# Patient Record
Sex: Female | Born: 1975 | Hispanic: Yes | Smoking: Light tobacco smoker
Health system: Southern US, Community
[De-identification: ages and names within clinical notes are randomized; demographics above are authoritative.]

## PROBLEM LIST (undated history)

## (undated) DIAGNOSIS — M869 Osteomyelitis, unspecified: Secondary | ICD-10-CM

## (undated) DIAGNOSIS — R0600 Dyspnea, unspecified: Secondary | ICD-10-CM

## (undated) DIAGNOSIS — H5440 Blindness, one eye, unspecified eye: Secondary | ICD-10-CM

## (undated) DIAGNOSIS — N289 Disorder of kidney and ureter, unspecified: Secondary | ICD-10-CM

## (undated) DIAGNOSIS — I10 Essential (primary) hypertension: Secondary | ICD-10-CM

## (undated) DIAGNOSIS — G4730 Sleep apnea, unspecified: Secondary | ICD-10-CM

## (undated) DIAGNOSIS — D649 Anemia, unspecified: Secondary | ICD-10-CM

## (undated) DIAGNOSIS — K219 Gastro-esophageal reflux disease without esophagitis: Secondary | ICD-10-CM

## (undated) DIAGNOSIS — F909 Attention-deficit hyperactivity disorder, unspecified type: Secondary | ICD-10-CM

## (undated) DIAGNOSIS — R911 Solitary pulmonary nodule: Secondary | ICD-10-CM

## (undated) DIAGNOSIS — M797 Fibromyalgia: Secondary | ICD-10-CM

## (undated) DIAGNOSIS — E119 Type 2 diabetes mellitus without complications: Secondary | ICD-10-CM

## (undated) DIAGNOSIS — J45909 Unspecified asthma, uncomplicated: Secondary | ICD-10-CM

## (undated) DIAGNOSIS — M1990 Unspecified osteoarthritis, unspecified site: Secondary | ICD-10-CM

## (undated) DIAGNOSIS — Q600 Renal agenesis, unilateral: Secondary | ICD-10-CM

## (undated) DIAGNOSIS — G629 Polyneuropathy, unspecified: Secondary | ICD-10-CM

## (undated) HISTORY — PX: URETER SURGERY: SHX823

## (undated) HISTORY — PX: APPENDECTOMY (OPEN): SHX54

## (undated) HISTORY — DX: Renal agenesis, unilateral: Q60.0

## (undated) HISTORY — PX: CHOLECYSTECTOMY: SHX55

## (undated) HISTORY — DX: Type 2 diabetes mellitus without complications: E11.9

## (undated) HISTORY — PX: BRAIN SURGERY: SHX531

## (undated) HISTORY — DX: Polyneuropathy, unspecified: G62.9

## (undated) HISTORY — PX: TUBAL LIGATION: SHX77

## (undated) HISTORY — PX: WISDOM TOOTH EXTRACTION: SHX21

## (undated) HISTORY — PX: APPENDECTOMY: SHX54

## (undated) HISTORY — PX: EYE SURGERY: SHX253

## (undated) HISTORY — PX: OTHER SURGICAL HISTORY: SHX169

## (undated) HISTORY — PX: TRIGGER FINGER RELEASE: SHX641

## (undated) HISTORY — PX: COLON SURGERY: SHX602

## (undated) HISTORY — PX: AV FISTULA PLACEMENT: SHX1204

---
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
## 1996-01-18 ENCOUNTER — Ambulatory Visit
Admission: RE | Admit: 1996-01-18 | Disposition: A | Discharge status: Home / Self Care | Payer: Self-pay | Source: Emergency Department | Admitting: Obstetrics & Gynecology

## 1996-01-19 ENCOUNTER — Ambulatory Visit (HOSPITAL_BASED_OUTPATIENT_CLINIC_OR_DEPARTMENT_OTHER)
Admit: 1996-01-19 | Disposition: A | Discharge status: Home / Self Care | Payer: Self-pay | Source: Ambulatory Visit | Admitting: Obstetrics & Gynecology

## 1996-01-26 ENCOUNTER — Inpatient Hospital Stay (HOSPITAL_BASED_OUTPATIENT_CLINIC_OR_DEPARTMENT_OTHER)
Admission: EM | Admit: 1996-01-26 | Disposition: A | Discharge status: Home / Self Care | Payer: Self-pay | Source: Ambulatory Visit | Admitting: Obstetrics & Gynecology

## 1996-03-16 ENCOUNTER — Ambulatory Visit (HOSPITAL_BASED_OUTPATIENT_CLINIC_OR_DEPARTMENT_OTHER)
Admit: 1996-03-16 | Disposition: A | Discharge status: Home / Self Care | Payer: Self-pay | Source: Ambulatory Visit | Admitting: Obstetrics & Gynecology

## 1996-04-19 ENCOUNTER — Inpatient Hospital Stay (HOSPITAL_BASED_OUTPATIENT_CLINIC_OR_DEPARTMENT_OTHER)
Admit: 1996-04-19 | Disposition: A | Discharge status: Home / Self Care | Payer: Self-pay | Source: Ambulatory Visit | Admitting: Obstetrics & Gynecology

## 1996-04-20 ENCOUNTER — Inpatient Hospital Stay (HOSPITAL_BASED_OUTPATIENT_CLINIC_OR_DEPARTMENT_OTHER)
Admission: RE | Admit: 1996-04-20 | Disposition: A | Discharge status: Home / Self Care | Payer: Self-pay | Source: Ambulatory Visit | Admitting: Obstetrics & Gynecology

## 1997-07-04 ENCOUNTER — Emergency Department: Admit: 1997-07-04 | Payer: Self-pay | Admitting: Emergency Medicine

## 1997-07-04 ENCOUNTER — Inpatient Hospital Stay
Admission: EM | Admit: 1997-07-04 | Disposition: A | Discharge status: Home / Self Care | Payer: Self-pay | Source: Emergency Department | Admitting: Specialist

## 1997-10-02 ENCOUNTER — Emergency Department: Admit: 1997-10-02 | Payer: Self-pay | Admitting: Surgery

## 1998-01-18 ENCOUNTER — Emergency Department: Admit: 1998-01-18 | Payer: Self-pay

## 1998-02-23 ENCOUNTER — Emergency Department: Admit: 1998-02-23 | Payer: Self-pay | Admitting: Emergency Medicine

## 1998-04-23 ENCOUNTER — Emergency Department: Admit: 1998-04-23 | Payer: Self-pay

## 1998-05-15 ENCOUNTER — Emergency Department: Admit: 1998-05-15 | Payer: Self-pay | Admitting: Emergency Medicine

## 1998-06-27 ENCOUNTER — Emergency Department: Admit: 1998-06-27 | Payer: Self-pay

## 1998-07-03 ENCOUNTER — Ambulatory Visit: Admit: 1998-07-03 | Disposition: A | Discharge status: Home / Self Care | Payer: Self-pay | Source: Ambulatory Visit

## 1998-07-10 ENCOUNTER — Inpatient Hospital Stay (INDEPENDENT_AMBULATORY_CARE_PROVIDER_SITE_OTHER): Admit: 1998-07-10 | Disposition: A | Discharge status: Home / Self Care | Payer: Self-pay | Source: Ambulatory Visit

## 1998-08-31 ENCOUNTER — Emergency Department: Admit: 1998-08-31 | Payer: Self-pay

## 1998-09-02 ENCOUNTER — Emergency Department: Admit: 1998-09-02 | Payer: Self-pay | Source: Emergency Department | Admitting: Pediatric Emergency Medicine

## 1998-10-08 ENCOUNTER — Ambulatory Visit
Admit: 1998-10-08 | Disposition: A | Discharge status: Home / Self Care | Payer: Self-pay | Admitting: Obstetrics & Gynecology

## 1998-11-23 ENCOUNTER — Emergency Department: Admit: 1998-11-23 | Payer: Self-pay | Admitting: Surgery

## 1998-11-28 ENCOUNTER — Ambulatory Visit
Admit: 1998-11-28 | Disposition: A | Discharge status: Home / Self Care | Payer: Self-pay | Admitting: Obstetrics & Gynecology

## 1998-12-15 ENCOUNTER — Ambulatory Visit
Admit: 1998-12-15 | Disposition: A | Discharge status: Home / Self Care | Payer: Self-pay | Admitting: Obstetrics & Gynecology

## 1998-12-24 ENCOUNTER — Ambulatory Visit
Admit: 1998-12-24 | Disposition: A | Discharge status: Home / Self Care | Payer: Self-pay | Admitting: Obstetrics & Gynecology

## 1998-12-28 ENCOUNTER — Ambulatory Visit
Admit: 1998-12-28 | Disposition: A | Discharge status: Home / Self Care | Payer: Self-pay | Admitting: Obstetrics & Gynecology

## 1999-01-03 ENCOUNTER — Ambulatory Visit
Admit: 1999-01-03 | Disposition: A | Discharge status: Home / Self Care | Payer: Self-pay | Admitting: Obstetrics & Gynecology

## 1999-01-07 ENCOUNTER — Ambulatory Visit
Admit: 1999-01-07 | Disposition: A | Discharge status: Home / Self Care | Payer: Self-pay | Admitting: Obstetrics & Gynecology

## 1999-01-12 ENCOUNTER — Ambulatory Visit
Admit: 1999-01-12 | Disposition: A | Discharge status: Home / Self Care | Payer: Self-pay | Admitting: Obstetrics & Gynecology

## 1999-01-14 ENCOUNTER — Ambulatory Visit
Admit: 1999-01-14 | Disposition: A | Discharge status: Home / Self Care | Payer: Self-pay | Admitting: Obstetrics & Gynecology

## 1999-01-17 ENCOUNTER — Inpatient Hospital Stay
Admit: 1999-01-17 | Disposition: A | Discharge status: Home / Self Care | Payer: Self-pay | Admitting: Obstetrics & Gynecology

## 1999-01-24 ENCOUNTER — Emergency Department: Admit: 1999-01-24 | Payer: Self-pay

## 1999-02-13 ENCOUNTER — Emergency Department: Admit: 1999-02-13 | Payer: Self-pay

## 1999-03-13 ENCOUNTER — Ambulatory Visit
Admit: 1999-03-13 | Disposition: A | Discharge status: Home / Self Care | Payer: Self-pay | Admitting: Obstetrics & Gynecology

## 1999-09-19 ENCOUNTER — Emergency Department: Admit: 1999-09-19 | Payer: Self-pay | Admitting: Emergency Medicine

## 2000-01-16 ENCOUNTER — Emergency Department: Admit: 2000-01-16 | Payer: Self-pay | Admitting: Emergency Medicine

## 2000-02-18 ENCOUNTER — Emergency Department: Admit: 2000-02-18 | Payer: Self-pay

## 2000-03-30 ENCOUNTER — Emergency Department: Admit: 2000-03-30 | Payer: Self-pay | Admitting: Emergency Medicine

## 2000-05-27 ENCOUNTER — Emergency Department: Admit: 2000-05-27 | Payer: Self-pay

## 2000-06-22 ENCOUNTER — Emergency Department: Admit: 2000-06-22 | Payer: Self-pay | Source: Emergency Department | Admitting: Emergency Medicine

## 2000-10-11 ENCOUNTER — Emergency Department: Admit: 2000-10-11 | Payer: Self-pay | Source: Emergency Department | Admitting: Emergency Medicine

## 2001-04-03 ENCOUNTER — Emergency Department: Admit: 2001-04-03 | Payer: Self-pay | Source: Emergency Department | Admitting: Emergency Medicine

## 2001-04-03 ENCOUNTER — Emergency Department: Admit: 2001-04-03 | Payer: Self-pay | Source: Emergency Department

## 2004-02-01 ENCOUNTER — Inpatient Hospital Stay
Admission: EM | Admit: 2004-02-01 | Disposition: A | Discharge status: Hospice | Payer: Self-pay | Source: Emergency Department | Admitting: Specialist

## 2006-12-08 ENCOUNTER — Emergency Department: Admit: 2006-12-08 | Payer: Self-pay | Source: Emergency Department | Admitting: Emergency Medicine

## 2006-12-09 LAB — MAGNESIUM: Magnesium: 1.3 mg/dL — ABNORMAL LOW (ref 1.6–2.3)

## 2006-12-09 LAB — BASIC METABOLIC PANEL
BUN: 19 mg/dL (ref 8–20)
BUN: 25 mg/dL — ABNORMAL HIGH (ref 8–20)
CO2: 21 mEq/L (ref 21–30)
CO2: 25 mEq/L (ref 21–30)
Calcium: 10 mg/dL (ref 8.6–10.2)
Calcium: 8.6 mg/dL (ref 8.6–10.2)
Chloride: 100 mEq/L (ref 98–107)
Chloride: 107 mEq/L (ref 98–107)
Creatinine: 0.5 mg/dL — ABNORMAL LOW (ref 0.6–1.5)
Creatinine: 0.6 mg/dL (ref 0.6–1.5)
Glucose: 336 mg/dL — ABNORMAL HIGH (ref 70–100)
Glucose: 488 mg/dL — ABNORMAL HIGH (ref 70–100)
Potassium: 3.9 mEq/L (ref 3.6–5.0)
Potassium: 4.2 mEq/L (ref 3.6–5.0)
Sodium: 135 mEq/L — ABNORMAL LOW (ref 136–146)
Sodium: 136 mEq/L (ref 136–146)

## 2006-12-09 LAB — URINALYSIS WITH MICROSCOPIC
Bilirubin, UA: NEGATIVE
Blood, UA: NEGATIVE
Glucose, UA: >=1000
Leukocyte Esterase, UA: NEGATIVE
Nitrite, UA: POSITIVE
Protein, UR: NEGATIVE
Specific Gravity UA POCT: 1.025 (ref 1.001–1.035)
Urine pH: 5.5 (ref 5.0–8.0)
Urobilinogen, UA: 0.2

## 2006-12-09 LAB — CBC WITH AUTO DIFFERENTIAL CERNER
Basophils Absolute: 0 /mm3 (ref 0.0–0.2)
Basophils Absolute: 0 /mm3 (ref 0.0–0.2)
Basophils: 0 % (ref 0–2)
Basophils: 0 % (ref 0–2)
Eosinophils Absolute: 0.3 /mm3 (ref 0.0–0.7)
Eosinophils Absolute: 0.3 /mm3 (ref 0.0–0.7)
Eosinophils: 4 % (ref 0–5)
Eosinophils: 4 % (ref 0–5)
Granulocytes Absolute: 3.4 /mm3 (ref 1.8–8.1)
Granulocytes Absolute: 3.7 /mm3 (ref 1.8–8.1)
Hematocrit: 36.9 % — ABNORMAL LOW (ref 37.0–47.0)
Hematocrit: 41.5 % (ref 37.0–47.0)
Hgb: 13 G/DL (ref 12.0–16.0)
Hgb: 14.7 G/DL (ref 12.0–16.0)
Lymphocytes Absolute: 3.7 /mm3 (ref 0.5–4.4)
Lymphocytes Absolute: 3.8 /mm3 (ref 0.5–4.4)
Lymphocytes: 46 % — ABNORMAL HIGH (ref 15–41)
Lymphocytes: 47 % — ABNORMAL HIGH (ref 15–41)
MCH: 29.7 PG (ref 28.0–32.0)
MCH: 30.1 PG (ref 28.0–32.0)
MCHC: 35.2 G/DL (ref 32.0–36.0)
MCHC: 35.3 G/DL (ref 32.0–36.0)
MCV: 84 FL (ref 80.0–100.0)
MCV: 85.6 FL (ref 80.0–100.0)
MPV: 7.8 FL (ref 7.4–10.4)
MPV: 8 FL (ref 7.4–10.4)
Monocytes Absolute: 0.4 /mm3 (ref 0.0–1.2)
Monocytes Absolute: 0.5 /mm3 (ref 0.0–1.2)
Monocytes: 5 % (ref 0–11)
Monocytes: 6 % (ref 0–11)
Neutrophils %: 42 % — ABNORMAL LOW (ref 52–75)
Neutrophils %: 45 % — ABNORMAL LOW (ref 52–75)
Platelets: 270 /mm3 (ref 140–400)
Platelets: 310 /mm3 (ref 140–400)
RBC: 4.31 /mm3 (ref 4.20–5.40)
RBC: 4.95 /mm3 (ref 4.20–5.40)
RDW: 12 % (ref 11.5–15.0)
RDW: 12.1 % (ref 11.5–15.0)
WBC: 8.1 /mm3 (ref 3.5–10.8)
WBC: 8.2 /mm3 (ref 3.5–10.8)

## 2006-12-09 LAB — ACETONE: Acetone Blood: 1:2 {titer}

## 2006-12-09 LAB — PHOSPHORUS: Phosphorus: 4.7 mg/dL — ABNORMAL HIGH (ref 2.5–4.5)

## 2006-12-09 LAB — GFR

## 2006-12-09 LAB — HCG QUANTITATIVE LEVEL - FH CERNER: TUMOR MARKER BHCG QUANT: 0 m[IU]/mL

## 2008-03-22 ENCOUNTER — Emergency Department: Admit: 2008-03-22 | Payer: Self-pay | Source: Emergency Department

## 2011-09-06 LAB — ECG 12-LEAD
Atrial Rate: 72 {beats}/min
P Axis: 69 degrees
P-R Interval: 178 ms
Q-T Interval: 406 ms
QRS Duration: 90 ms
QTC Calculation (Bezet): 444 ms
R Axis: 36 degrees
T Axis: 60 degrees
Ventricular Rate: 72 {beats}/min

## 2012-12-03 NOTE — Procedures (Unsigned)
PATIENT NAME:  Caroline Christensen, Caroline Christensen            MED REC NO:      [id number]      ORDERING MD:   Lavonna Rua, MD             EXAM DATE:       02/03/2004      DICTATING MD:  Carma Leaven, MD          ACCOUNT NO:     [id number]      PATIENT DOB:     1976-07-30                  PATIENT LOC:     [id number]                  EXAMINATION: M-MODE, 2-D, DOPPLER AND COLOR FLOW DOPPLER ECHOCARDIOGRAM.            CLINICAL INDICATION: Heart murmur, diabetes.            INTERPRETATION: Dimensions: RV 2.2 cm. Aorta 2.9 cm. ACS 1.8 cm. LA 2.9 cm.      LVIDD 3.9 cm. LVIDS 2.9 cm. IVS 1 cm. LVPW 1 cm. LV ejection fraction 60%.            Comments      1.   Normal LV dimension in systole and diastole. Wall thickness is normal.      There is no wall motion abnormality. EF is normal at 60%.      2.   The right ventricular dimension is normal with mild tricuspid      regurgitation. Normal pulmonary artery pressure.      3.   Anatomically normal mitral valve with trace mitral regurgitation.      Normal left atrial dimension.      4.   Aortic valve anatomically and functionally intact.            FINAL IMPRESSION/ASSESSMENT: Essentially normal echo-Doppler study.                        __________________________________      Carma Leaven, MD            [id number]      D: 02/03/2004  3:10 P      T: 02/05/2004 11:17 A      J: [id number]      N: [id number]      CC:   Carma Leaven, MD

## 2015-04-02 ENCOUNTER — Emergency Department
Admission: EM | Admit: 2015-04-02 | Discharge: 2015-04-02 | Disposition: A | Discharge status: Home / Self Care | Payer: Medicaid HMO | Attending: Emergency Medical Services | Admitting: Emergency Medical Services

## 2015-04-02 ENCOUNTER — Emergency Department: Payer: Self-pay

## 2015-04-02 ENCOUNTER — Emergency Department: Payer: Medicaid HMO

## 2015-04-02 DIAGNOSIS — E1140 Type 2 diabetes mellitus with diabetic neuropathy, unspecified: Secondary | ICD-10-CM | POA: Insufficient documentation

## 2015-04-02 DIAGNOSIS — N390 Urinary tract infection, site not specified: Secondary | ICD-10-CM | POA: Insufficient documentation

## 2015-04-02 DIAGNOSIS — E1169 Type 2 diabetes mellitus with other specified complication: Secondary | ICD-10-CM

## 2015-04-02 DIAGNOSIS — E119 Type 2 diabetes mellitus without complications: Secondary | ICD-10-CM | POA: Insufficient documentation

## 2015-04-02 DIAGNOSIS — Z590 Homelessness: Secondary | ICD-10-CM | POA: Insufficient documentation

## 2015-04-02 DIAGNOSIS — Z794 Long term (current) use of insulin: Secondary | ICD-10-CM | POA: Insufficient documentation

## 2015-04-02 DIAGNOSIS — M79671 Pain in right foot: Secondary | ICD-10-CM | POA: Insufficient documentation

## 2015-04-02 DIAGNOSIS — F17210 Nicotine dependence, cigarettes, uncomplicated: Secondary | ICD-10-CM | POA: Insufficient documentation

## 2015-04-02 LAB — URINALYSIS, REFLEX TO MICROSCOPIC EXAM IF INDICATED
Bilirubin, UA: NEGATIVE
Glucose, UA: >=500 — AB
Ketones UA: NEGATIVE
Nitrite, UA: NEGATIVE
Protein, UR: >=500 — AB
Specific Gravity UA: 1.015 (ref 1.001–1.035)
Urine pH: 6 (ref 5.0–8.0)
Urobilinogen, UA: NORMAL mg/dL

## 2015-04-02 LAB — CBC AND DIFFERENTIAL
Hematocrit: 29 % — ABNORMAL LOW (ref 37.0–47.0)
Hgb: 10.2 g/dL — ABNORMAL LOW (ref 12.0–16.0)
MCH: 27.7 pg — ABNORMAL LOW (ref 28.0–32.0)
MCHC: 35.2 g/dL (ref 32.0–36.0)
MCV: 78.8 fL — ABNORMAL LOW (ref 80.0–100.0)
MPV: 9.9 fL (ref 9.4–12.3)
Platelets: 328 10*3/uL (ref 140–400)
RBC: 3.68 10*6/uL — ABNORMAL LOW (ref 4.20–5.40)
RDW: 14 % (ref 12–15)
WBC: 12.56 10*3/uL — ABNORMAL HIGH (ref 3.50–10.80)

## 2015-04-02 LAB — MAN DIFF ONLY
Band Neutrophils Absolute: 0.75 10*3/uL (ref 0.00–1.00)
Band Neutrophils: 6 %
Basophils Absolute Manual: 0.38 10*3/uL — ABNORMAL HIGH (ref 0.00–0.20)
Basophils Manual: 3 %
Eosinophils Absolute Manual: 0.25 10*3/uL (ref 0.00–0.70)
Eosinophils Manual: 2 %
Lymphocytes Absolute Manual: 3.27 10*3/uL (ref 0.50–4.40)
Lymphocytes Manual: 26 %
Monocytes Absolute: 0.63 10*3/uL (ref 0.00–1.20)
Monocytes Manual: 5 %
Neutrophils Absolute Manual: 7.28 10*3/uL (ref 1.80–8.10)
Nucleated RBC: 0 /100 WBC (ref 0–1)
Segmented Neutrophils: 58 %

## 2015-04-02 LAB — GFR: EGFR: >60

## 2015-04-02 LAB — BASIC METABOLIC PANEL
Anion Gap: 7 (ref 5.0–15.0)
BUN: 26 mg/dL — ABNORMAL HIGH (ref 7–19)
CO2: 25 mEq/L (ref 22–29)
Calcium: 8.8 mg/dL (ref 8.5–10.5)
Chloride: 97 mEq/L — ABNORMAL LOW (ref 100–111)
Creatinine: 1 mg/dL (ref 0.6–1.0)
Glucose: 536 mg/dL (ref 70–100)
Potassium: 4.1 mEq/L (ref 3.5–5.1)
Sodium: 129 mEq/L — ABNORMAL LOW (ref 136–145)

## 2015-04-02 LAB — GLUCOSE WHOLE BLOOD - POCT
Whole Blood Glucose POCT: 439 mg/dL — ABNORMAL HIGH (ref 70–100)
Whole Blood Glucose POCT: 343 mg/dL — ABNORMAL HIGH (ref 70–100)

## 2015-04-02 LAB — CELL MORPHOLOGY
Cell Morphology: NORMAL
Platelet Estimate: ADEQUATE

## 2015-04-02 MED ORDER — CIPROFLOXACIN HCL 500 MG PO TABS
500.0000 mg | ORAL_TABLET | Freq: Once | ORAL | Status: AC
Start: 2015-04-02 — End: 2015-04-02
  Administered 2015-04-02: 500 mg via ORAL
  Filled 2015-04-02: qty 1

## 2015-04-02 MED ORDER — ONDANSETRON 4 MG PO TBDP
4.0000 mg | ORAL_TABLET | Freq: Three times a day (TID) | ORAL | Status: AC | PRN
Start: 2015-04-02 — End: ?

## 2015-04-02 MED ORDER — CIPROFLOXACIN HCL 500 MG PO TABS
500.0000 mg | ORAL_TABLET | Freq: Two times a day (BID) | ORAL | Status: AC
Start: 2015-04-02 — End: 2015-04-09

## 2015-04-02 MED ORDER — SODIUM CHLORIDE 0.9 % IV BOLUS
1000.0000 mL | Freq: Once | INTRAVENOUS | Status: AC
Start: 2015-04-02 — End: 2015-04-02
  Administered 2015-04-02: 1000 mL via INTRAVENOUS

## 2015-04-02 MED ORDER — ONDANSETRON HCL 4 MG/2ML IJ SOLN
4.0000 mg | Freq: Once | INTRAMUSCULAR | Status: AC
Start: 2015-04-02 — End: 2015-04-02
  Administered 2015-04-02: 4 mg via INTRAVENOUS
  Filled 2015-04-02: qty 2

## 2015-04-02 MED ORDER — INSULIN REGULAR HUMAN 100 UNIT/ML IJ SOLN
10.0000 [IU] | Freq: Once | INTRAMUSCULAR | Status: AC
Start: 2015-04-02 — End: 2015-04-02
  Administered 2015-04-02: 10 [IU] via SUBCUTANEOUS
  Filled 2015-04-02: qty 6

## 2015-04-02 MED ORDER — HYDROMORPHONE HCL 1 MG/ML IJ SOLN
1.0000 mg | Freq: Once | INTRAMUSCULAR | Status: AC
Start: 2015-04-02 — End: 2015-04-02
  Administered 2015-04-02: 1 mg via INTRAVENOUS
  Filled 2015-04-02: qty 1

## 2015-04-02 MED ORDER — OXYCODONE-ACETAMINOPHEN 5-325 MG PO TABS
ORAL_TABLET | ORAL | Status: DC
Start: 2015-04-02 — End: 2018-03-19

## 2015-04-02 NOTE — ED Provider Notes (Signed)
Physician/Midlevel provider first contact with patient: 04/02/15 0047         History     Chief Complaint   Patient presents with   . Foot Pain     HPI patient complains of persistent right heel pain times this morning.  Patient states she does have history of diabetic neuropathy but has noted episodic sharp shooting pain from the heel that radiates to her ankle.  Patient states that she does not check her diabetes and last sugar check was approximately 1-1/2 month ago.  Patient denies any vomiting.  No diarrhea.  Patient denies any Changes to urinary habitus.  Patient denies any headache.  No chest pain.  No abdominal pain.  Patient denies any nausea.  Patient denies any back pain.  Patient denies any changes to her numbness and tingling sensation related to  diabetic neuropathy.  Patient denies any history of trauma to the foot.  Patient states that she has been diagnosed with type 2 diabetes since age 17 but had difficulty having a persistent medical treatment secondary to patient's social situation / being homeless and also states that patient reacts "badly to insulin and always need to get pain medications prior to being on insulin."  Patient states that she has been informed by endocrinologist that she would need a continuous ambulatory care for evaluation of her diabetes but patient states that she has not been able to have the primary care secondary to lack of home.  Patient states that she has not evaluated with diabetes on November 2015.  Patient states that she is currently having her monthly cycle denies any heavy bleeding.  Patient denies any blood in her stool.  Patient denies any other complaints.        Past Medical History   Diagnosis Date   . Diabetes mellitus    . Neuropathy    . Kidney congenitally absent, right        Past Surgical History   Procedure Laterality Date   . Brain surgery     . Ureter surgery     . Tubal ligation     . Cholecystectomy     . Appendectomy         History reviewed. No  pertinent family history.    Social  History   Substance Use Topics   . Smoking status: Light Tobacco Smoker   . Smokeless tobacco: Never Used   . Alcohol Use: No       .     Allergies   Allergen Reactions   . Insulin Aspart        Home Medications     Last Medication Reconciliation Action:  In Progress Lenox Ahr, RN 04/02/2015 12:45 AM          No Medications           Review of Systems   Constitutional: Negative for fever, chills, activity change and unexpected weight change.   HENT: Negative for trouble swallowing.    Eyes: Negative for photophobia, pain and visual disturbance.   Respiratory: Negative for shortness of breath, wheezing and stridor.    Cardiovascular: Negative for chest pain.   Gastrointestinal: Negative for nausea, vomiting, abdominal pain and diarrhea.   Endocrine: Negative for polyuria.   Musculoskeletal: Negative for myalgias.   Neurological: Negative for light-headedness and headaches.   All other systems reviewed and are negative.      Physical Exam    BP: 142/90 mmHg, Heart Rate: 72, Temp: 98 F (  36.7 C), Resp Rate: 18, SpO2: 100 %, Weight: 62.596 kg    Physical Exam   Constitutional: She is oriented to person, place, and time. She appears well-developed and well-nourished. No distress.   HENT:   Head: Normocephalic and atraumatic.   Right Ear: External ear normal.   Left Ear: External ear normal.   Eyes: Conjunctivae and EOM are normal. Pupils are equal, round, and reactive to light.   Neck: Normal range of motion. Neck supple.   Cardiovascular: Normal rate and normal heart sounds.    Pulmonary/Chest: Effort normal and breath sounds normal. No respiratory distress.   Abdominal: Soft. Bowel sounds are normal. There is no tenderness.   Musculoskeletal: Normal range of motion.   Her all well-healing wound noted to the dorsal aspect of the right foot at the midfoot region.  No sign infectious.  Normal range of motion is intact.  Lateral aspect of the foot is without any open wound noted  ecchymosis noted hematoma no edema noted erythema.  Slightly tender to palpation to the calcaneus region.  Nontender to palpation to the Achilles tendon.  Nontender to palpation to the base of 5th metatarsal.  No open wounds noted.   Neurological: She is alert and oriented to person, place, and time. No cranial nerve deficit.   Vitals reviewed.        MDM and ED Course     ED Medication Orders     Start Ordered     Status Ordering Provider    04/02/15 0322 04/02/15 0321  ciprofloxacin (CIPRO) tablet 500 mg   Once     Route: Oral  Ordered Dose: 500 mg     Ordered Jacqualynn Parco JIN    04/02/15 0151 04/02/15 0150  insulin regular (HumuLIN R,NovoLIN R) injection 10 Units   Once     Route: Subcutaneous  Ordered Dose: 10 Units     Last MAR action:  Given Addison Whidbee JIN    04/02/15 0151 04/02/15 0150  sodium chloride 0.9 % bolus 1,000 mL   Once     Route: Intravenous  Ordered Dose: 1,000 mL     Last MAR action:  New Bag Malay Fantroy JIN    04/02/15 0151 04/02/15 0150  HYDROmorphone (DILAUDID) injection 1 mg   Once     Route: Intravenous  Ordered Dose: 1 mg     Last MAR action:  Given Zaidan Keeble JIN    04/02/15 0151 04/02/15 0150  ondansetron (ZOFRAN) injection 4 mg   Once     Route: Intravenous  Ordered Dose: 4 mg     Last MAR action:  Given Shailyn Weyandt, Melanie Crazier              MDM  discussed with patient extensively regarding to treatment plan.  Patient agrees to have the right foot x-ray.  Patient also agrees to have the fingerstick glucose check.  Patient is informed the results of the fingerstick glucose demonstrating elevated blood sugar will require treatment the subcutaneous insulin and also IV hydration will also provide pain medication.  Patient denies any ALLERGIES to insulin but states that she is able to take insulin if given pain medication at same time.    At 3:29 AM patient states she feels much better at this time.  Patient's blood sugar has been reduced with use of subcutaneous insulin and also IV infusions.  Patient's  urinalysis also indicates urinary tract infections.  Patient was given the 1st dose of Cipro otherwise patient is  also given pain medication Percocet and strongly recommended to follow-up with the free clinic in one to two days and also was given the information with the social worker to getting contact for further facilitation of a care and treatment of her diabetes.  Patient is advised to return to emergency department immediately if symptoms worse or changes.  Patient states understanding and agrees to treatment.       Procedures    Clinical Impression & Disposition     Clinical Impression  Final diagnoses:   Foot pain, right   Acute UTI (urinary tract infection)   Type 2 diabetes mellitus with other specified complication        ED Disposition     Discharge [facility] discharge to home/self care.    Condition at disposition: Stable             New Prescriptions    CIPROFLOXACIN (CIPRO) 500 MG TABLET    Take 1 tablet (500 mg total) by mouth 2 (two) times daily.    ONDANSETRON (ZOFRAN ODT) 4 MG DISINTEGRATING TABLET    Take 1 tablet (4 mg total) by mouth every 8 (eight) hours as needed.    OXYCODONE-ACETAMINOPHEN (PERCOCET) 5-325 MG PER TABLET    1-2 tablets by mouth every 4-6 hours as needed for pain;  Do not drive or operate machinery while taking this medicine                   Abbey Chatters, PA  04/02/15 0330    Hoyle Barr [place], [place]  04/02/15 0330

## 2015-04-26 DIAGNOSIS — R011 Cardiac murmur, unspecified: Secondary | ICD-10-CM

## 2015-04-27 DIAGNOSIS — Z9889 Other specified postprocedural states: Secondary | ICD-10-CM | POA: Insufficient documentation

## 2015-04-27 DIAGNOSIS — Q600 Renal agenesis, unilateral: Secondary | ICD-10-CM | POA: Insufficient documentation

## 2015-12-03 HISTORY — PX: UPPER GI ENDOSCOPY: SHX6162

## 2016-09-01 HISTORY — PX: ABDOMINAL HYSTERECTOMY: SHX81

## 2017-10-10 DIAGNOSIS — J90 Pleural effusion, not elsewhere classified: Secondary | ICD-10-CM | POA: Insufficient documentation

## 2017-10-12 DIAGNOSIS — B9562 Methicillin resistant Staphylococcus aureus infection as the cause of diseases classified elsewhere: Secondary | ICD-10-CM | POA: Insufficient documentation

## 2017-10-12 DIAGNOSIS — R7881 Bacteremia: Secondary | ICD-10-CM | POA: Insufficient documentation

## 2017-10-22 DIAGNOSIS — R112 Nausea with vomiting, unspecified: Secondary | ICD-10-CM | POA: Insufficient documentation

## 2017-10-22 DIAGNOSIS — N10 Acute tubulo-interstitial nephritis: Secondary | ICD-10-CM | POA: Insufficient documentation

## 2017-10-22 DIAGNOSIS — R109 Unspecified abdominal pain: Secondary | ICD-10-CM | POA: Insufficient documentation

## 2017-10-22 HISTORY — DX: Nausea with vomiting, unspecified: R11.2

## 2018-01-16 DIAGNOSIS — J810 Acute pulmonary edema: Secondary | ICD-10-CM | POA: Insufficient documentation

## 2018-01-16 DIAGNOSIS — J189 Pneumonia, unspecified organism: Secondary | ICD-10-CM | POA: Insufficient documentation

## 2018-01-16 DIAGNOSIS — Z8614 Personal history of Methicillin resistant Staphylococcus aureus infection: Secondary | ICD-10-CM | POA: Insufficient documentation

## 2018-01-16 DIAGNOSIS — Z8679 Personal history of other diseases of the circulatory system: Secondary | ICD-10-CM | POA: Insufficient documentation

## 2018-01-16 DIAGNOSIS — Z992 Dependence on renal dialysis: Secondary | ICD-10-CM

## 2018-01-16 DIAGNOSIS — E875 Hyperkalemia: Secondary | ICD-10-CM | POA: Insufficient documentation

## 2018-01-16 DIAGNOSIS — N179 Acute kidney failure, unspecified: Secondary | ICD-10-CM | POA: Insufficient documentation

## 2018-04-13 DIAGNOSIS — E8770 Fluid overload, unspecified: Secondary | ICD-10-CM | POA: Insufficient documentation

## 2018-04-28 DIAGNOSIS — N2581 Secondary hyperparathyroidism of renal origin: Secondary | ICD-10-CM | POA: Insufficient documentation

## 2018-04-28 DIAGNOSIS — Z87441 Personal history of nephrotic syndrome: Secondary | ICD-10-CM | POA: Insufficient documentation

## 2018-04-28 DIAGNOSIS — N186 End stage renal disease: Secondary | ICD-10-CM | POA: Insufficient documentation

## 2018-04-28 DIAGNOSIS — D631 Anemia in chronic kidney disease: Secondary | ICD-10-CM | POA: Insufficient documentation

## 2018-04-28 DIAGNOSIS — D509 Iron deficiency anemia, unspecified: Secondary | ICD-10-CM | POA: Insufficient documentation

## 2018-04-28 DIAGNOSIS — N189 Chronic kidney disease, unspecified: Secondary | ICD-10-CM | POA: Insufficient documentation

## 2018-05-07 ENCOUNTER — Telehealth: Payer: Self-pay | Admitting: *Deleted

## 2018-05-07 ENCOUNTER — Ambulatory Visit (INDEPENDENT_AMBULATORY_CARE_PROVIDER_SITE_OTHER): Payer: Medicaid Other

## 2018-05-07 ENCOUNTER — Ambulatory Visit: Payer: Medicaid Other | Admitting: Podiatry

## 2018-05-07 DIAGNOSIS — Z87440 Personal history of urinary (tract) infections: Secondary | ICD-10-CM | POA: Insufficient documentation

## 2018-05-07 DIAGNOSIS — L97529 Non-pressure chronic ulcer of other part of left foot with unspecified severity: Principal | ICD-10-CM

## 2018-05-07 DIAGNOSIS — E1122 Type 2 diabetes mellitus with diabetic chronic kidney disease: Secondary | ICD-10-CM

## 2018-05-07 DIAGNOSIS — M79676 Pain in unspecified toe(s): Secondary | ICD-10-CM | POA: Diagnosis not present

## 2018-05-07 DIAGNOSIS — IMO0001 Reserved for inherently not codable concepts without codable children: Secondary | ICD-10-CM | POA: Insufficient documentation

## 2018-05-07 DIAGNOSIS — B351 Tinea unguium: Secondary | ICD-10-CM

## 2018-05-07 DIAGNOSIS — L97522 Non-pressure chronic ulcer of other part of left foot with fat layer exposed: Secondary | ICD-10-CM | POA: Diagnosis not present

## 2018-05-07 DIAGNOSIS — R0602 Shortness of breath: Secondary | ICD-10-CM | POA: Insufficient documentation

## 2018-05-07 DIAGNOSIS — E11621 Type 2 diabetes mellitus with foot ulcer: Secondary | ICD-10-CM

## 2018-05-07 DIAGNOSIS — N186 End stage renal disease: Secondary | ICD-10-CM | POA: Insufficient documentation

## 2018-05-07 DIAGNOSIS — Z794 Long term (current) use of insulin: Secondary | ICD-10-CM

## 2018-05-07 DIAGNOSIS — Z992 Dependence on renal dialysis: Secondary | ICD-10-CM | POA: Insufficient documentation

## 2018-05-07 DIAGNOSIS — E119 Type 2 diabetes mellitus without complications: Secondary | ICD-10-CM | POA: Insufficient documentation

## 2018-05-07 DIAGNOSIS — I739 Peripheral vascular disease, unspecified: Secondary | ICD-10-CM

## 2018-05-07 MED ORDER — COLLAGENASE 250 UNIT/GM EX OINT: 1.0000 "application " | TOPICAL_OINTMENT | Freq: Every day | CUTANEOUS | 5 refills | Status: DC

## 2018-05-07 NOTE — Progress Notes (Signed)
  Subjective:  Patient ID: Karen [name], female    DOB: Jun 22, 1976,  MRN: [id number]  Chief Complaint  Patient presents with  . Diabetes    diabetic foot exam  . Diabetic Ulcer    top of first metatarsal left foot - started as a callus 6 months ago   42 y.o. female presents for wound care. Started as a callus 6 months ago. Has had issues with ulcers before. States it healed up and reopened. [place] moved here from [place]. ESRD on dialysis.  Denies N/V/F/Ch.  No past medical history on file.  Current Outpatient Medications:  .  epoetin alfa (EPOGEN) 10000 UNIT/ML injection, Inject into the skin., Disp: , Rfl:  .  gabapentin (NEURONTIN) 100 MG capsule, Take by mouth., Disp: , Rfl:  .  hydrALAZINE (APRESOLINE) 100 MG tablet, Take by mouth., Disp: , Rfl:  .  ondansetron (ZOFRAN) 4 MG tablet, Take by mouth., Disp: , Rfl:  .  amLODipine (NORVASC) 10 MG tablet, Take by mouth., Disp: , Rfl:  .  B Complex-C-Folic Acid (RENA-VITE PO), Take by mouth., Disp: , Rfl:  .  calcium acetate (PHOSLO) 667 MG capsule, Take by mouth., Disp: , Rfl:  .  Cholecalciferol (VITAMIN D-1000 MAX ST) 1000 units tablet, Take by mouth., Disp: , Rfl:  .  famotidine (PEPCID) 20 MG tablet, Take by mouth., Disp: , Rfl:  .  insulin aspart (NOVOLOG) 100 UNIT/ML injection, Inject into the skin., Disp: , Rfl:  .  insulin glargine (LANTUS) 100 UNIT/ML injection, Inject into the skin., Disp: , Rfl:  .  labetalol (NORMODYNE) 200 MG tablet, Take by mouth., Disp: , Rfl:  .  losartan (COZAAR) 100 MG tablet, Take by mouth., Disp: , Rfl:   No Known Allergies   Objective:  There were no vitals filed for this visit. General AA&O x3. Normal mood and affect.  Vascular Foot warm to touch.  Neurologic Sensation grossly diminished.  Dermatologic (Wound) Wound Location: L 1st MPJ dorsal Wound Measurement: 1.5x1 pre, 2x1 post Wound Base: Mixed Granular/Fibrotic Peri-wound: Macerated Exudate: Scant/small amount Serous  exudate    Nails x10 elongated and dystrophic  Orthopedic: No pain to palpation either foot.   Assessment & Plan:  Patient was evaluated and treated and all questions answered.  Ulcer L 1st MPJ -Debridement as below. -Dressed with medihoney, DSD. -Concern for possible underlying arterial cause. Order for ABIs. -[facility] for Santyl WTD. Santyl ordered.  Procedure: Excisional Debridement of Wound Rationale: Removal of non-viable soft tissue from the wound to promote healing.  Anesthesia: none Pre-Debridement Wound Measurements: 1.5 cm x 0 cm x 0.2 cm  Post-Debridement Wound Measurements: 2 cm x 1 cm x 0.2 cm  Type of Debridement: Excisional Tissue Removed: Non-viable soft tissue Depth of Debridement: subq Instrumentation: 312 blade. Technique: Sharp excisional debridement to bleeding, viable wound base.  Dressing: Dry, sterile, compression dressing. Disposition: Patient tolerated procedure well. Patient to return in 1 week for follow-up.  No follow-ups on file.

## 2018-05-07 NOTE — Telephone Encounter (Signed)
Faxed required form, Dr. Eleanora Neighbor orders for santyl and wet to dry dressings to left 1st MPJ 3 x week, clinicals and demographics to [facility]. Faxed required form to [facility] for [facility]. Faxed orders for ABI with TBI to [facility].

## 2018-05-07 NOTE — Telephone Encounter (Signed)
[place] AUTHORIZATION# [id number], VALID 05/07/2018 END DATE 06/06/2018.

## 2018-05-08 ENCOUNTER — Telehealth: Payer: Self-pay | Admitting: Podiatry

## 2018-05-08 NOTE — Telephone Encounter (Signed)
This is [place] with [facility]. Calling about the referral we got yesterday on [place]. Wanted to discuss the wound care orders please and confirm that it is wet to dry normal saline dressing to do that. If you could give [place] a call please at [phone]. Thanks so much and have a great day.

## 2018-05-08 NOTE — Telephone Encounter (Signed)
Confirmed santlyl WTD orders

## 2018-05-11 ENCOUNTER — Telehealth: Payer: Self-pay | Admitting: *Deleted

## 2018-05-11 ENCOUNTER — Other Ambulatory Visit: Payer: Self-pay | Admitting: Podiatry

## 2018-05-11 DIAGNOSIS — I739 Peripheral vascular disease, unspecified: Secondary | ICD-10-CM

## 2018-05-11 NOTE — Telephone Encounter (Signed)
Pt will be receiving service from [facility] on 05/14/2018. They just wanted to send an [place]

## 2018-05-11 NOTE — Telephone Encounter (Signed)
Karen [name] - [facility] states pt requested to start [facility] 05/14/2018 instead of today due to dialysis.

## 2018-05-12 DIAGNOSIS — E559 Vitamin D deficiency, unspecified: Secondary | ICD-10-CM | POA: Insufficient documentation

## 2018-05-12 DIAGNOSIS — K861 Other chronic pancreatitis: Secondary | ICD-10-CM | POA: Insufficient documentation

## 2018-05-13 ENCOUNTER — Ambulatory Visit (HOSPITAL_COMMUNITY)
Admission: RE | Admit: 2018-05-13 | Discharge: 2018-05-13 | Disposition: A | Discharge status: Home / Self Care | Payer: Medicare Other | Source: Ambulatory Visit | Attending: Cardiology | Admitting: Cardiology

## 2018-05-13 DIAGNOSIS — E11621 Type 2 diabetes mellitus with foot ulcer: Secondary | ICD-10-CM | POA: Insufficient documentation

## 2018-05-13 DIAGNOSIS — L97529 Non-pressure chronic ulcer of other part of left foot with unspecified severity: Secondary | ICD-10-CM | POA: Insufficient documentation

## 2018-05-13 DIAGNOSIS — E1151 Type 2 diabetes mellitus with diabetic peripheral angiopathy without gangrene: Secondary | ICD-10-CM | POA: Diagnosis not present

## 2018-05-13 DIAGNOSIS — I739 Peripheral vascular disease, unspecified: Secondary | ICD-10-CM | POA: Diagnosis not present

## 2018-05-13 DIAGNOSIS — R0989 Other specified symptoms and signs involving the circulatory and respiratory systems: Secondary | ICD-10-CM | POA: Diagnosis present

## 2018-05-14 ENCOUNTER — Telehealth: Payer: Self-pay | Admitting: Podiatry

## 2018-05-14 DIAGNOSIS — L97522 Non-pressure chronic ulcer of other part of left foot with fat layer exposed: Secondary | ICD-10-CM | POA: Diagnosis not present

## 2018-05-14 DIAGNOSIS — E11621 Type 2 diabetes mellitus with foot ulcer: Secondary | ICD-10-CM | POA: Diagnosis not present

## 2018-05-14 NOTE — Telephone Encounter (Signed)
I informed Scott, RN - [facility] that pt's family performing wound care and [facility]  perform on Tuesday and Thursday.

## 2018-05-14 NOTE — Telephone Encounter (Signed)
This is [place], RN with [facility]. I'm calling for skilled nurse orders to see her for wound care to her left open foot sore. We would like one time this week then twice a week for eight weeks. I know that Dr. March Rummage had written for three times a week but the pt has dialysis on Monday, Wednesday, and Friday's. She has family members who are willing to do the dressing over the weekend. So, if we could see her on Tuesday's and Thursday's she thinks that would probably be good enough as a family member could do it on the weekend. You can reach me at [phone]. Thank you.

## 2018-05-21 ENCOUNTER — Other Ambulatory Visit: Payer: Self-pay

## 2018-05-21 ENCOUNTER — Ambulatory Visit: Payer: Medicaid Other | Admitting: Podiatry

## 2018-05-21 ENCOUNTER — Encounter: Payer: Self-pay | Admitting: Podiatry

## 2018-05-21 VITALS — BP 101/50 | HR 73

## 2018-05-21 DIAGNOSIS — L97521 Non-pressure chronic ulcer of other part of left foot limited to breakdown of skin: Secondary | ICD-10-CM | POA: Diagnosis not present

## 2018-05-21 DIAGNOSIS — T82510A Breakdown (mechanical) of surgically created arteriovenous fistula, initial encounter: Secondary | ICD-10-CM

## 2018-05-21 DIAGNOSIS — S90821A Blister (nonthermal), right foot, initial encounter: Secondary | ICD-10-CM

## 2018-05-21 DIAGNOSIS — E11621 Type 2 diabetes mellitus with foot ulcer: Secondary | ICD-10-CM

## 2018-05-21 DIAGNOSIS — L089 Local infection of the skin and subcutaneous tissue, unspecified: Secondary | ICD-10-CM

## 2018-05-21 DIAGNOSIS — L97529 Non-pressure chronic ulcer of other part of left foot with unspecified severity: Principal | ICD-10-CM

## 2018-05-24 NOTE — Progress Notes (Signed)
  Subjective:  Patient ID: Karen [name], female    DOB: [birth year],  MRN: [id number]  Chief Complaint  Patient presents with  . Foot Ulcer    B/L feet,left top of foot is doing better and healing,right one has developed,pt. unaware    41 y.o. female returns for wound care. Believes the wound to be improving. Denies N/V/F/Ch.  Concern for new area of breakdown of the right foot under the fifth toe thinks that it could be blistered.  Objective:   Vitals:   05/21/18 1004  BP: (!) 101/50  Pulse: 73   General AA&O x3. Normal mood and affect.  Vascular Foot warm to touch.  Neurologic Sensation grossly diminished.  Dermatologic (Wound) Wound Location: Left first MP Wound Measurement: 1x1 Wound Base: Granular/Healthy Peri-wound: Normal Exudate: none     3 x 1 blister plantar right foot  Orthopedic: No pain to palpation either foot.   Assessment & Plan:  Patient was evaluated and treated and all questions answered.  Ulcer L 1st MPJ -No debridement -Dressed with medihoney, DSD.   Blister right fifth MPJ -Blister deroofed with 15 blade. -Medihoney applied  Return in about 2 weeks (around 06/04/2018) for Wound Care, Bilateral.

## 2018-05-27 ENCOUNTER — Telehealth: Payer: Self-pay | Admitting: Podiatry

## 2018-05-27 NOTE — Telephone Encounter (Signed)
This is Optician, dispensing from [employer]. I need to get the two office visit notes on [place] to give to the rounding doctor. If you could please fax those to me at [phone]. Thank you.

## 2018-05-28 ENCOUNTER — Encounter (HOSPITAL_COMMUNITY): Payer: Medicaid Other

## 2018-05-29 ENCOUNTER — Ambulatory Visit (INDEPENDENT_AMBULATORY_CARE_PROVIDER_SITE_OTHER): Payer: Medicaid Other

## 2018-05-29 ENCOUNTER — Ambulatory Visit: Payer: Medicaid Other | Admitting: Podiatry

## 2018-05-29 ENCOUNTER — Telehealth: Payer: Self-pay | Admitting: Podiatry

## 2018-05-29 VITALS — Temp 98.4°F

## 2018-05-29 DIAGNOSIS — E11621 Type 2 diabetes mellitus with foot ulcer: Secondary | ICD-10-CM | POA: Diagnosis not present

## 2018-05-29 DIAGNOSIS — E08621 Diabetes mellitus due to underlying condition with foot ulcer: Secondary | ICD-10-CM

## 2018-05-29 DIAGNOSIS — L97511 Non-pressure chronic ulcer of other part of right foot limited to breakdown of skin: Secondary | ICD-10-CM

## 2018-05-29 DIAGNOSIS — L97529 Non-pressure chronic ulcer of other part of left foot with unspecified severity: Secondary | ICD-10-CM

## 2018-05-29 MED ORDER — CLINDAMYCIN HCL 150 MG PO CAPS: 150.0000 mg | ORAL_CAPSULE | Freq: Two times a day (BID) | ORAL | 0 refills | Status: DC

## 2018-05-29 MED ORDER — CIPROFLOXACIN HCL 250 MG PO TABS: 250.0000 mg | ORAL_TABLET | Freq: Two times a day (BID) | ORAL | 0 refills | Status: DC

## 2018-05-29 NOTE — Telephone Encounter (Signed)
Dr. March Rummage states he would like to see pt after her dialysis. I informed pt of Dr. March Rummage request and she states she will be here at 11:15am.

## 2018-05-29 NOTE — Telephone Encounter (Signed)
Will see patient after dialysis

## 2018-05-29 NOTE — Telephone Encounter (Signed)
Pt said her left foot is swollen was wondering if Dr. March Rummage could call her in an antibiotic.

## 2018-05-29 NOTE — Telephone Encounter (Signed)
Pt states the right foot Dr. March Rummage found a blister on and popped is red, and swollen even after using the triple antibiotic ointment. I offered pt an appt today at 9:15am, she states she has dialysis to 11:00am today.

## 2018-05-31 NOTE — Progress Notes (Signed)
  Subjective:  Patient ID: Oneida Alar, female    DOB: January 31, 1976,  MRN: [id number]  Chief Complaint  Patient presents with  . Diabetic Ulcer    right foot lateral - marked heat and swelling - low grade fever yesterday   42 y.o. female returns for wound care.  Reports swelling and warmth to the right foot with low-grade fever since yesterday.  Sent here by dialysis  Objective:   Vitals:   05/29/18 1223  Temp: 98.4 F (36.9 C)   General AA&O x3. Normal mood and affect.  Vascular Foot warm to touch.  Neurologic Sensation grossly diminished.  Dermatologic (Wound) Wound Location: Left first MP Wound Measurement: 0.5x0.5 Wound Base: Granular/Healthy Peri-wound: Normal Exudate: none   4 x 2 blister to the plantar right foot surrounding erythema no active drainage wound base fibro-granular  Orthopedic: No pain to palpation either foot.   Assessment & Plan:  Patient was evaluated and treated and all questions answered.  Ulcer L 1st MPJ -Improving no debridement today  Wound right fifth MPJ -Selective debridement with 312 blade of all nonviable tissue performed 4 x 2 pre-and post debridement.  Dressed with Silvadene DSD -Rx Clinda and Cipro for local infection X-rays taken reviewed no underlying osseous abnormality  Return in about 5 days (around 06/03/2018).

## 2018-06-02 ENCOUNTER — Ambulatory Visit: Payer: Medicaid Other

## 2018-06-03 ENCOUNTER — Ambulatory Visit: Payer: Medicaid Other | Admitting: Podiatry

## 2018-06-06 ENCOUNTER — Inpatient Hospital Stay (HOSPITAL_COMMUNITY)
Admission: EM | Admit: 2018-06-06 | Discharge: 2018-06-12 | DRG: 255 | Disposition: A | Discharge status: Home Health | Payer: Medicare Other | Attending: Internal Medicine | Admitting: Internal Medicine

## 2018-06-06 ENCOUNTER — Encounter (HOSPITAL_COMMUNITY): Payer: Self-pay | Admitting: Emergency Medicine

## 2018-06-06 ENCOUNTER — Other Ambulatory Visit: Payer: Self-pay

## 2018-06-06 DIAGNOSIS — E11628 Type 2 diabetes mellitus with other skin complications: Secondary | ICD-10-CM | POA: Diagnosis present

## 2018-06-06 DIAGNOSIS — N2581 Secondary hyperparathyroidism of renal origin: Secondary | ICD-10-CM | POA: Diagnosis present

## 2018-06-06 DIAGNOSIS — Z90710 Acquired absence of both cervix and uterus: Secondary | ICD-10-CM

## 2018-06-06 DIAGNOSIS — I96 Gangrene, not elsewhere classified: Secondary | ICD-10-CM | POA: Diagnosis present

## 2018-06-06 DIAGNOSIS — I120 Hypertensive chronic kidney disease with stage 5 chronic kidney disease or end stage renal disease: Secondary | ICD-10-CM | POA: Diagnosis present

## 2018-06-06 DIAGNOSIS — E1040 Type 1 diabetes mellitus with diabetic neuropathy, unspecified: Secondary | ICD-10-CM | POA: Diagnosis present

## 2018-06-06 DIAGNOSIS — L03115 Cellulitis of right lower limb: Secondary | ICD-10-CM | POA: Diagnosis present

## 2018-06-06 DIAGNOSIS — F17210 Nicotine dependence, cigarettes, uncomplicated: Secondary | ICD-10-CM | POA: Diagnosis present

## 2018-06-06 DIAGNOSIS — Z992 Dependence on renal dialysis: Secondary | ICD-10-CM

## 2018-06-06 DIAGNOSIS — E10319 Type 1 diabetes mellitus with unspecified diabetic retinopathy without macular edema: Secondary | ICD-10-CM | POA: Diagnosis present

## 2018-06-06 DIAGNOSIS — E1052 Type 1 diabetes mellitus with diabetic peripheral angiopathy with gangrene: Principal | ICD-10-CM | POA: Diagnosis present

## 2018-06-06 DIAGNOSIS — E119 Type 2 diabetes mellitus without complications: Secondary | ICD-10-CM

## 2018-06-06 DIAGNOSIS — IMO0001 Reserved for inherently not codable concepts without codable children: Secondary | ICD-10-CM

## 2018-06-06 DIAGNOSIS — E10621 Type 1 diabetes mellitus with foot ulcer: Secondary | ICD-10-CM | POA: Diagnosis present

## 2018-06-06 DIAGNOSIS — L97519 Non-pressure chronic ulcer of other part of right foot with unspecified severity: Secondary | ICD-10-CM | POA: Diagnosis present

## 2018-06-06 DIAGNOSIS — Z8249 Family history of ischemic heart disease and other diseases of the circulatory system: Secondary | ICD-10-CM

## 2018-06-06 DIAGNOSIS — L089 Local infection of the skin and subcutaneous tissue, unspecified: Secondary | ICD-10-CM

## 2018-06-06 DIAGNOSIS — N186 End stage renal disease: Secondary | ICD-10-CM

## 2018-06-06 DIAGNOSIS — Z794 Long term (current) use of insulin: Secondary | ICD-10-CM

## 2018-06-06 DIAGNOSIS — D631 Anemia in chronic kidney disease: Secondary | ICD-10-CM | POA: Diagnosis present

## 2018-06-06 DIAGNOSIS — E8889 Other specified metabolic disorders: Secondary | ICD-10-CM | POA: Diagnosis present

## 2018-06-06 DIAGNOSIS — R509 Fever, unspecified: Secondary | ICD-10-CM | POA: Diagnosis not present

## 2018-06-06 DIAGNOSIS — Z8614 Personal history of Methicillin resistant Staphylococcus aureus infection: Secondary | ICD-10-CM

## 2018-06-06 DIAGNOSIS — E611 Iron deficiency: Secondary | ICD-10-CM | POA: Diagnosis present

## 2018-06-06 DIAGNOSIS — M869 Osteomyelitis, unspecified: Secondary | ICD-10-CM | POA: Diagnosis present

## 2018-06-06 DIAGNOSIS — E1069 Type 1 diabetes mellitus with other specified complication: Secondary | ICD-10-CM | POA: Diagnosis present

## 2018-06-06 DIAGNOSIS — E1022 Type 1 diabetes mellitus with diabetic chronic kidney disease: Secondary | ICD-10-CM | POA: Diagnosis present

## 2018-06-06 HISTORY — DX: Osteomyelitis, unspecified: M86.9

## 2018-06-06 HISTORY — DX: Essential (primary) hypertension: I10

## 2018-06-06 HISTORY — DX: Disorder of kidney and ureter, unspecified: N28.9

## 2018-06-06 HISTORY — DX: Type 2 diabetes mellitus without complications: E11.9

## 2018-06-06 LAB — CBC WITH DIFFERENTIAL/PLATELET
ABS IMMATURE GRANULOCYTES: 0.1 10*3/uL (ref 0.0–0.1)
BASOS ABS: 0.1 10*3/uL (ref 0.0–0.1)
BASOS PCT: 1 %
Eosinophils Absolute: 0.5 10*3/uL (ref 0.0–0.7)
Eosinophils Relative: 3 %
HCT: 34.9 % — ABNORMAL LOW (ref 36.0–46.0)
HEMOGLOBIN: 10.6 g/dL — AB (ref 12.0–15.0)
IMMATURE GRANULOCYTES: 1 %
Lymphocytes Relative: 11 %
Lymphs Abs: 1.7 10*3/uL (ref 0.7–4.0)
MCH: 29.1 pg (ref 26.0–34.0)
MCHC: 30.4 g/dL (ref 30.0–36.0)
MCV: 95.9 fL (ref 78.0–100.0)
MONOS PCT: 9 %
Monocytes Absolute: 1.4 10*3/uL — ABNORMAL HIGH (ref 0.1–1.0)
NEUTROS ABS: 11.7 10*3/uL — AB (ref 1.7–7.7)
NEUTROS PCT: 75 %
Platelets: 269 10*3/uL (ref 150–400)
RBC: 3.64 MIL/uL — ABNORMAL LOW (ref 3.87–5.11)
RDW: 14.4 % (ref 11.5–15.5)
WBC: 15.4 10*3/uL — ABNORMAL HIGH (ref 4.0–10.5)

## 2018-06-06 LAB — COMPREHENSIVE METABOLIC PANEL
ALK PHOS: 157 U/L — AB (ref 38–126)
ALT: 15 U/L (ref 0–44)
ANION GAP: 11 (ref 5–15)
AST: 17 U/L (ref 15–41)
Albumin: 3.2 g/dL — ABNORMAL LOW (ref 3.5–5.0)
BILIRUBIN TOTAL: 0.8 mg/dL (ref 0.3–1.2)
BUN: 37 mg/dL — ABNORMAL HIGH (ref 6–20)
CALCIUM: 9.4 mg/dL (ref 8.9–10.3)
CO2: 26 mmol/L (ref 22–32)
CREATININE: 6.98 mg/dL — AB (ref 0.44–1.00)
Chloride: 100 mmol/L (ref 98–111)
GFR, EST AFRICAN AMERICAN: 8 mL/min — AB (ref >60)
GFR, EST NON AFRICAN AMERICAN: 7 mL/min — AB (ref >60)
Glucose, Bld: 173 mg/dL — ABNORMAL HIGH (ref 70–99)
Potassium: 4.8 mmol/L (ref 3.5–5.1)
SODIUM: 137 mmol/L (ref 135–145)
TOTAL PROTEIN: 6.7 g/dL (ref 6.5–8.1)

## 2018-06-06 LAB — I-STAT CG4 LACTIC ACID, ED: Lactic Acid, Venous: 1.62 mmol/L (ref 0.5–1.9)

## 2018-06-06 MED ORDER — VANCOMYCIN HCL IN DEXTROSE 1-5 GM/200ML-% IV SOLN: 1000.0000 mg | Freq: Once | INTRAVENOUS | Status: DC

## 2018-06-06 MED ORDER — PIPERACILLIN-TAZOBACTAM 3.375 G IVPB 30 MIN: 3.3750 g | Freq: Once | INTRAVENOUS | Status: DC

## 2018-06-06 NOTE — ED Provider Notes (Signed)
[facility] EMERGENCY DEPARTMENT Provider Note   CSN: [id number] Arrival date & time: 06/06/18  2126     History   Chief Complaint Chief Complaint  Patient presents with  . Wound Infection    HPI Karen [name] is a 42 y.o. female.  Patient with history of ESRD-HD, IDDM, HTN presents with concern for right foot infection. She is followed by Dr. March Rummage and has a healing left foot lesion and new right foot ulceration that was evaluated on 05/29/18 by Dr. March Rummage. She was started on Cipro and Clindamycin at that time and finished the antibiotic yesterday. She is here with increasing pain, redness and swelling of the foot. She endorses a low grade fever and nausea.   The history is provided by the patient. No language interpreter was used.    Past Medical History:  Diagnosis Date  . Diabetes mellitus without complication ([place])   . Hypertension   . Renal disorder     Patient Active Problem List   Diagnosis Date Noted  . ESRD on hemodialysis ([place]) 05/07/2018  . History of recurrent UTIs 05/07/2018  . IDDM (insulin dependent diabetes mellitus) ([place]) 05/07/2018  . Volume overload 04/13/2018  . Acute pulmonary edema ([place]) 01/16/2018  . Acute renal failure on dialysis ([place]) 01/16/2018  . Bilateral pneumonia 01/16/2018  . 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 History of MRSA infection 01/16/2018  . Hyperkalemia 01/16/2018  . Acute pyelonephritis 10/22/2017  . Left flank pain 10/22/2017  . Nausea and vomiting 10/22/2017  . MRSA bacteremia 10/12/2017  . Bilateral pleural effusion 10/10/2017  . 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 No pertinent surgical history.   OB History   None      Home Medications    Prior to Admission medications   Medication Sig Start Date End Date Taking? Authorizing Provider  amLODipine (NORVASC) 10 MG tablet Take by mouth.    [provider]  B  Complex-C-Folic Acid (RENA-VITE PO) Take by mouth.    [provider]  calcium acetate (PHOSLO) 667 MG capsule Take by mouth.    [provider]  Cholecalciferol (VITAMIN D-1000 MAX ST) 1000 units tablet Take by mouth.    [provider]  ciprofloxacin (CIPRO) 250 MG tablet Take 1 tablet (250 mg total) by mouth 2 (two) times daily. 05/29/18   Evelina Bucy, DPM  clindamycin (CLEOCIN) 150 MG capsule Take 1 capsule (150 mg total) by mouth 2 (two) times daily. 05/29/18   Evelina Bucy, DPM  collagenase (SANTYL) ointment Apply 1 application topically daily. 05/07/18   Evelina Bucy, DPM  epoetin alfa (EPOGEN) 10000 UNIT/ML injection Inject into the skin. 10/24/17   [provider]  famotidine (PEPCID) 20 MG tablet Take by mouth.    [provider]  gabapentin (NEURONTIN) 100 MG capsule Take by mouth. 01/26/18   [provider]  hydrALAZINE (APRESOLINE) 100 MG tablet Take by mouth. 10/24/17   [provider]  insulin aspart (NOVOLOG) 100 UNIT/ML injection Inject into the skin.    [provider]  insulin glargine (LANTUS) 100 UNIT/ML injection Inject into the skin.    [provider]  labetalol (NORMODYNE) 200 MG tablet Take by mouth.    [provider]  losartan (COZAAR) 100 MG tablet Take by mouth.    [provider]  ondansetron (ZOFRAN) 4 MG tablet Take by mouth. 10/24/17   [provider]    Family  History No family history on file.  Social History Social History   Tobacco Use  . Smoking status: Current Some Day Smoker  . Smokeless tobacco: Never Used  Substance Use Topics  . Alcohol use: Not Currently    Frequency: Never  . Drug use: Yes    Types: Marijuana     Allergies   Patient has no known allergies.   Review of Systems Review of Systems  Constitutional: Positive for fever (Low grade).  Respiratory: Negative.  Negative for cough and shortness of breath.     Cardiovascular: Negative.   Gastrointestinal: Positive for nausea. Negative for abdominal pain and vomiting.  Musculoskeletal:       See HPI.  Skin: Positive for color change and wound.  Neurological: Negative.      Physical Exam Updated Vital Signs BP (!) 150/71 (BP Location: Right Arm)   Pulse 72   Temp 99.1 F (37.3 C) (Oral)   SpO2 100%   Physical Exam  Constitutional: She is oriented to person, place, and time. She appears well-developed and well-nourished.  Neck: Normal range of motion.  Pulmonary/Chest: Effort normal.  Neurological: She is alert and oriented to person, place, and time.  Skin: Skin is warm and dry. There is erythema.  Large, partial thickness ulceration to right foot adjacent to 1st MTP. The forefoot is significantly swollen, red and warm. Mild drainage from the ulceration.      ED Treatments / Results  Labs (all labs ordered are listed, but only abnormal results are displayed) Labs Reviewed  COMPREHENSIVE METABOLIC PANEL - Abnormal; Notable for the following components:      Result Value   Glucose, Bld 173 (*)    BUN 37 (*)    Creatinine, Ser 6.98 (*)    Albumin 3.2 (*)    Alkaline Phosphatase 157 (*)    GFR calc non Af Amer 7 (*)    GFR calc Af Amer 8 (*)    All other components within normal limits  CBC WITH DIFFERENTIAL/PLATELET - Abnormal; Notable for the following components:   WBC 15.4 (*)    RBC 3.64 (*)    Hemoglobin 10.6 (*)    HCT 34.9 (*)    Neutro Abs 11.7 (*)    Monocytes Absolute 1.4 (*)    All other components within normal limits  URINALYSIS, ROUTINE W REFLEX MICROSCOPIC  I-STAT CG4 LACTIC ACID, ED  I-STAT CG4 LACTIC ACID, ED    EKG None  Radiology No results found.  Procedures Procedures (including critical care time)  Medications Ordered in ED Medications  vancomycin (VANCOCIN) IVPB 1000 mg/200 mL premix (has no administration in time range)  piperacillin-tazobactam (ZOSYN) IVPB 3.375 g (has no  administration in time range)     Initial Impression / Assessment and Plan / ED Course  I have reviewed the triage vital signs and the nursing notes.  Pertinent labs & imaging results that were available during my care of the patient were reviewed by me and considered in my medical decision making (see chart for details).     Patient with h/o IDDM, current foot ulcer with progressive symptoms of infection. She reports low grade fevers, nausea. Finished antibiotics yesterday and reports redness, swelling are worse. She has noticed foul odor from the wound.   Dr. Eleanora Neighbor notes are available for review with photo. The photo compared to today's exam shows progressive redness and swelling. No active drainage. She has a leukocytosis of 15.5.   Feel she has failed outpatient treatment  for right foot infection and would benefit from admission and IV abx. Vanc and Zosyn started. Hospitalist paged for admission.   The patient's O2 saturation decreases to 85-86% when sleeping. She denies SOB, cough, CP. O2 by [place] at 2L started.   Final Clinical Impressions(s) / ED Diagnoses   Final diagnoses:  None   1. Cellulitis right foot 2. Diabetic foot ulcer  ED Discharge Orders    None       Charlann Lange, PA-C 06/07/18 0032    Nat Christen, MD 06/07/18 [phone]

## 2018-06-06 NOTE — ED Notes (Signed)
Pt unable to void at this time. 

## 2018-06-06 NOTE — ED Triage Notes (Addendum)
Pt states she has an ulcer on the top of her right foot. Completed anbx yesterday for this but states wound has a foul odor, draining, endorses fever, chills, nausea. Pt is M,W,F dialysis.  Access on the left.

## 2018-06-07 ENCOUNTER — Inpatient Hospital Stay (HOSPITAL_COMMUNITY): Payer: Medicare Other

## 2018-06-07 ENCOUNTER — Emergency Department (HOSPITAL_COMMUNITY): Payer: Medicare Other

## 2018-06-07 ENCOUNTER — Encounter (HOSPITAL_COMMUNITY): Payer: Self-pay | Admitting: Family Medicine

## 2018-06-07 DIAGNOSIS — Z8249 Family history of ischemic heart disease and other diseases of the circulatory system: Secondary | ICD-10-CM | POA: Diagnosis not present

## 2018-06-07 DIAGNOSIS — N186 End stage renal disease: Secondary | ICD-10-CM

## 2018-06-07 DIAGNOSIS — L0390 Cellulitis, unspecified: Secondary | ICD-10-CM

## 2018-06-07 DIAGNOSIS — Z90710 Acquired absence of both cervix and uterus: Secondary | ICD-10-CM | POA: Diagnosis not present

## 2018-06-07 DIAGNOSIS — Z794 Long term (current) use of insulin: Secondary | ICD-10-CM

## 2018-06-07 DIAGNOSIS — D631 Anemia in chronic kidney disease: Secondary | ICD-10-CM | POA: Diagnosis present

## 2018-06-07 DIAGNOSIS — E119 Type 2 diabetes mellitus without complications: Secondary | ICD-10-CM | POA: Diagnosis not present

## 2018-06-07 DIAGNOSIS — E11628 Type 2 diabetes mellitus with other skin complications: Secondary | ICD-10-CM | POA: Diagnosis present

## 2018-06-07 DIAGNOSIS — L089 Local infection of the skin and subcutaneous tissue, unspecified: Secondary | ICD-10-CM

## 2018-06-07 DIAGNOSIS — E1052 Type 1 diabetes mellitus with diabetic peripheral angiopathy with gangrene: Secondary | ICD-10-CM | POA: Diagnosis present

## 2018-06-07 DIAGNOSIS — E10319 Type 1 diabetes mellitus with unspecified diabetic retinopathy without macular edema: Secondary | ICD-10-CM | POA: Diagnosis present

## 2018-06-07 DIAGNOSIS — I96 Gangrene, not elsewhere classified: Secondary | ICD-10-CM

## 2018-06-07 DIAGNOSIS — M869 Osteomyelitis, unspecified: Secondary | ICD-10-CM | POA: Diagnosis present

## 2018-06-07 DIAGNOSIS — I10 Essential (primary) hypertension: Secondary | ICD-10-CM | POA: Diagnosis not present

## 2018-06-07 DIAGNOSIS — I120 Hypertensive chronic kidney disease with stage 5 chronic kidney disease or end stage renal disease: Secondary | ICD-10-CM | POA: Diagnosis present

## 2018-06-07 DIAGNOSIS — I739 Peripheral vascular disease, unspecified: Secondary | ICD-10-CM

## 2018-06-07 DIAGNOSIS — N2581 Secondary hyperparathyroidism of renal origin: Secondary | ICD-10-CM | POA: Diagnosis present

## 2018-06-07 DIAGNOSIS — E1022 Type 1 diabetes mellitus with diabetic chronic kidney disease: Secondary | ICD-10-CM | POA: Diagnosis present

## 2018-06-07 DIAGNOSIS — E10621 Type 1 diabetes mellitus with foot ulcer: Secondary | ICD-10-CM | POA: Diagnosis present

## 2018-06-07 DIAGNOSIS — Z992 Dependence on renal dialysis: Secondary | ICD-10-CM

## 2018-06-07 DIAGNOSIS — R509 Fever, unspecified: Secondary | ICD-10-CM | POA: Diagnosis present

## 2018-06-07 DIAGNOSIS — E611 Iron deficiency: Secondary | ICD-10-CM | POA: Diagnosis present

## 2018-06-07 DIAGNOSIS — E1040 Type 1 diabetes mellitus with diabetic neuropathy, unspecified: Secondary | ICD-10-CM | POA: Diagnosis present

## 2018-06-07 DIAGNOSIS — L03115 Cellulitis of right lower limb: Secondary | ICD-10-CM | POA: Diagnosis present

## 2018-06-07 DIAGNOSIS — M86571 Other chronic hematogenous osteomyelitis, right ankle and foot: Secondary | ICD-10-CM | POA: Diagnosis not present

## 2018-06-07 DIAGNOSIS — E8889 Other specified metabolic disorders: Secondary | ICD-10-CM | POA: Diagnosis present

## 2018-06-07 DIAGNOSIS — E1069 Type 1 diabetes mellitus with other specified complication: Secondary | ICD-10-CM | POA: Diagnosis present

## 2018-06-07 DIAGNOSIS — M86672 Other chronic osteomyelitis, left ankle and foot: Secondary | ICD-10-CM | POA: Diagnosis not present

## 2018-06-07 DIAGNOSIS — F17210 Nicotine dependence, cigarettes, uncomplicated: Secondary | ICD-10-CM | POA: Diagnosis present

## 2018-06-07 DIAGNOSIS — L97519 Non-pressure chronic ulcer of other part of right foot with unspecified severity: Secondary | ICD-10-CM | POA: Diagnosis present

## 2018-06-07 DIAGNOSIS — Z8614 Personal history of Methicillin resistant Staphylococcus aureus infection: Secondary | ICD-10-CM | POA: Diagnosis not present

## 2018-06-07 LAB — RENAL FUNCTION PANEL
Albumin: 2.8 g/dL — ABNORMAL LOW (ref 3.5–5.0)
Anion gap: 12 (ref 5–15)
BUN: 52 mg/dL — ABNORMAL HIGH (ref 6–20)
CO2: 24 mmol/L (ref 22–32)
Calcium: 9.1 mg/dL (ref 8.9–10.3)
Chloride: 96 mmol/L — ABNORMAL LOW (ref 98–111)
Creatinine, Ser: 8.69 mg/dL — ABNORMAL HIGH (ref 0.44–1.00)
GFR calc Af Amer: 6 mL/min — ABNORMAL LOW (ref >60)
GFR calc non Af Amer: 5 mL/min — ABNORMAL LOW (ref >60)
Glucose, Bld: 243 mg/dL — ABNORMAL HIGH (ref 70–99)
Phosphorus: 5.4 mg/dL — ABNORMAL HIGH (ref 2.5–4.6)
Potassium: 5.5 mmol/L — ABNORMAL HIGH (ref 3.5–5.1)
Sodium: 132 mmol/L — ABNORMAL LOW (ref 135–145)

## 2018-06-07 LAB — CBC WITH DIFFERENTIAL/PLATELET
ABS IMMATURE GRANULOCYTES: 0.1 10*3/uL (ref 0.0–0.1)
BASOS PCT: 0 %
Basophils Absolute: 0.1 10*3/uL (ref 0.0–0.1)
EOS ABS: 0.4 10*3/uL (ref 0.0–0.7)
Eosinophils Relative: 3 %
HEMATOCRIT: 30.8 % — AB (ref 36.0–46.0)
Hemoglobin: 9.7 g/dL — ABNORMAL LOW (ref 12.0–15.0)
Immature Granulocytes: 1 %
LYMPHS ABS: 2.2 10*3/uL (ref 0.7–4.0)
LYMPHS PCT: 16 %
MCH: 29.5 pg (ref 26.0–34.0)
MCHC: 31.5 g/dL (ref 30.0–36.0)
MCV: 93.6 fL (ref 78.0–100.0)
MONO ABS: 1.3 10*3/uL — AB (ref 0.1–1.0)
MONOS PCT: 10 %
NEUTROS ABS: 9.5 10*3/uL — AB (ref 1.7–7.7)
Neutrophils Relative %: 70 %
Platelets: 240 10*3/uL (ref 150–400)
RBC: 3.29 MIL/uL — ABNORMAL LOW (ref 3.87–5.11)
RDW: 14.1 % (ref 11.5–15.5)
WBC: 13.5 10*3/uL — ABNORMAL HIGH (ref 4.0–10.5)

## 2018-06-07 LAB — GLUCOSE, CAPILLARY
GLUCOSE-CAPILLARY: 166 mg/dL — AB (ref 70–99)
Glucose-Capillary: 189 mg/dL — ABNORMAL HIGH (ref 70–99)
Glucose-Capillary: 266 mg/dL — ABNORMAL HIGH (ref 70–99)
Glucose-Capillary: 244 mg/dL — ABNORMAL HIGH (ref 70–99)
Glucose-Capillary: 222 mg/dL — ABNORMAL HIGH (ref 70–99)

## 2018-06-07 LAB — SEDIMENTATION RATE: Sed Rate: 60 mm/hr — ABNORMAL HIGH (ref 0–22)

## 2018-06-07 LAB — CBC
HCT: 30.6 % — ABNORMAL LOW (ref 36.0–46.0)
Hemoglobin: 9.6 g/dL — ABNORMAL LOW (ref 12.0–15.0)
MCH: 29.4 pg (ref 26.0–34.0)
MCHC: 31.4 g/dL (ref 30.0–36.0)
MCV: 93.9 fL (ref 78.0–100.0)
Platelets: 245 10*3/uL (ref 150–400)
RBC: 3.26 MIL/uL — ABNORMAL LOW (ref 3.87–5.11)
RDW: 14.6 % (ref 11.5–15.5)
WBC: 11 10*3/uL — ABNORMAL HIGH (ref 4.0–10.5)

## 2018-06-07 LAB — BASIC METABOLIC PANEL
ANION GAP: 11 (ref 5–15)
BUN: 39 mg/dL — ABNORMAL HIGH (ref 6–20)
CO2: 27 mmol/L (ref 22–32)
Calcium: 9 mg/dL (ref 8.9–10.3)
Chloride: 98 mmol/L (ref 98–111)
Creatinine, Ser: 7.26 mg/dL — ABNORMAL HIGH (ref 0.44–1.00)
GFR calc Af Amer: 7 mL/min — ABNORMAL LOW (ref >60)
GFR calc non Af Amer: 6 mL/min — ABNORMAL LOW (ref >60)
GLUCOSE: 211 mg/dL — AB (ref 70–99)
POTASSIUM: 4.8 mmol/L (ref 3.5–5.1)
Sodium: 136 mmol/L (ref 135–145)

## 2018-06-07 LAB — PREALBUMIN: Prealbumin: 18.6 mg/dL (ref 18–38)

## 2018-06-07 LAB — C-REACTIVE PROTEIN: CRP: 4.3 mg/dL — AB (ref <1.0)

## 2018-06-07 LAB — HIV ANTIBODY (ROUTINE TESTING W REFLEX): HIV Screen 4th Generation wRfx: NONREACTIVE

## 2018-06-07 MED ORDER — HYDRALAZINE HCL 50 MG PO TABS
100.0000 mg | ORAL_TABLET | Freq: Three times a day (TID) | ORAL | Status: DC
Start: 2018-06-07 — End: 2018-06-12
  Administered 2018-06-07 – 2018-06-12 (×14): 100 mg via ORAL
  Filled 2018-06-07 (×15): qty 2

## 2018-06-07 MED ORDER — CEFTRIAXONE SODIUM 2 G IJ SOLR
2.0000 g | INTRAMUSCULAR | Status: DC
  Administered 2018-06-07 – 2018-06-12 (×6): 2 g via INTRAVENOUS
  Filled 2018-06-07 (×7): qty 20

## 2018-06-07 MED ORDER — SODIUM CHLORIDE 0.9% FLUSH
3.0000 mL | Freq: Two times a day (BID) | INTRAVENOUS | Status: DC
  Administered 2018-06-09 – 2018-06-12 (×6): 3 mL via INTRAVENOUS

## 2018-06-07 MED ORDER — ONDANSETRON HCL 4 MG PO TABS: 4.0000 mg | ORAL_TABLET | Freq: Four times a day (QID) | ORAL | Status: DC | PRN

## 2018-06-07 MED ORDER — HYDROCODONE-ACETAMINOPHEN 5-325 MG PO TABS: 1.0000 | ORAL_TABLET | ORAL | Status: DC | PRN

## 2018-06-07 MED ORDER — DARBEPOETIN ALFA 40 MCG/0.4ML IJ SOSY: 40.0000 ug | PREFILLED_SYRINGE | INTRAMUSCULAR | Status: DC

## 2018-06-07 MED ORDER — INSULIN ASPART 100 UNIT/ML ~~LOC~~ SOLN
0.0000 [IU] | Freq: Three times a day (TID) | SUBCUTANEOUS | Status: DC
  Administered 2018-06-07: 3 [IU] via SUBCUTANEOUS
  Administered 2018-06-07: 2 [IU] via SUBCUTANEOUS
  Administered 2018-06-07: 5 [IU] via SUBCUTANEOUS
  Administered 2018-06-09: 2 [IU] via SUBCUTANEOUS
  Administered 2018-06-09: 5 [IU] via SUBCUTANEOUS
  Administered 2018-06-09: 3 [IU] via SUBCUTANEOUS
  Administered 2018-06-10 – 2018-06-11 (×3): 2 [IU] via SUBCUTANEOUS
  Administered 2018-06-11: 9 [IU] via SUBCUTANEOUS
  Administered 2018-06-12: 5 [IU] via SUBCUTANEOUS

## 2018-06-07 MED ORDER — METRONIDAZOLE IN NACL 5-0.79 MG/ML-% IV SOLN
500.0000 mg | Freq: Three times a day (TID) | INTRAVENOUS | Status: DC
  Administered 2018-06-07 – 2018-06-12 (×14): 500 mg via INTRAVENOUS
  Filled 2018-06-07 (×14): qty 100

## 2018-06-07 MED ORDER — ONDANSETRON HCL 4 MG/2ML IJ SOLN: 4.0000 mg | Freq: Four times a day (QID) | INTRAMUSCULAR | Status: DC | PRN

## 2018-06-07 MED ORDER — LOSARTAN POTASSIUM 50 MG PO TABS
100.0000 mg | ORAL_TABLET | Freq: Every day | ORAL | Status: DC
  Administered 2018-06-07 – 2018-06-12 (×5): 100 mg via ORAL
  Filled 2018-06-07 (×5): qty 2

## 2018-06-07 MED ORDER — RENA-VITE PO TABS
1.0000 | ORAL_TABLET | Freq: Every day | ORAL | Status: DC
  Administered 2018-06-07 – 2018-06-11 (×5): 1 via ORAL
  Filled 2018-06-07 (×5): qty 1

## 2018-06-07 MED ORDER — VANCOMYCIN HCL IN DEXTROSE 1-5 GM/200ML-% IV SOLN
1000.0000 mg | INTRAVENOUS | Status: DC
  Administered 2018-06-09 – 2018-06-12 (×3): 1000 mg via INTRAVENOUS
  Filled 2018-06-07 (×3): qty 200

## 2018-06-07 MED ORDER — CHLORHEXIDINE GLUCONATE CLOTH 2 % EX PADS
6.0000 | MEDICATED_PAD | Freq: Every day | CUTANEOUS | Status: DC
  Administered 2018-06-07 – 2018-06-09 (×3): 6 via TOPICAL

## 2018-06-07 MED ORDER — LABETALOL HCL 200 MG PO TABS
200.0000 mg | ORAL_TABLET | Freq: Two times a day (BID) | ORAL | Status: DC
  Administered 2018-06-07 – 2018-06-12 (×11): 200 mg via ORAL
  Filled 2018-06-07 (×11): qty 1

## 2018-06-07 MED ORDER — VANCOMYCIN HCL 10 G IV SOLR
1500.0000 mg | Freq: Once | INTRAVENOUS | Status: AC
  Administered 2018-06-07: 1500 mg via INTRAVENOUS
  Filled 2018-06-07: qty 1500

## 2018-06-07 MED ORDER — ACETAMINOPHEN 325 MG PO TABS
650.0000 mg | ORAL_TABLET | Freq: Four times a day (QID) | ORAL | Status: DC | PRN
  Administered 2018-06-07: 650 mg via ORAL
  Filled 2018-06-07: qty 2

## 2018-06-07 MED ORDER — ALTEPLASE 2 MG IJ SOLR
2.0000 mg | Freq: Once | INTRAMUSCULAR | Status: DC | PRN
  Filled 2018-06-07: qty 2

## 2018-06-07 MED ORDER — HEPARIN SODIUM (PORCINE) 5000 UNIT/ML IJ SOLN: 5000.0000 [IU] | Freq: Three times a day (TID) | INTRAMUSCULAR | Status: DC

## 2018-06-07 MED ORDER — ACETAMINOPHEN 650 MG RE SUPP: 650.0000 mg | Freq: Four times a day (QID) | RECTAL | Status: DC | PRN

## 2018-06-07 MED ORDER — INSULIN ASPART 100 UNIT/ML ~~LOC~~ SOLN
0.0000 [IU] | Freq: Every day | SUBCUTANEOUS | Status: DC
  Administered 2018-06-07 – 2018-06-10 (×3): 2 [IU] via SUBCUTANEOUS
  Administered 2018-06-11: 4 [IU] via SUBCUTANEOUS

## 2018-06-07 MED ORDER — DOXERCALCIFEROL 4 MCG/2ML IV SOLN
5.0000 ug | INTRAVENOUS | Status: DC
  Administered 2018-06-08 – 2018-06-12 (×3): 5 ug via INTRAVENOUS
  Filled 2018-06-07 (×3): qty 4

## 2018-06-07 MED ORDER — INSULIN GLARGINE 100 UNIT/ML ~~LOC~~ SOLN: 10.0000 [IU] | Freq: Every day | SUBCUTANEOUS | Status: DC

## 2018-06-07 MED ORDER — SODIUM CHLORIDE 0.9 % IV SOLN
1500.0000 mg | Freq: Once | INTRAVENOUS | Status: DC
  Filled 2018-06-07: qty 1500

## 2018-06-07 MED ORDER — SODIUM CHLORIDE 0.9 % IV SOLN: 100.0000 mL | INTRAVENOUS | Status: DC | PRN

## 2018-06-07 MED ORDER — COLLAGENASE 250 UNIT/GM EX OINT
TOPICAL_OINTMENT | Freq: Every day | CUTANEOUS | Status: DC
  Administered 2018-06-07 – 2018-06-12 (×5): via TOPICAL
  Filled 2018-06-07 (×2): qty 30

## 2018-06-07 MED ORDER — IOPAMIDOL (ISOVUE-370) INJECTION 76%
INTRAVENOUS | Status: AC
  Filled 2018-06-07: qty 100

## 2018-06-07 MED ORDER — INSULIN GLARGINE 100 UNIT/ML ~~LOC~~ SOLN
5.0000 [IU] | Freq: Once | SUBCUTANEOUS | Status: DC
  Filled 2018-06-07: qty 0.05

## 2018-06-07 MED ORDER — SODIUM CHLORIDE 0.9 % IV SOLN: 250.0000 mL | INTRAVENOUS | Status: DC | PRN

## 2018-06-07 MED ORDER — INSULIN GLARGINE 100 UNIT/ML ~~LOC~~ SOLN
5.0000 [IU] | Freq: Every day | SUBCUTANEOUS | Status: DC
  Filled 2018-06-07: qty 0.05

## 2018-06-07 MED ORDER — SENNOSIDES-DOCUSATE SODIUM 8.6-50 MG PO TABS: 1.0000 | ORAL_TABLET | Freq: Every evening | ORAL | Status: DC | PRN

## 2018-06-07 MED ORDER — FAMOTIDINE 20 MG PO TABS
20.0000 mg | ORAL_TABLET | Freq: Every day | ORAL | Status: DC
  Administered 2018-06-07 – 2018-06-12 (×5): 20 mg via ORAL
  Filled 2018-06-07 (×5): qty 1

## 2018-06-07 MED ORDER — INSULIN GLARGINE 100 UNIT/ML ~~LOC~~ SOLN
5.0000 [IU] | Freq: Once | SUBCUTANEOUS | Status: AC
  Administered 2018-06-07: 5 [IU] via SUBCUTANEOUS
  Filled 2018-06-07: qty 0.05

## 2018-06-07 MED ORDER — INSULIN GLARGINE 100 UNIT/ML ~~LOC~~ SOLN
10.0000 [IU] | Freq: Every day | SUBCUTANEOUS | Status: DC
  Administered 2018-06-09 – 2018-06-11 (×4): 10 [IU] via SUBCUTANEOUS
  Filled 2018-06-07 (×6): qty 0.1

## 2018-06-07 MED ORDER — AMLODIPINE BESYLATE 10 MG PO TABS: 10.0000 mg | ORAL_TABLET | Freq: Every day | ORAL | Status: DC

## 2018-06-07 MED ORDER — LIDOCAINE HCL (PF) 1 % IJ SOLN: 5.0000 mL | INTRAMUSCULAR | Status: DC | PRN

## 2018-06-07 MED ORDER — CALCIUM ACETATE (PHOS BINDER) 667 MG PO CAPS
667.0000 mg | ORAL_CAPSULE | Freq: Three times a day (TID) | ORAL | Status: DC
  Administered 2018-06-07: 667 mg via ORAL
  Filled 2018-06-07 (×2): qty 1

## 2018-06-07 MED ORDER — GABAPENTIN 100 MG PO CAPS
100.0000 mg | ORAL_CAPSULE | Freq: Three times a day (TID) | ORAL | Status: DC
  Administered 2018-06-07 – 2018-06-12 (×15): 100 mg via ORAL
  Filled 2018-06-07 (×16): qty 1

## 2018-06-07 MED ORDER — SODIUM CHLORIDE 0.9% FLUSH
3.0000 mL | INTRAVENOUS | Status: DC | PRN
  Administered 2018-06-10: 3 mL via INTRAVENOUS
  Filled 2018-06-07: qty 3

## 2018-06-07 MED ORDER — AMLODIPINE BESYLATE 10 MG PO TABS
10.0000 mg | ORAL_TABLET | Freq: Every day | ORAL | Status: DC
  Administered 2018-06-07 – 2018-06-12 (×5): 10 mg via ORAL
  Filled 2018-06-07 (×5): qty 1

## 2018-06-07 MED ORDER — POLYVINYL ALCOHOL 1.4 % OP SOLN
1.0000 [drp] | Freq: Two times a day (BID) | OPHTHALMIC | Status: DC | PRN
  Administered 2018-06-07 – 2018-06-11 (×4): 1 [drp] via OPHTHALMIC
  Filled 2018-06-07: qty 15

## 2018-06-07 MED ORDER — CALCIUM ACETATE (PHOS BINDER) 667 MG PO CAPS
1334.0000 mg | ORAL_CAPSULE | Freq: Three times a day (TID) | ORAL | Status: DC
  Administered 2018-06-07 – 2018-06-12 (×12): 1334 mg via ORAL
  Filled 2018-06-07 (×12): qty 2

## 2018-06-07 MED ORDER — PENTAFLUOROPROP-TETRAFLUOROETH EX AERO
1.0000 "application " | INHALATION_SPRAY | CUTANEOUS | Status: DC | PRN
  Administered 2018-06-08: 1 via TOPICAL

## 2018-06-07 MED ORDER — LIDOCAINE-PRILOCAINE 2.5-2.5 % EX CREA
1.0000 "application " | TOPICAL_CREAM | CUTANEOUS | Status: DC | PRN
  Filled 2018-06-07: qty 5

## 2018-06-07 NOTE — Consult Note (Addendum)
[facility] Renal Consultation Note    Indication for Consultation:  Management of ESRD/hemodialysis; anemia, hypertension/volume and secondary hyperparathyroidism PCP:  Renaldo Reel, PA-C  HPI: Karen [name] is a 42 y.o. 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 She continues to smoke a small amount and and uses cannabis but not as much due to cost.  She has been followed by podiatry for foot wound which has not healed.  She is mostly nonambulatory due to back issues (budging lumbar dsc)  but does uses a rollator at home. She has no SOB, cough, CP.  She has chronic diarrhea and takes loperamide bid.  Cannot see enough to know if any change in color of stools. She has been out of losartan and amlodipine for several days.  She makes urine every couple days. She has severe neuropathy of LE without sensation in her feet.  She has mild neuropathy in her hands. She can see up close with her right eye.  Smoking weed helps with nausea and sometimes back pain.  She doesn't like to take pain meds.  She is using 16 gauge needles at dialysis. She has no real problems except mild cramping towards the end at times.  Tells me she has not been getting heparin at HD due to it causing viterous hemorrhage in the past -even though it is on her dialysis orders (she refuses).  Past Medical History:  Diagnosis Date  . Diabetes mellitus without complication ([place])   . Hypertension   . Renal disorder    History reviewed. No pertinent surgical history. History reviewed. No pertinent family history. Social History:  reports that she has been smoking.  She has never used smokeless tobacco. She reports that she drank alcohol.  She reports that she has current or past drug history. Drug: Marijuana. Allergies  Allergen Reactions  . Heparin Other (See Comments)    Patient relates vitreal hemorrhage after heparin.   Prior to Admission medications   Medication Sig Start Date End Date Taking? Authorizing Provider  amLODipine (NORVASC) 10 MG tablet Take by mouth.    [provider]  B Complex-C-Folic Acid (RENA-VITE PO) Take by mouth.    [provider]  calcium acetate (PHOSLO) 667 MG capsule Take by mouth.    [provider]  Cholecalciferol (VITAMIN D-1000 MAX ST) 1000 units tablet Take by mouth.    [provider]  collagenase (SANTYL) ointment Apply 1 application topically daily. 05/07/18   Evelina Bucy, DPM  epoetin alfa (EPOGEN) 10000 UNIT/ML injection Inject into the skin. 10/24/17   [provider]  famotidine (PEPCID) 20 MG tablet Take by mouth.    [provider]  gabapentin (NEURONTIN) 100 MG capsule Take by mouth. 01/26/18   [provider]  hydrALAZINE (APRESOLINE) 100 MG tablet Take by mouth. 10/24/17   [provider]  insulin aspart (NOVOLOG) 100 UNIT/ML injection Inject into the skin.    [provider]  insulin glargine (LANTUS) 100 UNIT/ML injection Inject into the skin.    [provider]  labetalol (NORMODYNE) 200 MG tablet Take by mouth.    [provider]  losartan (COZAAR) 100 MG tablet Take by mouth.    [provider]  ondansetron (  ZOFRAN) 4 MG tablet Take by mouth. 10/24/17   [provider]   Current Facility-Administered Medications  Medication Dose Route Frequency Provider Last Rate Last Dose  . 0.9 %  sodium chloride infusion  250 mL Intravenous PRN Opyd, Ilene Qua, MD      . acetaminophen (TYLENOL) tablet 650 mg  650 mg Oral Q6H PRN Opyd, Ilene Qua, MD   650 mg at 06/07/18 0217   Or  . acetaminophen (TYLENOL) suppository 650 mg  650 mg Rectal Q6H PRN Opyd, Ilene Qua, MD       . calcium acetate (PHOSLO) capsule 667 mg  667 mg Oral TID WC Opyd, Ilene Qua, MD   667 mg at 06/07/18 0800  . cefTRIAXone (ROCEPHIN) 2 g in sodium chloride 0.9 % 100 mL IVPB  2 g Intravenous Q24H Opyd, Ilene Qua, MD   Stopped at 06/07/18 0149  . collagenase (SANTYL) ointment   Topical Daily Regalado, Belkys A, MD      . famotidine (PEPCID) tablet 20 mg  20 mg Oral Daily Opyd, Ilene Qua, MD   20 mg at 06/07/18 0935  . gabapentin (NEURONTIN) capsule 100 mg  100 mg Oral TID Vianne Bulls, MD   100 mg at 06/07/18 0935  . hydrALAZINE (APRESOLINE) tablet 100 mg  100 mg Oral Q8H Opyd, Ilene Qua, MD      . HYDROcodone-acetaminophen (NORCO/VICODIN) 5-325 MG per tablet 1-2 tablet  1-2 tablet Oral Q4H PRN Opyd, Ilene Qua, MD      . insulin aspart (novoLOG) injection 0-5 Units  0-5 Units Subcutaneous QHS Opyd, Timothy S, MD      . insulin aspart (novoLOG) injection 0-9 Units  0-9 Units Subcutaneous TID WC Opyd, Ilene Qua, MD   2 Units at 06/07/18 [phone]  . [START ON 06/08/2018] insulin glargine (LANTUS) injection 10 Units  10 Units Subcutaneous QHS Regalado, Belkys A, MD      . insulin glargine (LANTUS) injection 5 Units  5 Units Subcutaneous Once Regalado, Belkys A, MD      . labetalol (NORMODYNE) tablet 200 mg  200 mg Oral BID Opyd, Ilene Qua, MD   200 mg at 06/07/18 0935  . losartan (COZAAR) tablet 100 mg  100 mg Oral Daily Opyd, Ilene Qua, MD   100 mg at 06/07/18 0935  . metroNIDAZOLE (FLAGYL) IVPB 500 mg  500 mg Intravenous Q8H Opyd, Ilene Qua, MD 100 mL/hr at 06/07/18 0941 500 mg at 06/07/18 0941  . ondansetron (ZOFRAN) tablet 4 mg  4 mg Oral Q6H PRN Opyd, Ilene Qua, MD       Or  . ondansetron (ZOFRAN) injection 4 mg  4 mg Intravenous Q6H PRN Opyd, Ilene Qua, MD      . polyvinyl alcohol (LIQUIFILM TEARS) 1.4 % ophthalmic solution 1 drop  1 drop Both Eyes BID PRN Regalado, Belkys A, MD      . senna-docusate (Senokot-S) tablet 1 tablet  1 tablet Oral QHS PRN Opyd, Ilene Qua, MD      . sodium chloride  flush (NS) 0.9 % injection 3 mL  3 mL Intravenous Q12H Opyd, Ilene Qua, MD      . sodium chloride flush (NS) 0.9 % injection 3 mL  3 mL Intravenous PRN Opyd, Ilene Qua, MD      . vancomycin (VANCOCIN) 1,500 mg in sodium chloride 0.9 % 500 mL IVPB  1,500 mg Intravenous Once Reginia Naas, RPH      . [START ON 06/08/2018] vancomycin (VANCOCIN) IVPB 1000  mg/200 mL premix  1,000 mg Intravenous Q M,W,F-HD [facility]renest Blank [facility]       Labs: Basic Metabolic Panel: Recent Labs  Lab 06/06/18 2156 06/07/18 0227  NA 137 136  K 4.8 4.8  CL 100 98  CO2 26 27  GLUCOSE 173* 211*  BUN 37* 39*  CREATININE 6.98* 7.26*  CALCIUM 9.4 9.0   Liver Function Tests: Recent Labs  Lab 06/06/18 2156  AST 17  ALT 15  ALKPHOS 157*  BILITOT 0.8  PROT 6.7  ALBUMIN 3.2*   CBC: Recent Labs  Lab 06/06/18 2156 06/07/18 0227  WBC 15.4* 13.5*  NEUTROABS 11.7* 9.5*  HGB 10.6* 9.7*  HCT 34.9* 30.8*  MCV 95.9 93.6  PLT 269 240   CBG: Recent Labs  Lab 06/07/18 0603 06/07/18 0647  GLUCAP 189* 166*   Studies/Results: Dg Foot Complete Right  Result Date: 06/07/2018 CLINICAL DATA:  Ulcer on the bottom of the right foot at the head of the metatarsal EXAM: RIGHT FOOT COMPLETE - 3+ VIEW COMPARISON:  05/29/2018 FINDINGS: No fracture or malalignment. No periostitis or bone destruction. Vascular calcifications in the soft tissues. No soft tissue gas IMPRESSION: No acute osseous abnormality Electronically Signed   By: Donavan Foil M.D.   On: 06/07/2018 00:08   ROS: As per HPI otherwise negative.  Physical Exam: Vitals:   06/07/18 0015 06/07/18 0100 06/07/18 0158 06/07/18 0543  BP: (!) 145/71  128/64 135/69  Pulse: 87 88 85 79  Resp:   19 16  Temp:   99 F (37.2 C) 98.2 F (36.8 C)  TempSrc:    Oral  SpO2: 97% 98% 98% 98%     General: WDWN NAD skin very warm to touch Head: NCAT sclera not icteric MMM facial fullness Neck: Supple.  Lungs: CTA bilaterally without wheezes, rales, or rhonchi.  Breathing is unlabored. Heart: RRR with S1 S2.  Abdomen: obese soft NT + BS Lower extremities: without edema right fore foot wrapped left  Neuro: A & O  X 3. Moves all extremities spontaneously. Psych:  Responds to questions appropriately with a normal affect. Very engaged Dialysis Access: left upper AVF + bruit  Dialysis Orders:  [facility] MWF 3.75 hours 400/600 EDW 82 2 K 2.5 Ca left upper AVF heparin 2300 (she has been refusing heparin) venofer 50/week hectorol 5 Aranesp 40 - last given 7/5  Recent labs: hgb 10.3 stable 13% sat 6/26 ferritin 528 - May Ca 9.6 P 6.2 iPTH 201 hgb A1c 6.9  Assessment/Plan: 1. Right foot ulcer - VVS has seen for MRI/doppers. Leukocytosis - on empiric antibioitcs 2. ESRD -  MWF K 4.6 - HD Monday AVF running at 300 - 350 will use 16 gauge while here; Need to d/c heparin at outpatient HD unit after d/c see HPI even though at this stage, I think heparin is probably fine. 3. Hypertension/volume  - requires multiple meds for control, losartan 100, amlodipine 10, labetolol 400 tid and hydralazine 100 tid. Ave neg UF 2- 3 L - below edw at her last tmt 7/5 with net UF 1.8;  Currently not on full dose of outpt BP meds and BP not elevated - try to titrate volume/meds while here to simply dose.  4. Anemia  - with Fe deficiency - last tsat low at 13% but stable hgb as outpatient - down slightly today - continue ESA - hold Fe for now but will need repletion 5. Metabolic bone disease -  Continue VDRA/binders iPTH well controlled P  up on 2 calcium ac and 1 with snacks 6. Nutrition - pre alb 18.6 renal carb mod/vits 7. DM - per primary 8. Hx MRSA endocarditis   Myriam Jacobson, PA-C [place] [phone] 06/07/2018, 10:51 AM

## 2018-06-07 NOTE — Consult Note (Signed)
Patient name: Karen [name] MRN: [id number] DOB: [birth year] Sex: female   REASON FOR CONSULT:    Right diabetic foot infection.  The consult is requested by the medical service.  HPI:   Karen [name] is a pleasant 42 y.o. female, who was admitted earlier this morning with a infection in her right foot with fever and increased drainage.  The patient has a history of insulin-dependent diabetes and end-stage renal disease and is on dialysis.  This patient developed a blister on the lateral/plantar aspect of her right foot adjacent to the fifth metatarsal.  She was evaluated by her podiatrist.  Despite aggressive outpatient wound care and p.o. antibiotics this wound has progressed.  She has been on p.o. Cipro and clindamycin.  Yesterday she felt "sick" and had a low-grade fever to 99.3.  She noted some drainage from the wound on her right foot and was admitted this morning with a diabetic foot infection.  Prior to developing this wound she denies any history of claudication, rest pain, or nonhealing ulcers.  She does not remember any injury to her foot.  Of note she does have diabetic neuropathy with poor feeling in her feet.  Her risk factors for peripheral vascular disease include diabetes, hypertension, a family history of premature cardiovascular disease and tobacco use.  She denies any history of hypercholesterolemia.  Past Medical History:  Diagnosis Date  . Diabetes mellitus without complication ([place])   . Hypertension   . Renal disorder    History reviewed. No pertinent family history.  FAMILY HISTORY: The patient tells me that her father had a heart attack at age 37.  SOCIAL HISTORY: She does smoke cigarettes but it is difficult to quantitate as she does not smoke every day.  She tells me that on average she probably smokes about 5 cigarettes a day. Social History   Socioeconomic History  . Marital status: Unknown    Spouse name: Not on file  . Number of  children: Not on file  . Years of education: Not on file  . Highest education level: Not on file  Occupational History  . Not on file  Social Needs  . Financial resource strain: Not on file  . Food insecurity:    Worry: Not on file    Inability: Not on file  . Transportation needs:    Medical: Not on file    Non-medical: Not on file  Tobacco Use  . Smoking status: Current Some Day Smoker  . Smokeless tobacco: Never Used  Substance and Sexual Activity  . Alcohol use: Not Currently    Frequency: Never  . Drug use: Yes    Types: Marijuana  . Sexual activity: Not on file  Lifestyle  . Physical activity:    Days per week: Not on file    Minutes per session: Not on file  . Stress: Not on file  Relationships  . Social connections:    Talks on phone: Not on file    Gets together: Not on file    Attends religious service: Not on file    Active member of club or organization: Not on file    Attends meetings of clubs or organizations: Not on file    Relationship status: Not on file  . Intimate partner violence:    Fear of current or ex partner: Not on file    Emotionally abused: Not on file    Physically abused: Not on file    Forced sexual activity: Not on file  Other Topics Concern  . Not on file  Social History Narrative  . Not on file    Allergies  Allergen Reactions  . Heparin Other (See Comments)    Patient relates vitreal hemorrhage after heparin.    Current Facility-Administered Medications  Medication Dose Route Frequency Provider Last Rate Last Dose  . 0.9 %  sodium chloride infusion  250 mL Intravenous PRN Opyd, Ilene Qua, MD      . acetaminophen (TYLENOL) tablet 650 mg  650 mg Oral Q6H PRN Opyd, Ilene Qua, MD   650 mg at 06/07/18 0217   Or  . acetaminophen (TYLENOL) suppository 650 mg  650 mg Rectal Q6H PRN Opyd, Ilene Qua, MD      . calcium acetate (PHOSLO) capsule 667 mg  667 mg Oral TID WC Opyd, Ilene Qua, MD   667 mg at 06/07/18 0800  . cefTRIAXone  (ROCEPHIN) 2 g in sodium chloride 0.9 % 100 mL IVPB  2 g Intravenous Q24H Opyd, Ilene Qua, MD   Stopped at 06/07/18 0149  . famotidine (PEPCID) tablet 20 mg  20 mg Oral Daily Opyd, Ilene Qua, MD   20 mg at 06/07/18 0935  . gabapentin (NEURONTIN) capsule 100 mg  100 mg Oral TID Vianne Bulls, MD   100 mg at 06/07/18 0935  . hydrALAZINE (APRESOLINE) tablet 100 mg  100 mg Oral Q8H Opyd, Ilene Qua, MD      . HYDROcodone-acetaminophen (NORCO/VICODIN) 5-325 MG per tablet 1-2 tablet  1-2 tablet Oral Q4H PRN Opyd, Ilene Qua, MD      . insulin aspart (novoLOG) injection 0-5 Units  0-5 Units Subcutaneous QHS Opyd, Timothy S, MD      . insulin aspart (novoLOG) injection 0-9 Units  0-9 Units Subcutaneous TID WC Opyd, Ilene Qua, MD   2 Units at 06/07/18 [phone]  . [START ON 06/08/2018] insulin glargine (LANTUS) injection 10 Units  10 Units Subcutaneous QHS Regalado, Belkys A, MD      . insulin glargine (LANTUS) injection 5 Units  5 Units Subcutaneous Once Regalado, Belkys A, MD      . insulin glargine (LANTUS) injection 5 Units  5 Units Subcutaneous Once Regalado, Belkys A, MD      . labetalol (NORMODYNE) tablet 200 mg  200 mg Oral BID Opyd, Ilene Qua, MD   200 mg at 06/07/18 0935  . losartan (COZAAR) tablet 100 mg  100 mg Oral Daily Opyd, Ilene Qua, MD   100 mg at 06/07/18 0935  . metroNIDAZOLE (FLAGYL) IVPB 500 mg  500 mg Intravenous Q8H Opyd, Ilene Qua, MD      . ondansetron (ZOFRAN) tablet 4 mg  4 mg Oral Q6H PRN Opyd, Ilene Qua, MD       Or  . ondansetron (ZOFRAN) injection 4 mg  4 mg Intravenous Q6H PRN Opyd, Ilene Qua, MD      . senna-docusate (Senokot-S) tablet 1 tablet  1 tablet Oral QHS PRN Opyd, Ilene Qua, MD      . sodium chloride flush (NS) 0.9 % injection 3 mL  3 mL Intravenous Q12H Opyd, Ilene Qua, MD      . sodium chloride flush (NS) 0.9 % injection 3 mL  3 mL Intravenous PRN Opyd, Ilene Qua, MD      . vancomycin (VANCOCIN) 1,500 mg in sodium chloride 0.9 % 500 mL IVPB  1,500 mg Intravenous Once  Erenest Blank, RPH      . [START ON 06/08/2018] vancomycin (VANCOCIN) IVPB 1000 mg/200 mL  premix  1,000 mg Intravenous Q M,W,F-HD Erenest Blank, RPH        REVIEW OF SYSTEMS:  [X]  denotes positive finding, [ ]  denotes negative finding Cardiac  Comments:  Chest pain or chest pressure:    Shortness of breath upon exertion: x Since she started HD  Short of breath when lying flat:    Irregular heart rhythm:        Vascular    Pain in calf, thigh, or hip brought on by ambulation:    Pain in feet at night that wakes you up from your sleep:     Blood clot in your veins:    Leg swelling:  x       Pulmonary    Oxygen at home:    Productive cough:     Wheezing:         Neurologic    Sudden weakness in arms or legs:     Sudden numbness in arms or legs:     Sudden onset of difficulty speaking or slurred speech:    Temporary loss of vision in one eye:     Problems with dizziness:         Gastrointestinal    Blood in stool:     Vomited blood:         Genitourinary    Burning when urinating:     Blood in urine:        Psychiatric    Major depression:         Hematologic    Bleeding problems:    Problems with blood clotting too easily:        Skin    Rashes or ulcers: x       Constitutional    Fever or chills: x    PHYSICAL EXAM:   Vitals:   06/07/18 0015 06/07/18 0100 06/07/18 0158 06/07/18 0543  BP: (!) 145/71  128/64 135/69  Pulse: 87 88 85 79  Resp:   19 16  Temp:   99 F (37.2 C) 98.2 F (36.8 C)  TempSrc:    Oral  SpO2: 97% 98% 98% 98%   GENERAL: The patient is a well-nourished female, in no acute distress. The vital signs are documented above. CARDIAC: There is a regular rate and rhythm.  VASCULAR: I do not detect carotid bruits. On the right side which is the side with the wound, she has a palpable femoral pulse.  I cannot palpate a popliteal or pedal pulses.  She has a biphasic dorsalis pedis and anterior tibial signal with the Doppler with a brisk but  monophasic posterior tibial signal with the Doppler. On the left side there is a palpable femoral pulse and palpable dorsalis pedis pulse.  There is a biphasic dorsalis pedis and posterior tibial signal on the left. There is moderate right lower extremity swelling. She has a functioning fistula in her left upper arm.  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 PULMONARY: There is good air exchange bilaterally without wheezing or rales. ABDOMEN: Soft and non-tender with normal pitched bowel sounds.  MUSCULOSKELETAL: There are no major deformities or cyanosis. NEUROLOGIC: No focal weakness or paresthesias are detected. SKIN: There is a wound on the lateral plantar aspect of the right foot adjacent to the fifth metatarsal.  This has minimal drainage currently.  There is cellulitis. PSYCHIATRIC: The patient has a normal affect.  DATA:    ARTERIAL DOPPLER STUDY: I have reviewed the segmental Doppler pressure study that was done  on 05/13/2018.  On the right side there was a triphasic anterior tibial, posterior tibial and peroneal signal with the Doppler.  ABI was 100% with a toe pressure of 135 mmHg.  All of her toe pressure waveforms were normal on the right.  On the left side, there was a triphasic anterior tibial, posterior tibial, and peroneal signal.  ABI on the left was 100% with a toe pressure of 185 mmHg.  All of her toe pressure waveforms were normal on the left.  X-RAY RIGHT FOOT: I have reviewed the x-ray of the right foot that was done today.  This shows no fracture.  There is no definitive evidence of osteomyelitis.  There is no soft tissue gas.  LABS: Her potassium is 4.8.  White blood cell count is 13.5.  Hemoglobin is 9.7.  Platelets 240,000.  Her glucose is 211.  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  Her  Doppler study 1 month ago showed normal flow and normal toe pressures.  By my exam this has changed on the right with a brisk but monophasic posterior tibial signal.  I cannot palpate pedal pulses on the right.  Before pursuing a more aggressive work-up I think it would be worth repeating her noninvasive studies.  If these have significantly changed compared to the study a month ago that I think a CT angiogram or arteriogram would be indicated.  In addition, given her history of diabetes I think an MRI of the foot to rule out a deep space abscess is indicated.  If she had a deep space abscess, given her history of diabetes this could rapidly become a limb threatening problem.  We have discussed the importance of tobacco cessation.    I have ordered the MRI. I have ordered the f/u arterial doppler study.   ID: She is currently on broad-spectrum antibiotics including IV Rocephin, IV Flagyl, and IV vancomycin.  I will write for dressing changes to the right foot. Blood cultures pending.    END-STAGE RENAL DISEASE: This patient has end-stage renal disease and has a functioning left upper arm fistula.  She dialyzes on Monday Wednesdays and Fridays.  Deitra Mayo [facility] [phone]

## 2018-06-07 NOTE — H&P (Signed)
History and Physical    Tata Timmins OYD:[id number] DOB: [birth year] DOA: 06/06/2018  PCP: Wynn Banker, PA   Patient coming from: Home   Chief Complaint: Right foot with subjective fever, increased drainage, redness, and swelling despite recent abx   HPI: Karen [name] is a 42 y.o. female with medical history significant for insulin-dependent diabetes mellitus, ESRD on HD, hypertension, and diabetic foot ulcer, now presenting to the emergency department for evaluation of subjective fevers and increasing redness, swelling, and drainage from her right foot ulcer despite recent debridement and antibiotics.  Patient saw podiatry just over a week ago and had a ulcer at the lateral plantar aspect of her right forefoot.  This was debrided and she was treated with ciprofloxacin and clindamycin which she has just completed.  Despite this, the patient reports subjective fevers and chills, increasing redness and swelling, foul odor, and purulent drainage.  She reports completing hemodialysis on 06/05/2018 without incident.  She had ABIs performed last month that were considered unreliable due to arterial wall calcification.  ED Course: Upon arrival to the ED, patient is found to be afebrile, saturating well on room air, and with vitals otherwise stable.  Chemistry panel features a normal potassium, normal bicarbonate, BUN of 37, and glucose 173.  CBC features a leukocytosis to 15,400 and a stable normocytic anemia with hemoglobin of 10.6.  Lactic acid is reassuringly normal.  Patient was treated with vancomycin and Zosyn in the ED.  She remains hemodynamically stable and will be admitted for ongoing evaluation and management of diabetic foot ulcer with infection despite outpatient antibiotics.  Review of Systems:  All other systems reviewed and apart from HPI, are negative.  Past Medical History:  Diagnosis Date  . Diabetes mellitus without complication ([place])   . Hypertension   .  Renal disorder     History reviewed. No pertinent surgical history.   reports that she has been smoking.  She has never used smokeless tobacco. She reports that she drank alcohol. She reports that she has current or past drug history. Drug: Marijuana.  No Known Allergies  History reviewed. No pertinent family history.   Prior to Admission medications   Medication Sig Start Date End Date Taking? Authorizing Provider  amLODipine (NORVASC) 10 MG tablet Take by mouth.    [provider]  B Complex-C-Folic Acid (RENA-VITE PO) Take by mouth.    [provider]  calcium acetate (PHOSLO) 667 MG capsule Take by mouth.    [provider]  Cholecalciferol (VITAMIN D-1000 MAX ST) 1000 units tablet Take by mouth.    [provider]  collagenase (SANTYL) ointment Apply 1 application topically daily. 05/07/18   Evelina Bucy, DPM  epoetin alfa (EPOGEN) 10000 UNIT/ML injection Inject into the skin. 10/24/17   [provider]  famotidine (PEPCID) 20 MG tablet Take by mouth.    [provider]  gabapentin (NEURONTIN) 100 MG capsule Take by mouth. 01/26/18   [provider]  hydrALAZINE (APRESOLINE) 100 MG tablet Take by mouth. 10/24/17   [provider]  insulin aspart (NOVOLOG) 100 UNIT/ML injection Inject into the skin.    [provider]  insulin glargine (LANTUS) 100 UNIT/ML injection Inject into the skin.    [provider]  labetalol (NORMODYNE) 200 MG tablet Take by mouth.    [provider]  losartan (COZAAR) 100 MG tablet Take by mouth.    [provider]  ondansetron (ZOFRAN) 4 MG tablet Take by mouth. 10/24/17  [provider]    Physical Exam: Vitals:   06/06/18 2147  BP: (!) 150/71  Pulse: 72  Temp: 99.1 F (37.3 C)  TempSrc: Oral  SpO2: 100%      Constitutional: NAD, calm  Eyes: PERTLA, lids and conjunctivae normal ENMT: Mucous membranes are moist. Posterior  pharynx clear of any exudate or lesions.   Neck: normal, supple, no masses, no thyromegaly Respiratory: clear to auscultation bilaterally, no wheezing, no crackles. Normal respiratory effort.    Cardiovascular: S1 & S2 heard, regular rate and rhythm. No significant JVD. Abdomen: No distension, no tenderness, soft. Bowel sounds normal.  Musculoskeletal: no clubbing / cyanosis. No red, hot, swollen joint.    Skin: Wound at lateral aspect of right plantar forefoot with surrounding erythema, warmth, and edema. Warm, dry, well-perfused. Neurologic: No facial asymmetry. Sensation to light touch diminished in feet. Moving all extremities.  Psychiatric: Somnolent. Easily roused and oriented x 3.     Labs on Admission: I have personally reviewed following labs and imaging studies  CBC: Recent Labs  Lab 06/06/18 2156  WBC 15.4*  NEUTROABS 11.7*  HGB 10.6*  HCT 34.9*  MCV 95.9  PLT 749   Basic Metabolic Panel: Recent Labs  Lab 06/06/18 2156  NA 137  K 4.8  CL 100  CO2 26  GLUCOSE 173*  BUN 37*  CREATININE 6.98*  CALCIUM 9.4   GFR: CrCl cannot be calculated (Unknown ideal weight.). Liver Function Tests: Recent Labs  Lab 06/06/18 2156  AST 17  ALT 15  ALKPHOS 157*  BILITOT 0.8  PROT 6.7  ALBUMIN 3.2*   No results for input(s): LIPASE, AMYLASE in the last 168 hours. No results for input(s): AMMONIA in the last 168 hours. Coagulation Profile: No results for input(s): INR, PROTIME in the last 168 hours. Cardiac Enzymes: No results for input(s): CKTOTAL, CKMB, CKMBINDEX, TROPONINI in the last 168 hours. BNP (last 3 results) No results for input(s): PROBNP in the last 8760 hours. HbA1C: No results for input(s): HGBA1C in the last 72 hours. CBG: No results for input(s): GLUCAP in the last 168 hours. Lipid Profile: No results for input(s): CHOL, HDL, LDLCALC, TRIG, CHOLHDL, LDLDIRECT in the last 72 hours. Thyroid Function Tests: No results for input(s): TSH, T4TOTAL,  FREET4, T3FREE, THYROIDAB in the last 72 hours. Anemia Panel: No results for input(s): VITAMINB12, FOLATE, FERRITIN, TIBC, IRON, RETICCTPCT in the last 72 hours. Urine analysis: No results found for: COLORURINE, APPEARANCEUR, LABSPEC, PHURINE, GLUCOSEU, HGBUR, BILIRUBINUR, KETONESUR, PROTEINUR, UROBILINOGEN, NITRITE, LEUKOCYTESUR Sepsis Labs: @LABRCNTIP (procalcitonin:4,lacticidven:4) )No results found for this or any previous visit (from the past 240 hour(s)).   Radiological Exams on Admission: Dg Foot Complete Right  Result Date: 06/07/2018 CLINICAL DATA:  Ulcer on the bottom of the right foot at the head of the metatarsal EXAM: RIGHT FOOT COMPLETE - 3+ VIEW COMPARISON:  05/29/2018 FINDINGS: No fracture or malalignment. No periostitis or bone destruction. Vascular calcifications in the soft tissues. No soft tissue gas IMPRESSION: No acute osseous abnormality Electronically Signed   By: Donavan Foil M.D.   On: 06/07/2018 00:08    EKG: Not performed.   Assessment/Plan   1. Diabetic foot infection  - Presents with subjective fevers and right foot wound with drainage and increasing redness and swelling despite debridement and one week of abx that she just completed - On presentation, she is afebrile with leukocytosis to 15,400 and normal lactate  - Plain radiographs negative for osteo - She had ABI's last month but not  diagnostic d/t arterial calcifications  - Treated in ED with vancomycin and Zosyn  - Culture, check inflammatory markers, continue empiric coverage with Rocephin, Flagyl, and vancomycin, consult with wound-care    2. Insulin-dependent DM  - No A1c on file  - Managed at home with Lantus 10 units qHS and SSI with Novolog  - Check CBG's, continue Lantus and Novolog   3. ESRD - Reports completing HD on 06/05/18 without incident  - No hyperkalemia, acidosis, uremia, significant volume issues, or HTN  - SLIV, follow daily wts   4. Hypertension  - BP at goal  - Continue  Norvasc, hydralazine, losartan, and labetalol   DVT prophylaxis: sq heparin  Code Status: Full  Family Communication: Discussed with patient  Consults called: None Admission status: Inpatient     Vianne Bulls, MD [facility] Pager [phone]  If 7PM-7AM, please contact night-coverage [email or web address] Password [email or web address]  06/07/2018, 12:42 AM

## 2018-06-07 NOTE — Consult Note (Signed)
[place] Nurse wound consult note Reason for Consult: [place] Nurse was simultaneously consulted with Vascular MD and Dr. Scot Dock has already seen patient this morning.  I will defer care of the right lateral foot ulcer with necrotic tissue and foul odor involving the 4th and 5th digits to him/his service. Patient reports that her podiatrist, Dr. March Rummage, has been managing the full thickness ulceration on the left great toe.  I will resume Dr. Jolyn Nap POC (collagenase/Santyl) while patient is here in hospital. Wound type: Chronic, full thickness Pressure Injury POA: NA Measurement: 1cm x 1.8cm with depth obscured by the presence of necrotic, nonviable slough. Wound bed: As described above Drainage (amount, consistency, odor) None  Periwound:intact, dry Dressing procedure/placement/frequency: I will resume the collagenase treatment to the patient's chronic ulcer on the right foot and VVS will manage the right full thickness foot wound.   [place] nursing team will not follow, but will remain available to this patient, the nursing and medical teams.  Please re-consult if needed. Thanks, Maudie Flakes, MSN, RN, [place], Arther Abbott  Pager# [phone]

## 2018-06-07 NOTE — Plan of Care (Signed)

## 2018-06-07 NOTE — Progress Notes (Signed)
Pt came to the floor early this morning around 2 AM, never received Lantus from pharmacy, refused  heparin this morning at 6am, Pt. Education given on the implication, CBG this morning  Was 189, 2 units of Novolog given, slept all night , Vital signs stable, wound on right foot is open to air, will continue to monitor.

## 2018-06-07 NOTE — Progress Notes (Signed)
Pharmacy Antibiotic Note  Karen [name] is a 42 y.o. female admitted on 06/06/2018 with diabetic foot wound.  Pharmacy has been consulted for Vancomycin dosing. Failed PO anti-biotics. WBC elevated. ESRD on HD MWF.   Plan: Vancomycin 1500 mg IV x 1, then 1000 mg IV qHD MWF Ceftriaxone/Flagyl per MD Trend WBC, temp, HD schedule  F/U infectious work-up Drug levels as indicated  Temp (24hrs), Avg:99.1 F (37.3 C), Min:99.1 F (37.3 C), Max:99.1 F (37.3 C)  Recent Labs  Lab 06/06/18 2156 06/06/18 2224  WBC 15.4*  --   CREATININE 6.98*  --   LATICACIDVEN  --  1.62    CrCl cannot be calculated (Unknown ideal weight.).    No Known Allergies   Karen [name] 06/07/2018 1:03 AM

## 2018-06-07 NOTE — Progress Notes (Signed)
VASCULAR LAB PRELIMINARY  ARTERIAL  ABI completed: Right Posterior tibial ABI shows a decrease from 1.19 to 0.81 since study done 6/19 and  Right TBI is now abnormal.  Left ABI and TBI are normal.    RIGHT    LEFT    PRESSURE WAVEFORM  PRESSURE WAVEFORM  BRACHIAL 179 T BRACHIAL    DP 225 1.26 DP 231 1.21  AT   AT    PT 145 0.81 PT 216 1.29  PER   PER    GREAT TOE 0.53 NA GREAT TOE 0.93 NA    RIGHT LEFT  ABI 1.26 1.29     Jamilynn Whitacre, RVT 06/07/2018, 1:38 PM

## 2018-06-07 NOTE — Progress Notes (Signed)
PROGRESS NOTE    Karen [name]  FIE:[id number] DOB: 1976-07-22 DOA: 06/06/2018 PCP: Wynn Banker, PA    Brief Narrative: Karen [name] is a 42 y.o. female with medical history significant for insulin-dependent diabetes mellitus, ESRD on HD, hypertension, and diabetic foot ulcer, now presenting to the emergency department for evaluation of subjective fevers and increasing redness, swelling, and drainage from her right foot ulcer despite recent debridement and antibiotics.  Patient saw podiatry just over a week ago and had a ulcer at the lateral plantar aspect of her right forefoot.  This was debrided and she was treated with ciprofloxacin and clindamycin which she has just completed.  Despite this, the patient reports subjective fevers and chills, increasing redness and swelling, foul odor, and purulent drainage.  She reports completing hemodialysis on 06/05/2018 without incident.  She had ABIs performed last month that were considered unreliable due to arterial wall calcification.   Upon arrival to the ED, patient is found to be afebrile, saturating well on room air, and with vitals otherwise stable.  Chemistry panel features a normal potassium, normal bicarbonate, BUN of 37, and glucose 173.  CBC features a leukocytosis to 15,400 and a stable normocytic anemia with hemoglobin of 10.6.  Lactic acid is reassuringly normal.  Patient was treated with vancomycin and Zosyn in the ED.   Assessment & Plan:   Principal Problem:   Diabetic foot infection ([place]) Active Problems:   ESRD on hemodialysis ([place])   IDDM (insulin dependent diabetes mellitus) ([place])   1-Diabetic Foot infection;  Had ABI last month, some calcification/  Vascular consulted.  Podiatric consulted.  Continue with IV vancomycin, ceftriaxone and flagyl.  Follow blood culture.  Appreciate Dr Scot Dock evaluation, plan for ABI and MRI.   2-HTN; continue with Norvasc, hydralazine, cozaar, labetalol. Marland Kitchen    3-ESRD; on HD;  Nephrology consulted.   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  5 units this am and 5 unit tonight  SSI.     DVT prophylaxis: refuse heparin. SCD ordered.  Code Status: full code.  Family Communication: care discussed with patient.  Disposition Plan: to be determine.    Consultants:   Vascular.    Procedures:  HD  ABI   Antimicrobials:  Vancomycin, ceftriaxone, flagyl.    Subjective: She report subjective fever at home. Worsening redness and drainage for right foot.    Objective: Vitals:   06/07/18 0015 06/07/18 0100 06/07/18 0158 06/07/18 0543  BP: (!) 145/71  128/64 135/69  Pulse: 87 88 85 79  Resp:   19 16  Temp:   99 F (37.2 C) 98.2 F (36.8 C)  TempSrc:    Oral  SpO2: 97% 98% 98% 98%    Intake/Output Summary (Last 24 hours) at 06/07/2018 0857 Last data filed at 06/07/2018 0600 Gross per 24 hour  Intake 100 ml  Output -  Net 100 ml   There were no vitals filed for this visit.  Examination:  General exam: Appears calm and comfortable  Respiratory system: Clear to auscultation. Respiratory effort normal. Cardiovascular system: S1 & S2 heard, RRR. No JVD, murmurs, rubs, gallops or clicks. No pedal edema. Gastrointestinal system: Abdomen is nondistended, soft and nontender. No organomegaly or masses felt. Normal bowel sounds heard. Central nervous system: Alert and oriented. Blind left eye.  Extremities: right LE with edema redness, wound lateral plantar aspect close to fifth toe.   Psychiatry: Judgement and insight appear normal. Mood & affect appropriate.     Data  Reviewed: I have personally reviewed following labs and imaging studies  CBC: Recent Labs  Lab 06/06/18 2156 06/07/18 0227  WBC 15.4* 13.5*  NEUTROABS 11.7* 9.5*  HGB 10.6* 9.7*  HCT 34.9* 30.8*  MCV 95.9 93.6  PLT 269 885   Basic Metabolic Panel: Recent Labs  Lab 06/06/18 2156 06/07/18 0227  NA 137 136  K 4.8 4.8  CL 100 98  CO2 26  27  GLUCOSE 173* 211*  BUN 37* 39*  CREATININE 6.98* 7.26*  CALCIUM 9.4 9.0   GFR: CrCl cannot be calculated (Unknown ideal weight.). Liver Function Tests: Recent Labs  Lab 06/06/18 2156  AST 17  ALT 15  ALKPHOS 157*  BILITOT 0.8  PROT 6.7  ALBUMIN 3.2*   No results for input(s): LIPASE, AMYLASE in the last 168 hours. No results for input(s): AMMONIA in the last 168 hours. Coagulation Profile: No results for input(s): INR, PROTIME in the last 168 hours. Cardiac Enzymes: No results for input(s): CKTOTAL, CKMB, CKMBINDEX, TROPONINI in the last 168 hours. BNP (last 3 results) No results for input(s): PROBNP in the last 8760 hours. HbA1C: No results for input(s): HGBA1C in the last 72 hours. CBG: Recent Labs  Lab 06/07/18 0603 06/07/18 0647  GLUCAP 189* 166*   Lipid Profile: No results for input(s): CHOL, HDL, LDLCALC, TRIG, CHOLHDL, LDLDIRECT in the last 72 hours. Thyroid Function Tests: No results for input(s): TSH, T4TOTAL, FREET4, T3FREE, THYROIDAB in the last 72 hours. Anemia Panel: No results for input(s): VITAMINB12, FOLATE, FERRITIN, TIBC, IRON, RETICCTPCT in the last 72 hours. Sepsis Labs: Recent Labs  Lab 06/06/18 2224  LATICACIDVEN 1.62    No results found for this or any previous visit (from the past 240 hour(s)).       Radiology Studies: Dg Foot Complete Right  Result Date: 06/07/2018 CLINICAL DATA:  Ulcer on the bottom of the right foot at the head of the metatarsal EXAM: RIGHT FOOT COMPLETE - 3+ VIEW COMPARISON:  05/29/2018 FINDINGS: No fracture or malalignment. No periostitis or bone destruction. Vascular calcifications in the soft tissues. No soft tissue gas IMPRESSION: No acute osseous abnormality Electronically Signed   By: Donavan Foil M.D.   On: 06/07/2018 00:08        Scheduled Meds: . amLODipine  10 mg Oral Daily  . calcium acetate  667 mg Oral TID WC  . famotidine  20 mg Oral Daily  . gabapentin  100 mg Oral TID  . heparin   5,000 Units Subcutaneous Q8H  . hydrALAZINE  100 mg Oral Q8H  . insulin aspart  0-5 Units Subcutaneous QHS  . insulin aspart  0-9 Units Subcutaneous TID WC  . insulin glargine  5 Units Subcutaneous QHS  . insulin glargine  5 Units Subcutaneous Once  . labetalol  200 mg Oral BID  . losartan  100 mg Oral Daily  . sodium chloride flush  3 mL Intravenous Q12H   Continuous Infusions: . sodium chloride    . cefTRIAXone (ROCEPHIN)  IV Stopped (06/07/18 0149)  . metronidazole    . vancomycin    . [START ON 06/08/2018] vancomycin       LOS: 0 days    Time spent: 35 minutes.     Elmarie Shiley, MD [facility] Pager [phone]  If 7PM-7AM, please contact night-coverage [email or web address] Password [facility] 06/07/2018, 8:57 AM

## 2018-06-07 NOTE — Progress Notes (Signed)
Wound care done on left foot, no drainage noted. Dressing changed to right foot, tolerated well, Pt has decreased sensation on bilateral legs.

## 2018-06-07 NOTE — Progress Notes (Signed)
Patient transported to CT 

## 2018-06-08 ENCOUNTER — Other Ambulatory Visit (HOSPITAL_COMMUNITY): Payer: Medicaid Other

## 2018-06-08 ENCOUNTER — Other Ambulatory Visit: Payer: Self-pay

## 2018-06-08 ENCOUNTER — Encounter (HOSPITAL_COMMUNITY)
Admission: EM | Disposition: A | Discharge status: Home Health | Payer: Self-pay | Source: Home / Self Care | Attending: Internal Medicine

## 2018-06-08 ENCOUNTER — Encounter (HOSPITAL_COMMUNITY): Payer: Self-pay | Admitting: General Practice

## 2018-06-08 ENCOUNTER — Inpatient Hospital Stay (HOSPITAL_COMMUNITY): Payer: Medicare Other

## 2018-06-08 DIAGNOSIS — I739 Peripheral vascular disease, unspecified: Secondary | ICD-10-CM

## 2018-06-08 DIAGNOSIS — M86672 Other chronic osteomyelitis, left ankle and foot: Secondary | ICD-10-CM

## 2018-06-08 DIAGNOSIS — E11628 Type 2 diabetes mellitus with other skin complications: Secondary | ICD-10-CM

## 2018-06-08 HISTORY — PX: ABDOMINAL AORTOGRAM W/LOWER EXTREMITY: CATH118223

## 2018-06-08 LAB — GLUCOSE, CAPILLARY
GLUCOSE-CAPILLARY: 109 mg/dL — AB (ref 70–99)
Glucose-Capillary: 163 mg/dL — ABNORMAL HIGH (ref 70–99)
Glucose-Capillary: 131 mg/dL — ABNORMAL HIGH (ref 70–99)

## 2018-06-08 LAB — SURGICAL PCR SCREEN
MRSA, PCR: POSITIVE — AB
STAPHYLOCOCCUS AUREUS: POSITIVE — AB

## 2018-06-08 SURGERY — ABDOMINAL AORTOGRAM W/LOWER EXTREMITY
Anesthesia: LOCAL

## 2018-06-08 MED ORDER — SODIUM CHLORIDE 0.9% FLUSH: 3.0000 mL | INTRAVENOUS | Status: DC | PRN

## 2018-06-08 MED ORDER — MIDAZOLAM HCL 2 MG/2ML IJ SOLN
INTRAMUSCULAR | Status: AC
  Filled 2018-06-08: qty 2

## 2018-06-08 MED ORDER — IOPAMIDOL (ISOVUE-370) INJECTION 76%
100.0000 mL | Freq: Once | INTRAVENOUS | Status: AC | PRN
  Administered 2018-06-08: 100 mL via INTRAVENOUS

## 2018-06-08 MED ORDER — ONDANSETRON HCL 4 MG/2ML IJ SOLN: 4.0000 mg | Freq: Four times a day (QID) | INTRAMUSCULAR | Status: DC | PRN

## 2018-06-08 MED ORDER — DOXERCALCIFEROL 4 MCG/2ML IV SOLN
INTRAVENOUS | Status: AC
  Administered 2018-06-08: 5 ug via INTRAVENOUS
  Filled 2018-06-08: qty 4

## 2018-06-08 MED ORDER — JUVEN PO PACK
1.0000 | PACK | Freq: Two times a day (BID) | ORAL | Status: DC
  Administered 2018-06-08 – 2018-06-10 (×6): 1 via ORAL
  Filled 2018-06-08 (×9): qty 1

## 2018-06-08 MED ORDER — MIDAZOLAM HCL 2 MG/2ML IJ SOLN
INTRAMUSCULAR | Status: DC | PRN
  Administered 2018-06-08: 1 mg via INTRAVENOUS

## 2018-06-08 MED ORDER — LIDOCAINE HCL (PF) 1 % IJ SOLN
INTRAMUSCULAR | Status: AC
  Filled 2018-06-08: qty 30

## 2018-06-08 MED ORDER — IODIXANOL 320 MG/ML IV SOLN
INTRAVENOUS | Status: DC | PRN
  Administered 2018-06-08: 135 mL via INTRA_ARTERIAL

## 2018-06-08 MED ORDER — LIDOCAINE HCL (PF) 1 % IJ SOLN
INTRAMUSCULAR | Status: DC | PRN
  Administered 2018-06-08: 18 mL

## 2018-06-08 MED ORDER — LABETALOL HCL 5 MG/ML IV SOLN
10.0000 mg | INTRAVENOUS | Status: DC | PRN
  Administered 2018-06-08: 10 mg via INTRAVENOUS
  Filled 2018-06-08: qty 4

## 2018-06-08 MED ORDER — FENTANYL CITRATE (PF) 100 MCG/2ML IJ SOLN
INTRAMUSCULAR | Status: DC | PRN
  Administered 2018-06-08: 50 ug via INTRAVENOUS

## 2018-06-08 MED ORDER — DARBEPOETIN ALFA 40 MCG/0.4ML IJ SOSY
40.0000 ug | PREFILLED_SYRINGE | INTRAMUSCULAR | Status: DC
  Administered 2018-06-12: 40 ug via INTRAVENOUS
  Filled 2018-06-08: qty 0.4

## 2018-06-08 MED ORDER — FENTANYL CITRATE (PF) 100 MCG/2ML IJ SOLN
INTRAMUSCULAR | Status: AC
  Filled 2018-06-08: qty 2

## 2018-06-08 MED ORDER — ACETAMINOPHEN 325 MG PO TABS
650.0000 mg | ORAL_TABLET | ORAL | Status: DC | PRN
  Administered 2018-06-09: 650 mg via ORAL
  Filled 2018-06-08: qty 2

## 2018-06-08 MED ORDER — HYDRALAZINE HCL 20 MG/ML IJ SOLN: 5.0000 mg | INTRAMUSCULAR | Status: DC | PRN

## 2018-06-08 MED ORDER — SODIUM CHLORIDE 0.9 % IV SOLN
INTRAVENOUS | Status: AC | PRN
  Administered 2018-06-08: 10 mL/h via INTRA_ARTERIAL

## 2018-06-08 MED ORDER — SODIUM CHLORIDE 0.9% FLUSH
3.0000 mL | Freq: Two times a day (BID) | INTRAVENOUS | Status: DC
  Administered 2018-06-10: 3 mL via INTRAVENOUS

## 2018-06-08 MED ORDER — SODIUM CHLORIDE 0.9 % IV SOLN: 250.0000 mL | INTRAVENOUS | Status: DC | PRN

## 2018-06-08 SURGICAL SUPPLY — 12 items
CATH OMNI FLUSH 5F 65CM (CATHETERS) ×2 IMPLANT
CATH SOFT-VU ST 4F 90CM (CATHETERS) ×2 IMPLANT
COVER PRB 48X5XTLSCP FOLD TPE (BAG) ×1 IMPLANT
COVER PROBE 5X48 (BAG) ×1
KIT MICROPUNCTURE NIT STIFF (SHEATH) ×2 IMPLANT
KIT PV (KITS) ×2 IMPLANT
SHEATH PINNACLE 5F 10CM (SHEATH) ×2 IMPLANT
SYR MEDRAD MARK V 150ML (SYRINGE) ×2 IMPLANT
TRANSDUCER W/STOPCOCK (MISCELLANEOUS) ×2 IMPLANT
TRAY PV CATH (CUSTOM PROCEDURE TRAY) ×2 IMPLANT
WIRE BENTSON .035X145CM (WIRE) ×2 IMPLANT
WIRE ROSEN-J .035X260CM (WIRE) ×2 IMPLANT

## 2018-06-08 NOTE — Progress Notes (Addendum)
Site area: LFA Site Prior to Removal:  Level 0 Pressure Applied For: 20 min Manual:   yes Patient Status During Pull:  stable Post Pull Site:  Level 0 Post Pull Instructions Given:  yes Post Pull Pulses Present: doppler-PT Dressing Applied:  clear Bedrest begins @ 1135 till 1535 Comments:

## 2018-06-08 NOTE — Progress Notes (Signed)
[facility] Progress Note  Subjective: no c/o's, sp a-gram this am  Vitals:   06/08/18 1125 06/08/18 1130 06/08/18 1135 06/08/18 1206  BP: (!) 160/53 (!) 149/54 (!) 152/56 (!) 161/71  Pulse: 73 73 73 73  Resp: 15 18 15    Temp:    98.2 F (36.8 C)  TempSrc:    Axillary  SpO2: 95% 96% 95% 90%  Weight:    88 kg (194 lb 0.1 oz)  Height:    5\' 5"  (1.651 m)    Inpatient medications: . amLODipine  10 mg Oral Daily  . calcium acetate  1,334 mg Oral TID WC  . Chlorhexidine Gluconate Cloth  6 each Topical Q0600  . collagenase   Topical Daily  . [START ON 06/11/2018] darbepoetin (ARANESP) injection - DIALYSIS  40 mcg Intravenous Q Thu-HD  . doxercalciferol  5 mcg Intravenous Q M,W,F-HD  . famotidine  20 mg Oral Daily  . gabapentin  100 mg Oral TID  . hydrALAZINE  100 mg Oral Q8H  . insulin aspart  0-5 Units Subcutaneous QHS  . insulin aspart  0-9 Units Subcutaneous TID WC  . insulin glargine  10 Units Subcutaneous QHS  . labetalol  200 mg Oral BID  . losartan  100 mg Oral Daily  . multivitamin  1 tablet Oral QHS  . sodium chloride flush  3 mL Intravenous Q12H  . sodium chloride flush  3 mL Intravenous Q12H   . sodium chloride    . sodium chloride    . sodium chloride    . sodium chloride    . cefTRIAXone (ROCEPHIN)  IV 2 g (06/08/18 0143)  . metronidazole 500 mg (06/08/18 0857)  . vancomycin     sodium chloride, sodium chloride, sodium chloride, sodium chloride, acetaminophen, alteplase, hydrALAZINE, HYDROcodone-acetaminophen, labetalol, lidocaine (PF), lidocaine-prilocaine, ondansetron (ZOFRAN) IV, ondansetron **OR** [DISCONTINUED] ondansetron (ZOFRAN) IV, pentafluoroprop-tetrafluoroeth, polyvinyl alcohol, senna-docusate, sodium chloride flush, sodium chloride flush  Exam: Alert, no distress  no jvd  Chest cta bilat  Cor reg no mrg  Abd soft ntnd no ascites obese  ext R foot wrapped, no LE edema  NF, ox 3  LUA AVF+bruit  Dialysis: [facility] MWF  3h 36min    400/600  82kg  2/2.5 bath LUA AVF  Hep 2300 (pt refusing of late due to vitreous hemorrhage) - venofer 50 /wk - hect 5 - aranesp 40 last 7/5 - recent Hb 10.3 tsat 13%  ferr 528  pth 201  P 6      Impression: 1 R foot ulcer/ infection: on IV abx, for toe amputation tomorrow 2 ESRD on HD MWF. HD today 3  Volume up 6kg by wts 4  DM severe complications 5  Anemia ckd - hb 9.6, gets darbe 40 outpt last 7.5k, no need esa here 6  Hx MRSA endocarditis 7  HTN 8  Chronic back pain - disabled from this and other issues, mostly nonamb  Plan - HD today, max UF as Ronnie Derby MD [employer] pager [phone]   06/08/2018, 1:15 PM   Recent Labs  Lab 06/06/18 2156 06/07/18 0227 06/07/18 2206  NA 137 136 132*  K 4.8 4.8 5.5*  CL 100 98 96*  CO2 26 27 24   GLUCOSE 173* 211* 243*  BUN 37* 39* 52*  CREATININE 6.98* 7.26* 8.69*  CALCIUM 9.4 9.0 9.1  PHOS  --   --  5.4*  ALBUMIN 3.2*  --  2.8*   Recent Labs  Lab 06/06/18 2156  AST 17  ALT 15  ALKPHOS 157*  BILITOT 0.8  PROT 6.7   Recent Labs  Lab 06/06/18 2156 06/07/18 0227 06/07/18 2206  WBC 15.4* 13.5* 11.0*  NEUTROABS 11.7* 9.5*  --   HGB 10.6* 9.7* 9.6*  HCT 34.9* 30.8* 30.6*  MCV 95.9 93.6 93.9  PLT 269 240 245   Iron/TIBC/Ferritin/ %Sat No results found for: IRON, TIBC, FERRITIN, IRONPCTSAT

## 2018-06-08 NOTE — Progress Notes (Signed)
Pt has returned from CT. No complaints from pt. Call light within reach.

## 2018-06-08 NOTE — Progress Notes (Signed)
Crystal in Cath lab notified that patient was positive for MRSA per PCR

## 2018-06-08 NOTE — Progress Notes (Signed)
PROGRESS NOTE    Karen [name]  KGU:[id number] DOB: 1976/05/09 DOA: 06/06/2018 PCP: Wynn Banker, PA    Brief Narrative: Karen [name] is a 42 y.o. female with medical history significant for insulin-dependent diabetes mellitus, ESRD on HD, hypertension, and diabetic foot ulcer, now presenting to the emergency department for evaluation of subjective fevers and increasing redness, swelling, and drainage from her right foot ulcer despite recent debridement and antibiotics.  Patient saw podiatry just over a week ago and had a ulcer at the lateral plantar aspect of her right forefoot.  This was debrided and she was treated with ciprofloxacin and clindamycin which she has just completed.  Despite this, the patient reports subjective fevers and chills, increasing redness and swelling, foul odor, and purulent drainage.  She reports completing hemodialysis on 06/05/2018 without incident.  She had ABIs performed last month that were considered unreliable due to arterial wall calcification.   Upon arrival to the ED, patient is found to be afebrile, saturating well on room air, and with vitals otherwise stable.  Chemistry panel features a normal potassium, normal bicarbonate, BUN of 37, and glucose 173.  CBC features a leukocytosis to 15,400 and a stable normocytic anemia with hemoglobin of 10.6.  Lactic acid is reassuringly normal.  Patient was treated with vancomycin and Zosyn in the ED.   Assessment & Plan:   Principal Problem:   Diabetic foot infection ([place]) Active Problems:   ESRD on hemodialysis ([facility])   IDDM (insulin dependent diabetes mellitus) ([place])   1-Diabetic Foot infection; cellulitis Right lower extremities cellulitis, Osteomyelitis right Fifth toe;  Had ABI last month, some calcification/  Vascular consulted.  Podiatric consulted.  Continue with IV vancomycin, ceftriaxone and flagyl.  Blood culture pending./  ABI different result compare to last month ABI.  MRI  negative for abscess. Osteomyelitis right Fifth Toe.  CT angio LE; pending results. Vascular review CT and recommended arteriogram.  Arteriogram ; per vascular patient has adequate circulation to be able to heal toe amputation.  Plan for OR tomorrow by Dr Scot Dock.  WBC trending down.   2-HTN; continue with Norvasc, hydralazine, cozaar, labetalol. Marland Kitchen   3-ESRD; on HD;  Nephrology consulted.  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  5 units this am and 5 unit tonight  SSI.     DVT prophylaxis: refuse heparin. SCD ordered.  Code Status: full code.  Family Communication: care discussed with patient.  Disposition Plan: to be determine.    Consultants:   Vascular.    Procedures:  HD  ABI   Antimicrobials:  Vancomycin, ceftriaxone, flagyl.    Subjective: Pain controlled. Denies chest pain and dyspnea.   Objective: Vitals:   06/07/18 1218 06/07/18 2104 06/08/18 0500 06/08/18 0548  BP: (!) 169/74 (!) 154/78  127/69  Pulse: 84 92  81  Resp: 18     Temp: 98.3 F (36.8 C) 100.1 F (37.8 C)  98.1 F (36.7 C)  TempSrc: Oral Oral  Oral  SpO2: 98% 96%  95%  Weight:   82.8 kg (182 lb 8.7 oz)     Intake/Output Summary (Last 24 hours) at 06/08/2018 0846 Last data filed at 06/08/2018 0400 Gross per 24 hour  Intake 522.87 ml  Output -  Net 522.87 ml   Filed Weights   06/08/18 0500  Weight: 82.8 kg (182 lb 8.7 oz)    Examination:  General exam: NAD Respiratory system: CTA Cardiovascular system: S 1, S  2 RRR Gastrointestinal system: BS present, soft, nt Central nervous system: Alert and oriented. Blind left eye.  Extremities: Right LE with dressing.    Data Reviewed: I have personally reviewed following labs and imaging studies  CBC: Recent Labs  Lab 06/06/18 2156 06/07/18 0227 06/07/18 2206  WBC 15.4* 13.5* 11.0*  NEUTROABS 11.7* 9.5*  --   HGB 10.6* 9.7* 9.6*  HCT 34.9* 30.8* 30.6*  MCV 95.9 93.6 93.9  PLT 269 240 341    Basic Metabolic Panel: Recent Labs  Lab 06/06/18 2156 06/07/18 0227 06/07/18 2206  NA 137 136 132*  K 4.8 4.8 5.5*  CL 100 98 96*  CO2 26 27 24   GLUCOSE 173* 211* 243*  BUN 37* 39* 52*  CREATININE 6.98* 7.26* 8.69*  CALCIUM 9.4 9.0 9.1  PHOS  --   --  5.4*   GFR: CrCl cannot be calculated (Unknown ideal weight.). Liver Function Tests: Recent Labs  Lab 06/06/18 2156 06/07/18 2206  AST 17  --   ALT 15  --   ALKPHOS 157*  --   BILITOT 0.8  --   PROT 6.7  --   ALBUMIN 3.2* 2.8*   No results for input(s): LIPASE, AMYLASE in the last 168 hours. No results for input(s): AMMONIA in the last 168 hours. Coagulation Profile: No results for input(s): INR, PROTIME in the last 168 hours. Cardiac Enzymes: No results for input(s): CKTOTAL, CKMB, CKMBINDEX, TROPONINI in the last 168 hours. BNP (last 3 results) No results for input(s): PROBNP in the last 8760 hours. HbA1C: No results for input(s): HGBA1C in the last 72 hours. CBG: Recent Labs  Lab 06/07/18 0647 06/07/18 1213 06/07/18 1617 06/07/18 2108 06/08/18 0550  GLUCAP 166* 266* 244* 222* 163*   Lipid Profile: No results for input(s): CHOL, HDL, LDLCALC, TRIG, CHOLHDL, LDLDIRECT in the last 72 hours. Thyroid Function Tests: No results for input(s): TSH, T4TOTAL, FREET4, T3FREE, THYROIDAB in the last 72 hours. Anemia Panel: No results for input(s): VITAMINB12, FOLATE, FERRITIN, TIBC, IRON, RETICCTPCT in the last 72 hours. Sepsis Labs: Recent Labs  Lab 06/06/18 2224  LATICACIDVEN 1.62    No results found for this or any previous visit (from the past 240 hour(s)).       Radiology Studies: Mr Foot Right Wo Contrast  Result Date: 06/07/2018 CLINICAL DATA:  Diabetic patient with a wound near the distal fifth metatarsal of the right foot which the patient first noticed in the past month. EXAM: MRI OF THE RIGHT FOREFOOT WITHOUT CONTRAST TECHNIQUE: Multiplanar, multisequence MR imaging of the right forefoot was  performed. No intravenous contrast was administered. COMPARISON:  Plain films right foot 06/06/2018. FINDINGS: Bones/Joint/Cartilage Marrow edema is seen in throughout the fifth toe and in the distal 1.2 cm of the fifth metatarsal consistent with osteomyelitis. Bone marrow signal is otherwise unremarkable. No joint effusion. Ligaments Intact.  New Muscles and Tendons No intramuscular fluid collection. There is some atrophy of intrinsic musculature the foot. Soft tissues Subcutaneous edema is present about the foot and worst dorsally. Skin wound is seen at approximately the level of the fifth MTP joint. No abscess is identified. IMPRESSION: Findings consistent with osteomyelitis in the distal 1.2 cm of the fifth metatarsal and throughout the fifth toe. Skin wound adjacent to the head of the fifth metatarsal is identified. Negative for abscess or septic joint. Diffuse subcutaneous edema about the foot consistent with dependent change and/or cellulitis. Electronically Signed   By: Inge Rise M.D.   On: 06/07/2018 14:22  Dg Foot Complete Right  Result Date: 06/07/2018 CLINICAL DATA:  Ulcer on the bottom of the right foot at the head of the metatarsal EXAM: RIGHT FOOT COMPLETE - 3+ VIEW COMPARISON:  05/29/2018 FINDINGS: No fracture or malalignment. No periostitis or bone destruction. Vascular calcifications in the soft tissues. No soft tissue gas IMPRESSION: No acute osseous abnormality Electronically Signed   By: Donavan Foil M.D.   On: 06/07/2018 00:08        Scheduled Meds: . amLODipine  10 mg Oral Daily  . calcium acetate  1,334 mg Oral TID WC  . Chlorhexidine Gluconate Cloth  6 each Topical Q0600  . collagenase   Topical Daily  . [START ON 06/11/2018] darbepoetin (ARANESP) injection - DIALYSIS  40 mcg Intravenous Q Thu-HD  . doxercalciferol  5 mcg Intravenous Q M,W,F-HD  . famotidine  20 mg Oral Daily  . gabapentin  100 mg Oral TID  . hydrALAZINE  100 mg Oral Q8H  . insulin aspart  0-5  Units Subcutaneous QHS  . insulin aspart  0-9 Units Subcutaneous TID WC  . insulin glargine  10 Units Subcutaneous QHS  . iopamidol      . labetalol  200 mg Oral BID  . losartan  100 mg Oral Daily  . multivitamin  1 tablet Oral QHS  . sodium chloride flush  3 mL Intravenous Q12H   Continuous Infusions: . sodium chloride    . sodium chloride    . sodium chloride    . cefTRIAXone (ROCEPHIN)  IV 2 g (06/08/18 0143)  . metronidazole 500 mg (06/08/18 0228)  . vancomycin       LOS: 1 day    Time spent: 35 minutes.     Elmarie Shiley, MD [facility] Pager [phone]  If 7PM-7AM, please contact night-coverage [email or web address] Password [email or web address] 06/08/2018, 8:46 AM

## 2018-06-08 NOTE — Consult Note (Signed)
   HPI: 42 year old female with a history of DM, PVD, and an ulcer to the  fifth toe right foot was admitted over the weekend for cellulitis and acute changes to the right foot.  Vascular studies were ordered as well as an MRI to the right foot which was consistent with osteomyelitis to the fifth toe as well as the fifth metatarsal head right foot.  Podiatry was consulted over the weekend and was seen this morning.  Patient is also been evaluated by vascular Dr. Joylene Igo with [facility].  Patient scheduled today for angiogram right lower extremity with Dr. Servando Snare.   Past Medical History:  Diagnosis Date  . Diabetes mellitus without complication ([place])   . Hypertension   . Renal disorder      Physical Exam: ULCER RT 5TH TOE WITH OSTEOMYELITIS MRI findings consistent with osteomyelitis encompassing the fifth toe and fifth metatarsal head right foot.  There is also stable superficial ulcer to the left foot.   Assessment: Cellulitis of right lower extremity Osteomyelitis right fifth toe: Patient scheduled for fifth toe amputation by Dr. Scot Dock, planned for tomorrow, 06/09/2018.    Plan of Care:  Since vascular is planning for the surgical toe amputation with debridement of the foot there appears to be no need for podiatry services at this time.  Podiatry will sign off.  Please reconsult if needed.  Cell: [phone]      Edrick Kins, DPM [facility]  Dr. Edrick Kins, DPM    [address], [place] [address]                Office [phone]  Fax [phone]

## 2018-06-08 NOTE — Progress Notes (Signed)
   I reviewed the above notes discussed with Drs. Dixon and evidence and we will plan for angiogram from a left femoral approach to evaluate bilateral lower extremities possible treatment of the right.  Iantha Titsworth C. Donzetta Matters, MD [facility] Office: [phone] Pager: [phone]

## 2018-06-08 NOTE — Progress Notes (Signed)
VASCULAR SURGERY ADDENDUM:  I have reviewed the arteriogram today.  The patient has in-line flow through the anterior tibial artery onto the foot.  I think she has adequate circulation to heal a toe amputation.  I have scheduled her for ray amputation of the right fifth toe tomorrow for osteomyelitis of the right fifth toe.  I have written her preop orders.  Hopefully she will get dialyzed today she is scheduled for surgery tomorrow.  Deitra Mayo, MD, [place] [phone] Office: [phone]

## 2018-06-08 NOTE — Progress Notes (Signed)
Inpatient Diabetes Program Recommendations  AACE/ADA: New Consensus Statement on Inpatient Glycemic Control (2015)  Target Ranges:  Prepandial:   less than 140 mg/dL      Peak postprandial:   less than 180 mg/dL (1-2 hours)      Critically ill patients:  140 - 180 mg/dL    Review of Glycemic Control  Diabetes history: DM 2 Outpatient Diabetes medications: Lantus 10 units, Novolog 2-6 units tid with meals Current orders for Inpatient glycemic control: Lantus 10 units qhs, Novolog 0-9 units tid, Novolog 0-5 units qhs  Inpatient Diabetes Program Recommendations:    Consulted for lower extremity wound. Admitting glucose in the 170's. Consider an A1c level to determine glucose control over the past 2-3 months. Lantus 5 units x2 given yesterday and is ordered for 10 units qhs, patient's home dose. Fasting glucose 160's this am. Will watch trends for now.  Thanks,  Tama Headings RN, MSN, BC-ADM, [facility] Inpatient Diabetes Coordinator Team Pager [phone] (8a-5p)

## 2018-06-08 NOTE — Clinical Social Work Note (Signed)
CSW acknowledges consult "Access Meds for Discharge." Please consult RNCM.  CSW signing off. Consult again if any social work needs arise.  Dayton Scrape, [place]

## 2018-06-08 NOTE — Op Note (Signed)
    Patient name: Karen [name] MRN: [id number] DOB: 02-26-1976 Sex: female  06/08/2018 Pre-operative Diagnosis: Right small toe ulceration, end-stage renal disease Post-operative diagnosis:  Same Surgeon:  Eda Paschal. Donzetta Matters, MD Procedure Performed: 1.  Ultrasound guided cannulation left common femoral artery 2.  Aortogram bilateral lower extremity runoff  3.  Selection of right popliteal artery 4.  Moderate sedation with fentanyl Versed for 25 minutes   Indications: 42 year old female who has a right small toe ulceration.  She is indicated for angiogram bilateral lower extremity runoff possible intervention on the right.  Findings: The aorta and iliac segments are small but there is no flow-limiting stenosis.  On the left leg she has run off which is very quick at least via the anterior tibial artery.  On the right side she possibly has a non-flow-limiting above the knee popliteal artery stenosis.  The anterior tibial runoff to the foot gives off the DP.  Posterior tibial artery is initially stenotic at approximately 70% but terminates at the ankle.  No intervention was undertaken.   Procedure:  The patient was identified in the holding area and taken to room 8.  The patient was then placed supine on the table and prepped and draped in the usual sterile fashion.  A time out was called.  Ultrasound was used to evaluate the left common femoral artery.  It was patent and an image was saved to the permanent record.  A digital ultrasound image was acquired.  A micropuncture needle was used to access the left common femoral artery under ultrasound guidance.  An 018 wire was advanced without resistance and a micropuncture sheath was placed.  The 018 wire was removed and a benson wire was placed.  The micropuncture sheath was exchanged for a 5 french sheath.  An omniflush catheter was advanced over the wire to the level of L-1.  An abdominal angiogram was obtained followed by bilateral lower extremity  runoff.  We then crossed the bifurcation using a Bentson wire and Omni catheter.  I then exchanged for a straight catheter used a Rosen wire to get to the level of the popliteal artery on the right.  Angiogram was performed of the leg below the knee and this demonstrated the above findings.  I elected to not intervene.  She tolerated procedure well without immediate comp occasion.  Catheter and wire were removed sheath we pulled in postoperative holding.  Contrast 135 cc.   Bionca Mckey C. Donzetta Matters, MD [facility] Office: [phone] Pager: [phone]

## 2018-06-08 NOTE — Progress Notes (Signed)
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  She does have evidence of infrainguinal arterial occlusive disease.  For this reason I ordered a CT angiogram which is not yet been read by radiology but she does appear to have significant calcific disease on the right and evidence of infrainguinal arterial occlusive disease.  The timing of the bolus does not appear to be ideal and it is difficult to fully assess the circulation based on her CT scan.  For this reason I recommended that she undergo arteriography today by Dr. Servando Snare.  I have discussed this with the patient.  I have discussed the indications for the procedure and the potential complications, including but not limited to bleeding, arterial injury or thrombosis.  She is agreeable to proceed.  OSTEOMYELITIS RIGHT FIFTH TOE: In addition, she has osteomyelitis of the fifth toe by MRI and she will need right fifth toe amputation which I will schedule for tomorrow if she is agreeable.  When I discussed this with her this morning she was very reluctant to consider amputation of the toe.  She would require a fairly extensive amputation of the fifth toe given the osteomyelitis of the toe and the extent of the wound.  END-STAGE RENAL DISEASE: I have discussed with the dialysis unit that she is having an arteriogram this morning so hopefully they will be able to dialyze her this afternoon.  ID: She is currently on broad-spectrum antibiotics including IV Rocephin, IV Flagyl, and IV vancomycin.  SUBJECTIVE:   No specific complaints this morning.  ROS: No chest pain or shortness of breath.  PHYSICAL EXAM:   Vitals:   06/07/18 1218 06/07/18 2104 06/08/18 0500 06/08/18 0548  BP: (!) 169/74 (!) 154/78  127/69  Pulse: 84 92  81  Resp: 18     Temp: 98.3 F (36.8 C) 100.1 F (37.8 C)   98.1 F (36.7 C)  TempSrc: Oral Oral  Oral  SpO2: 98% 96%  95%  Weight:   182 lb 8.7 oz (82.8 kg)    No significant drainage on her dressing on the right foot. Lungs are clear. Palpable right femoral pulse.  LABS:   Lab Results  Component Value Date   WBC 11.0 (H) 06/07/2018   HGB 9.6 (L) 06/07/2018   HCT 30.6 (L) 06/07/2018   MCV 93.9 06/07/2018   PLT 245 06/07/2018   Lab Results  Component Value Date   CREATININE 8.69 (H) 06/07/2018   CBG (last 3)  Recent Labs    06/07/18 1213 06/07/18 1617 06/07/18 2108  GLUCAP 266* 244* 222*   MRI: I have reviewed her MRI which shows osteomyelitis of the right fifth toe.  ARTERIAL DOPPLER STUDY: I have independently interpreted her Doppler study yesterday.  This shows a change compared to the study a month ago.  She has evidence of infrainguinal arterial occlusive disease.  CT ANGIOGRAM: I have reviewed the images of her CT angiogram.  This is not yet been read by radiology.  The timing of the bolus is not optimal.  She does have evidence of infrainguinal arterial occlusive disease with significant calcific disease.  PROBLEM LIST:    Principal Problem:   Diabetic foot infection ([place]) Active Problems:   ESRD on hemodialysis ([place])   IDDM (insulin dependent diabetes mellitus) ([place])   CURRENT MEDS:   . amLODipine  10 mg  Oral Daily  . calcium acetate  1,334 mg Oral TID WC  . Chlorhexidine Gluconate Cloth  6 each Topical Q0600  . collagenase   Topical Daily  . [START ON 06/11/2018] darbepoetin (ARANESP) injection - DIALYSIS  40 mcg Intravenous Q Thu-HD  . doxercalciferol  5 mcg Intravenous Q M,W,F-HD  . famotidine  20 mg Oral Daily  . gabapentin  100 mg Oral TID  . hydrALAZINE  100 mg Oral Q8H  . insulin aspart  0-5 Units Subcutaneous QHS  . insulin aspart  0-9 Units Subcutaneous TID WC  . insulin glargine  10 Units Subcutaneous QHS  . iopamidol      . labetalol  200 mg Oral BID  . losartan  100 mg Oral Daily  .  multivitamin  1 tablet Oral QHS  . sodium chloride flush  3 mL Intravenous Q12H    Deitra Mayo Beeper: [phone] Office: [phone] 06/08/2018

## 2018-06-08 NOTE — Progress Notes (Signed)
Wounds on left and right feet were redressed.

## 2018-06-08 NOTE — Progress Notes (Signed)
Initial Nutrition Assessment  DOCUMENTATION CODES:   Obesity unspecified  INTERVENTION:   -1 packet Juven BID, each packet provides 80 calories, 8 grams of carbohydrate, and 14 grams of amino acids; supplement contains CaHMB, glutamine, and arginine, to promote wound healing -Continue renal MVI daily  NUTRITION DIAGNOSIS:   Increased nutrient needs related to wound healing as evidenced by estimated needs.  GOAL:   Patient will meet greater than or equal to 90% of their needs  MONITOR:   PO intake, Supplement acceptance, Weight trends, Skin, I & O's  REASON FOR ASSESSMENT:   Consult Wound healing  ASSESSMENT:   Karen [name] is a 42 y.o. female with medical history significant for insulin-dependent diabetes mellitus, ESRD on HD, hypertension, and diabetic foot ulcer, now presenting to the emergency department for evaluation of subjective fevers and increasing redness, swelling, and drainage from her right foot ulcer despite recent debridement and antibiotics.  Pt admitted with rt lower extremity ulcer.   7/8- s/p arteriogram; plan for rt 5th toe amputation on 06/09/18  Pt very somnolent, snoring audibly at time of visit (just returned from procedure). No family present to provide additional history.   Pt with good appetite; noted meal completion 100%. Reviewed wt hx from [employer]; wt on 10/22/17 197#.   No recent Hgb A1c readings available to assess. PTA DM medications 10 units insulin glargine daily, 2-6 units insulin aspart TID with meals.  Pt with increased nutrient needs related to wound healing. Would benefit from supplements.   Current orders for Inpatient glycemic control: Lantus 10 units qhs, Novolog 0-9 units tid, Novolog 0-5 units qhs  Labs reviewed: Na: 132, K: 5.5, Phos: 5.4, CBGS: 163 (inpatient orders for glycemic control are 10 units insulin glargine daily, 0-9 units insulin aspart TID with meal,s and 0-5 units insulin aspart q HS).    NUTRITION - FOCUSED PHYSICAL EXAM:    Most Recent Value  Orbital Region  No depletion  Upper Arm Region  No depletion  Thoracic and Lumbar Region  No depletion  Buccal Region  No depletion  Temple Region  No depletion  Clavicle Bone Region  No depletion  Clavicle and Acromion Bone Region  No depletion  Scapular Bone Region  No depletion  Dorsal Hand  No depletion  Patellar Region  No depletion  Anterior Thigh Region  No depletion  Posterior Calf Region  No depletion  Edema (RD Assessment)  Mild  Hair  Reviewed  Eyes  Reviewed  Mouth  Reviewed  Skin  Reviewed  Nails  Reviewed       Diet Order:   Diet Order           Diet NPO time specified  Diet effective midnight        Diet NPO time specified Except for: Sips with Meds  Diet effective midnight        Diet regular Room service appropriate? Yes; Fluid consistency: Thin  Diet effective now          EDUCATION NEEDS:   No education needs have been identified at this time  Skin:  Skin Assessment: Skin Integrity Issues: Skin Integrity Issues:: Diabetic Ulcer Diabetic Ulcer: full thickness rt lateral foot ulcer with necrotic tissue on 4th and 5th digits  Last BM:  06/06/18  Height:   Ht Readings from Last 1 Encounters:  06/08/18 5\' 5"  (1.651 m)    Weight:   Wt Readings from Last 1 Encounters:  06/08/18 194 lb 0.1 oz (88 kg)  Ideal Body Weight:  56.8 kg  BMI:  Body mass index is 32.28 kg/m.  Estimated Nutritional Needs:   Kcal:  1700-1900  Protein:  110-125 grams  Fluid:  per MD    Charmain Diosdado A. Jimmye Norman, RD, LDN, CDE Pager: [phone] After hours Pager: [phone]

## 2018-06-09 LAB — BASIC METABOLIC PANEL
Anion gap: 15 (ref 5–15)
BUN: 47 mg/dL — AB (ref 6–20)
CHLORIDE: 98 mmol/L (ref 98–111)
CO2: 25 mmol/L (ref 22–32)
CREATININE: 7.91 mg/dL — AB (ref 0.44–1.00)
Calcium: 8.9 mg/dL (ref 8.9–10.3)
GFR calc Af Amer: 7 mL/min — ABNORMAL LOW (ref >60)
GFR calc non Af Amer: 6 mL/min — ABNORMAL LOW (ref >60)
GLUCOSE: 116 mg/dL — AB (ref 70–99)
Potassium: 4.4 mmol/L (ref 3.5–5.1)
Sodium: 138 mmol/L (ref 135–145)

## 2018-06-09 LAB — CBC
HEMATOCRIT: 30.8 % — AB (ref 36.0–46.0)
HEMOGLOBIN: 9.5 g/dL — AB (ref 12.0–15.0)
MCH: 28.8 pg (ref 26.0–34.0)
MCHC: 30.8 g/dL (ref 30.0–36.0)
MCV: 93.3 fL (ref 78.0–100.0)
Platelets: 247 10*3/uL (ref 150–400)
RBC: 3.3 MIL/uL — ABNORMAL LOW (ref 3.87–5.11)
RDW: 14.5 % (ref 11.5–15.5)
WBC: 10.1 10*3/uL (ref 4.0–10.5)

## 2018-06-09 LAB — GLUCOSE, CAPILLARY
GLUCOSE-CAPILLARY: 98 mg/dL (ref 70–99)
Glucose-Capillary: 164 mg/dL — ABNORMAL HIGH (ref 70–99)
Glucose-Capillary: 206 mg/dL — ABNORMAL HIGH (ref 70–99)
Glucose-Capillary: 222 mg/dL — ABNORMAL HIGH (ref 70–99)
Glucose-Capillary: 292 mg/dL — ABNORMAL HIGH (ref 70–99)

## 2018-06-09 MED ORDER — CHLORHEXIDINE GLUCONATE CLOTH 2 % EX PADS
6.0000 | MEDICATED_PAD | Freq: Every day | CUTANEOUS | Status: DC
  Administered 2018-06-10 – 2018-06-11 (×2): 6 via TOPICAL

## 2018-06-09 MED ORDER — LOPERAMIDE HCL 2 MG PO CAPS
2.0000 mg | ORAL_CAPSULE | ORAL | Status: DC | PRN
  Administered 2018-06-10 (×3): 2 mg via ORAL
  Filled 2018-06-09 (×3): qty 1

## 2018-06-09 MED ORDER — VANCOMYCIN HCL IN DEXTROSE 1-5 GM/200ML-% IV SOLN
1000.0000 mg | Freq: Once | INTRAVENOUS | Status: AC
  Filled 2018-06-09: qty 200

## 2018-06-09 MED ORDER — CHLORHEXIDINE GLUCONATE CLOTH 2 % EX PADS: 6.0000 | MEDICATED_PAD | Freq: Every day | CUTANEOUS | Status: DC

## 2018-06-09 MED ORDER — MUPIROCIN 2 % EX OINT
1.0000 "application " | TOPICAL_OINTMENT | Freq: Two times a day (BID) | CUTANEOUS | Status: DC
  Administered 2018-06-09 – 2018-06-12 (×6): 1 via NASAL
  Filled 2018-06-09 (×2): qty 22

## 2018-06-09 NOTE — Progress Notes (Signed)
[facility] Progress Note  Subjective: no c/o  Vitals:   06/09/18 0632 06/09/18 0635 06/09/18 1237 06/09/18 1238  BP: (!) 147/76  134/62 134/62  Pulse:   80   Resp: 14  13   Temp: 98.1 F (36.7 C)  98.5 F (36.9 C)   TempSrc: Oral  Oral   SpO2: 98%  98%   Weight:  81.1 kg (178 lb 12.7 oz)    Height:        Inpatient medications: . amLODipine  10 mg Oral Daily  . calcium acetate  1,334 mg Oral TID WC  . Chlorhexidine Gluconate Cloth  6 each Topical Q0600  . collagenase   Topical Daily  . [START ON 06/12/2018] darbepoetin (ARANESP) injection - DIALYSIS  40 mcg Intravenous Q Fri-HD  . doxercalciferol  5 mcg Intravenous Q M,W,F-HD  . famotidine  20 mg Oral Daily  . gabapentin  100 mg Oral TID  . hydrALAZINE  100 mg Oral Q8H  . insulin aspart  0-5 Units Subcutaneous QHS  . insulin aspart  0-9 Units Subcutaneous TID WC  . insulin glargine  10 Units Subcutaneous QHS  . labetalol  200 mg Oral BID  . losartan  100 mg Oral Daily  . multivitamin  1 tablet Oral QHS  . nutrition supplement (JUVEN)  1 packet Oral BID BM  . sodium chloride flush  3 mL Intravenous Q12H  . sodium chloride flush  3 mL Intravenous Q12H   . sodium chloride    . sodium chloride    . sodium chloride    . sodium chloride    . cefTRIAXone (ROCEPHIN)  IV Stopped (06/09/18 0200)  . metronidazole 500 mg (06/09/18 0934)  . vancomycin 1,000 mg (06/09/18 1107)  . vancomycin     sodium chloride, sodium chloride, sodium chloride, sodium chloride, acetaminophen, alteplase, hydrALAZINE, HYDROcodone-acetaminophen, labetalol, lidocaine (PF), lidocaine-prilocaine, ondansetron (ZOFRAN) IV, ondansetron **OR** [DISCONTINUED] ondansetron (ZOFRAN) IV, pentafluoroprop-tetrafluoroeth, polyvinyl alcohol, senna-docusate, sodium chloride flush, sodium chloride flush  Exam: Alert, no distress  no jvd  Chest cta bilat  Cor reg no mrg  Abd soft ntnd no ascites obese  ext R foot wrapped, no LE edema  NF, ox 3  LUA AVF+bruit  Dialysis: [facility] MWF  3h 47min   400/600  82kg  2/2.5 bath LUA AVF  Hep 2300 (pt refusing of late due to vitreous hemorrhage) - venofer 50 /wk - hect 5 - aranesp 40 last 7/5 - recent Hb 10.3 tsat 13%  ferr 528  pth 201  P 6      Impression: 1 R foot ulcer/ infection: on IV abx, for toe amputation postponed to wed or thursday 2 ESRD on HD MWF. Cont HD MWF, schedule around any surgeries.  3  Volume - down to dry wt 4  DM severe complications 5  Anemia ckd - hb 9.6, gets darbe 40 outpt last 06/05/18, no need esa here 6  Hx MRSA endocarditis 7  HTN - severe, controlled, on 4 bp meds high dose 8  Chronic back pain - disabled from this and other issues, mostly nonamb per notes  Plan - HD Wednesday   Rob Cloud MD [place] pager [phone]   06/09/2018, 1:24 PM   Recent Labs  Lab 06/06/18 2156  06/07/18 2206 06/09/18 0842  NA 137   < > 132* 138  K 4.8   < > 5.5* 4.4  CL 100   < > 96* 98  CO2 26   < >  24 25  GLUCOSE 173*   < > 243* 116*  BUN 37*   < > 52* 47*  CREATININE 6.98*   < > 8.69* 7.91*  CALCIUM 9.4   < > 9.1 8.9  PHOS  --   --  5.4*  --   ALBUMIN 3.2*  --  2.8*  --    < > = values in this interval not displayed.   Recent Labs  Lab 06/06/18 2156  AST 17  ALT 15  ALKPHOS 157*  BILITOT 0.8  PROT 6.7   Recent Labs  Lab 06/06/18 2156 06/07/18 0227 06/07/18 2206 06/09/18 0842  WBC 15.4* 13.5* 11.0* 10.1  NEUTROABS 11.7* 9.5*  --   --   HGB 10.6* 9.7* 9.6* 9.5*  HCT 34.9* 30.8* 30.6* 30.8*  MCV 95.9 93.6 93.9 93.3  PLT 269 240 245 247   Iron/TIBC/Ferritin/ %Sat No results found for: IRON, TIBC, FERRITIN, IRONPCTSAT

## 2018-06-09 NOTE — Progress Notes (Signed)
   VASCULAR SURGERY ASSESSMENT & PLAN:   Her arteriogram yesterday shows that she has in-line flow down through the anterior tibial artery onto the foot.  The posterior tibial artery is significantly diseased.  However, I think she has adequate circulation to heal toe amputation.  I have recommended ray amputation of the right fifth toe given the extent of the wound and the osteomyelitis of the fifth toe.  I have also explained that we may have to take the adjacent fourth toe if the fourth meta tarsal is also involved.  We had her on the schedule today but her surgery was canceled because of issues with the operating room.  I will try to schedule her for tomorrow or Thursday.  She dialyzes on Monday Wednesdays and Fridays.  ID: She continues her on broad-spectrum antibiotics including IV Rocephin, IV Flagyl, and IV vancomycin.  Her blood cultures are negative so far.  Her nasal swab had MRSA.   SUBJECTIVE:   No specific complaints.  PHYSICAL EXAM:   Vitals:   06/09/18 0004 06/09/18 0405 06/09/18 0632 06/09/18 0635  BP: (!) 166/79 140/68 (!) 147/76   Pulse: 98 85    Resp:  16 14   Temp: 99.9 F (37.7 C) 98.5 F (36.9 C) 98.1 F (36.7 C)   TempSrc: Oral Oral Oral   SpO2: 99% 97% 98%   Weight:    178 lb 12.7 oz (81.1 kg)  Height:       Her left groin looks fine. The wound on the lateral aspect of the right foot is unchanged.  There is mild erythema with no drainage.  LABS:   Lab Results  Component Value Date   WBC 11.0 (H) 06/07/2018   HGB 9.6 (L) 06/07/2018   HCT 30.6 (L) 06/07/2018   MCV 93.9 06/07/2018   PLT 245 06/07/2018   CBG (last 3)  Recent Labs    06/08/18 1630 06/09/18 0016 06/09/18 0641  GLUCAP 109* 222* 164*    PROBLEM LIST:    Principal Problem:   Diabetic foot infection ([place]) Active Problems:   ESRD on hemodialysis ([facility])   IDDM (insulin dependent diabetes mellitus) ([place])   CURRENT MEDS:   . amLODipine  10 mg Oral Daily  . calcium acetate  1,334  mg Oral TID WC  . Chlorhexidine Gluconate Cloth  6 each Topical Q0600  . collagenase   Topical Daily  . [START ON 06/12/2018] darbepoetin (ARANESP) injection - DIALYSIS  40 mcg Intravenous Q Fri-HD  . doxercalciferol  5 mcg Intravenous Q M,W,F-HD  . famotidine  20 mg Oral Daily  . gabapentin  100 mg Oral TID  . hydrALAZINE  100 mg Oral Q8H  . insulin aspart  0-5 Units Subcutaneous QHS  . insulin aspart  0-9 Units Subcutaneous TID WC  . insulin glargine  10 Units Subcutaneous QHS  . labetalol  200 mg Oral BID  . losartan  100 mg Oral Daily  . multivitamin  1 tablet Oral QHS  . nutrition supplement (JUVEN)  1 packet Oral BID BM  . sodium chloride flush  3 mL Intravenous Q12H  . sodium chloride flush  3 mL Intravenous Q12H    Deitra Mayo Beeper: [phone] Office: [phone] 06/09/2018

## 2018-06-09 NOTE — Progress Notes (Signed)
PROGRESS NOTE    Karen [name]  ERX:[id number] DOB: 07/26/1976 DOA: 06/06/2018 PCP: Wynn Banker, PA    Brief Narrative: Karen [name] is a 42 y.o. female with medical history significant for insulin-dependent diabetes mellitus, ESRD on HD, hypertension, and diabetic foot ulcer, now presenting to the emergency department for evaluation of subjective fevers and increasing redness, swelling, and drainage from her right foot ulcer despite recent debridement and antibiotics.  Patient saw podiatry just over a week ago and had a ulcer at the lateral plantar aspect of her right forefoot.  This was debrided and she was treated with ciprofloxacin and clindamycin which she has just completed.  Despite this, the patient reports subjective fevers and chills, increasing redness and swelling, foul odor, and purulent drainage.  She reports completing hemodialysis on 06/05/2018 without incident.  She had ABIs performed last month that were considered unreliable due to arterial wall calcification.   Upon arrival to the ED, patient is found to be afebrile, saturating well on room air, and with vitals otherwise stable.  Chemistry panel features a normal potassium, normal bicarbonate, BUN of 37, and glucose 173.  CBC features a leukocytosis to 15,400 and a stable normocytic anemia with hemoglobin of 10.6.  Lactic acid is reassuringly normal.  Patient was treated with vancomycin and Zosyn in the ED.  Admitted with Diabetic foot infection, fail oral antibiotics. Evaluated by vascular, underwent angiogram;  ; per vascular patient has adequate circulation to be able to heal toe amputation. She was diagnose with Osteomyelitis of right fifth toe.     Assessment & Plan:   Principal Problem:   Diabetic foot infection ([facility]) Active Problems:   ESRD on hemodialysis ([facility])   IDDM (insulin dependent diabetes mellitus) ([facility])   1-Diabetic Foot infection; cellulitis Right lower extremities cellulitis,  Osteomyelitis right Fifth toe;  Had ABI last month, some calcification/  Vascular consulted.  Podiatric consulted.  Continue with IV vancomycin, ceftriaxone and flagyl.  Blood culture no growth to date.  ABI different result compare to last month ABI.  MRI negative for abscess. Osteomyelitis right Fifth Toe.  CT angio LE; pending results. Vascular review CT and recommended arteriogram.  Arteriogram ; per vascular patient has adequate circulation to be able to heal toe amputation.  Surgery change for tomorrow or Thursday by vascular.  WBC normalized.   2-HTN; continue with Norvasc, hydralazine, cozaar, labetalol. Marland Kitchen   3-ESRD; on HD;  Nephrology consulted.  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     DVT prophylaxis: refuse heparin. SCD ordered.  Code Status: full code.  Family Communication: care discussed with patient.  Disposition Plan: to be determine.    Consultants:   Vascular.    Procedures:  HD  ABI   Antimicrobials:  Vancomycin, ceftriaxone, flagyl.    Subjective: No new complaints. She was told her surgery was cancel today.    Objective: Vitals:   06/09/18 0632 06/09/18 0635 06/09/18 1237 06/09/18 1238  BP: (!) 147/76  134/62 134/62  Pulse:   80   Resp: 14  13   Temp: 98.1 F (36.7 C)  98.5 F (36.9 C)   TempSrc: Oral  Oral   SpO2: 98%  98%   Weight:  81.1 kg (178 lb 12.7 oz)    Height:        Intake/Output Summary (Last 24 hours) at 06/09/2018 1451 Last data filed at 06/08/2018 2236 Gross per 24 hour  Intake -  Output 3998 ml  Net -3998 ml   Filed Weights   06/08/18 1900 06/08/18 2320 06/09/18 0635  Weight: 85.7 kg (188 lb 15 oz) 81.5 kg (179 lb 10.8 oz) 81.1 kg (178 lb 12.7 oz)    Examination:  General exam: NAD Respiratory system: CTA Cardiovascular system: S 1, S 2 RRR Gastrointestinal system: BS present, soft, nt Central nervous system: Alert and oriented. Blind left eye.  Extremities:  Right Lower extremity with dressing.    Data Reviewed: I have personally reviewed following labs and imaging studies  CBC: Recent Labs  Lab 06/06/18 2156 06/07/18 0227 06/07/18 2206 06/09/18 0842  WBC 15.4* 13.5* 11.0* 10.1  NEUTROABS 11.7* 9.5*  --   --   HGB 10.6* 9.7* 9.6* 9.5*  HCT 34.9* 30.8* 30.6* 30.8*  MCV 95.9 93.6 93.9 93.3  PLT 269 240 245 182   Basic Metabolic Panel: Recent Labs  Lab 06/06/18 2156 06/07/18 0227 06/07/18 2206 06/09/18 0842  NA 137 136 132* 138  K 4.8 4.8 5.5* 4.4  CL 100 98 96* 98  CO2 26 27 24 25   GLUCOSE 173* 211* 243* 116*  BUN 37* 39* 52* 47*  CREATININE 6.98* 7.26* 8.69* 7.91*  CALCIUM 9.4 9.0 9.1 8.9  PHOS  --   --  5.4*  --    GFR: Estimated Creatinine Clearance: 9.8 mL/min (A) (by C-G formula based on SCr of 7.91 mg/dL (H)). Liver Function Tests: Recent Labs  Lab 06/06/18 2156 06/07/18 2206  AST 17  --   ALT 15  --   ALKPHOS 157*  --   BILITOT 0.8  --   PROT 6.7  --   ALBUMIN 3.2* 2.8*   No results for input(s): LIPASE, AMYLASE in the last 168 hours. No results for input(s): AMMONIA in the last 168 hours. Coagulation Profile: No results for input(s): INR, PROTIME in the last 168 hours. Cardiac Enzymes: No results for input(s): CKTOTAL, CKMB, CKMBINDEX, TROPONINI in the last 168 hours. BNP (last 3 results) No results for input(s): PROBNP in the last 8760 hours. HbA1C: No results for input(s): HGBA1C in the last 72 hours. CBG: Recent Labs  Lab 06/08/18 1106 06/08/18 1630 06/09/18 0016 06/09/18 0641 06/09/18 1240  GLUCAP 131* 109* 222* 164* 292*   Lipid Profile: No results for input(s): CHOL, HDL, LDLCALC, TRIG, CHOLHDL, LDLDIRECT in the last 72 hours. Thyroid Function Tests: No results for input(s): TSH, T4TOTAL, FREET4, T3FREE, THYROIDAB in the last 72 hours. Anemia Panel: No results for input(s): VITAMINB12, FOLATE, FERRITIN, TIBC, IRON, RETICCTPCT in the last 72 hours. Sepsis Labs: Recent Labs  Lab  06/06/18 2224  LATICACIDVEN 1.62    Recent Results (from the past 240 hour(s))  Blood Cultures x 2 sites     Status: None (Preliminary result)   Collection Time: 06/07/18  2:20 AM  Result Value Ref Range Status   Specimen Description BLOOD RIGHT HAND  Final   Special Requests   Final    BOTTLES DRAWN AEROBIC AND ANAEROBIC Blood Culture adequate volume   Culture   Final    NO GROWTH 2 DAYS Performed at [facility], [address], [place], [place] [address]    Report Status PENDING  Incomplete  Blood Cultures x 2 sites     Status: None (Preliminary result)   Collection Time: 06/07/18  2:30 AM  Result Value Ref Range Status   Specimen Description BLOOD RIGHT HAND  Final   Special Requests   Final    BOTTLES  DRAWN AEROBIC AND ANAEROBIC Blood Culture adequate volume   Culture   Final    NO GROWTH 2 DAYS Performed at [facility], [place] [address], [place], [place] [address]    Report Status PENDING  Incomplete  Surgical pcr screen     Status: Abnormal   Collection Time: 06/08/18  6:25 AM  Result Value Ref Range Status   MRSA, PCR POSITIVE (A) NEGATIVE Final    Comment: RESULT CALLED TO, READ BACK BY AND VERIFIED WITH: B. Sloop RN 9:40 06/08/18 ([email or web address])    Staphylococcus aureus POSITIVE (A) NEGATIVE Final    Comment: (NOTE) The Xpert SA Assay (FDA approved for NASAL specimens in patients 58 years of age and older), is one component of a comprehensive surveillance program. It is not intended to diagnose infection nor to guide or monitor treatment. Performed at [facility], [place] [address] [place], [place] [address]          Radiology Studies: No results found.      Scheduled Meds: . amLODipine  10 mg Oral Daily  . calcium acetate  1,334 mg Oral TID WC  . [START ON 06/10/2018] Chlorhexidine Gluconate Cloth  6 each Topical Q0600  . collagenase   Topical Daily  . [START ON 06/12/2018] darbepoetin (ARANESP) injection - DIALYSIS  40 mcg Intravenous Q  Fri-HD  . doxercalciferol  5 mcg Intravenous Q M,W,F-HD  . famotidine  20 mg Oral Daily  . gabapentin  100 mg Oral TID  . hydrALAZINE  100 mg Oral Q8H  . insulin aspart  0-5 Units Subcutaneous QHS  . insulin aspart  0-9 Units Subcutaneous TID WC  . insulin glargine  10 Units Subcutaneous QHS  . labetalol  200 mg Oral BID  . losartan  100 mg Oral Daily  . multivitamin  1 tablet Oral QHS  . nutrition supplement (JUVEN)  1 packet Oral BID BM  . sodium chloride flush  3 mL Intravenous Q12H  . sodium chloride flush  3 mL Intravenous Q12H   Continuous Infusions: . sodium chloride    . sodium chloride    . sodium chloride    . sodium chloride    . cefTRIAXone (ROCEPHIN)  IV Stopped (06/09/18 0200)  . metronidazole 500 mg (06/09/18 0934)  . vancomycin 1,000 mg (06/09/18 1107)     LOS: 2 days    Time spent: 35 minutes.     Elmarie Shiley, MD [facility] Pager [phone]  If 7PM-7AM, please contact night-coverage [email or web address] Password [email or web address] 06/09/2018, 2:51 PM

## 2018-06-10 DIAGNOSIS — L089 Local infection of the skin and subcutaneous tissue, unspecified: Secondary | ICD-10-CM

## 2018-06-10 DIAGNOSIS — E119 Type 2 diabetes mellitus without complications: Secondary | ICD-10-CM

## 2018-06-10 DIAGNOSIS — E11628 Type 2 diabetes mellitus with other skin complications: Secondary | ICD-10-CM

## 2018-06-10 DIAGNOSIS — Z794 Long term (current) use of insulin: Secondary | ICD-10-CM

## 2018-06-10 DIAGNOSIS — I10 Essential (primary) hypertension: Secondary | ICD-10-CM

## 2018-06-10 LAB — RENAL FUNCTION PANEL
ANION GAP: 15 (ref 5–15)
Albumin: 2.7 g/dL — ABNORMAL LOW (ref 3.5–5.0)
BUN: 64 mg/dL — ABNORMAL HIGH (ref 6–20)
CALCIUM: 9 mg/dL (ref 8.9–10.3)
CO2: 24 mmol/L (ref 22–32)
Chloride: 96 mmol/L — ABNORMAL LOW (ref 98–111)
Creatinine, Ser: 9.06 mg/dL — ABNORMAL HIGH (ref 0.44–1.00)
GFR, EST AFRICAN AMERICAN: 6 mL/min — AB (ref >60)
GFR, EST NON AFRICAN AMERICAN: 5 mL/min — AB (ref >60)
Glucose, Bld: 205 mg/dL — ABNORMAL HIGH (ref 70–99)
PHOSPHORUS: 5.5 mg/dL — AB (ref 2.5–4.6)
Potassium: 4.7 mmol/L (ref 3.5–5.1)
SODIUM: 135 mmol/L (ref 135–145)

## 2018-06-10 LAB — GLUCOSE, CAPILLARY
Glucose-Capillary: 121 mg/dL — ABNORMAL HIGH (ref 70–99)
Glucose-Capillary: 200 mg/dL — ABNORMAL HIGH (ref 70–99)
Glucose-Capillary: 234 mg/dL — ABNORMAL HIGH (ref 70–99)

## 2018-06-10 MED ORDER — VANCOMYCIN HCL IN DEXTROSE 1-5 GM/200ML-% IV SOLN
INTRAVENOUS | Status: AC
  Filled 2018-06-10: qty 200

## 2018-06-10 MED ORDER — SACCHAROMYCES BOULARDII 250 MG PO CAPS
250.0000 mg | ORAL_CAPSULE | Freq: Two times a day (BID) | ORAL | Status: DC
  Administered 2018-06-10 – 2018-06-12 (×5): 250 mg via ORAL
  Filled 2018-06-10 (×4): qty 1

## 2018-06-10 MED ORDER — DOXERCALCIFEROL 4 MCG/2ML IV SOLN
INTRAVENOUS | Status: AC
  Filled 2018-06-10: qty 4

## 2018-06-10 NOTE — Progress Notes (Addendum)
[facility] PROGRESS NOTE  Karen [name] CXK:[id number] DOB: Dec 16, 1975 DOA: 06/06/2018  PCP: Wynn Banker, PA  Brief History/Interval Summary: 41 year old female with a past medical history of insulin-dependent diabetes mellitus since the age of 56, end-stage renal disease on hemodialysis for the past 1 year, Hypertension, diabetic foot ulcer presented with fever and redness and swelling around her wound.  She was recently seen by podiatry as an outpatient and was placed on ciprofloxacin and clindamycin.  She completed the course of these antibiotics.  She was admitted for further management.  She was seen by vascular surgery and underwent angiogram.  She will need amputation of the left fifth toe.  Reason for Visit: Osteomyelitis of the left fifth toe  Consultants: Vascular surgery.  Nephrology.  Procedures:  Hemodialysis.    Lower extremity angiogram The aorta and iliac segments are small but there is no flow-limiting stenosis.  On the left leg she has run off which is very quick at least via the anterior tibial artery.  On the right side she possibly has a non-flow-limiting above the knee popliteal artery stenosis.  The anterior tibial runoff to the foot gives off the DP.  Posterior tibial artery is initially stenotic at approximately 70% but terminates at the ankle.  No intervention was undertaken.  Antibiotics: On vancomycin, ceftriaxone and Flagyl  Subjective/Interval History: She denies any pain in the foot.  Overall she feels well.  Wondering when she is going to have her surgery.  Patient seen at hemodialysis.  ROS: Denies any shortness of breath.  No nausea or vomiting  Objective:  Vital Signs  Vitals:   06/10/18 0930 06/10/18 1000 06/10/18 1030 06/10/18 1100  BP: (!) 158/79 (!) 144/76 (!) 150/80 (!) 154/61  Pulse: 88 89 88 88  Resp: 18 18 18 16   Temp:    98.3 F (36.8 C)  TempSrc:    Oral  SpO2:    99%  Weight:    82.3 kg (181 lb 7 oz)    Height:        Intake/Output Summary (Last 24 hours) at 06/10/2018 1127 Last data filed at 06/10/2018 1100 Gross per 24 hour  Intake 868.61 ml  Output 1000 ml  Net -131.39 ml   Filed Weights   06/10/18 0355 06/10/18 0700 06/10/18 1100  Weight: 82.3 kg (181 lb 7 oz) 82.3 kg (181 lb 7 oz) 82.3 kg (181 lb 7 oz)    General appearance: alert, cooperative, appears stated age and no distress Head: Normocephalic, without obvious abnormality, atraumatic Resp: clear to auscultation bilaterally Cardio: regular rate and rhythm, S1, S2 normal, no murmur, click, rub or gallop GI: soft, non-tender; bowel sounds normal; no masses,  no organomegaly Extremities: Right foot covered in dressing Neurologic: No obvious focal neurological deficits.  Lab Results:  Data Reviewed: I have personally reviewed following labs and imaging studies  CBC: Recent Labs  Lab 06/06/18 2156 06/07/18 0227 06/07/18 2206 06/09/18 0842  WBC 15.4* 13.5* 11.0* 10.1  NEUTROABS 11.7* 9.5*  --   --   HGB 10.6* 9.7* 9.6* 9.5*  HCT 34.9* 30.8* 30.6* 30.8*  MCV 95.9 93.6 93.9 93.3  PLT 269 240 245 970    Basic Metabolic Panel: Recent Labs  Lab 06/06/18 2156 06/07/18 0227 06/07/18 2206 06/09/18 0842 06/10/18 0338  NA 137 136 132* 138 135  K 4.8 4.8 5.5* 4.4 4.7  CL 100 98 96* 98 96*  CO2 26 27 24 25 24   GLUCOSE 173* 211* 243* 116* 205*  BUN 37* 39* 52* 47* 64*  CREATININE 6.98* 7.26* 8.69* 7.91* 9.06*  CALCIUM 9.4 9.0 9.1 8.9 9.0  PHOS  --   --  5.4*  --  5.5*    GFR: Estimated Creatinine Clearance: 8.7 mL/min (A) (by C-G formula based on SCr of 9.06 mg/dL (H)).  Liver Function Tests: Recent Labs  Lab 06/06/18 2156 06/07/18 2206 06/10/18 0338  AST 17  --   --   ALT 15  --   --   ALKPHOS 157*  --   --   BILITOT 0.8  --   --   PROT 6.7  --   --   ALBUMIN 3.2* 2.8* 2.7*     CBG: Recent Labs  Lab 06/09/18 0016 06/09/18 0641 06/09/18 1240 06/09/18 1624 06/09/18 2130  GLUCAP 222* 164*  292* 206* 98     Recent Results (from the past 240 hour(s))  Blood Cultures x 2 sites     Status: None (Preliminary result)   Collection Time: 06/07/18  2:20 AM  Result Value Ref Range Status   Specimen Description BLOOD RIGHT HAND  Final   Special Requests   Final    BOTTLES DRAWN AEROBIC AND ANAEROBIC Blood Culture adequate volume   Culture   Final    NO GROWTH 2 DAYS Performed at [facility], [place] [address], [place], [place] [address]    Report Status PENDING  Incomplete  Blood Cultures x 2 sites     Status: None (Preliminary result)   Collection Time: 06/07/18  2:30 AM  Result Value Ref Range Status   Specimen Description BLOOD RIGHT HAND  Final   Special Requests   Final    BOTTLES DRAWN AEROBIC AND ANAEROBIC Blood Culture adequate volume   Culture   Final    NO GROWTH 2 DAYS Performed at [facility], [place] [address] [place], [place] [address]    Report Status PENDING  Incomplete  Surgical pcr screen     Status: Abnormal   Collection Time: 06/08/18  6:25 AM  Result Value Ref Range Status   MRSA, PCR POSITIVE (A) NEGATIVE Final    Comment: RESULT CALLED TO, READ BACK BY AND VERIFIED WITH: B. Sloop RN 9:40 06/08/18 ([email or web address])    Staphylococcus aureus POSITIVE (A) NEGATIVE Final    Comment: (NOTE) The Xpert SA Assay (FDA approved for NASAL specimens in patients 68 years of age and older), is one component of a comprehensive surveillance program. It is not intended to diagnose infection nor to guide or monitor treatment. Performed at [facility], [place] [address] [place], [place] [address]       Radiology Studies: No results found.   Medications:  Scheduled: . amLODipine  10 mg Oral Daily  . calcium acetate  1,334 mg Oral TID WC  . Chlorhexidine Gluconate Cloth  6 each Topical Q0600  . collagenase   Topical Daily  . [START ON 06/12/2018] darbepoetin (ARANESP) injection - DIALYSIS  40 mcg Intravenous Q Fri-HD  . doxercalciferol      .  doxercalciferol  5 mcg Intravenous Q M,W,F-HD  . famotidine  20 mg Oral Daily  . gabapentin  100 mg Oral TID  . hydrALAZINE  100 mg Oral Q8H  . insulin aspart  0-5 Units Subcutaneous QHS  . insulin aspart  0-9 Units Subcutaneous TID WC  . insulin glargine  10 Units Subcutaneous QHS  . labetalol  200 mg Oral BID  . losartan  100 mg  Oral Daily  . multivitamin  1 tablet Oral QHS  . mupirocin ointment  1 application Nasal BID  . nutrition supplement (JUVEN)  1 packet Oral BID BM  . saccharomyces boulardii  250 mg Oral BID  . sodium chloride flush  3 mL Intravenous Q12H  . sodium chloride flush  3 mL Intravenous Q12H   Continuous: . sodium chloride    . sodium chloride    . sodium chloride    . sodium chloride    . cefTRIAXone (ROCEPHIN)  IV Stopped (06/10/18 0100)  . metronidazole 100 mL/hr at 06/10/18 0200  . vancomycin 1,000 mg (06/10/18 0950)   YQM:GNOIBB chloride, sodium chloride, sodium chloride, sodium chloride, acetaminophen, alteplase, hydrALAZINE, HYDROcodone-acetaminophen, labetalol, lidocaine (PF), lidocaine-prilocaine, loperamide, ondansetron (ZOFRAN) IV, ondansetron **OR** [DISCONTINUED] ondansetron (ZOFRAN) IV, pentafluoroprop-tetrafluoroeth, polyvinyl alcohol, senna-docusate, sodium chloride flush, sodium chloride flush  Assessment/Plan:    Diabetic foot infection with osteomyelitis of the right fifth toe Patient seen by vascular surgery.  Underwent angiogram which does suggest adequate circulation.  No intervention was performed.  Plan is for amputation of the fifth toe.  Timing to be decided by vascular surgery.  Continue with intravenous vancomycin and ceftriaxone and Flagyl.  Patient mentions loose stools.  Possibly due to antibiotics.  Denies any abdominal pain.  Abdomen is benign.  Imodium.  Probiotics.  Essential hypertension Continue with home medications.  End-stage renal disease on hemodialysis on Monday.  Nephrology is following.  Patient dialyzed  today.  Insulin-dependent diabetes mellitus Monitor CBGs.  Continue Lantus.  DVT Prophylaxis: SCDs.  Patient apparently refused heparin products.    Code Status: Full code Family Communication: Discussed with the patient Disposition Plan: Management as outlined above.    LOS: 3 days   [facility] Pager [phone] 06/10/2018, 11:27 AM  If 7PM-7AM, please contact night-coverage at [email or web address], password [facility]

## 2018-06-10 NOTE — Progress Notes (Signed)
   VASCULAR SURGERY ASSESSMENT & PLAN:   For Karen [name] amputation of right 5th toe and possibly 4th toe tomorrow.  Should be having dialysis today.  Continue IV antibiotics.   SUBJECTIVE:   No complaints. Someone told her that she was having surgery today, but this is not correct.   PHYSICAL EXAM:   Vitals:   06/09/18 2000 06/09/18 2100 06/10/18 0355 06/10/18 0611  BP:  (!) 160/85 136/65 (!) 149/77  Pulse:      Resp: 16 14 14    Temp:  98.6 F (37 C) 98.1 F (36.7 C)   TempSrc:  Oral Oral   SpO2: 99%  96%   Weight:   181 lb 7 oz (82.3 kg)   Height:       Dressing with minimal drainage.   LABS:   Lab Results  Component Value Date   WBC 10.1 06/09/2018   HGB 9.5 (L) 06/09/2018   HCT 30.8 (L) 06/09/2018   MCV 93.3 06/09/2018   PLT 247 06/09/2018   Lab Results  Component Value Date   CREATININE 9.06 (H) 06/10/2018   CBG (last 3)  Recent Labs    06/09/18 1240 06/09/18 1624 06/09/18 2130  GLUCAP 292* 206* 98    PROBLEM LIST:    Principal Problem:   Diabetic foot infection ([place] [facility]) Active Problems:   ESRD on hemodialysis ([facility])   IDDM (insulin dependent diabetes mellitus) ([place])   CURRENT MEDS:   . amLODipine  10 mg Oral Daily  . calcium acetate  1,334 mg Oral TID WC  . Chlorhexidine Gluconate Cloth  6 each Topical Q0600  . collagenase   Topical Daily  . [START ON 06/12/2018] darbepoetin (ARANESP) injection - DIALYSIS  40 mcg Intravenous Q Fri-HD  . doxercalciferol  5 mcg Intravenous Q M,W,F-HD  . famotidine  20 mg Oral Daily  . gabapentin  100 mg Oral TID  . hydrALAZINE  100 mg Oral Q8H  . insulin aspart  0-5 Units Subcutaneous QHS  . insulin aspart  0-9 Units Subcutaneous TID WC  . insulin glargine  10 Units Subcutaneous QHS  . labetalol  200 mg Oral BID  . losartan  100 mg Oral Daily  . multivitamin  1 tablet Oral QHS  . mupirocin ointment  1 application Nasal BID  . nutrition supplement (JUVEN)  1 packet Oral BID BM  . sodium chloride flush  3 mL  Intravenous Q12H  . sodium chloride flush  3 mL Intravenous Q12H    Karen [name] Beeper: [phone] Office: [phone] 06/10/2018

## 2018-06-10 NOTE — Progress Notes (Signed)
Pharmacy Antibiotic Note  Karen [name] is a 42 y.o. female admitted on 06/06/2018 with diabetic foot wound.  Pharmacy has been consulted for Vancomycin dosing. Failed PO anti-biotics. WBC WNL. ESRD on HD MWF.   Plan: Continue Vancomycin 1000 mg IV qHD MWF Continue Ceftriaxone/Flagyl per MD Trend WBC, temp, HD schedule  F/U infectious work-up Drug levels as indicated  Temp (24hrs), Avg:98.4 F (36.9 C), Min:98.1 F (36.7 C), Max:98.6 F (37 C)  Recent Labs  Lab 06/06/18 2156 06/06/18 2224 06/07/18 0227 06/07/18 2206 06/09/18 0842 06/10/18 0338  WBC 15.4*  --  13.5* 11.0* 10.1  --   CREATININE 6.98*  --  7.26* 8.69* 7.91* 9.06*  LATICACIDVEN  --  1.62  --   --   --   --     Estimated Creatinine Clearance: 8.7 mL/min (A) (by C-G formula based on SCr of 9.06 mg/dL (H)).    Allergies  Allergen Reactions   Heparin Other (See Comments)    Patient relates vitreous hemorrhage after heparin.    Juanell Fairly, PharmD PGY1 Pharmacy Resident Phone [phone] 06/10/2018 11:31 AM

## 2018-06-10 NOTE — Progress Notes (Addendum)
[facility] Progress Note   Subjective:  Seen on HD. Tolerating. No c/os this am  Objective Vitals:   06/10/18 0800 06/10/18 0830 06/10/18 0900 06/10/18 0930  BP: 137/64 (!) 144/65 (!) 162/82 (!) 158/79  Pulse: 88 88 89 88  Resp: 14 18 17    Temp:      TempSrc:      SpO2:      Weight:      Height:       Physical Exam General: WNWD female NAD  Heart: RRR Lungs: CTAB Abdomen: soft NT/ND Extremities: No LE edema  Dialysis Access: LUE AVF +bruit   Dialysis: AF MWF  3h 97min   400/600  82kg  2/2.5 bath LUA AVF  Hep 2300 (pt refusing due to vitreous hemorrhage) - venofer 50 /wk - hect 5 - aranesp 40 last 7/5 - recent Hb 10.3 tsat 13%  ferr 528  pth 201  P 6      Impression: 1 R foot ulcer/ infection: on IV abx, for toe amputation Thursday per Dr. Scot Dock  2 ESRD on HD MWF. Cont HD MWF, HD today on schedule 3  Volume - down to dry wt 4  DM severe complications 5  Anemia ckd - hb 9.6, gets darbe 40 outpt last 06/05/18, no need esa here 6  Hx MRSA endocarditis 7  HTN - severe, controlled, on 4 bp meds high dose 8  Chronic back pain - disabled from this and other issues, mostly nonamb per notes   Lynnda Child PA-C [facility] Pager [phone] 06/10/2018,10:00 AM  LOS: 3 days   Pt seen, examined and agree w A/P as above.  Kelly Splinter MD [facility] pager [phone]   06/10/2018, 11:36 AM    Additional Objective Labs: Basic Metabolic Panel: Recent Labs  Lab 06/07/18 2206 06/09/18 0842 06/10/18 0338  NA 132* 138 135  K 5.5* 4.4 4.7  CL 96* 98 96*  CO2 24 25 24   GLUCOSE 243* 116* 205*  BUN 52* 47* 64*  CREATININE 8.69* 7.91* 9.06*  CALCIUM 9.1 8.9 9.0  PHOS 5.4*  --  5.5*   CBC: Recent Labs  Lab 06/06/18 2156 06/07/18 0227 06/07/18 2206 06/09/18 0842  WBC 15.4* 13.5* 11.0* 10.1  NEUTROABS 11.7* 9.5*  --   --   HGB 10.6* 9.7* 9.6* 9.5*  HCT 34.9* 30.8* 30.6* 30.8*  MCV 95.9 93.6 93.9 93.3  PLT  269 240 245 247   Blood Culture    Component Value Date/Time   SDES BLOOD RIGHT HAND 06/07/2018 0230   SPECREQUEST  06/07/2018 0230    BOTTLES DRAWN AEROBIC AND ANAEROBIC Blood Culture adequate volume   CULT  06/07/2018 0230    NO GROWTH 2 DAYS Performed at [facility], [place] [address] [place], [place] [address]    REPTSTATUS PENDING 06/07/2018 0230    Cardiac Enzymes: No results for input(s): CKTOTAL, CKMB, CKMBINDEX, TROPONINI in the last 168 hours. CBG: Recent Labs  Lab 06/09/18 0016 06/09/18 0641 06/09/18 1240 06/09/18 1624 06/09/18 2130  GLUCAP 222* 164* 292* 206* 98   Iron Studies: No results for input(s): IRON, TIBC, TRANSFERRIN, FERRITIN in the last 72 hours. No results found for: INR, PROTIME Medications: . sodium chloride    . sodium chloride    . sodium chloride    . sodium chloride    . cefTRIAXone (ROCEPHIN)  IV Stopped (06/10/18 0100)  . metronidazole 100 mL/hr at 06/10/18 0200  . vancomycin 1,000 mg (  06/10/18 0950)   . amLODipine  10 mg Oral Daily  . calcium acetate  1,334 mg Oral TID WC  . Chlorhexidine Gluconate Cloth  6 each Topical Q0600  . collagenase   Topical Daily  . [START ON 06/12/2018] darbepoetin (ARANESP) injection - DIALYSIS  40 mcg Intravenous Q Fri-HD  . doxercalciferol      . doxercalciferol  5 mcg Intravenous Q M,W,F-HD  . famotidine  20 mg Oral Daily  . gabapentin  100 mg Oral TID  . hydrALAZINE  100 mg Oral Q8H  . insulin aspart  0-5 Units Subcutaneous QHS  . insulin aspart  0-9 Units Subcutaneous TID WC  . insulin glargine  10 Units Subcutaneous QHS  . labetalol  200 mg Oral BID  . losartan  100 mg Oral Daily  . multivitamin  1 tablet Oral QHS  . mupirocin ointment  1 application Nasal BID  . nutrition supplement (JUVEN)  1 packet Oral BID BM  . saccharomyces boulardii  250 mg Oral BID  . sodium chloride flush  3 mL Intravenous Q12H  . sodium chloride flush  3 mL Intravenous Q12H

## 2018-06-10 NOTE — Progress Notes (Signed)
Patient supposed to have surgery on 7/8 but surgery cancelled d/t issues in OR. Surgery rescheduled for 7/10 at 1330. Contacted hospitalist on call for NPO orders but none yet. Have maintained patient on NPO status since midnight. CHG bath also given this evening and another will be performed in the morning. Will continue to monitor. Lajoyce Corners, RN

## 2018-06-10 NOTE — Progress Notes (Signed)
Inpatient Diabetes Program Recommendations  AACE/ADA: New Consensus Statement on Inpatient Glycemic Control (2015)  Target Ranges:  Prepandial:   less than 140 mg/dL      Peak postprandial:   less than 180 mg/dL (1-2 hours)      Critically ill patients:  140 - 180 mg/dL   Results for Karen [name], Karen [name] (MRN [id number]) as of 06/10/2018 09:29  Ref. Range 06/09/2018 06:41 06/09/2018 12:40 06/09/2018 16:24 06/09/2018 21:30  Glucose-Capillary Latest Ref Range: 70 - 99 mg/dL 164 (H) 292 (H) 206 (H) 98   Review of Glycemic Control  Diabetes history: DM 2 Outpatient Diabetes medications: Lantus 10 units, Novolog 2-6 units tid with meals Current orders for Inpatient glycemic control: Lantus 10 units qhs, Novolog 0-9 units tid, Novolog 0-5 units qhs  Inpatient Diabetes Program Recommendations:    Glucose trends increases with meals. Patient takes meal coverage at home. Please consider Novolog 2 units tid meal coverage if patient consumes at least 50% of meals and glucose is at least 80 mg/dl.  Thanks,  Tama Headings RN, MSN, BC-ADM Inpatient Diabetes Coordinator Team Pager [phone] (8a-5p)

## 2018-06-10 NOTE — H&P (View-Only) (Signed)
   VASCULAR SURGERY ASSESSMENT & PLAN:   For Ray amputation of right 5th toe and possibly 4th toe tomorrow.  Should be having dialysis today.  Continue IV antibiotics.   SUBJECTIVE:   No complaints. Someone told her that she was having surgery today, but this is not correct.   PHYSICAL EXAM:   Vitals:   06/09/18 2000 06/09/18 2100 06/10/18 0355 06/10/18 0611  BP:  (!) 160/85 136/65 (!) 149/77  Pulse:      Resp: 16 14 14    Temp:  98.6 F (37 C) 98.1 F (36.7 C)   TempSrc:  Oral Oral   SpO2: 99%  96%   Weight:   181 lb 7 oz (82.3 kg)   Height:       Dressing with minimal drainage.   LABS:   Lab Results  Component Value Date   WBC 10.1 06/09/2018   HGB 9.5 (L) 06/09/2018   HCT 30.8 (L) 06/09/2018   MCV 93.3 06/09/2018   PLT 247 06/09/2018   Lab Results  Component Value Date   CREATININE 9.06 (H) 06/10/2018   CBG (last 3)  Recent Labs    06/09/18 1240 06/09/18 1624 06/09/18 2130  GLUCAP 292* 206* 98    PROBLEM LIST:    Principal Problem:   Diabetic foot infection ([place]) Active Problems:   ESRD on hemodialysis (HCC)   IDDM (insulin dependent diabetes mellitus) ([place])   CURRENT MEDS:   . amLODipine  10 mg Oral Daily  . calcium acetate  1,334 mg Oral TID WC  . Chlorhexidine Gluconate Cloth  6 each Topical Q0600  . collagenase   Topical Daily  . [START ON 06/12/2018] darbepoetin (ARANESP) injection - DIALYSIS  40 mcg Intravenous Q Fri-HD  . doxercalciferol  5 mcg Intravenous Q M,W,F-HD  . famotidine  20 mg Oral Daily  . gabapentin  100 mg Oral TID  . hydrALAZINE  100 mg Oral Q8H  . insulin aspart  0-5 Units Subcutaneous QHS  . insulin aspart  0-9 Units Subcutaneous TID WC  . insulin glargine  10 Units Subcutaneous QHS  . labetalol  200 mg Oral BID  . losartan  100 mg Oral Daily  . multivitamin  1 tablet Oral QHS  . mupirocin ointment  1 application Nasal BID  . nutrition supplement (JUVEN)  1 packet Oral BID BM  . sodium chloride flush  3 mL  Intravenous Q12H  . sodium chloride flush  3 mL Intravenous Q12H    Deitra Mayo Beeper: [phone] Office: [phone] 06/10/2018

## 2018-06-11 ENCOUNTER — Ambulatory Visit: Payer: Medicaid Other | Admitting: Podiatry

## 2018-06-11 ENCOUNTER — Inpatient Hospital Stay (HOSPITAL_COMMUNITY): Payer: Medicare Other | Admitting: Anesthesiology

## 2018-06-11 ENCOUNTER — Encounter (HOSPITAL_COMMUNITY)
Admission: EM | Disposition: A | Discharge status: Home Health | Payer: Self-pay | Source: Home / Self Care | Attending: Internal Medicine

## 2018-06-11 ENCOUNTER — Encounter (HOSPITAL_COMMUNITY): Payer: Self-pay | Admitting: Anesthesiology

## 2018-06-11 DIAGNOSIS — N186 End stage renal disease: Secondary | ICD-10-CM

## 2018-06-11 DIAGNOSIS — Z992 Dependence on renal dialysis: Secondary | ICD-10-CM

## 2018-06-11 DIAGNOSIS — M86571 Other chronic hematogenous osteomyelitis, right ankle and foot: Secondary | ICD-10-CM

## 2018-06-11 HISTORY — PX: AMPUTATION: SHX166

## 2018-06-11 LAB — GLUCOSE, CAPILLARY
GLUCOSE-CAPILLARY: 121 mg/dL — AB (ref 70–99)
GLUCOSE-CAPILLARY: 166 mg/dL — AB (ref 70–99)
Glucose-Capillary: 195 mg/dL — ABNORMAL HIGH (ref 70–99)
Glucose-Capillary: 117 mg/dL — ABNORMAL HIGH (ref 70–99)
Glucose-Capillary: 356 mg/dL — ABNORMAL HIGH (ref 70–99)
Glucose-Capillary: 330 mg/dL — ABNORMAL HIGH (ref 70–99)

## 2018-06-11 SURGERY — AMPUTATION DIGIT
Anesthesia: General | Site: Foot | Laterality: Right

## 2018-06-11 MED ORDER — LORAZEPAM 0.5 MG PO TABS: 0.5000 mg | ORAL_TABLET | ORAL | Status: DC

## 2018-06-11 MED ORDER — PROPOFOL 10 MG/ML IV BOLUS
INTRAVENOUS | Status: DC | PRN
  Administered 2018-06-11: 200 mg via INTRAVENOUS

## 2018-06-11 MED ORDER — INSULIN ASPART 100 UNIT/ML FLEXPEN: 2.0000 [IU] | PEN_INJECTOR | Freq: Three times a day (TID) | SUBCUTANEOUS | Status: DC | PRN

## 2018-06-11 MED ORDER — PHENYLEPHRINE 40 MCG/ML (10ML) SYRINGE FOR IV PUSH (FOR BLOOD PRESSURE SUPPORT)
PREFILLED_SYRINGE | INTRAVENOUS | Status: DC | PRN
  Administered 2018-06-11 (×5): 80 ug via INTRAVENOUS

## 2018-06-11 MED ORDER — BACITRACIN ZINC 500 UNIT/GM EX OINT: TOPICAL_OINTMENT | CUTANEOUS | Status: DC | PRN

## 2018-06-11 MED ORDER — INSULIN GLARGINE 100 UNIT/ML SOLOSTAR PEN: 10.0000 [IU] | PEN_INJECTOR | Freq: Every day | SUBCUTANEOUS | Status: DC

## 2018-06-11 MED ORDER — ONDANSETRON HCL 4 MG/2ML IJ SOLN
INTRAMUSCULAR | Status: DC | PRN
  Administered 2018-06-11: 4 mg via INTRAVENOUS

## 2018-06-11 MED ORDER — LIDOCAINE HCL 1 % IJ SOLN
INTRAMUSCULAR | Status: DC | PRN
  Administered 2018-06-11: 30 mL

## 2018-06-11 MED ORDER — LIDOCAINE HCL (PF) 1 % IJ SOLN
INTRAMUSCULAR | Status: AC
  Filled 2018-06-11: qty 30

## 2018-06-11 MED ORDER — PROPOFOL 10 MG/ML IV BOLUS
INTRAVENOUS | Status: AC
  Filled 2018-06-11: qty 20

## 2018-06-11 MED ORDER — DEXAMETHASONE SODIUM PHOSPHATE 10 MG/ML IJ SOLN
INTRAMUSCULAR | Status: DC | PRN
  Administered 2018-06-11: 4 mg via INTRAVENOUS

## 2018-06-11 MED ORDER — MEPERIDINE HCL 50 MG/ML IJ SOLN: 6.2500 mg | INTRAMUSCULAR | Status: DC | PRN

## 2018-06-11 MED ORDER — OXYCODONE-ACETAMINOPHEN 5-325 MG PO TABS: 1.0000 | ORAL_TABLET | ORAL | Status: DC | PRN

## 2018-06-11 MED ORDER — HYDROMORPHONE HCL 1 MG/ML IJ SOLN: 0.2500 mg | INTRAMUSCULAR | Status: DC | PRN

## 2018-06-11 MED ORDER — LIDOCAINE 2% (20 MG/ML) 5 ML SYRINGE
INTRAMUSCULAR | Status: AC
  Filled 2018-06-11: qty 5

## 2018-06-11 MED ORDER — DEXAMETHASONE SODIUM PHOSPHATE 10 MG/ML IJ SOLN
INTRAMUSCULAR | Status: AC
  Filled 2018-06-11: qty 1

## 2018-06-11 MED ORDER — MIDAZOLAM HCL 2 MG/2ML IJ SOLN
INTRAMUSCULAR | Status: AC
  Filled 2018-06-11: qty 2

## 2018-06-11 MED ORDER — PHENYLEPHRINE 40 MCG/ML (10ML) SYRINGE FOR IV PUSH (FOR BLOOD PRESSURE SUPPORT)
PREFILLED_SYRINGE | INTRAVENOUS | Status: AC
  Filled 2018-06-11: qty 10

## 2018-06-11 MED ORDER — DIPHENHYDRAMINE HCL 25 MG PO CAPS: 25.0000 mg | ORAL_CAPSULE | Freq: Every day | ORAL | Status: DC | PRN

## 2018-06-11 MED ORDER — SODIUM CHLORIDE 0.9 % IV SOLN
INTRAVENOUS | Status: DC | PRN
  Administered 2018-06-11: 07:00:00 via INTRAVENOUS

## 2018-06-11 MED ORDER — 0.9 % SODIUM CHLORIDE (POUR BTL) OPTIME
TOPICAL | Status: DC | PRN
  Administered 2018-06-11: 1000 mL

## 2018-06-11 MED ORDER — LIDOCAINE 2% (20 MG/ML) 5 ML SYRINGE
INTRAMUSCULAR | Status: DC | PRN
  Administered 2018-06-11: 100 mg via INTRAVENOUS

## 2018-06-11 MED ORDER — ONDANSETRON HCL 4 MG/2ML IJ SOLN
INTRAMUSCULAR | Status: AC
  Filled 2018-06-11: qty 2

## 2018-06-11 MED ORDER — MIDAZOLAM HCL 2 MG/2ML IJ SOLN
INTRAMUSCULAR | Status: DC | PRN
  Administered 2018-06-11: 2 mg via INTRAVENOUS

## 2018-06-11 MED ORDER — BACITRACIN ZINC 500 UNIT/GM EX OINT
TOPICAL_OINTMENT | CUTANEOUS | Status: AC
  Filled 2018-06-11: qty 28.35

## 2018-06-11 MED ORDER — ONDANSETRON HCL 4 MG/2ML IJ SOLN: 4.0000 mg | Freq: Once | INTRAMUSCULAR | Status: DC | PRN

## 2018-06-11 MED ORDER — POLYVINYL ALCOHOL 1.4 % OP SOLN
1.0000 [drp] | Freq: Two times a day (BID) | OPHTHALMIC | Status: DC
  Administered 2018-06-11 – 2018-06-12 (×3): 1 [drp] via OPHTHALMIC
  Filled 2018-06-11: qty 15

## 2018-06-11 SURGICAL SUPPLY — 32 items
BLADE AVERAGE 25X9 (BLADE) IMPLANT
CANISTER SUCT 3000ML PPV (MISCELLANEOUS) ×2 IMPLANT
CANISTER WOUND CARE 500ML ATS (WOUND CARE) ×1 IMPLANT
COVER SURGICAL LIGHT HANDLE (MISCELLANEOUS) ×2 IMPLANT
DRAPE EXTREMITY T 121X128X90 (DRAPE) ×2 IMPLANT
DRSG VAC ATS SM SENSATRAC (GAUZE/BANDAGES/DRESSINGS) ×1 IMPLANT
ELECT REM PT RETURN 9FT ADLT (ELECTROSURGICAL) ×2
ELECTRODE REM PT RTRN 9FT ADLT (ELECTROSURGICAL) ×1 IMPLANT
GAUZE SPONGE 2X2 8PLY STRL LF (GAUZE/BANDAGES/DRESSINGS) IMPLANT
GLOVE BIO SURGEON STRL SZ7.5 (GLOVE) ×2 IMPLANT
GLOVE BIOGEL PI IND STRL 6.5 (GLOVE) IMPLANT
GLOVE BIOGEL PI IND STRL 7.0 (GLOVE) IMPLANT
GLOVE BIOGEL PI IND STRL 8 (GLOVE) ×1 IMPLANT
GLOVE BIOGEL PI INDICATOR 6.5 (GLOVE) ×1
GLOVE BIOGEL PI INDICATOR 7.0 (GLOVE) ×1
GLOVE BIOGEL PI INDICATOR 8 (GLOVE) ×1
GLOVE ECLIPSE 7.5 STRL STRAW (GLOVE) ×1 IMPLANT
GLOVE SURG SS PI 6.0 STRL IVOR (GLOVE) ×1 IMPLANT
GOWN STRL REUS W/ TWL LRG LVL3 (GOWN DISPOSABLE) ×3 IMPLANT
GOWN STRL REUS W/TWL LRG LVL3 (GOWN DISPOSABLE) ×3
KIT BASIN OR (CUSTOM PROCEDURE TRAY) ×2 IMPLANT
KIT TURNOVER KIT B (KITS) ×2 IMPLANT
NS IRRIG 1000ML POUR BTL (IV SOLUTION) ×2 IMPLANT
PACK GENERAL/GYN (CUSTOM PROCEDURE TRAY) ×2 IMPLANT
PAD ARMBOARD 7.5X6 YLW CONV (MISCELLANEOUS) ×4 IMPLANT
SPONGE GAUZE 2X2 STER 10/PKG (GAUZE/BANDAGES/DRESSINGS) ×1
SUT ETHILON 3 0 PS 1 (SUTURE) ×2 IMPLANT
SWAB COLLECTION DEVICE MRSA (MISCELLANEOUS) ×1 IMPLANT
SWAB CULTURE ESWAB REG 1ML (MISCELLANEOUS) ×1 IMPLANT
TOWEL GREEN STERILE (TOWEL DISPOSABLE) ×4 IMPLANT
UNDERPAD 30X30 (UNDERPADS AND DIAPERS) ×2 IMPLANT
WATER STERILE IRR 1000ML POUR (IV SOLUTION) ×2 IMPLANT

## 2018-06-11 NOTE — Progress Notes (Addendum)
[facility] Progress Note   Subjective:  Underwent toe amp this am Seen in room after surgery. No complaints. Pain controlled Says she may be going home tomorrow   Objective Vitals:   06/11/18 1115 06/11/18 1130 06/11/18 1220 06/11/18 1431  BP: 131/68 123/64  120/65  Pulse: 77 73 74 78  Resp:      Temp:      TempSrc:      SpO2: 99% 98% 98% 97%  Weight:      Height:       Physical Exam General: WNWD female NAD  Heart: RRR Lungs: CTAB Abdomen: soft NT/ND Extremities: R foot bandaged wound vac in place. No LE edema  Dialysis Access: LUE AVF +bruit   Dialysis: AF MWF  3h 37min   400/600  82kg  2/2.5 bath LUA AVF  Hep 2300 (pt refusing due to vitreous hemorrhage) - venofer 50 /wk - hect 5 - aranesp 40 last 7/5 - recent Hb 10.3 tsat 13%  ferr 528  pth 201  P 6      Impression: 1 R foot ulcer/ infection: on vanc/Rocephin/Flagyl . R fifth toe amputation today per Dr.  Scot Dock  2 ESRD on HD MWF. Next HD 7/12.  3  Volume - down to dry wt. Post HD weight on 7/10  81.3kg  4  DM severe complications 5  Anemia ckd - hb 9.6, gets darbe 40 q Friday  6  Hx MRSA endocarditis 7  HTN - severe, controlled, on 4 bp meds high dose 8  Chronic back pain - disabled from this and other issues, mostly nonamb per notes Dispo: Pending PT eval    Lynnda Child PA-C [place] Pager [phone] 06/11/2018,3:06 PM  LOS: 4 days   Pt seen, examined and agree w A/P as above.  Kelly Splinter MD [facility] pager [phone]   06/11/2018, 3:50 PM     Additional Objective Labs: Basic Metabolic Panel: Recent Labs  Lab 06/07/18 2206 06/09/18 0842 06/10/18 0338  NA 132* 138 135  K 5.5* 4.4 4.7  CL 96* 98 96*  CO2 24 25 24   GLUCOSE 243* 116* 205*  BUN 52* 47* 64*  CREATININE 8.69* 7.91* 9.06*  CALCIUM 9.1 8.9 9.0  PHOS 5.4*  --  5.5*   CBC: Recent Labs  Lab 06/06/18 2156 06/07/18 0227 06/07/18 2206 06/09/18 0842  WBC 15.4*  13.5* 11.0* 10.1  NEUTROABS 11.7* 9.5*  --   --   HGB 10.6* 9.7* 9.6* 9.5*  HCT 34.9* 30.8* 30.6* 30.8*  MCV 95.9 93.6 93.9 93.3  PLT 269 240 245 247   Blood Culture    Component Value Date/Time   SDES WOUND 06/11/2018 0757   SPECREQUEST NONE 06/11/2018 0757   CULT PENDING 06/11/2018 0757   REPTSTATUS PENDING 06/11/2018 0757    Cardiac Enzymes: No results for input(s): CKTOTAL, CKMB, CKMBINDEX, TROPONINI in the last 168 hours. CBG: Recent Labs  Lab 06/10/18 2050 06/11/18 0606 06/11/18 0834 06/11/18 0949 06/11/18 1152  GLUCAP 234* 195* 121* 117* 166*   Iron Studies: No results for input(s): IRON, TIBC, TRANSFERRIN, FERRITIN in the last 72 hours. No results found for: INR, PROTIME Medications: . sodium chloride    . sodium chloride    . sodium chloride    . cefTRIAXone (ROCEPHIN)  IV Stopped (06/11/18 0244)  . metronidazole Stopped (06/11/18 0127)  . vancomycin Stopped (06/10/18 1050)   . amLODipine  10 mg Oral Daily  . calcium acetate  1,334  mg Oral TID WC  . Chlorhexidine Gluconate Cloth  6 each Topical Q0600  . collagenase   Topical Daily  . [START ON 06/12/2018] darbepoetin (ARANESP) injection - DIALYSIS  40 mcg Intravenous Q Fri-HD  . doxercalciferol  5 mcg Intravenous Q M,W,F-HD  . famotidine  20 mg Oral Daily  . gabapentin  100 mg Oral TID  . hydrALAZINE  100 mg Oral Q8H  . insulin aspart  0-5 Units Subcutaneous QHS  . insulin aspart  0-9 Units Subcutaneous TID WC  . insulin glargine  10 Units Subcutaneous QHS  . labetalol  200 mg Oral BID  . [START ON 06/12/2018] LORazepam  0.5 mg Oral Q M,W,F-HD  . losartan  100 mg Oral Daily  . multivitamin  1 tablet Oral QHS  . mupirocin ointment  1 application Nasal BID  . nutrition supplement (JUVEN)  1 packet Oral BID BM  . polyvinyl alcohol  1 drop Both Eyes BID  . saccharomyces boulardii  250 mg Oral BID  . sodium chloride flush  3 mL Intravenous Q12H

## 2018-06-11 NOTE — Progress Notes (Signed)
[facility] PROGRESS NOTE  Karen [name] ERD:[id number] DOB: [birth year] DOA: 06/06/2018  PCP: Wynn Banker, PA  Brief History/Interval Summary: 42 year old female with a past medical history of insulin-dependent diabetes mellitus since the age of 46, end-stage renal disease on hemodialysis for the past 1 year, Hypertension, diabetic foot ulcer presented with fever and redness and swelling around her wound.  She was recently seen by podiatry as an outpatient and was placed on ciprofloxacin and clindamycin.  She completed the course of these antibiotics.  She was admitted for further management.  She was seen by vascular surgery and underwent angiogram.  She will need amputation of the left fifth toe.  Reason for Visit: Osteomyelitis of the left fifth toe  Consultants: Vascular surgery.  Nephrology.  Procedures:  Hemodialysis.    Lower extremity angiogram The aorta and iliac segments are small but there is no flow-limiting stenosis.  On the left leg she has run off which is very quick at least via the anterior tibial artery.  On the right side she possibly has a non-flow-limiting above the knee popliteal artery stenosis.  The anterior tibial runoff to the foot gives off the DP.  Posterior tibial artery is initially stenotic at approximately 70% but terminates at the ankle.  No intervention was undertaken.  Antibiotics: On vancomycin, ceftriaxone and Flagyl  Subjective/Interval History: Patient seen after she returned from the OR.  Still somewhat somnolent.  Denies any complaints.  ROS: Denies any shortness of breath.  No nausea or vomiting.  Objective:  Vital Signs  Vitals:   06/11/18 0952 06/11/18 1100 06/11/18 1115 06/11/18 1130  BP: 122/73 122/67 131/68 123/64  Pulse: 79 77 77 73  Resp:      Temp: 98 F (36.7 C)     TempSrc: Oral     SpO2: 94% 96% 99% 98%  Weight:      Height:        Intake/Output Summary (Last 24 hours) at 06/11/2018 1217 Last  data filed at 06/11/2018 0806 Gross per 24 hour  Intake 1145.65 ml  Output 75 ml  Net 1070.65 ml   Filed Weights   06/10/18 0700 06/10/18 1100 06/11/18 0500  Weight: 82.3 kg (181 lb 7 oz) 81.3 kg (179 lb 3.7 oz) 82.5 kg (181 lb 14.1 oz)    General appearance: Somnolent but easily arousable. Resp: Clear to auscultation bilaterally.  No wheezing rales or rhonchi Cardio: S1-S2 is normal regular.  No S3-S4.  No rubs murmurs GI: Soft.  Nontender nondistended.  Bowel sounds are present normal. Extremities: Right foot covered in dressing Neurologic: No obvious focal neurological deficits.  Lab Results:  Data Reviewed: I have personally reviewed following labs and imaging studies  CBC: Recent Labs  Lab 06/06/18 2156 06/07/18 0227 06/07/18 2206 06/09/18 0842  WBC 15.4* 13.5* 11.0* 10.1  NEUTROABS 11.7* 9.5*  --   --   HGB 10.6* 9.7* 9.6* 9.5*  HCT 34.9* 30.8* 30.6* 30.8*  MCV 95.9 93.6 93.9 93.3  PLT 269 240 245 563    Basic Metabolic Panel: Recent Labs  Lab 06/06/18 2156 06/07/18 0227 06/07/18 2206 06/09/18 0842 06/10/18 0338  NA 137 136 132* 138 135  K 4.8 4.8 5.5* 4.4 4.7  CL 100 98 96* 98 96*  CO2 26 27 24 25 24   GLUCOSE 173* 211* 243* 116* 205*  BUN 37* 39* 52* 47* 64*  CREATININE 6.98* 7.26* 8.69* 7.91* 9.06*  CALCIUM 9.4 9.0 9.1 8.9 9.0  PHOS  --   --  5.4*  --  5.5*    GFR: Estimated Creatinine Clearance: 8.7 mL/min (A) (by C-G formula based on SCr of 9.06 mg/dL (H)).  Liver Function Tests: Recent Labs  Lab 06/06/18 2156 06/07/18 2206 06/10/18 0338  AST 17  --   --   ALT 15  --   --   ALKPHOS 157*  --   --   BILITOT 0.8  --   --   PROT 6.7  --   --   ALBUMIN 3.2* 2.8* 2.7*     CBG: Recent Labs  Lab 06/10/18 2050 06/11/18 0606 06/11/18 0834 06/11/18 0949 06/11/18 1152  GLUCAP 234* 195* 121* 117* 166*     Recent Results (from the past 240 hour(s))  Blood Cultures x 2 sites     Status: None (Preliminary result)   Collection Time:  06/07/18  2:20 AM  Result Value Ref Range Status   Specimen Description BLOOD RIGHT HAND  Final   Special Requests   Final    BOTTLES DRAWN AEROBIC AND ANAEROBIC Blood Culture adequate volume   Culture   Final    NO GROWTH 4 DAYS Performed at [facility], [place] [address], [place], [place] [address]    Report Status PENDING  Incomplete  Blood Cultures x 2 sites     Status: None (Preliminary result)   Collection Time: 06/07/18  2:30 AM  Result Value Ref Range Status   Specimen Description BLOOD RIGHT HAND  Final   Special Requests   Final    BOTTLES DRAWN AEROBIC AND ANAEROBIC Blood Culture adequate volume   Culture   Final    NO GROWTH 4 DAYS Performed at [facility], [place] [address], [place], [place] [address]    Report Status PENDING  Incomplete  Surgical pcr screen     Status: Abnormal   Collection Time: 06/08/18  6:25 AM  Result Value Ref Range Status   MRSA, PCR POSITIVE (A) NEGATIVE Final    Comment: RESULT CALLED TO, READ BACK BY AND VERIFIED WITH: B. Sloop RN 9:40 06/08/18 ([email or web address])    Staphylococcus aureus POSITIVE (A) NEGATIVE Final    Comment: (NOTE) The Xpert SA Assay (FDA approved for NASAL specimens in patients 61 years of age and older), is one component of a comprehensive surveillance program. It is not intended to diagnose infection nor to guide or monitor treatment. Performed at [facility], [place] [address], [place], [place] [address]       Radiology Studies: No results found.   Medications:  Scheduled: . amLODipine  10 mg Oral Daily  . calcium acetate  1,334 mg Oral TID WC  . Chlorhexidine Gluconate Cloth  6 each Topical Q0600  . collagenase   Topical Daily  . [START ON 06/12/2018] darbepoetin (ARANESP) injection - DIALYSIS  40 mcg Intravenous Q Fri-HD  . doxercalciferol  5 mcg Intravenous Q M,W,F-HD  . famotidine  20 mg Oral Daily  . gabapentin  100 mg Oral TID  . hydrALAZINE  100 mg Oral Q8H  . insulin aspart  0-5 Units  Subcutaneous QHS  . insulin aspart  0-9 Units Subcutaneous TID WC  . insulin glargine  10 Units Subcutaneous QHS  . Insulin Glargine  10 Units Subcutaneous QHS  . labetalol  200 mg Oral BID  . LORazepam  0.5 mg Oral See admin instructions  . losartan  100 mg Oral Daily  . multivitamin  1 tablet Oral QHS  . mupirocin ointment  1  application Nasal BID  . nutrition supplement (JUVEN)  1 packet Oral BID BM  . polyvinyl alcohol  1 drop Both Eyes BID  . saccharomyces boulardii  250 mg Oral BID  . sodium chloride flush  3 mL Intravenous Q12H   Continuous: . sodium chloride    . sodium chloride    . sodium chloride    . cefTRIAXone (ROCEPHIN)  IV Stopped (06/11/18 0244)  . metronidazole Stopped (06/11/18 0127)  . 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  Underwent angiogram which does suggest adequate circulation.  No intervention was performed.  Patient underwent amputation of the fifth toe this morning.  Wound VAC in place.  Patient remains on vancomycin and ceftriaxone and Flagyl.  Some of these antibiotics could be de-escalated.  Will need vascular surgery input.  Continue probiotics.  Essential hypertension Continue with home medications.  End-stage renal disease on hemodialysis on Monday.  Nephrology is following.    Insulin-dependent diabetes mellitus Monitor CBGs.  Continue Lantus.  DVT Prophylaxis: SCDs.     Code Status: Full code Family Communication:  Discussed with the patient Disposition Plan: Management as outlined above.  Disposition per vascular surgery.    LOS: 4 days   [facility] Pager [phone] 06/11/2018, 12:17 PM  If 7PM-7AM, please contact night-coverage at [email or web address], password [facility]

## 2018-06-11 NOTE — Anesthesia Procedure Notes (Signed)
Procedure Name: LMA Insertion Date/Time: 06/11/2018 7:40 AM Performed by: Renato Shin, CRNA Pre-anesthesia Checklist: Patient identified, Emergency Drugs available, Suction available and Patient being monitored Patient Re-evaluated:Patient Re-evaluated prior to induction Oxygen Delivery Method: Circle system utilized Preoxygenation: Pre-oxygenation with 100% oxygen Induction Type: IV induction Ventilation: Mask ventilation without difficulty LMA: LMA with gastric port inserted LMA Size: 4.0 Placement Confirmation: positive ETCO2,  CO2 detector and breath sounds checked- equal and bilateral Tube secured with: Tape Dental Injury: Teeth and Oropharynx as per pre-operative assessment

## 2018-06-11 NOTE — Transfer of Care (Signed)
Immediate Anesthesia Transfer of Care Note  Patient: Karen [name]  Procedure(s) Performed: AMPUTATION RIGHT FIFTH TOE (Right Foot)  Patient Location: PACU  Anesthesia Type:General  Level of Consciousness: awake, alert , drowsy and patient cooperative  Airway & Oxygen Therapy: Patient Spontanous Breathing and Patient connected to nasal cannula oxygen  Post-op Assessment: Report given to RN and Post -op Vital signs reviewed and stable  Post vital signs: Reviewed and stable  Last Vitals:  Vitals Value Taken Time  BP 105/58 06/11/2018  8:32 AM  Temp    Pulse 73 06/11/2018  8:33 AM  Resp 15 06/11/2018  8:33 AM  SpO2 98 % 06/11/2018  8:33 AM  Vitals shown include unvalidated device data.  Last Pain:  Vitals:   06/11/18 0545  TempSrc:   PainSc: 0-No pain      Patients Stated Pain Goal: 1 (15/94/58 5929)  Complications: No apparent anesthesia complications

## 2018-06-11 NOTE — Interval H&P Note (Signed)
History and Physical Interval Note:  06/11/2018 7:16 AM  [facility]  has presented today for surgery, with the diagnosis of gangrene right fifth toe  The various methods of treatment have been discussed with the patient and family. After consideration of risks, benefits and other options for treatment, the patient has consented to  Procedure(s): AMPUTATION RIGHT FIFTH TOE (Right) as a surgical intervention .  The patient's history has been reviewed, patient examined, no change in status, stable for surgery.  I have reviewed the patient's chart and labs.  Questions were answered to the patient's satisfaction.     Deitra Mayo

## 2018-06-11 NOTE — Anesthesia Preprocedure Evaluation (Signed)
Anesthesia Evaluation  Patient identified by MRN, date of birth, ID band Patient awake    Reviewed: Allergy & Precautions, NPO status , Patient's Chart, lab work & pertinent test results  Airway Mallampati: I  TM Distance: >3 FB Neck ROM: Full    Dental   Pulmonary Current Smoker,    Pulmonary exam normal        Cardiovascular hypertension, Pt. on medications Normal cardiovascular exam     Neuro/Psych    GI/Hepatic   Endo/Other  diabetes, Type 2, Insulin Dependent  Renal/GU ESRF and DialysisRenal disease     Musculoskeletal   Abdominal   Peds  Hematology   Anesthesia Other Findings   Reproductive/Obstetrics                             Anesthesia Physical Anesthesia Plan  ASA: III  Anesthesia Plan: General   Post-op Pain Management:    Induction:   PONV Risk Score and Plan: 2 and Ondansetron and Treatment may vary due to age or medical condition  Airway Management Planned: LMA  Additional Equipment:   Intra-op Plan:   Post-operative Plan: Extubation in OR  Informed Consent: I have reviewed the patients History and Physical, chart, labs and discussed the procedure including the risks, benefits and alternatives for the proposed anesthesia with the patient or authorized representative who has indicated his/her understanding and acceptance.     Plan Discussed with: CRNA and Surgeon  Anesthesia Plan Comments:         Anesthesia Quick Evaluation

## 2018-06-11 NOTE — Op Note (Signed)
    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  Ray amputation of right fifth toe 2.  Placement of VAC  SURGEON: Judeth Cornfield. Scot Dock, MD, FACS  ASSIST: None  ANESTHESIA: General  EBL: Minimal  INDICATIONS:    Karen [name] is a 42 y.o. female who presented with a diabetic foot infection at the metatarsal head lateral to the fifth metatarsal.  MRI suggested osteomyelitis of the toe.  She presents for toe amputation after preoperative arteriogram showed adequate circulation for healing.  FINDINGS:   Good bleeding at the site of the amputation.  TECHNIQUE:   The patient was taken to the operating room and received a general anesthetic.  The right foot was prepped and draped in the usual sterile fashion.  An elliptical incision was made encompassing the necrotic area and the fifth toe.  The dissection was carried back to the metatarsal.  This was divided using a CD4 saw.  There was good bleeding.  Hemostasis was obtained using electrocautery.  Given that there would have been too much tension on the wound to close this primarily I elected to place a VAC.  The wound was 31 mm in width, 57 mm in length, and 12 mm in depth.  This was placed without difficulty.  There was an excellent seal.  The patient tolerated the procedure well was transferred to the recovery room in stable condition.  All needle and sponge counts were correct.  Deitra Mayo, MD, FACS [facility]  DATE OF DICTATION:   06/11/2018

## 2018-06-11 NOTE — Anesthesia Postprocedure Evaluation (Signed)
Anesthesia Post Note  Patient: Oncologist  Procedure(s) Performed: AMPUTATION RIGHT FIFTH TOE (Right Foot)     Patient location during evaluation: PACU Anesthesia Type: General Level of consciousness: awake and alert Pain management: pain level controlled Vital Signs Assessment: post-procedure vital signs reviewed and stable Respiratory status: spontaneous breathing, nonlabored ventilation, respiratory function stable and patient connected to nasal cannula oxygen Cardiovascular status: blood pressure returned to baseline and stable Postop Assessment: no apparent nausea or vomiting Anesthetic complications: no    Last Vitals:  Vitals:   06/11/18 1130 06/11/18 1220  BP: 123/64   Pulse: 73 74  Resp:    Temp:    SpO2: 98% 98%    Last Pain:  Vitals:   06/11/18 0952  TempSrc: Oral  PainSc: Asleep                 Luie Laneve DAVID

## 2018-06-12 ENCOUNTER — Encounter (HOSPITAL_COMMUNITY): Payer: Self-pay | Admitting: Vascular Surgery

## 2018-06-12 LAB — BASIC METABOLIC PANEL
ANION GAP: 13 (ref 5–15)
BUN: 49 mg/dL — ABNORMAL HIGH (ref 6–20)
CALCIUM: 9.2 mg/dL (ref 8.9–10.3)
CO2: 26 mmol/L (ref 22–32)
Chloride: 97 mmol/L — ABNORMAL LOW (ref 98–111)
Creatinine, Ser: 7.42 mg/dL — ABNORMAL HIGH (ref 0.44–1.00)
GFR calc non Af Amer: 6 mL/min — ABNORMAL LOW (ref >60)
GFR, EST AFRICAN AMERICAN: 7 mL/min — AB (ref >60)
Glucose, Bld: 298 mg/dL — ABNORMAL HIGH (ref 70–99)
Potassium: 4.6 mmol/L (ref 3.5–5.1)
Sodium: 136 mmol/L (ref 135–145)

## 2018-06-12 LAB — CULTURE, BLOOD (ROUTINE X 2)
CULTURE: NO GROWTH
Culture: NO GROWTH
Special Requests: ADEQUATE
Special Requests: ADEQUATE

## 2018-06-12 LAB — CBC
HEMATOCRIT: 30.3 % — AB (ref 36.0–46.0)
Hemoglobin: 9.2 g/dL — ABNORMAL LOW (ref 12.0–15.0)
MCH: 28.8 pg (ref 26.0–34.0)
MCHC: 30.4 g/dL (ref 30.0–36.0)
MCV: 95 fL (ref 78.0–100.0)
Platelets: 279 10*3/uL (ref 150–400)
RBC: 3.19 MIL/uL — ABNORMAL LOW (ref 3.87–5.11)
RDW: 14.2 % (ref 11.5–15.5)
WBC: 8.8 10*3/uL (ref 4.0–10.5)

## 2018-06-12 LAB — GLUCOSE, CAPILLARY
GLUCOSE-CAPILLARY: 278 mg/dL — AB (ref 70–99)
GLUCOSE-CAPILLARY: 212 mg/dL — AB (ref 70–99)
Glucose-Capillary: 131 mg/dL — ABNORMAL HIGH (ref 70–99)

## 2018-06-12 MED ORDER — SACCHAROMYCES BOULARDII 250 MG PO CAPS: 250.0000 mg | ORAL_CAPSULE | Freq: Two times a day (BID) | ORAL | 0 refills | Status: AC

## 2018-06-12 MED ORDER — LOSARTAN POTASSIUM 100 MG PO TABS: 100.0000 mg | ORAL_TABLET | Freq: Every day | ORAL | 0 refills | Status: DC

## 2018-06-12 MED ORDER — DOXERCALCIFEROL 4 MCG/2ML IV SOLN
INTRAVENOUS | Status: AC
  Administered 2018-06-12: 5 ug via INTRAVENOUS
  Filled 2018-06-12: qty 4

## 2018-06-12 MED ORDER — DARBEPOETIN ALFA 40 MCG/0.4ML IJ SOSY
PREFILLED_SYRINGE | INTRAMUSCULAR | Status: AC
  Administered 2018-06-12: 40 ug via INTRAVENOUS
  Filled 2018-06-12: qty 0.4

## 2018-06-12 MED ORDER — DOXYCYCLINE HYCLATE 100 MG PO TABS: 100.0000 mg | ORAL_TABLET | Freq: Two times a day (BID) | ORAL | 0 refills | Status: AC

## 2018-06-12 MED ORDER — METRONIDAZOLE IN NACL 5-0.79 MG/ML-% IV SOLN: 500.0000 mg | Freq: Three times a day (TID) | INTRAVENOUS | Status: DC

## 2018-06-12 MED ORDER — SODIUM CHLORIDE 0.9 % IV SOLN: 100.0000 mL | INTRAVENOUS | Status: DC | PRN

## 2018-06-12 MED ORDER — AMLODIPINE BESYLATE 10 MG PO TABS: 10.0000 mg | ORAL_TABLET | Freq: Every day | ORAL | 0 refills | Status: DC

## 2018-06-12 NOTE — Progress Notes (Signed)
   VASCULAR SURGERY ASSESSMENT & PLAN:   POD 1 S/P ray amputation of the right fifth toe with placement of a VAC  I have consulted physical therapy for ambulation (partial weightbearing right leg heel only using Darco shoe)  END-STAGE RENAL DISEASE: The patient is currently on dialysis.  ID: OR culture Gram stain had no organisms seen.  Now that the infected bone has been removed I think she could be converted to p.o. antibiotics tomorrow.  She is currently on intravenous Rocephin, Flagyl, and vancomycin.  From my standpoint she can go home when she can learn to walk with her Darco shoe and arrangements can be made for her VAC changes. The wound was 31 mm in width, 57 mm in length, and 12 mm in depth.  SUBJECTIVE:   No specific complaints.  PHYSICAL EXAM:   Vitals:   06/12/18 0830 06/12/18 0900 06/12/18 0930 06/12/18 1000  BP: 134/71 135/70 (!) 164/80 (!) 141/73  Pulse: 84 83 86 83  Resp:      Temp:      TempSrc:      SpO2:      Weight:      Height:       The VAC has a good seal.  LABS:   Lab Results  Component Value Date   WBC 8.8 06/12/2018   HGB 9.2 (L) 06/12/2018   HCT 30.3 (L) 06/12/2018   MCV 95.0 06/12/2018   PLT 279 06/12/2018   Lab Results  Component Value Date   CREATININE 7.42 (H) 06/12/2018   No results found for: INR, PROTIME CBG (last 3)  Recent Labs    06/11/18 2008 06/12/18 0622 06/12/18 0914  GLUCAP 330* 278* 131*    PROBLEM LIST:    Principal Problem:   Diabetic foot infection ([place]) Active Problems:   ESRD on hemodialysis ([facility])   IDDM (insulin dependent diabetes mellitus) ([place])   CURRENT MEDS:   . Darbepoetin Alfa      . doxercalciferol      . amLODipine  10 mg Oral Daily  . calcium acetate  1,334 mg Oral TID WC  . Chlorhexidine Gluconate Cloth  6 each Topical Q0600  . collagenase   Topical Daily  . darbepoetin (ARANESP) injection - DIALYSIS  40 mcg Intravenous Q Fri-HD  . doxercalciferol  5 mcg Intravenous Q M,W,F-HD  .  famotidine  20 mg Oral Daily  . gabapentin  100 mg Oral TID  . hydrALAZINE  100 mg Oral Q8H  . insulin aspart  0-5 Units Subcutaneous QHS  . insulin aspart  0-9 Units Subcutaneous TID WC  . insulin glargine  10 Units Subcutaneous QHS  . labetalol  200 mg Oral BID  . LORazepam  0.5 mg Oral Q M,W,F-HD  . losartan  100 mg Oral Daily  . multivitamin  1 tablet Oral QHS  . mupirocin ointment  1 application Nasal BID  . nutrition supplement (JUVEN)  1 packet Oral BID BM  . polyvinyl alcohol  1 drop Both Eyes BID  . saccharomyces boulardii  250 mg Oral BID  . sodium chloride flush  3 mL Intravenous Q12H    Deitra Mayo Beeper: [phone] Office: [phone] 06/12/2018

## 2018-06-12 NOTE — Progress Notes (Signed)
PT Cancellation Note  Patient Details Name: Karen [name] MRN: [id number] DOB: [birth year]   Cancelled Treatment:    Reason Eval/Treat Not Completed: Patient at procedure or test/unavailable (HD).  Ellamae Sia, PT, DPT Acute Rehabilitation Services  Pager: [phone]    Karen [name] 06/12/2018, 8:16 AM

## 2018-06-12 NOTE — Progress Notes (Signed)
Orthopedic Tech Progress Note Patient Details:  Karen [name] 1976/10/27 [id number]  Ortho Devices Type of Ortho Device: Darco shoe Ortho Device/Splint Location: rle Ortho Device/Splint Interventions: Application   Post Interventions Patient Tolerated: Well Instructions Provided: Care of device   [name] 06/12/2018, 2:28 PM

## 2018-06-12 NOTE — Discharge Summary (Signed)
[facility]  Physician Discharge Summary   Patient ID: Karen [name] MRN: [id number] DOB/AGE: 02-11-1976 42 y.o.  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 Home health ordered.  Patient going home with wound VAC.   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  She was recently seen by podiatry as an outpatient and was placed on ciprofloxacin and clindamycin.  She completed the course of these antibiotics.  She was admitted for further management.  She was seen by vascular surgery and underwent angiogram.  She underwent amputation of the right fifth toe.  Reason for Visit: Osteomyelitis of the right fifth toe  Consultants: Vascular surgery.  Nephrology.  Procedures:  Hemodialysis.    Lower extremity angiogram The aorta and iliac segments are small but there is no flow-limiting stenosis. On the left leg she has run off which is very quick at least via the anterior tibial artery. On the right side she possibly has a non-flow-limiting above the knee popliteal artery stenosis. The anterior tibial runoff to the foot gives off the DP. Posterior tibial artery is initially stenotic at approximately 70% but terminates at the ankle. No intervention was undertaken.  7/11: 1.Ray amputation of right fifth toe 2.Placement of [place] COURSE:    Diabetic foot infection with osteomyelitis of the right fifth toe Patient seen by vascular surgery.  Underwent angiogram which suggested adequate circulation.  No intervention was performed.  Patient underwent amputation of the fifth toe on 7/11.  Wound VAC in place.    Patient was started on vancomycin and ceftriaxone and Flagyl at the time of admission.  This was continued.  Vascular surgery recommended continuing IV antibiotics through today and then change to oral tomorrow.  Cultures from the surgical site growing staph aureus.  Blood cultures were negative.  Patient's MRSA PCR is positive.  Patient mentioned that she wanted to go home.  She was quite adamant about it.  Since patient was quite adamant about her left AMA it was felt that it was safe to discharge her with prescription for antibiotics.  She will be discharged on doxycycline.  She was told that this is less than ideal circumstance for discharge.  Health already ordered.   Essential hypertension Blood pressure is stable.  Continue home medications.  End-stage renal disease on hemodialysis on Monday Wednesday and Friday.  Seen by nephrology here in the hospital.  Was dialyzed as per her usual schedule.   Insulin-dependent diabetes mellitus CBGs reasonably well controlled. Continue Lantus.  Stable.  Patient adamant about going home.  She was discharged.     PERTINENT LABS:  The results of significant diagnostics from this hospitalization (including imaging, microbiology, ancillary and laboratory) are listed below for reference.    Microbiology: Recent Results (from the past 240 hour(s))  Blood Cultures x 2 sites     Status: None   Collection Time: 06/07/18  2:20 AM  Result Value  Ref Range Status   Specimen Description BLOOD RIGHT HAND  Final   Special Requests   Final    BOTTLES DRAWN AEROBIC AND ANAEROBIC Blood Culture adequate volume   Culture   Final    NO GROWTH 5 DAYS Performed at [facility],  [address], [place], [place] [address]    Report Status 06/12/2018 FINAL  Final  Blood Cultures x 2 sites     Status: None   Collection Time: 06/07/18  2:30 AM  Result Value Ref Range Status   Specimen Description BLOOD RIGHT HAND  Final   Special Requests   Final    BOTTLES DRAWN AEROBIC AND ANAEROBIC Blood Culture adequate volume   Culture   Final    NO GROWTH 5 DAYS Performed at [facility], [place] [address], [place], [place] [address]    Report Status 06/12/2018 FINAL  Final  Surgical pcr screen     Status: Abnormal   Collection Time: 06/08/18  6:25 AM  Result Value Ref Range Status   MRSA, PCR POSITIVE (A) NEGATIVE Final    Comment: RESULT CALLED TO, READ BACK BY AND VERIFIED WITH: B. Sloop RN 9:40 06/08/18 ([email or web address])    Staphylococcus aureus POSITIVE (A) NEGATIVE Final    Comment: (NOTE) The Xpert SA Assay (FDA approved for NASAL specimens in patients 90 years of age and older), is one component of a comprehensive surveillance program. It is not intended to diagnose infection nor to guide or monitor treatment. Performed at [facility], [place] [address] [place], [place] [address]   Aerobic/Anaerobic Culture (surgical/deep wound)     Status: None (Preliminary result)   Collection Time: 06/11/18  7:57 AM  Result Value Ref Range Status   Specimen Description WOUND  Final   Special Requests NONE  Final   Gram Stain NO WBC SEEN NO ORGANISMS SEEN   Final   Culture   Final    RARE STAPHYLOCOCCUS AUREUS SUSCEPTIBILITIES TO FOLLOW Performed at [facility], [place] [address], [place], [place] [address]    Report Status PENDING  Incomplete     Labs: Basic Metabolic Panel: Recent Labs  Lab 06/07/18 0227 06/07/18 2206 06/09/18 0842 06/10/18 0338 06/12/18 0326  NA 136 132* 138 135 136  K 4.8 5.5* 4.4 4.7 4.6  CL 98 96* 98 96* 97*  CO2 27 24 25 24 26   GLUCOSE 211* 243* 116* 205* 298*  BUN 39* 52* 47* 64* 49*  CREATININE 7.26* 8.69* 7.91* 9.06* 7.42*   CALCIUM 9.0 9.1 8.9 9.0 9.2  PHOS  --  5.4*  --  5.5*  --    Liver Function Tests: Recent Labs  Lab 06/06/18 2156 06/07/18 2206 06/10/18 0338  AST 17  --   --   ALT 15  --   --   ALKPHOS 157*  --   --   BILITOT 0.8  --   --   PROT 6.7  --   --   ALBUMIN 3.2* 2.8* 2.7*   CBC: Recent Labs  Lab 06/06/18 2156 06/07/18 0227 06/07/18 2206 06/09/18 0842 06/12/18 0326  WBC 15.4* 13.5* 11.0* 10.1 8.8  NEUTROABS 11.7* 9.5*  --   --   --   HGB 10.6* 9.7* 9.6* 9.5* 9.2*  HCT 34.9* 30.8* 30.6* 30.8* 30.3*  MCV 95.9 93.6 93.9 93.3 95.0  PLT 269 240 245 247 279    CBG: Recent Labs  Lab 06/11/18 1538 06/11/18 2008 06/12/18 0622 06/12/18  0914 06/12/18 1130  GLUCAP 356* 330* 278* 131* 212*     IMAGING STUDIES Ct Angio Ao+bifem W & Or Wo Contrast  Result Date: 06/11/2018 CLINICAL DATA:  42 year old female with a history of right foot infection and multiple cardiovascular risk factors. EXAM: CT ANGIOGRAPHY OF ABDOMINAL AORTA WITH ILIOFEMORAL RUNOFF TECHNIQUE: Multidetector CT imaging of the abdomen, pelvis and lower extremities was performed using the standard protocol during bolus administration of intravenous contrast. Multiplanar CT image reconstructions and MIPs were obtained to evaluate the vascular anatomy. CONTRAST:  117mL ISOVUE-370 IOPAMIDOL (ISOVUE-370) INJECTION 76% COMPARISON:  None. FINDINGS: VASCULAR Aorta: No aneurysm or dissection flap. No periaortic fluid. Mild atherosclerotic changes of the abdominal aorta at the distal aorta. Diameter of infrarenal aorta at the IMA origin measures 13 mm. Celiac: Celiac artery patent without atherosclerotic changes at the origin. Branch vessels are patent. SMA: Superior mesenteric artery is patent without atherosclerotic changes at the origin. Renals: No right renal artery identified. Left renal artery with minimal atherosclerotic changes at the origin and no significant stenosis. IMA: Inferior mesenteric artery patent. Right lower  extremity: Minimal atherosclerotic changes of the right iliac system. No aneurysm, dissection, stenosis or occlusion. Hypogastric artery is patent. External iliac artery patent. Right common femoral artery patent without atherosclerotic changes. Moderate atherosclerotic changes of the right superficial femoral artery. The timing of the contrast bolus somewhat limits the sensitivity/specificity of the examination. Popliteal artery appears patent, although the fidelity of the exam is decreased at this level. Calcifications of the right tibial arteries as well as the timing of the contrast bolus limits evaluation of the right tibial/pedal arteries for patency. Left lower extremity: Minimal atherosclerotic changes of the left iliac system. No aneurysm, dissection, stenosis or occlusion. Hypogastric artery is patent. External iliac artery patent. Left common femoral artery patent without atherosclerotic changes. Moderate atherosclerotic changes of the left superficial femoral artery. The timing of the contrast bolus somewhat limits the sensitivity/specificity of the examination. Popliteal artery appears patent, although the fidelity of the exam is decreased at this level. Calcifications of the left tibial arteries as well as the timing of the contrast bolus limits evaluation of the tibial/pedal arteries for patency. Veins: Unremarkable appearance of the venous system. Review of the MIP images confirms the above findings. NON-VASCULAR Lower chest: Small pleural effusions with interlobular septal thickening at the lung bases. Cardiomegaly. Hiatal hernia. Hepatobiliary: Unremarkable appearance of the liver. Cholecystectomy Pancreas: Unremarkable pancreas Spleen: Unremarkable spleen Adrenals/Urinary Tract: Unremarkable adrenal glands. Right: Atrophic right kidney with no perfusion. Left: Left kidney without hydronephrosis or nephrolithiasis. Unremarkable course of the left ureter. Partial distention of urinary bladder.  Stomach/Bowel: Hiatal hernia. Otherwise unremarkable stomach. Unremarkable appearance of small bowel. No evidence of obstruction. Colonic diverticula are present. No associated inflammatory changes. Surgical changes at the cecum, likely appendectomy. Lymphatic: Misty mesentery.  No free fluid. Multiple lymph nodes in the para-aortic nodal stations, pronounced at the left sided para-aortic stations adjacent to the kidney Bilateral inguinal lymph nodes borderline enlarged. Right greater than left iliac lymph nodes Mesenteric: No free fluid or air. No adenopathy. Reproductive: Changes of hysterectomy. 6 cm low-density cystic lesion associated with the left adnexa. Other: Small fat containing umbilical hernia. Musculoskeletal: Sclerotic appearance of the bony elements, potentially related to bone density in a patient of this age, or alternatively developing renal osteodystrophy. Degenerative changes of the hips. Benign-appearing sclerotic focus of the left femoral head. No displaced fracture. Edema of the right foot.  No subcutaneous gas. IMPRESSION: Mild aortic atherosclerosis.  Aortic Atherosclerosis (ICD10-I70.0). Minimal iliac atherosclerosis. Moderate bilateral femoropopliteal atherosclerosis and bilateral tibial pedal atherosclerotic changes, with developing medial calcinosis. Unfortunately, a combination of poor contrast bolus timing (probably decreased cardiac output) and the calcified changes decrease the fidelity of the CTA, and limit the sensitivity/specificity for the detection of stenosis/occlusion of the fem-pop and tibial segments. Mild pulmonary edema and mesenteric anasarca, with small pleural effusions and cardiomegaly. Prominent retroperitoneal/periaortic lymph nodes, with additional borderline enlarged lymph nodes of the right greater than left inguinal region and iliac nodal chain. While these are most likely reactive given the patient's history, lymphoproliferative disorder cannot be excluded.  Atrophic right kidney compatible with given history. Low-density cystic lesion of the left adnexa. Given the size and cystic characteristics in this presumably premenopausal patient, referral for Ob GYN evaluation is recommended to initiate annual surveillance, according to established ACR guidelines. Signed, Dulcy Fanny. Dellia Nims, RPVI [facility] Electronically Signed   By: Corrie Mckusick D.O.   On: 06/11/2018 09:27   Mr Foot Right Wo Contrast  Result Date: 06/07/2018 CLINICAL DATA:  Diabetic patient with a wound near the distal fifth metatarsal of the right foot which the patient first noticed in the past month. EXAM: MRI OF THE RIGHT FOREFOOT WITHOUT CONTRAST TECHNIQUE: Multiplanar, multisequence MR imaging of the right forefoot was performed. No intravenous contrast was administered. COMPARISON:  Plain films right foot 06/06/2018. FINDINGS: Bones/Joint/Cartilage Marrow edema is seen in throughout the fifth toe and in the distal 1.2 cm of the fifth metatarsal consistent with osteomyelitis. Bone marrow signal is otherwise unremarkable. No joint effusion. Ligaments Intact.  New Muscles and Tendons No intramuscular fluid collection. There is some atrophy of intrinsic musculature the foot. Soft tissues Subcutaneous edema is present about the foot and worst dorsally. Skin wound is seen at approximately the level of the fifth MTP joint. No abscess is identified. IMPRESSION: Findings consistent with osteomyelitis in the distal 1.2 cm of the fifth metatarsal and throughout the fifth toe. Skin wound adjacent to the head of the fifth metatarsal is identified. Negative for abscess or septic joint. Diffuse subcutaneous edema about the foot consistent with dependent change and/or cellulitis. Electronically Signed   By: Inge Rise M.D.   On: 06/07/2018 14:22   Dg Foot Complete Right  Result Date: 06/07/2018 CLINICAL DATA:  Ulcer on the bottom of the right  foot at the head of the metatarsal EXAM: RIGHT FOOT COMPLETE - 3+ VIEW COMPARISON:  05/29/2018 FINDINGS: No fracture or malalignment. No periostitis or bone destruction. Vascular calcifications in the soft tissues. No soft tissue gas IMPRESSION: No acute osseous abnormality Electronically Signed   By: Donavan Foil M.D.   On: 06/07/2018 00:08   Dg Foot Complete Right  Result Date: 05/29/2018 Please see detailed radiograph report in office note.   DISCHARGE EXAMINATION: See progress note from earlier today.  DISPOSITION: home  Discharge Instructions    Call MD for:  difficulty breathing, headache or visual disturbances   Complete by:  As directed    Call MD for:  extreme fatigue   Complete by:  As directed    Call MD for:  persistant dizziness or light-headedness   Complete by:  As directed    Call MD for:  persistant nausea and vomiting   Complete by:  As directed    Call MD for:  redness, tenderness, or signs of infection (pain, swelling, redness, odor or green/yellow discharge around incision site)   Complete by:  As  directed    Call MD for:  severe uncontrolled pain   Complete by:  As directed    Call MD for:  temperature >100.4   Complete by:  As directed    Diet Carb Modified   Complete by:  As directed    Discharge instructions   Complete by:  As directed    You were advised to stay till 7//13 due to need for 1 more day of IV antibiotics. But you were adamant about leaving today. Hence you are being discharged in a less than ideal circumstance. Please call Dr. Nicole Cella office for any questions. Please go for dialysis as per usual schedule.  You were cared for by a hospitalist during your hospital stay. If you have any questions about your discharge medications or the care you received while you were in the hospital after you are discharged, you can call the unit and asked to speak with the hospitalist on call if the hospitalist that took care of you is not available. Once you  are discharged, your primary care physician will handle any further medical issues. Please note that NO REFILLS for any discharge medications will be authorized once you are discharged, as it is imperative that you return to your primary care physician (or establish a relationship with a primary care physician if you do not have one) for your aftercare needs so that they can reassess your need for medications and monitor your lab values. If you do not have a primary care physician, you can call [phone] for a physician referral.   Increase activity slowly   Complete by:  As directed         Allergies as of 06/12/2018      Reactions   Heparin Other (See Comments)   Patient relates vitreous hemorrhage after heparin.      Medication List    STOP taking these medications   ciprofloxacin 250 MG tablet Commonly known as:  CIPRO   clindamycin 150 MG capsule Commonly known as:  CLEOCIN   silver sulfADIAZINE 1 % cream Commonly known as:  SILVADENE     TAKE these medications   amLODipine 10 MG tablet Commonly known as:  NORVASC Take 1 tablet (10 mg total) by mouth daily.   ARTIFICIAL TEARS 1.4 % ophthalmic solution Generic drug:  polyvinyl alcohol Place 1 drop into both eyes 2 (two) times daily.   calcium acetate 667 MG capsule Commonly known as:  PHOSLO Take 1,334 mg by mouth 3 (three) times daily with meals.   collagenase ointment Commonly known as:  SANTYL Apply 1 application topically daily. What changed:  additional instructions   diphenhydrAMINE 25 MG tablet Commonly known as:  BENADRYL Take 25 mg by mouth daily as needed (seasonal allergies).   doxycycline 100 MG tablet Commonly known as:  VIBRA-TABS Take 1 tablet (100 mg total) by mouth 2 (two) times daily for 7 days.   famotidine 20 MG tablet Commonly known as:  PEPCID Take 20 mg by mouth See admin instructions. Take one tablet (20 mg) by mouth every morning, take an additional tablet (20 mg) at night if needed  for heartburn   gabapentin 100 MG capsule Commonly known as:  NEURONTIN Take 100 mg by mouth 3 (three) times daily.   hydrALAZINE 100 MG tablet Commonly known as:  APRESOLINE Take 100 mg by mouth every 8 (eight) hours.   labetalol 200 MG tablet Commonly known as:  NORMODYNE Take 400 mg by mouth every 8 (eight) hours.  LANTUS SOLOSTAR 100 UNIT/ML Solostar Pen Generic drug:  Insulin Glargine Inject 10 Units into the skin at bedtime.   LORazepam 0.5 MG tablet Commonly known as:  ATIVAN Take 0.5 mg by mouth See admin instructions. Take one tablet (0.5 mg) by mouth 30 minutes prior dialysis on Monday, Wednesday, Friday   losartan 100 MG tablet Commonly known as:  COZAAR Take 1 tablet (100 mg total) by mouth daily.   NOVOLOG FLEXPEN 100 UNIT/ML FlexPen Generic drug:  insulin aspart Inject 2-6 Units into the skin 3 (three) times daily as needed for high blood sugar (CBG>100).   RENA-VITE PO Take 1 tablet by mouth daily.   saccharomyces boulardii 250 MG capsule Commonly known as:  FLORASTOR Take 1 capsule (250 mg total) by mouth 2 (two) times daily for 14 days.   VITAMIN D-1000 MAX ST 1000 units tablet Generic drug:  Cholecalciferol Take 1,000 Units by mouth daily.            Durable Medical Equipment  (From admission, onward)        Start     Ordered   06/12/18 1452  For home use only DME Other see comment  Once    Comments:  35 inch transfer board   06/12/18 1452       Follow-up Information    Angelia Mould, MD. Schedule an appointment as soon as possible for a visit in 1 week(s).   Specialties:  Vascular Surgery, Cardiology Contact information: [address] [place] [address] [phone]           TOTAL DISCHARGE TIME: 35 mins  Bonnielee Haff  [facility] Pager [phone]  06/12/2018, 6:11 PM

## 2018-06-12 NOTE — Progress Notes (Addendum)
[facility] PROGRESS NOTE  Karen [name] NTZ:[id number] DOB: [birth year] DOA: 06/06/2018  PCP: Wynn Banker, PA  Brief History/Interval Summary: 42 year old female with a past medical history of insulin-dependent diabetes mellitus since the age of 71, end-stage renal disease on hemodialysis for the past 1 year, Hypertension, diabetic foot ulcer presented with fever and redness and swelling around her wound.  She was recently seen by podiatry as an outpatient and was placed on ciprofloxacin and clindamycin.  She completed the course of these antibiotics.  She was admitted for further management.  She was seen by vascular surgery and underwent angiogram.  She underwent amputation of the right fifth toe.  Reason for Visit: Osteomyelitis of the right fifth toe  Consultants: Vascular surgery.  Nephrology.  Procedures:  Hemodialysis.    Lower extremity angiogram The aorta and iliac segments are small but there is no flow-limiting stenosis.  On the left leg she has run off which is very quick at least via the anterior tibial artery.  On the right side she possibly has a non-flow-limiting above the knee popliteal artery stenosis.  The anterior tibial runoff to the foot gives off the DP.  Posterior tibial artery is initially stenotic at approximately 70% but terminates at the ankle.  No intervention was undertaken.  7/11: 1.  Ray amputation of right fifth toe 2.  Placement of VAC   Antibiotics: On vancomycin, ceftriaxone and Flagyl  Subjective/Interval History: Patient seen in dialysis.  She is doing well.  She is asking about going home.  I have explained to her that this will be as per vascular surgery.  Denies any pain issues.  No other complaints.  ROS: Denies any shortness of breath.  Objective:  Vital Signs  Vitals:   06/12/18 0930 06/12/18 1000 06/12/18 1030 06/12/18 1058  BP: (!) 164/80 (!) 141/73 (!) 164/81   Pulse: 86 83 87   Resp:      Temp:    98.9  F (37.2 C)  TempSrc:    Oral  SpO2:      Weight:      Height:        Intake/Output Summary (Last 24 hours) at 06/12/2018 1113 Last data filed at 06/12/2018 1058 Gross per 24 hour  Intake 1298 ml  Output 2014 ml  Net -716 ml   Filed Weights   06/11/18 2308 06/12/18 0539 06/12/18 0654  Weight: 88.1 kg (194 lb 3.6 oz) 86.9 kg (191 lb 9.3 oz) 84.1 kg (185 lb 6.5 oz)    General appearance: Awake alert.  In no distress Resp: Clear to auscultation bilaterally.  No wheezing rales or rhonchi Cardio: S1-S2 is normal regular.  No S3-S4.  No rubs murmurs or bruit GI: Abdomen is soft.  Nontender nondistended.  Bowel sounds present.  No masses organomegaly Extremities: Right foot covered in dressing Neurologic: No obvious focal neurological deficits.  Lab Results:  Data Reviewed: I have personally reviewed following labs and imaging studies  CBC: Recent Labs  Lab 06/06/18 2156 06/07/18 0227 06/07/18 2206 06/09/18 0842 06/12/18 0326  WBC 15.4* 13.5* 11.0* 10.1 8.8  NEUTROABS 11.7* 9.5*  --   --   --   HGB 10.6* 9.7* 9.6* 9.5* 9.2*  HCT 34.9* 30.8* 30.6* 30.8* 30.3*  MCV 95.9 93.6 93.9 93.3 95.0  PLT 269 240 245 247 675    Basic Metabolic Panel: Recent Labs  Lab 06/07/18 0227 06/07/18 2206 06/09/18 0842 06/10/18 0338 06/12/18 0326  NA 136 132* 138 135 136  K 4.8 5.5* 4.4 4.7 4.6  CL 98 96* 98 96* 97*  CO2 27 24 25 24 26   GLUCOSE 211* 243* 116* 205* 298*  BUN 39* 52* 47* 64* 49*  CREATININE 7.26* 8.69* 7.91* 9.06* 7.42*  CALCIUM 9.0 9.1 8.9 9.0 9.2  PHOS  --  5.4*  --  5.5*  --     GFR: Estimated Creatinine Clearance: 10.7 mL/min (A) (by C-G formula based on SCr of 7.42 mg/dL (H)).  Liver Function Tests: Recent Labs  Lab 06/06/18 2156 06/07/18 2206 06/10/18 0338  AST 17  --   --   ALT 15  --   --   ALKPHOS 157*  --   --   BILITOT 0.8  --   --   PROT 6.7  --   --   ALBUMIN 3.2* 2.8* 2.7*     CBG: Recent Labs  Lab 06/11/18 1152 06/11/18 1538  06/11/18 2008 06/12/18 0622 06/12/18 0914  GLUCAP 166* 356* 330* 278* 131*     Recent Results (from the past 240 hour(s))  Blood Cultures x 2 sites     Status: None (Preliminary result)   Collection Time: 06/07/18  2:20 AM  Result Value Ref Range Status   Specimen Description BLOOD RIGHT HAND  Final   Special Requests   Final    BOTTLES DRAWN AEROBIC AND ANAEROBIC Blood Culture adequate volume   Culture   Final    NO GROWTH 4 DAYS Performed at [facility], [place] [address] [place], [place] [address]    Report Status PENDING  Incomplete  Blood Cultures x 2 sites     Status: None (Preliminary result)   Collection Time: 06/07/18  2:30 AM  Result Value Ref Range Status   Specimen Description BLOOD RIGHT HAND  Final   Special Requests   Final    BOTTLES DRAWN AEROBIC AND ANAEROBIC Blood Culture adequate volume   Culture   Final    NO GROWTH 4 DAYS Performed at [facility], [place] [address], [place], [place] [address]    Report Status PENDING  Incomplete  Surgical pcr screen     Status: Abnormal   Collection Time: 06/08/18  6:25 AM  Result Value Ref Range Status   MRSA, PCR POSITIVE (A) NEGATIVE Final    Comment: RESULT CALLED TO, READ BACK BY AND VERIFIED WITH: B. Sloop RN 9:40 06/08/18 ([email or web address])    Staphylococcus aureus POSITIVE (A) NEGATIVE Final    Comment: (NOTE) The Xpert SA Assay (FDA approved for NASAL specimens in patients 28 years of age and older), is one component of a comprehensive surveillance program. It is not intended to diagnose infection nor to guide or monitor treatment. Performed at [facility], [place] [address] [place], [place] [address]   Aerobic/Anaerobic Culture (surgical/deep wound)     Status: None (Preliminary result)   Collection Time: 06/11/18  7:57 AM  Result Value Ref Range Status   Specimen Description WOUND  Final   Special Requests NONE  Final   Gram Stain   Final    NO WBC SEEN NO ORGANISMS SEEN Performed at [facility], [place] [address] [place], [place] [address]    Culture PENDING  Incomplete   Report Status PENDING  Incomplete      Radiology Studies: No results found.   Medications:  Scheduled: . amLODipine  10 mg Oral Daily  . calcium acetate  1,334 mg Oral TID WC  . Chlorhexidine  Gluconate Cloth  6 each Topical [id number]  . collagenase   Topical Daily  . darbepoetin (ARANESP) injection - DIALYSIS  40 mcg Intravenous Q Fri-HD  . doxercalciferol  5 mcg Intravenous Q M,W,F-HD  . famotidine  20 mg Oral Daily  . gabapentin  100 mg Oral TID  . hydrALAZINE  100 mg Oral Q8H  . insulin aspart  0-5 Units Subcutaneous QHS  . insulin aspart  0-9 Units Subcutaneous TID WC  . insulin glargine  10 Units Subcutaneous QHS  . labetalol  200 mg Oral BID  . LORazepam  0.5 mg Oral Q M,W,F-HD  . losartan  100 mg Oral Daily  . multivitamin  1 tablet Oral QHS  . mupirocin ointment  1 application Nasal BID  . nutrition supplement (JUVEN)  1 packet Oral BID BM  . polyvinyl alcohol  1 drop Both Eyes BID  . saccharomyces boulardii  250 mg Oral BID  . sodium chloride flush  3 mL Intravenous Q12H   Continuous: . sodium chloride    . sodium chloride    . sodium chloride    . sodium chloride    . sodium chloride    . cefTRIAXone (ROCEPHIN)  IV Stopped (06/12/18 0156)  . metronidazole Stopped (06/12/18 0319)  . 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  Underwent  angiogram which suggested adequate circulation.  No intervention was performed.  Patient underwent amputation of the fifth toe on 7/11.  Wound VAC in place.  Patient remains on vancomycin and ceftriaxone and Flagyl.  Vascular surgery recommends continuing antibiotics through today and then changing to oral tomorrow.  We will plan on changing to doxycycline and Keflex.  Cultures from the amputation site still pending.  PT evaluation.  Patient has a wound VAC which will need home health for management.    Essential hypertension Blood pressure is stable.  Continue home medications.  End-stage renal disease on hemodialysis on Monday Wednesday and Friday.  Nephrology is following.    Insulin-dependent diabetes mellitus CBGs reasonably well controlled. Continue Lantus.  DVT Prophylaxis: SCDs.     Code Status: Full code Family Communication: Discussed with the patient Disposition Plan: Management as outlined above.  She will need to be seen by physical therapy.  She needs wound VAC at home.  Case management consulted.  Change to oral antibiotics tomorrow.  Anticipate discharge tomorrow.    LOS: 5 days   [facility] Pager [phone] 06/12/2018, 11:13 AM  If 7PM-7AM, please contact night-coverage at [email or web address], password [facility]

## 2018-06-12 NOTE — Care Management Note (Signed)
Case Management Note Marvetta Gibbons RN, BSN Unit 4E-Case Manager [phone]  Patient Details  Name: Karen [name] MRN: [id number] Date of Birth: 29-Oct-1976  Subjective/Objective:  Pt admitted with osteomyelitis, s/p ray amputation of the right fifth toe with placement of a VAC                  Action/Plan: PTA pt lived at home, pt was active with [facility] for [facility]- will need HHPT added to resumption orders- noted pt will need home wound VAC for discharge- have asked vascular to come sign [facility] home wound VAC form- spoke with Butch Penny at [facility] for home wound VAC needs- once approved will come to bedside to place home VAC on pt. Per PT- pt will also need transfer board [facility] does not carry size needed- order printed and given to pt to take to [facility] store to pick up. CM has notified Tiffany with [facility] of discharge and home VAC drsg change needs- pt to d/c home later today once VAC arrives to bedside- have spoken to pt at the bedside regarding all of the above.   Expected Discharge Date:  (TBD)               Expected Discharge Plan:  [place]  In-House Referral:     Discharge planning Services  CM Consult  Post Acute Care Choice:  Home Health, Resumption of Svcs/PTA Provider, Durable Medical Equipment Choice offered to:  Patient  DME Arranged:  Other see comment DME Agency:  [facility] Arranged:  RN, PT [facility] Agency:  [facility]  Status of Service:  Completed, signed off  If discussed at [place] of Stay Meetings, dates discussed:   Discharge Disposition: home/home health    Additional Comments:  Dawayne Patricia, RN 06/12/2018, 5:13 PM

## 2018-06-12 NOTE — Progress Notes (Addendum)
[facility] Progress Note   Subjective: no c/o's, is on HD  Objective Vitals:   06/12/18 1000 06/12/18 1030 06/12/18 1058 06/12/18 1143  BP: (!) 141/73 (!) 164/81 (P) 134/65 128/60  Pulse: 83 87 (P) 87 85  Resp:   (P) 18 18  Temp:   98.9 F (37.2 C) 98.6 F (37 C)  TempSrc:   Oral Oral  SpO2:   (P) 96% 93%  Weight:   (P) 82.1 kg (181 lb)   Height:       Physical Exam General: WNWD female NAD  Heart: RRR Lungs: CTAB Abdomen: soft NT/ND Extremities: R foot bandaged wound vac in place. No LE edema  Dialysis Access: LUE AVF +bruit   Dialysis: AF MWF  3h 12min   400/600  82kg  2/2.5 bath LUA AVF  Hep 2300 (pt refusing due to vitreous hemorrhage) - venofer 50 /wk - hect 5 - aranesp 40 last 7/5 - recent Hb 10.3 tsat 13%  ferr 528  pth 201  P 6      Impression: 1 R foot ulcer/ osteomyelitis: on IV abx per primary team.  SP R 5th toe amputation per Dr.  Scot Dock on 7/11 2 ESRD on HD MWF. Next HD 7/12.  3  Volume - down to dry wt. Post HD weight on 7/10  81.3kg  4  DM severe complications 5  Anemia ckd - hb 9.6, gets darbe 40 q Friday  6  Hx MRSA endocarditis 7  HTN - severe, controlled, on 4 bp meds high dose 8  Chronic back pain - disabled from this and other issues, mostly nonamb per notes 9  Dispo - dc awaiting rec's from [facility], appears will be here until tomorrow    Kelly Splinter MD [place] pager [phone]   06/12/2018, 12:13 PM     Additional Objective Labs: Basic Metabolic Panel: Recent Labs  Lab 06/07/18 2206 06/09/18 0842 06/10/18 0338 06/12/18 0326  NA 132* 138 135 136  K 5.5* 4.4 4.7 4.6  CL 96* 98 96* 97*  CO2 24 25 24 26   GLUCOSE 243* 116* 205* 298*  BUN 52* 47* 64* 49*  CREATININE 8.69* 7.91* 9.06* 7.42*  CALCIUM 9.1 8.9 9.0 9.2  PHOS 5.4*  --  5.5*  --    CBC: Recent Labs  Lab 06/06/18 2156 06/07/18 0227 06/07/18 2206 06/09/18 0842 06/12/18 0326  WBC 15.4* 13.5* 11.0* 10.1 8.8  NEUTROABS 11.7*  9.5*  --   --   --   HGB 10.6* 9.7* 9.6* 9.5* 9.2*  HCT 34.9* 30.8* 30.6* 30.8* 30.3*  MCV 95.9 93.6 93.9 93.3 95.0  PLT 269 240 245 247 279   Blood Culture    Component Value Date/Time   SDES WOUND 06/11/2018 0757   SPECREQUEST NONE 06/11/2018 0757   CULT  06/11/2018 0757    RARE STAPHYLOCOCCUS AUREUS SUSCEPTIBILITIES TO FOLLOW Performed at [facility], [address], [place], [place] [address]    REPTSTATUS PENDING 06/11/2018 0757    Cardiac Enzymes: No results for input(s): CKTOTAL, CKMB, CKMBINDEX, TROPONINI in the last 168 hours. CBG: Recent Labs  Lab 06/11/18 1538 06/11/18 2008 06/12/18 0622 06/12/18 0914 06/12/18 1130  GLUCAP 356* 330* 278* 131* 212*   Iron Studies: No results for input(s): IRON, TIBC, TRANSFERRIN, FERRITIN in the last 72 hours. No results found for: INR, PROTIME Medications: . sodium chloride 10 mL/hr at 06/12/18 1212  . sodium chloride    . sodium chloride    .  cefTRIAXone (ROCEPHIN)  IV Stopped (06/12/18 0156)  . metronidazole Stopped (06/12/18 0319)  . vancomycin 1,000 mg (06/12/18 1031)   . amLODipine  10 mg Oral Daily  . calcium acetate  1,334 mg Oral TID WC  . Chlorhexidine Gluconate Cloth  6 each Topical Q0600  . collagenase   Topical Daily  . darbepoetin (ARANESP) injection - DIALYSIS  40 mcg Intravenous Q Fri-HD  . doxercalciferol  5 mcg Intravenous Q M,W,F-HD  . famotidine  20 mg Oral Daily  . gabapentin  100 mg Oral TID  . hydrALAZINE  100 mg Oral Q8H  . insulin aspart  0-5 Units Subcutaneous QHS  . insulin aspart  0-9 Units Subcutaneous TID WC  . insulin glargine  10 Units Subcutaneous QHS  . labetalol  200 mg Oral BID  . LORazepam  0.5 mg Oral Q M,W,F-HD  . losartan  100 mg Oral Daily  . multivitamin  1 tablet Oral QHS  . mupirocin ointment  1 application Nasal BID  . nutrition supplement (JUVEN)  1 packet Oral BID BM  . polyvinyl alcohol  1 drop Both Eyes BID  . saccharomyces boulardii  250 mg Oral BID  .  sodium chloride flush  3 mL Intravenous Q12H

## 2018-06-12 NOTE — Evaluation (Signed)
Physical Therapy Evaluation Patient Details Name: Karen [name] MRN: [id number] DOB: 28-Jun-1976 Today's Date: 06/12/2018   History of Present Illness  Pt is a 42 y.o. F with significant PMH of insulin-dependent diabetes mellitus since age 89, end stage renal disease on hemodialysis for the past 1 year, hypertension, diabetic foot ulcer who presented with fever and redness and swelling around wound. Underwent amputation of left fifth toe.   Clinical Impression  Pt admitted with above diagnosis. Patient is independent with ADL's and transfers at baseline; mainly uses a lightweight wheelchair for mobility. Currently requiring close supervision for low pivot transfer to left and min assist for sit to stand using the walker. Recommended low pivots or slideboard transfers versus stand pivots at home for increased safety and independence until weightbearing status changes. Patient presenting with significantly decreased left ankle range of motion; ~10-15 degrees from neutral which adds more difficulty to perform sit to stand transfer. Pt will benefit from skilled PT to increase their independence and safety with mobility to allow discharge to the venue listed below.        Follow Up Recommendations Home health PT;Supervision for mobility/OOB    Equipment Recommendations  Other (comment)(Long slideboard (32 inches))    Recommendations for Other Services       Precautions / Restrictions Precautions Precautions: Fall Restrictions Weight Bearing Restrictions: Yes RLE Weight Bearing: Partial weight bearing Other Position/Activity Restrictions: Partial weightbearing through right heel only with darco shoe      Mobility  Bed Mobility Overal bed mobility: Modified Independent                Transfers Overall transfer level: Needs assistance Equipment used: None;Rolling walker (2 wheeled) Transfers: Sit to/from Stand;Lateral/Scoot Transfers Sit to Stand: Min assist;+2  safety/equipment        Lateral/Scoot Transfers: Supervision General transfer comment: Patient requiring close supervision for lateral scoot transfer to left side with multiple scoots required. Min assist for sit to stand transfer with unsteadiness noted; patient unable to stand > 5 seconds. Maintained NWB on the right foot.  Ambulation/Gait                Stairs            Wheelchair Mobility    Modified Rankin (Stroke Patients Only)       Balance Overall balance assessment: Needs assistance Sitting-balance support: Feet unsupported;Bilateral upper extremity supported Sitting balance-Leahy Scale: Good     Standing balance support: Bilateral upper extremity supported Standing balance-Leahy Scale: Poor                               Pertinent Vitals/Pain Pain Assessment: Faces Faces Pain Scale: Hurts a little bit Pain Location: right foot Pain Descriptors / Indicators: Grimacing Pain Intervention(s): Monitored during session    Home Living Family/patient expects to be discharged to:: Private residence Living Arrangements: Parent;Other relatives(mother and sister) Available Help at Discharge: Family Type of Home: House Home Access: Ramped entrance     Home Layout: Able to live on main level with bedroom/bathroom Home Equipment: [place] - 4 wheels;Wheelchair - manual;Shower seat      Prior Function Level of Independence: Independent with assistive device(s)         Comments: Independent with ADL's. Walks occasionally with Rollator but mainly uses lightweight w/c for mobility. Transfers via a stand pivot typically     Hand Dominance        Extremity/Trunk Assessment  Upper Extremity Assessment Upper Extremity Assessment: Overall WFL for tasks assessed    Lower Extremity Assessment Lower Extremity Assessment: RLE deficits/detail;LLE deficits/detail RLE Deficits / Details: expected post op deficits LLE Deficits / Details: Ankle  dorsiflexion limited ~10-15 degrees from neutral; tight gastrocnemius/soleus. States she typically walks "on her toes" on the left foot        Communication   Communication: No difficulties  Cognition Arousal/Alertness: Awake/alert Behavior During Therapy: WFL for tasks assessed/performed Overall Cognitive Status: Within Functional Limits for tasks assessed                                        General Comments      Exercises     Assessment/Plan    PT Assessment Patient needs continued PT services  PT Problem List Decreased strength;Decreased activity tolerance;Decreased balance;Decreased mobility;Pain;Decreased range of motion       PT Treatment Interventions DME instruction;Functional mobility training;Therapeutic activities;Therapeutic exercise;Balance training;Patient/family education    PT Goals (Current goals can be found in the Care Plan section)  Acute Rehab PT Goals Patient Stated Goal: Be able to transfer PT Goal Formulation: With patient Time For Goal Achievement: 06/26/18 Potential to Achieve Goals: Good    Frequency Min 3X/week   Barriers to discharge        Co-evaluation               AM-PAC PT "6 Clicks" Daily Activity  Outcome Measure Difficulty turning over in bed (including adjusting bedclothes, sheets and blankets)?: None Difficulty moving from lying on back to sitting on the side of the bed? : None Difficulty sitting down on and standing up from a chair with arms (e.g., wheelchair, bedside commode, etc,.)?: Unable Help needed moving to and from a bed to chair (including a wheelchair)?: A Little Help needed walking in hospital room?: A Lot Help needed climbing 3-5 steps with a railing? : Total 6 Click Score: 15    End of Session Equipment Utilized During Treatment: Gait belt Activity Tolerance: Patient tolerated treatment well Patient left: in chair;with call bell/phone within reach Nurse Communication: Mobility  status PT Visit Diagnosis: Other abnormalities of gait and mobility (R26.89);Unsteadiness on feet (R26.81)    Time: 9242-6834 PT Time Calculation (min) (ACUTE ONLY): 24 min   Charges:   PT Evaluation $PT Eval Moderate Complexity: 1 Mod PT Treatments $Therapeutic Activity: 8-22 mins   PT G Codes:        Ellamae Sia, PT, DPT Acute Rehabilitation Services  Pager: [place] 06/12/2018, 2:17 PM

## 2018-06-12 NOTE — Progress Notes (Signed)
Inpatient Diabetes Program Recommendations  AACE/ADA: New Consensus Statement on Inpatient Glycemic Control (2015)  Target Ranges:  Prepandial:   less than 140 mg/dL      Peak postprandial:   less than 180 mg/dL (1-2 hours)      Critically ill patients:  140 - 180 mg/dL   Results for Karen [name], Karen [name] (MRN [id number]) as of 06/12/2018 09:42  Ref. Range 06/11/2018 08:34 06/11/2018 09:49 06/11/2018 11:52 06/11/2018 15:38 06/11/2018 20:08  Glucose-Capillary Latest Ref Range: 70 - 99 mg/dL 121 (H) 117 (H) 166 (H) 356 (H) 330 (H)    Review of Glycemic Control  Diabetes history: DM 2 Outpatient Diabetes medications: Lantus 10 units, Novolog 2-6 units tid with meals Current orders for Inpatient glycemic control: Lantus 10 units qhs, Novolog 0-9 units tid, Novolog 0-5 units qhs  Inpatient Diabetes Program Recommendations:    Glucose trends increases with meals. Patient takes meal coverage at home. Please consider Novolog 2 units tid meal coverage if patient consumes at least 50% of meals and glucose is at least 80 mg/dl.  Thanks,  Tama Headings RN, MSN, BC-ADM Inpatient Diabetes Coordinator Team Pager [phone] (8a-5p)

## 2018-06-16 LAB — AEROBIC/ANAEROBIC CULTURE W GRAM STAIN (SURGICAL/DEEP WOUND)

## 2018-06-16 LAB — AEROBIC/ANAEROBIC CULTURE (SURGICAL/DEEP WOUND): GRAM STAIN: NONE SEEN

## 2018-06-24 ENCOUNTER — Encounter: Payer: Self-pay | Admitting: Vascular Surgery

## 2018-06-24 ENCOUNTER — Ambulatory Visit (INDEPENDENT_AMBULATORY_CARE_PROVIDER_SITE_OTHER): Payer: Self-pay | Admitting: Vascular Surgery

## 2018-06-24 VITALS — BP 135/74 | HR 80 | Temp 98.6°F | Resp 18 | Ht 65.0 in | Wt 181.0 lb

## 2018-06-24 DIAGNOSIS — Z48812 Encounter for surgical aftercare following surgery on the circulatory system: Secondary | ICD-10-CM

## 2018-06-24 NOTE — Progress Notes (Signed)
Patient name: Karen [name] MRN: [id number] DOB: 1976-11-09 Sex: female  REASON FOR VISIT:   Follow-up after amputation of the right fifth toe  HPI:   Karen [name] is a pleasant 42 y.o. 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  Preoperative arteriogram suggested adequate circulation for healing.  She underwent ray amputation of the right fifth toe and placement of a VAC on 06/11/2018.  Since I saw her last, she denies any history of fever or chills.  The VAC is being changed on Monday Wednesday and Friday.  She has no specific complaints.  Current Outpatient Medications  Medication Sig Dispense Refill  . amLODipine (NORVASC) 10 MG tablet Take 1 tablet (10 mg total) by mouth daily. 30 tablet 0  . B Complex-C-Folic Acid (RENA-VITE PO) Take 1 tablet by mouth daily.     . calcium acetate (PHOSLO) 667 MG capsule Take 1,334 mg by mouth 3 (three) times daily with meals.     . Cholecalciferol (VITAMIN D-1000 MAX ST) 1000 units tablet Take 1,000 Units by mouth daily.     . collagenase (SANTYL) ointment Apply 1 application topically daily. (Patient taking differently: Apply 1 application topically daily. Left foot) 15 g 5  . diphenhydrAMINE (BENADRYL) 25 MG tablet Take 25 mg by mouth daily as needed (seasonal allergies).    . famotidine (PEPCID) 20 MG tablet Take 20 mg by mouth See admin instructions. Take one tablet (20 mg) by mouth every morning, take an additional tablet (20 mg) at night if needed for heartburn    . gabapentin (NEURONTIN) 100 MG capsule Take 100 mg by mouth 3 (three) times daily.     . hydrALAZINE (APRESOLINE) 100 MG tablet Take 100 mg by mouth every 8 (eight) hours.     . insulin aspart (NOVOLOG FLEXPEN) 100 UNIT/ML FlexPen Inject 2-6 Units into the skin 3 (three) times daily as needed for high blood sugar (CBG>100).    . Insulin Glargine (LANTUS SOLOSTAR) 100 UNIT/ML Solostar Pen Inject 10 Units into the skin at bedtime.    Marland Kitchen  labetalol (NORMODYNE) 200 MG tablet Take 400 mg by mouth every 8 (eight) hours.     Marland Kitchen LORazepam (ATIVAN) 0.5 MG tablet Take 0.5 mg by mouth See admin instructions. Take one tablet (0.5 mg) by mouth 30 minutes prior dialysis on Monday, Wednesday, Friday    . losartan (COZAAR) 100 MG tablet Take 1 tablet (100 mg total) by mouth daily. 30 tablet 0  . polyvinyl alcohol (ARTIFICIAL TEARS) 1.4 % ophthalmic solution Place 1 drop into both eyes 2 (two) times daily.    Marland Kitchen saccharomyces boulardii (FLORASTOR) 250 MG capsule Take 1 capsule (250 mg total) by mouth 2 (two) times daily for 14 days. 28 capsule 0   No current facility-administered medications for this visit.     REVIEW OF SYSTEMS:  [X]  denotes positive finding, [ ]  denotes negative finding Vascular    Leg swelling    Cardiac    Chest pain or chest pressure:    Shortness of breath upon exertion:    Short of breath when lying flat:    Irregular heart rhythm:    Constitutional    Fever or chills:     PHYSICAL EXAM:   Vitals:   06/24/18 1106  BP: 135/74  Pulse: 80  Resp: 18  Temp: 98.6 F (37 C)  TempSrc: Oral  SpO2: 99%  Weight: 181 lb (82.1 kg)  Height: 5\' 5"  (1.651 m)  GENERAL: The patient is a well-nourished female, in no acute distress. The vital signs are documented above. CARDIOVASCULAR: There is a regular rate and rhythm. PULMONARY: There is good air exchange bilaterally without wheezing or rales. The right foot is warm and well-perfused. The VAC was removed and there is good granulation tissue.  The wound is 45 mm in length, 28 mm in width, and 8 mm in depth.  This has improved from 57/31/12 There is a small amount of exposed bone.  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  The wound is smaller.  She has good granulation tissue.  We will continue with the back and hope that this will help close over the small amount of exposed  bone.  I explained that there is a small chance that this is not successful we would need to take her back to the operating room to remove more bone.  I will see her back in 4 to 6 weeks.  She knows to call sooner if she has problems.  We will try to arrange to have her VAC placed back on today.  Deitra Mayo [facility] [phone]

## 2018-06-25 ENCOUNTER — Telehealth: Payer: Self-pay | Admitting: Podiatry

## 2018-06-25 NOTE — Telephone Encounter (Signed)
This is Karen [name], PT with [facility]. By the patient's request she would like the physical therapy evaluation next week, the week of 29 July. I just wanted to inform you of that. You don't need to call me back but should you need to reach me for some reason, you can at [phone]. Thanks so much. Bye bye.

## 2018-06-30 ENCOUNTER — Ambulatory Visit (INDEPENDENT_AMBULATORY_CARE_PROVIDER_SITE_OTHER): Payer: Medicare Other | Admitting: Physician Assistant

## 2018-06-30 ENCOUNTER — Ambulatory Visit (HOSPITAL_COMMUNITY)
Admission: RE | Admit: 2018-06-30 | Discharge: 2018-06-30 | Disposition: A | Discharge status: Home / Self Care | Payer: Medicare Other | Source: Ambulatory Visit | Attending: Vascular Surgery | Admitting: Vascular Surgery

## 2018-06-30 ENCOUNTER — Other Ambulatory Visit: Payer: Self-pay

## 2018-06-30 ENCOUNTER — Encounter: Payer: Self-pay | Admitting: Physician Assistant

## 2018-06-30 VITALS — BP 135/77 | HR 77 | Temp 99.3°F | Resp 16 | Ht 65.0 in | Wt 180.0 lb

## 2018-06-30 DIAGNOSIS — T82510A Breakdown (mechanical) of surgically created arteriovenous fistula, initial encounter: Secondary | ICD-10-CM

## 2018-06-30 DIAGNOSIS — N186 End stage renal disease: Secondary | ICD-10-CM | POA: Diagnosis not present

## 2018-06-30 DIAGNOSIS — Z992 Dependence on renal dialysis: Secondary | ICD-10-CM

## 2018-06-30 DIAGNOSIS — E11628 Type 2 diabetes mellitus with other skin complications: Secondary | ICD-10-CM

## 2018-06-30 DIAGNOSIS — X58XXXA Exposure to other specified factors, initial encounter: Secondary | ICD-10-CM | POA: Diagnosis not present

## 2018-06-30 DIAGNOSIS — L089 Local infection of the skin and subcutaneous tissue, unspecified: Secondary | ICD-10-CM | POA: Diagnosis not present

## 2018-06-30 NOTE — Progress Notes (Addendum)
Established Dialysis Access   History of Present Illness   Karen [name] is a 42 y.o. ([birth year]) female who presents for re-evaluation of permanent access.  Access appears to be L brachiocephalic fistula but I am unable to find records of surgery in Epic.  She is complaining of pain with cannulation of fistula.  Patient also states that when cannulation is attempted in her mid to upper arm fistula is infiltrated due to the depth of the cephalic vein.  There is an ample segment of her fistula in her distal upper arm that is cannulized without complication.  She denies any skin breakdown or aneurysmal dilation of fistula at this time.  She has heard that other dialysis patients have had their fistula moved closer to the surface and she is here today to inquire about this type of surgery.  She is also status post right fifth toe ray amputation and placement of wound VAC by Dr. Scot Dock on 06/11/2018 and was just seen in office on 06/24/2018.  She is still having home wound care changing her vacuum dressing 3 times a week.  She reports no increase in pain or any other concerns about her amputation site of her right foot.  The patient's PMH, PSH, SH, and FamHx were reviewed and are unchanged from prior visit.  Current Outpatient Medications  Medication Sig Dispense Refill  . amLODipine (NORVASC) 10 MG tablet Take 1 tablet (10 mg total) by mouth daily. 30 tablet 0  . B Complex-C-Folic Acid (RENA-VITE PO) Take 1 tablet by mouth daily.     . calcium acetate (PHOSLO) 667 MG capsule Take 1,334 mg by mouth 3 (three) times daily with meals.     . Cholecalciferol (VITAMIN D-1000 MAX ST) 1000 units tablet Take 1,000 Units by mouth daily.     . collagenase (SANTYL) ointment Apply 1 application topically daily. (Patient taking differently: Apply 1 application topically daily. Left foot) 15 g 5  . diphenhydrAMINE (BENADRYL) 25 MG tablet Take 25 mg by mouth daily as needed (seasonal allergies).    .  famotidine (PEPCID) 20 MG tablet Take 20 mg by mouth See admin instructions. Take one tablet (20 mg) by mouth every morning, take an additional tablet (20 mg) at night if needed for heartburn    . gabapentin (NEURONTIN) 100 MG capsule Take 100 mg by mouth 3 (three) times daily.     . hydrALAZINE (APRESOLINE) 100 MG tablet Take 100 mg by mouth every 8 (eight) hours.     . insulin aspart (NOVOLOG FLEXPEN) 100 UNIT/ML FlexPen Inject 2-6 Units into the skin 3 (three) times daily as needed for high blood sugar (CBG>100).    . Insulin Glargine (LANTUS SOLOSTAR) 100 UNIT/ML Solostar Pen Inject 10 Units into the skin at bedtime.    Marland Kitchen labetalol (NORMODYNE) 200 MG tablet Take 400 mg by mouth every 8 (eight) hours.     Marland Kitchen LORazepam (ATIVAN) 0.5 MG tablet Take 0.5 mg by mouth See admin instructions. Take one tablet (0.5 mg) by mouth 30 minutes prior dialysis on Monday, Wednesday, Friday    . losartan (COZAAR) 100 MG tablet Take 1 tablet (100 mg total) by mouth daily. 30 tablet 0  . polyvinyl alcohol (ARTIFICIAL TEARS) 1.4 % ophthalmic solution Place 1 drop into both eyes 2 (two) times daily.     No current facility-administered medications for this visit.     On ROS today: 10 system ROS is negative unless otherwise noted in HPI   Physical Examination  Vitals:   06/30/18 1448  BP: 135/77  Pulse: 77  Resp: 16  Temp: 99.3 F (37.4 C)  TempSrc: Oral  SpO2: 99%  Weight: 180 lb (81.6 kg)  Height: 5\' 5"  (1.651 m)   Body mass index is 29.95 kg/m.   General Alert, O x 3, WD, NAD  Pulmonary Sym exp, good B air movt,   Cardiac RRR, Nl S1, S2,   Vascular Vessel Right Left  Radial Palpable Palpable  Brachial Palpable Palpable  Ulnar Not palpable Not palpable    Musculo- skeletal  palpable left radial pulse; palpable thrill and audible bruit throughout left upper arm; thrill harder to palpate in mid to upper left upper arm due to depth of fistula; no skin breakdown or pseudoaneurysmal dilation of  fistula at this time Right foot with wound VAC in place with good seal; no surrounding erythema or other sign of infection of right foot; minimal output in wound VAC canister M/S 5/5 throughout  , Extremities without ischemic changes    Neurologic A&O; CN grossly intact     Non-invasive Vascular Imaging   left Arm Access Duplex  (06/30/18):   Diameters:  >0.72 cm throughout upper arm  Depth:  Ranging from 0.23cm in [facility] fossa to 1.12cm in axilla    Medical Decision Making   Karen [name] is a 42 y.o. female who presents with ESRD requiring hemodialysis.    Based on physical exam as well as fistula duplex; the path of left arm brachiocephalic fistula deepens as it approaches the axilla  There however is an ample fistula segment for continued cannulation in distal upper arm  I discussed with the patient that if we were to superficialize this fistula that she Karen require a tunneled dialysis catheter for at least 4 weeks  Patient would like to heal her right fifth toe amputation before considering additional surgery.  She is content with continuing to use her left brachiocephalic fistula in her distal upper arm for the time being and will call/return to office in the future if she would like to further discuss superficialization of fistula  She will continue wound vac dressing changes to R foot with HH and follow up for wound check with Dr. Scot Dock in about 4 weeks.  Dagoberto Ligas PA-C [facility] [place] Office: [phone]

## 2018-07-02 DIAGNOSIS — E441 Mild protein-calorie malnutrition: Secondary | ICD-10-CM | POA: Insufficient documentation

## 2018-07-07 ENCOUNTER — Telehealth: Payer: Self-pay | Admitting: *Deleted

## 2018-07-07 NOTE — Telephone Encounter (Signed)
[name] - [facility] request verbal orders for PT - 1 x wk for 1 wk, and 2 x wk for 1 wk.

## 2018-07-08 NOTE — Telephone Encounter (Signed)
Please approve orders. Thanks!

## 2018-07-08 NOTE — Telephone Encounter (Signed)
Left message informing [name], PT that Dr. March Rummage had approved the recommended PT scheduling.

## 2018-07-13 DIAGNOSIS — L97529 Non-pressure chronic ulcer of other part of left foot with unspecified severity: Secondary | ICD-10-CM | POA: Diagnosis not present

## 2018-07-13 DIAGNOSIS — E11621 Type 2 diabetes mellitus with foot ulcer: Secondary | ICD-10-CM

## 2018-07-22 ENCOUNTER — Ambulatory Visit (INDEPENDENT_AMBULATORY_CARE_PROVIDER_SITE_OTHER): Payer: Self-pay | Admitting: Vascular Surgery

## 2018-07-22 ENCOUNTER — Other Ambulatory Visit: Payer: Self-pay

## 2018-07-22 ENCOUNTER — Encounter: Payer: Self-pay | Admitting: Vascular Surgery

## 2018-07-22 VITALS — BP 155/83 | HR 75 | Temp 98.4°F | Resp 14 | Ht 65.0 in | Wt 180.8 lb

## 2018-07-22 DIAGNOSIS — Z48812 Encounter for surgical aftercare following surgery on the circulatory system: Secondary | ICD-10-CM

## 2018-07-22 NOTE — Progress Notes (Signed)
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  HPI:   Karen [name] is a pleasant 42 y.o. female who had presented with a diabetic foot infection at the fifth metatarsal head on the right foot.  Preoperative arteriogram suggested adequate circulation for healing.  She underwent a ray amputation of the right fifth toe with placement of a VAC on 06/11/2018.  I last saw the patient on 06/24/2018.  When I saw her at that time, the wound was smaller and there was good granulation tissue.  I wanted to continue with the VAC to help close over a small amount of exposed bone.  I plan on seeing her back in 4 to 6 weeks.  She comes in for a follow-up visit.  She has no specific complaints.  She denies fever or chills.  Current Outpatient Medications  Medication Sig Dispense Refill  . amLODipine (NORVASC) 10 MG tablet Take 1 tablet (10 mg total) by mouth daily. 30 tablet 0  . B Complex-C-Folic Acid (RENA-VITE PO) Take 1 tablet by mouth daily.     . calcium acetate (PHOSLO) 667 MG capsule Take 1,334 mg by mouth 3 (three) times daily with meals.     . Cholecalciferol (VITAMIN D-1000 MAX ST) 1000 units tablet Take 1,000 Units by mouth daily.     Marland Kitchen gabapentin (NEURONTIN) 100 MG capsule Take 100 mg by mouth 3 (three) times daily.     . hydrALAZINE (APRESOLINE) 100 MG tablet Take 100 mg by mouth every 8 (eight) hours.     . insulin aspart (NOVOLOG FLEXPEN) 100 UNIT/ML FlexPen Inject 2-6 Units into the skin 3 (three) times daily as needed for high blood sugar (CBG>100).    . Insulin Glargine (LANTUS SOLOSTAR) 100 UNIT/ML Solostar Pen Inject 10 Units into the skin at bedtime.    Marland Kitchen labetalol (NORMODYNE) 200 MG tablet Take 400 mg by mouth every 8 (eight) hours.     Marland Kitchen losartan (COZAAR) 100 MG tablet Take 1 tablet (100 mg total) by mouth daily. 30 tablet 0  . polyvinyl alcohol (ARTIFICIAL TEARS) 1.4 %  ophthalmic solution Place 1 drop into both eyes 2 (two) times daily.    . collagenase (SANTYL) ointment Apply 1 application topically daily. (Patient not taking: Reported on 07/22/2018) 15 g 5  . diphenhydrAMINE (BENADRYL) 25 MG tablet Take 25 mg by mouth daily as needed (seasonal allergies).    . famotidine (PEPCID) 20 MG tablet Take 20 mg by mouth See admin instructions. Take one tablet (20 mg) by mouth every morning, take an additional tablet (20 mg) at night if needed for heartburn    . LORazepam (ATIVAN) 0.5 MG tablet Take 0.5 mg by mouth See admin instructions. Take one tablet (0.5 mg) by mouth 30 minutes prior dialysis on Monday, Wednesday, Friday     No current facility-administered medications for this visit.     REVIEW OF SYSTEMS:  [X]  denotes positive finding, [ ]  denotes negative finding Vascular    Leg swelling    Cardiac    Chest pain or chest pressure:    Shortness of breath upon exertion:    Short of breath when lying flat:    Irregular heart rhythm:    Constitutional    Fever or chills:     PHYSICAL EXAM:   Vitals:   07/22/18 1100 07/22/18 1104  BP: (!) 159/83 (!) 155/83  Pulse: 75 75  Resp:  14   Temp: 98.4 F (36.9 C)   TempSrc: Oral   SpO2: 98%   Weight: 180 lb 12.4 oz (82 kg)   Height: 5\' 5"  (1.651 m)     GENERAL: The patient is a well-nourished female, in no acute distress. The vital signs are documented above. CARDIOVASCULAR: There is a regular rate and rhythm. PULMONARY: There is good air exchange bilaterally without wheezing or rales. She has a brisk biphasic dorsalis pedis signal with the Doppler and a brisk posterior tibial signal also. The wound has continued to decrease in size and I do not see any exposed bone.  This is 42 mm in length, 10 mm in width, and 5 mm in depth.  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  I have recommended at  home health to do daily dressing changes with hydrogel, a moist 2 x 2, and dry gauze.  I will see her back in 4 to 5 weeks.  She knows to call sooner if she has problems.  Deitra Mayo [facility] [phone]

## 2018-07-22 NOTE — Progress Notes (Signed)
Vitals:   07/22/18 1100  BP: (!) 159/83  Pulse: 75  Resp: 14  Temp: 98.4 F (36.9 C)  TempSrc: Oral  SpO2: 98%  Weight: 180 lb 12.4 oz (82 kg)  Height: 5\' 5"  (1.651 m)

## 2018-07-23 ENCOUNTER — Telehealth: Payer: Self-pay | Admitting: Podiatry

## 2018-07-23 NOTE — Telephone Encounter (Signed)
Deneise Lever - [facility] transferred to [place]. I spoke with Pearletha Alfred and she states she called the wrong office.

## 2018-07-23 NOTE — Telephone Encounter (Signed)
This is TEFL teacher, Therapist, sports with [facility]. The pt has told our skilled nurses that she has new wound care orders but we have not received any orders. I was calling to see if I can get you to fax over whatever new orders you have to [place] so we can upload them and apply the orders. The fax number is [phone] and my telephone number is [phone]. You can give me a call back if you have any questions.

## 2018-07-24 ENCOUNTER — Telehealth: Payer: Self-pay | Admitting: Podiatry

## 2018-07-24 NOTE — Telephone Encounter (Signed)
This is Karen [name], PT with [facility]. I'm calling to get verbal orders to continue physical therapy on her for 4 times a week for 1 week and 1 time a week for 1 week. My call back number is [phone]. Thank you. Bye bye.

## 2018-07-24 NOTE — Telephone Encounter (Signed)
Faxed note stating pt is in the care of Dr. Scot Dock - [facility] [phone].

## 2018-07-24 NOTE — Telephone Encounter (Signed)
Unable to contact [name] - [facility].

## 2018-08-05 ENCOUNTER — Ambulatory Visit: Payer: Medicaid Other | Admitting: Vascular Surgery

## 2018-08-12 ENCOUNTER — Ambulatory Visit (INDEPENDENT_AMBULATORY_CARE_PROVIDER_SITE_OTHER): Payer: Medicaid Other | Admitting: Vascular Surgery

## 2018-08-12 ENCOUNTER — Encounter: Payer: Self-pay | Admitting: Vascular Surgery

## 2018-08-12 ENCOUNTER — Other Ambulatory Visit: Payer: Self-pay

## 2018-08-12 VITALS — BP 153/77 | HR 78 | Temp 98.7°F | Resp 18 | Ht 65.0 in | Wt 180.0 lb

## 2018-08-12 DIAGNOSIS — Z48812 Encounter for surgical aftercare following surgery on the circulatory system: Secondary | ICD-10-CM

## 2018-08-12 NOTE — Progress Notes (Signed)
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  HPI:   Karen [name] is a pleasant 42 y.o. female who would presented with a diabetic foot infection at the fifth metatarsal on the right foot.  She underwent an arteriogram which showed adequate circulation for healing.  She underwent a ray amputation of the right fifth toe with placement of a VAC on 06/11/2018.  I last saw the patient on 07/22/2018.  At that time, the wound measured 42 mm in length, 10 mm in width, 5 mm in depth.  I thought it would be reasonable to discontinue the [facility] and have home health do the dressing changes with hydrogel.  She comes in for a follow-up visit.  She has no specific complaints.  Home health has been doing the dressing change.  She denies fever or chills.  Current Outpatient Medications  Medication Sig Dispense Refill  . amLODipine (NORVASC) 10 MG tablet Take 1 tablet (10 mg total) by mouth daily. 30 tablet 0  . B Complex-C-Folic Acid (RENA-VITE PO) Take 1 tablet by mouth daily.     . calcium acetate (PHOSLO) 667 MG capsule Take 1,334 mg by mouth 3 (three) times daily with meals.     . Cholecalciferol (VITAMIN D-1000 MAX ST) 1000 units tablet Take 1,000 Units by mouth daily.     . diphenhydrAMINE (BENADRYL) 25 MG tablet Take 25 mg by mouth daily as needed (seasonal allergies).    . famotidine (PEPCID) 20 MG tablet Take 20 mg by mouth See admin instructions. Take one tablet (20 mg) by mouth every morning, take an additional tablet (20 mg) at night if needed for heartburn    . gabapentin (NEURONTIN) 100 MG capsule Take 100 mg by mouth 3 (three) times daily.     . hydrALAZINE (APRESOLINE) 100 MG tablet Take 100 mg by mouth every 8 (eight) hours.     . insulin aspart (NOVOLOG FLEXPEN) 100 UNIT/ML FlexPen Inject 2-6 Units into the skin 3 (three) times daily as needed for high blood sugar (CBG>100).      . Insulin Glargine (LANTUS SOLOSTAR) 100 UNIT/ML Solostar Pen Inject 10 Units into the skin at bedtime.    Marland Kitchen labetalol (NORMODYNE) 200 MG tablet Take 400 mg by mouth every 8 (eight) hours.     Marland Kitchen LORazepam (ATIVAN) 0.5 MG tablet Take 0.5 mg by mouth See admin instructions. Take one tablet (0.5 mg) by mouth 30 minutes prior dialysis on Monday, Wednesday, Friday    . losartan (COZAAR) 100 MG tablet Take 1 tablet (100 mg total) by mouth daily. 30 tablet 0  . polyvinyl alcohol (ARTIFICIAL TEARS) 1.4 % ophthalmic solution Place 1 drop into both eyes 2 (two) times daily.    . collagenase (SANTYL) ointment Apply 1 application topically daily. (Patient not taking: Reported on 07/22/2018) 15 g 5   No current facility-administered medications for this visit.     REVIEW OF SYSTEMS:  [X]  denotes positive finding, [ ]  denotes negative finding Vascular    Leg swelling    Cardiac    Chest pain or chest pressure:    Shortness of breath upon exertion:    Short of breath when lying flat:    Irregular heart rhythm:    Constitutional    Fever or chills:     PHYSICAL EXAM:   Vitals:   08/12/18 1602  BP: (!) 153/77  Pulse: 78  Resp:  18  Temp: 98.7 F (37.1 C)  TempSrc: Oral  SpO2: 98%  Weight: 180 lb (81.6 kg)  Height: 5\' 5"  (1.651 m)    GENERAL: The patient is a well-nourished female, in no acute distress. The vital signs are documented above. CARDIOVASCULAR: There is a regular rate and rhythm. PULMONARY: There is good air exchange bilaterally without wheezing or rales. The wound has almost completely healed.  There was a fair amount of dried skin which I debrided with a 15 blade.  The foot is warm with brisk Doppler signals in the dorsalis pedis and posterior tibial positions.  DATA:   No new data  MEDICAL ISSUES:   STATUS POST RAY AMPUTATION RIGHT FIFTH TOE: Wound is almost completely healed.  Home health will continue dressing changes for 2 more weeks.  At that point she can just keep  the wound covered with a clean dressing and wear a regular shoe.  I will see her back in 3 months with ABIs.  She knows to call sooner if she has problems.  Deitra Mayo [facility] [phone]

## 2018-08-13 DIAGNOSIS — M545 Low back pain, unspecified: Secondary | ICD-10-CM | POA: Insufficient documentation

## 2018-08-13 DIAGNOSIS — G8929 Other chronic pain: Secondary | ICD-10-CM | POA: Insufficient documentation

## 2018-08-14 ENCOUNTER — Other Ambulatory Visit: Payer: Self-pay

## 2018-08-14 DIAGNOSIS — L089 Local infection of the skin and subcutaneous tissue, unspecified: Principal | ICD-10-CM

## 2018-08-14 DIAGNOSIS — E11628 Type 2 diabetes mellitus with other skin complications: Secondary | ICD-10-CM

## 2018-08-19 DIAGNOSIS — Z23 Encounter for immunization: Secondary | ICD-10-CM | POA: Insufficient documentation

## 2018-09-25 ENCOUNTER — Ambulatory Visit (INDEPENDENT_AMBULATORY_CARE_PROVIDER_SITE_OTHER): Payer: Medicare Other

## 2018-09-25 ENCOUNTER — Ambulatory Visit (INDEPENDENT_AMBULATORY_CARE_PROVIDER_SITE_OTHER): Payer: Medicare Other | Admitting: Podiatry

## 2018-09-25 VITALS — Temp 98.8°F

## 2018-09-25 DIAGNOSIS — L98491 Non-pressure chronic ulcer of skin of other sites limited to breakdown of skin: Secondary | ICD-10-CM

## 2018-09-25 DIAGNOSIS — E11621 Type 2 diabetes mellitus with foot ulcer: Secondary | ICD-10-CM | POA: Diagnosis not present

## 2018-09-25 DIAGNOSIS — I739 Peripheral vascular disease, unspecified: Secondary | ICD-10-CM | POA: Diagnosis not present

## 2018-09-25 DIAGNOSIS — L97529 Non-pressure chronic ulcer of other part of left foot with unspecified severity: Secondary | ICD-10-CM

## 2018-09-28 IMAGING — CT CT ANGIO AOBIFEM WO/W CM
1 of 9 series · 4 of 16 positions shown, 5 images · IV contrast (APPLIED)
Comparison: None.

CLINICAL DATA: 41-year-old female with a history of right foot
infection and multiple cardiovascular risk factors.

EXAM:
CT ANGIOGRAPHY OF ABDOMINAL AORTA WITH ILIOFEMORAL RUNOFF
TECHNIQUE: Multidetector CT imaging of the abdomen, pelvis and lower
extremities was performed using the standard protocol during bolus
administration of intravenous contrast. Multiplanar CT image
reconstructions and MIPs were obtained to evaluate the vascular
anatomy.
CONTRAST:  100mL [id number] IOPAMIDOL ([id number]) INJECTION 76%

[Series 6: angiorunoff 3.0 i30s 3 · axial · 0.82mm/px · z∈[+234,+1078]mm · 4 of 469 slices shown, 5 images]
[im 94/469  soft-tissue]
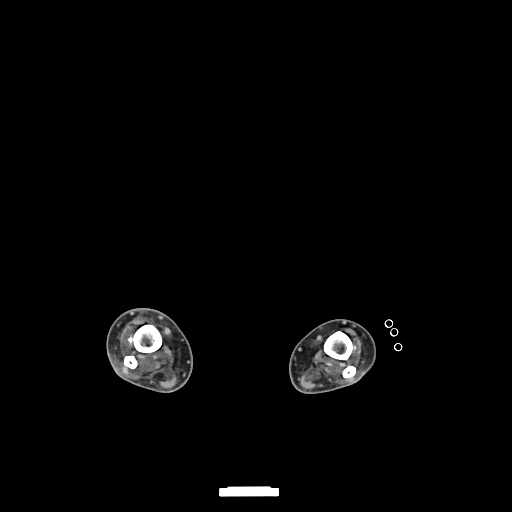
[im 94/469  bone]
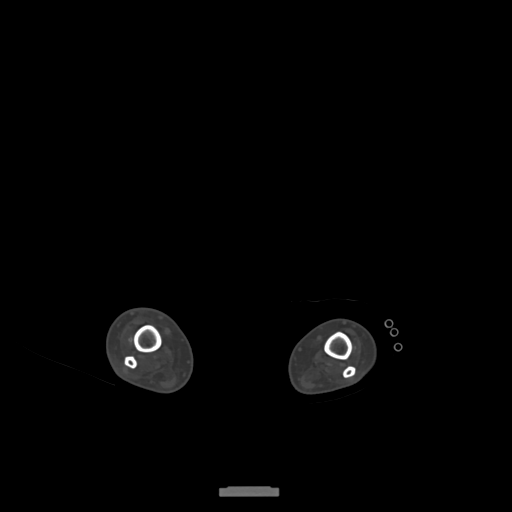
[im 188/469  soft-tissue]
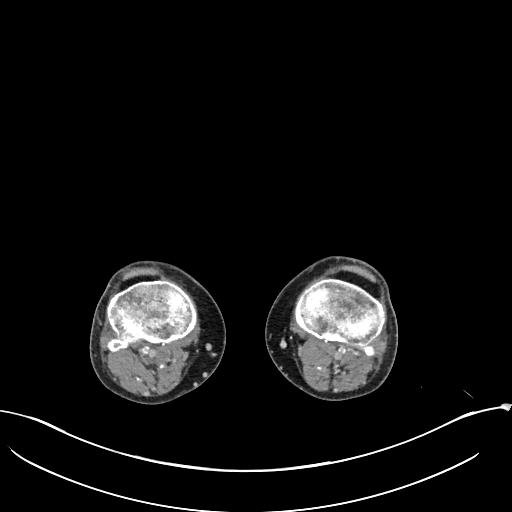
[im 281/469  soft-tissue]
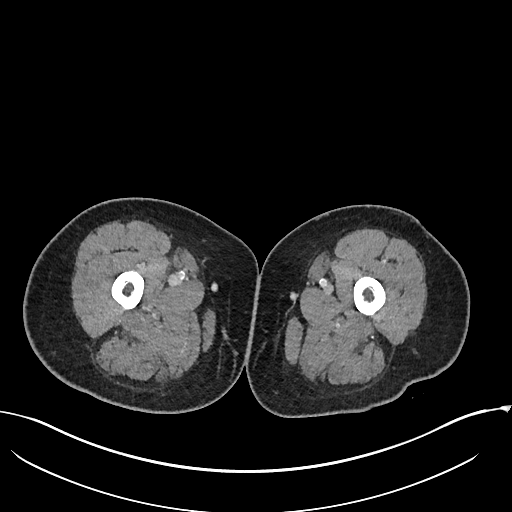
[im 375/469  soft-tissue]
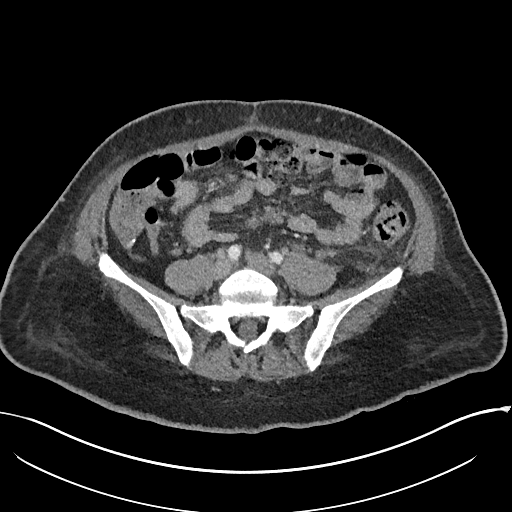

[4 of 16 positions shown; findings below may reference images not displayed]

FINDINGS: VASCULAR

Aorta: No aneurysm or dissection flap. No periaortic fluid. Mild
atherosclerotic changes of the abdominal aorta at the distal aorta.
Diameter of infrarenal aorta at the IMA origin measures 13 mm.

Celiac: Celiac artery patent without atherosclerotic changes at the
origin. Branch vessels are patent.

SMA: Superior mesenteric artery is patent without atherosclerotic
changes at the origin.

Renals: No right renal artery identified. Left renal artery with
minimal atherosclerotic changes at the origin and no significant
stenosis.

IMA: Inferior mesenteric artery patent.

Right lower extremity:

Minimal atherosclerotic changes of the right iliac system. No
aneurysm, dissection, stenosis or occlusion. Hypogastric artery is
patent. External iliac artery patent.

Right common femoral artery patent without atherosclerotic changes.

Moderate atherosclerotic changes of the right superficial femoral
artery. The timing of the contrast bolus somewhat limits the
sensitivity/specificity of the examination.

Popliteal artery appears patent, although the fidelity of the exam
is decreased at this level.

Calcifications of the right tibial arteries as well as the timing of
the contrast bolus limits evaluation of the right tibial/pedal
arteries for patency.

Left lower extremity:

Minimal atherosclerotic changes of the left iliac system. No
aneurysm, dissection, stenosis or occlusion. Hypogastric artery is
patent. External iliac artery patent.

Left common femoral artery patent without atherosclerotic changes.

Moderate atherosclerotic changes of the left superficial femoral
artery. The timing of the contrast bolus somewhat limits the
sensitivity/specificity of the examination.

Popliteal artery appears patent, although the fidelity of the exam
is decreased at this level.

Calcifications of the left tibial arteries as well as the timing of
the contrast bolus limits evaluation of the tibial/pedal arteries
for patency.

Veins: Unremarkable appearance of the venous system.

Review of the MIP images confirms the above findings.

NON-VASCULAR

Lower chest: Small pleural effusions with interlobular septal
thickening at the lung bases. Cardiomegaly. Hiatal hernia.

Hepatobiliary: Unremarkable appearance of the liver. Cholecystectomy

Pancreas: Unremarkable pancreas

Spleen: Unremarkable spleen

Adrenals/Urinary Tract: Unremarkable adrenal glands.

Right:

Atrophic right kidney with no perfusion.

Left:

Left kidney without hydronephrosis or nephrolithiasis. Unremarkable
course of the left ureter.

Partial distention of urinary bladder.

Stomach/Bowel: Hiatal hernia. Otherwise unremarkable stomach.
Unremarkable appearance of small bowel. No evidence of obstruction.
Colonic diverticula are present. No associated inflammatory changes.
Surgical changes at the cecum, likely appendectomy.

Lymphatic: Misty mesentery.  No free fluid.

Multiple lymph nodes in the para-aortic nodal stations, pronounced
at the left sided para-aortic stations adjacent to the kidney

Bilateral inguinal lymph nodes borderline enlarged. Right greater
than left iliac lymph nodes

Mesenteric: No free fluid or air. No adenopathy.

Reproductive: Changes of hysterectomy. 6 cm low-density cystic
lesion associated with the left adnexa.

Other: Small fat containing umbilical hernia.

Musculoskeletal: Sclerotic appearance of the bony elements,
potentially related to bone density in a patient of this age, or
alternatively developing renal osteodystrophy. Degenerative changes
of the hips. Benign-appearing sclerotic focus of the left femoral
head.

No displaced fracture.

Edema of the right foot.  No subcutaneous gas.
IMPRESSION: Mild aortic atherosclerosis.  Aortic Atherosclerosis (PM82G-3I2.2).

Minimal iliac atherosclerosis.

Moderate bilateral femoropopliteal atherosclerosis and bilateral
tibial pedal atherosclerotic changes, with developing medial
calcinosis. Unfortunately, a combination of poor contrast bolus
timing (probably decreased cardiac output) and the calcified changes
decrease the fidelity of the CTA, and limit the
sensitivity/specificity for the detection of stenosis/occlusion of
the fem-pop and tibial segments.

Mild pulmonary edema and mesenteric anasarca, with small pleural
effusions and cardiomegaly.

Prominent retroperitoneal/periaortic lymph nodes, with additional
borderline enlarged lymph nodes of the right greater than left
inguinal region and iliac nodal chain. While these are most likely
reactive given the patient's history, lymphoproliferative disorder
cannot be excluded.

Atrophic right kidney compatible with given history.

Low-density cystic lesion of the left adnexa. Given the size and
cystic characteristics in this presumably premenopausal patient,
referral for Ob GYN evaluation is recommended to initiate annual
surveillance, according to established ACR guidelines.

## 2018-10-02 ENCOUNTER — Ambulatory Visit (INDEPENDENT_AMBULATORY_CARE_PROVIDER_SITE_OTHER): Payer: Medicare Other | Admitting: Podiatry

## 2018-10-02 DIAGNOSIS — E11621 Type 2 diabetes mellitus with foot ulcer: Secondary | ICD-10-CM

## 2018-10-02 DIAGNOSIS — L97529 Non-pressure chronic ulcer of other part of left foot with unspecified severity: Secondary | ICD-10-CM

## 2018-10-04 NOTE — Progress Notes (Signed)
Subjective: 42 year old female presents the office today for follow-up evaluation of wound to the tips of her toes.  Her family mother who is with her states that they have not had any odor to the area and has been putting Silvadene on the wounds daily.  Denies any surrounding redness or red streaks.  No pain.  Person accompanying her today states that they think that her feet swell and her toes rub inside the shoes which started this. Denies any systemic complaints such as fevers, chills, nausea, vomiting. No acute changes since last appointment, and no other complaints at this time.   Objective: AAO x3, NAD DP/PT pulses decreased bilaterally Blisters present to the distal portion of the toes and on the hallux there is starting to be an eschar.  There is no  pus there is no fluctuation or crepitation.  Mild clear drainage but no pus.  There is no ascending cellulitis.  No increase in warmth of the foot.  No fluctuation or crepitation. No pain with calf compression, swelling, warmth, erythema  Assessment: Multiple toe ulceration  Plan: -All treatment options discussed with the patient including all alternatives, risks, complications.  -At this time I do recommend switching to Betadine to keep the area clean and dry.  We did dispense some Betadine swabs for her.  Continue with surgical shoe and open toe to avoid any pressure to the area.  Monitoring signs or symptoms of infection.  Recommend follow-up next week with Dr. March Rummage as scheduled or sooner if any issues are to arise.  It does appear this is more traumatic in nature.  Would we consider also ordering new arterial studies next appointment to rule out emboli however they think this is coming from her shoes as her foot swelled and this is what appears to be as well. -Patient encouraged to call the office with any questions, concerns, change in symptoms.   Trula Slade DPM

## 2018-10-15 ENCOUNTER — Encounter: Payer: Self-pay | Admitting: Podiatry

## 2018-10-15 ENCOUNTER — Ambulatory Visit (INDEPENDENT_AMBULATORY_CARE_PROVIDER_SITE_OTHER): Payer: Medicare Other | Admitting: Podiatry

## 2018-10-15 DIAGNOSIS — E11621 Type 2 diabetes mellitus with foot ulcer: Secondary | ICD-10-CM | POA: Diagnosis not present

## 2018-10-15 DIAGNOSIS — I739 Peripheral vascular disease, unspecified: Secondary | ICD-10-CM

## 2018-10-15 DIAGNOSIS — L98491 Non-pressure chronic ulcer of skin of other sites limited to breakdown of skin: Secondary | ICD-10-CM | POA: Diagnosis not present

## 2018-10-15 DIAGNOSIS — L97529 Non-pressure chronic ulcer of other part of left foot with unspecified severity: Secondary | ICD-10-CM

## 2018-10-22 ENCOUNTER — Ambulatory Visit (INDEPENDENT_AMBULATORY_CARE_PROVIDER_SITE_OTHER): Payer: Medicare Other | Admitting: Podiatry

## 2018-10-22 ENCOUNTER — Telehealth: Payer: Self-pay | Admitting: *Deleted

## 2018-10-22 DIAGNOSIS — I739 Peripheral vascular disease, unspecified: Secondary | ICD-10-CM

## 2018-10-22 DIAGNOSIS — L97529 Non-pressure chronic ulcer of other part of left foot with unspecified severity: Principal | ICD-10-CM

## 2018-10-22 DIAGNOSIS — E1122 Type 2 diabetes mellitus with diabetic chronic kidney disease: Secondary | ICD-10-CM

## 2018-10-22 DIAGNOSIS — E11621 Type 2 diabetes mellitus with foot ulcer: Secondary | ICD-10-CM

## 2018-10-22 DIAGNOSIS — N186 End stage renal disease: Secondary | ICD-10-CM

## 2018-10-22 NOTE — Telephone Encounter (Signed)
Entered in error

## 2018-10-23 ENCOUNTER — Telehealth: Payer: Self-pay | Admitting: *Deleted

## 2018-10-23 NOTE — Telephone Encounter (Signed)
Dr. March Rummage ordered Hydrogel 3oz, ABD 8x10, antimicrobial roll gauze 4", gauze pads 4x4, 2" paper tape, 4" ace bandage, and wound cleanser for daily dressing changes to left hallux unstageable and left 2nd toe 0.5 x 0.5cm, Ell.621, L97.529 faxed to [facility] Attn: Dimas Chyle.

## 2018-10-23 NOTE — Progress Notes (Signed)
Subjective:  Patient ID: Karen [name], female    DOB: [birth year],  MRN: [id number]  Chief Complaint  Patient presents with  . Diabetic Ulcer    multiple toes left foot    42 y.o. female presents for wound care. States the wounds are about the same. Denies new issues.  Review of Systems: Negative except as noted in the HPI. Denies N/V/F/Ch.  Past Medical History:  Diagnosis Date  . Diabetes mellitus without complication ([place])   . Hypertension   . Osteomyelitis ([place])    RIGHT FOOT FIFTH TOE  . Renal disorder     Current Outpatient Medications:  .  diclofenac sodium (VOLTAREN) 1 % GEL, Place onto the skin., Disp: , Rfl:  .  fluticasone (FLONASE) 50 MCG/ACT nasal spray, Place into the nose., Disp: , Rfl:  .  glucagon 1 MG injection, Inject into the skin., Disp: , Rfl:  .  trimethoprim-polymyxin b (POLYTRIM) ophthalmic solution, Place one drop into both eyes every 4 (four) hours for 7 days., Disp: , Rfl:  .  amLODipine (NORVASC) 10 MG tablet, Take 1 tablet (10 mg total) by mouth daily., Disp: 30 tablet, Rfl: 0 .  B Complex-C-Folic Acid (RENA-VITE PO), Take 1 tablet by mouth daily. , Disp: , Rfl:  .  calcium acetate (PHOSLO) 667 MG capsule, Take 1,334 mg by mouth 3 (three) times daily with meals. , Disp: , Rfl:  .  Cholecalciferol (VITAMIN D-1000 MAX ST) 1000 units tablet, Take 1,000 Units by mouth daily. , Disp: , Rfl:  .  collagenase (SANTYL) ointment, Apply 1 application topically daily., Disp: 15 g, Rfl: 5 .  cyclobenzaprine (FLEXERIL) 10 MG tablet, TAKE 1 TABLET BY MOUTH 3 TIMES A DAY AS NEEDED FOR MUSCLE SPASMS FOR 10 DAYS, Disp: , Rfl: 0 .  diclofenac sodium (VOLTAREN) 1 % GEL, PLACE ONTO THE SKIN 4 (FOUR) TIMES A DAY AS NEEDED., Disp: , Rfl: 1 .  diphenhydrAMINE (BENADRYL) 25 MG tablet, Take 25 mg by mouth daily as needed (seasonal allergies)., Disp: , Rfl:  .  doxycycline (VIBRAMYCIN) 100 MG capsule, TAKE ONE CAPSULE (100 MG DOSE) BY MOUTH 2 (TWO) TIMES DAILY FOR 10  DAYS., Disp: , Rfl: 0 .  erythromycin ophthalmic ointment, APPLY SMALL THIN LAYER TO UPPER AND LOWER EYE LIDS., Disp: , Rfl: 0 .  ethyl chloride spray, See admin instructions., Disp: , Rfl: 2 .  famotidine (PEPCID) 20 MG tablet, Take 20 mg by mouth See admin instructions. Take one tablet (20 mg) by mouth every morning, take an additional tablet (20 mg) at night if needed for heartburn, Disp: , Rfl:  .  fluticasone (FLONASE) 50 MCG/ACT nasal spray, SPRAY 1 SPRAY INTO EACH NOSTRIL EVERY DAY, Disp: , Rfl: 2 .  gabapentin (NEURONTIN) 100 MG capsule, Take 100 mg by mouth 3 (three) times daily. , Disp: , Rfl:  .  hydrALAZINE (APRESOLINE) 100 MG tablet, Take 100 mg by mouth every 8 (eight) hours. , Disp: , Rfl:  .  insulin aspart (NOVOLOG FLEXPEN) 100 UNIT/ML FlexPen, Inject 2-6 Units into the skin 3 (three) times daily as needed for high blood sugar (CBG>100)., Disp: , Rfl:  .  Insulin Glargine (LANTUS SOLOSTAR) 100 UNIT/ML Solostar Pen, Inject 10 Units into the skin at bedtime., Disp: , Rfl:  .  labetalol (NORMODYNE) 200 MG tablet, Take 400 mg by mouth every 8 (eight) hours. , Disp: , Rfl:  .  loperamide (IMODIUM) 2 MG capsule, , Disp: , Rfl:  .  LORazepam (ATIVAN) 0.5 MG  tablet, Take 0.5 mg by mouth See admin instructions. Take one tablet (0.5 mg) by mouth 30 minutes prior dialysis on Monday, Wednesday, Friday, Disp: , Rfl:  .  losartan (COZAAR) 100 MG tablet, Take 1 tablet (100 mg total) by mouth daily., Disp: 30 tablet, Rfl: 0 .  Methoxy PEG-Epoetin Beta 100 MCG/0.3ML SOSY, Inject as directed., Disp: , Rfl:  .  mometasone (ELOCON) 0.1 % ointment, , Disp: , Rfl:  .  polyvinyl alcohol (ARTIFICIAL TEARS) 1.4 % ophthalmic solution, Place 1 drop into both eyes 2 (two) times daily., Disp: , Rfl:  .  trimethoprim-polymyxin b (POLYTRIM) ophthalmic solution, PLACE ONE DROP INTO BOTH EYES EVERY 4 (FOUR) HOURS FOR 7 DAYS., Disp: , Rfl: 0  Social History   Tobacco Use  Smoking Status Current Some Day Smoker    . Years: 25.00  . Types: Cigarettes  Smokeless Tobacco Never Used    Allergies  Allergen Reactions  . Heparin Other (See Comments)    Patient relates vitreous hemorrhage after heparin. Patient relates vitreous hemorrhage after heparin.   Objective:  There were no vitals filed for this visit. There is no height or weight on file to calculate BMI. Constitutional Well developed. Well nourished.  Vascular Dorsalis pedis pulses palpable bilaterally. Posterior tibial pulses non-palpable bilaterally. Capillary refill normal to all digits.  No cyanosis or clubbing noted. Pedal hair growth normal.  Neurologic Normal speech. Oriented to person, place, and time. Protective sensation absent  Dermatologic Wounds as below: -L Hallux unstageable eschar -L 2nd toe - 0.5 x 0.5 cm  -L 3rd toe healed.  All wounds without warmth or erythema. So signs of acute infection.  Orthopedic: No pain to palpation either foot.   Radiographs: None today Assessment:   1. 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 PAD (peripheral artery disease) ([place])   3. 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  Ulcers L Foot -Improving, underneath eschar good skin noted. -Dressed with betadine, DSD. -Continue off-loading with surgical shoe.   Return in about 1 week (around 10/29/2018).

## 2018-10-27 ENCOUNTER — Telehealth: Payer: Self-pay | Admitting: Podiatry

## 2018-10-27 NOTE — Telephone Encounter (Signed)
[facility] calling for clarification on wound orders for pt. Has the wound previously been debrided? Also needing clarification on products being requested. Please give them a call.

## 2018-10-27 NOTE — Telephone Encounter (Signed)
[facility] states Hydrogel and ABD pads will not be able to be shipped to pt due to no exudate, and need a date last debrided. I told [facility] representative 05/29/2018 as noted in available clinicals. [facility] representative states will ship what her insurance will cover with the information available, and fax notice. I asked if there was a Vinnie Level at [employer] and they stated no.

## 2018-10-28 ENCOUNTER — Ambulatory Visit (INDEPENDENT_AMBULATORY_CARE_PROVIDER_SITE_OTHER): Payer: Medicare Other | Admitting: Podiatry

## 2018-10-28 DIAGNOSIS — E1122 Type 2 diabetes mellitus with diabetic chronic kidney disease: Secondary | ICD-10-CM | POA: Diagnosis not present

## 2018-10-28 DIAGNOSIS — N186 End stage renal disease: Secondary | ICD-10-CM

## 2018-10-28 DIAGNOSIS — E11621 Type 2 diabetes mellitus with foot ulcer: Secondary | ICD-10-CM | POA: Diagnosis not present

## 2018-10-28 DIAGNOSIS — L97529 Non-pressure chronic ulcer of other part of left foot with unspecified severity: Secondary | ICD-10-CM | POA: Diagnosis not present

## 2018-10-28 DIAGNOSIS — I739 Peripheral vascular disease, unspecified: Secondary | ICD-10-CM

## 2018-10-31 NOTE — Progress Notes (Signed)
Subjective:  Patient ID: Karen [name], female    DOB: 06-Mar-1976,  MRN: [id number]  Chief Complaint  Patient presents with  . Diabetic Ulcer    left toes - 1 week check    42 y.o. female presents for wound care.  Received wound care supplies.  States the wound is doing better.  Review of Systems: Negative except as noted in the HPI. Denies N/V/F/Ch.  Past Medical History:  Diagnosis Date  . Diabetes mellitus without complication ([place])   . Hypertension   . Osteomyelitis ([place])    RIGHT FOOT FIFTH TOE  . Renal disorder     Current Outpatient Medications:  .  amLODipine (NORVASC) 10 MG tablet, Take 1 tablet (10 mg total) by mouth daily., Disp: 30 tablet, Rfl: 0 .  B Complex-C-Folic Acid (RENA-VITE PO), Take 1 tablet by mouth daily. , Disp: , Rfl:  .  calcium acetate (PHOSLO) 667 MG capsule, Take 1,334 mg by mouth 3 (three) times daily with meals. , Disp: , Rfl:  .  Cholecalciferol (VITAMIN D-1000 MAX ST) 1000 units tablet, Take 1,000 Units by mouth daily. , Disp: , Rfl:  .  collagenase (SANTYL) ointment, Apply 1 application topically daily., Disp: 15 g, Rfl: 5 .  cyclobenzaprine (FLEXERIL) 10 MG tablet, TAKE 1 TABLET BY MOUTH 3 TIMES A DAY AS NEEDED FOR MUSCLE SPASMS FOR 10 DAYS, Disp: , Rfl: 0 .  diclofenac sodium (VOLTAREN) 1 % GEL, Place onto the skin., Disp: , Rfl:  .  diclofenac sodium (VOLTAREN) 1 % GEL, PLACE ONTO THE SKIN 4 (FOUR) TIMES A DAY AS NEEDED., Disp: , Rfl: 1 .  diphenhydrAMINE (BENADRYL) 25 MG tablet, Take 25 mg by mouth daily as needed (seasonal allergies)., Disp: , Rfl:  .  doxycycline (VIBRAMYCIN) 100 MG capsule, TAKE ONE CAPSULE (100 MG DOSE) BY MOUTH 2 (TWO) TIMES DAILY FOR 10 DAYS., Disp: , Rfl: 0 .  erythromycin ophthalmic ointment, APPLY SMALL THIN LAYER TO UPPER AND LOWER EYE LIDS., Disp: , Rfl: 0 .  ethyl chloride spray, See admin instructions., Disp: , Rfl: 2 .  famotidine (PEPCID) 20 MG tablet, Take 20 mg by mouth See admin instructions. Take  one tablet (20 mg) by mouth every morning, take an additional tablet (20 mg) at night if needed for heartburn, Disp: , Rfl:  .  fluticasone (FLONASE) 50 MCG/ACT nasal spray, Place into the nose., Disp: , Rfl:  .  fluticasone (FLONASE) 50 MCG/ACT nasal spray, SPRAY 1 SPRAY INTO EACH NOSTRIL EVERY DAY, Disp: , Rfl: 2 .  gabapentin (NEURONTIN) 100 MG capsule, Take 100 mg by mouth 3 (three) times daily. , Disp: , Rfl:  .  glucagon 1 MG injection, Inject into the skin., Disp: , Rfl:  .  hydrALAZINE (APRESOLINE) 100 MG tablet, Take 100 mg by mouth every 8 (eight) hours. , Disp: , Rfl:  .  insulin aspart (NOVOLOG FLEXPEN) 100 UNIT/ML FlexPen, Inject 2-6 Units into the skin 3 (three) times daily as needed for high blood sugar (CBG>100)., Disp: , Rfl:  .  Insulin Glargine (LANTUS SOLOSTAR) 100 UNIT/ML Solostar Pen, Inject 10 Units into the skin at bedtime., Disp: , Rfl:  .  labetalol (NORMODYNE) 200 MG tablet, Take 400 mg by mouth every 8 (eight) hours. , Disp: , Rfl:  .  loperamide (IMODIUM) 2 MG capsule, , Disp: , Rfl:  .  LORazepam (ATIVAN) 0.5 MG tablet, Take 0.5 mg by mouth See admin instructions. Take one tablet (0.5 mg) by mouth 30 minutes prior dialysis  on Monday, Wednesday, Friday, Disp: , Rfl:  .  losartan (COZAAR) 100 MG tablet, Take 1 tablet (100 mg total) by mouth daily., Disp: 30 tablet, Rfl: 0 .  Methoxy PEG-Epoetin Beta 100 MCG/0.3ML SOSY, Inject as directed., Disp: , Rfl:  .  mometasone (ELOCON) 0.1 % ointment, , Disp: , Rfl:  .  polyvinyl alcohol (ARTIFICIAL TEARS) 1.4 % ophthalmic solution, Place 1 drop into both eyes 2 (two) times daily., Disp: , Rfl:  .  trimethoprim-polymyxin b (POLYTRIM) ophthalmic solution, Place one drop into both eyes every 4 (four) hours for 7 days., Disp: , Rfl:  .  trimethoprim-polymyxin b (POLYTRIM) ophthalmic solution, PLACE ONE DROP INTO BOTH EYES EVERY 4 (FOUR) HOURS FOR 7 DAYS., Disp: , Rfl: 0  Social History   Tobacco Use  Smoking Status Current Some  Day Smoker  . Years: 25.00  . Types: Cigarettes  Smokeless Tobacco Never Used    Allergies  Allergen Reactions  . Heparin Other (See Comments)    Patient relates vitreous hemorrhage after heparin. Patient relates vitreous hemorrhage after heparin.   Objective:  There were no vitals filed for this visit. There is no height or weight on file to calculate BMI. Constitutional Well developed. Well nourished.  Vascular Dorsalis pedis pulses palpable bilaterally. Posterior tibial pulses non-palpable bilaterally. Capillary refill normal to all digits.  No cyanosis or clubbing noted. Pedal hair growth normal.  Neurologic Normal speech. Oriented to person, place, and time. Protective sensation absent  Dermatologic Wounds as below: -L Hallux unstageable eschar -L 2nd toe - 0.5 x 0.5 cm  -L 3rd toe healed. All wounds without warmth or erythema. So signs of acute infection.  Orthopedic: No pain to palpation either foot.   Radiographs: None today Assessment:   No diagnosis found. Plan:  Patient was evaluated and treated and all questions answered.  Ulcers L Foot -Additional eschar removed today.  Continues to improve.  Hallux is little bit slower to heal. -Dressed with Betadine wet-to-dry -Follow-up next week for re-eval   No follow-ups on file.

## 2018-11-05 ENCOUNTER — Ambulatory Visit (INDEPENDENT_AMBULATORY_CARE_PROVIDER_SITE_OTHER): Payer: Medicare Other | Admitting: Podiatry

## 2018-11-05 ENCOUNTER — Encounter: Payer: Self-pay | Admitting: Podiatry

## 2018-11-05 DIAGNOSIS — L97521 Non-pressure chronic ulcer of other part of left foot limited to breakdown of skin: Secondary | ICD-10-CM | POA: Diagnosis not present

## 2018-11-05 DIAGNOSIS — E11621 Type 2 diabetes mellitus with foot ulcer: Secondary | ICD-10-CM

## 2018-11-05 NOTE — Progress Notes (Signed)
Subjective:  Patient ID: Karen [name], female    DOB: 1975/12/19,  MRN: [id number]  Chief Complaint  Patient presents with  . Foot Ulcer    left foot toes 1 and 2 follow up; pt stated, "think its looks better; no other concerns"    42 y.o. female presents for wound care.  Thinks the wounds are doing better no new concerns today.  Review of Systems: Negative except as noted in the HPI. Denies N/V/F/Ch.  Past Medical History:  Diagnosis Date  . Diabetes mellitus without complication ([place])   . Hypertension   . Osteomyelitis ([place])    RIGHT FOOT FIFTH TOE  . Renal disorder     Current Outpatient Medications:  .  amLODipine (NORVASC) 10 MG tablet, Take 1 tablet (10 mg total) by mouth daily., Disp: 30 tablet, Rfl: 0 .  B Complex-C-Folic Acid (RENA-VITE PO), Take 1 tablet by mouth daily. , Disp: , Rfl:  .  calcium acetate (PHOSLO) 667 MG capsule, Take 1,334 mg by mouth 3 (three) times daily with meals. , Disp: , Rfl:  .  Cholecalciferol (VITAMIN D-1000 MAX ST) 1000 units tablet, Take 1,000 Units by mouth daily. , Disp: , Rfl:  .  colestipol (COLESTID) 1 g tablet, Take by mouth., Disp: , Rfl:  .  collagenase (SANTYL) ointment, Apply 1 application topically daily., Disp: 15 g, Rfl: 5 .  cyclobenzaprine (FLEXERIL) 10 MG tablet, TAKE 1 TABLET BY MOUTH 3 TIMES A DAY AS NEEDED FOR MUSCLE SPASMS FOR 10 DAYS, Disp: , Rfl: 0 .  diclofenac sodium (VOLTAREN) 1 % GEL, Place onto the skin., Disp: , Rfl:  .  diclofenac sodium (VOLTAREN) 1 % GEL, PLACE ONTO THE SKIN 4 (FOUR) TIMES A DAY AS NEEDED., Disp: , Rfl: 1 .  diphenhydrAMINE (BENADRYL) 25 MG tablet, Take 25 mg by mouth daily as needed (seasonal allergies)., Disp: , Rfl:  .  doxycycline (VIBRAMYCIN) 100 MG capsule, TAKE ONE CAPSULE (100 MG DOSE) BY MOUTH 2 (TWO) TIMES DAILY FOR 10 DAYS., Disp: , Rfl: 0 .  erythromycin ophthalmic ointment, APPLY SMALL THIN LAYER TO UPPER AND LOWER EYE LIDS., Disp: , Rfl: 0 .  ethyl chloride spray, See  admin instructions., Disp: , Rfl: 2 .  famotidine (PEPCID) 20 MG tablet, Take 20 mg by mouth See admin instructions. Take one tablet (20 mg) by mouth every morning, take an additional tablet (20 mg) at night if needed for heartburn, Disp: , Rfl:  .  fluticasone (FLONASE) 50 MCG/ACT nasal spray, Place into the nose., Disp: , Rfl:  .  fluticasone (FLONASE) 50 MCG/ACT nasal spray, SPRAY 1 SPRAY INTO EACH NOSTRIL EVERY DAY, Disp: , Rfl: 2 .  gabapentin (NEURONTIN) 100 MG capsule, Take 100 mg by mouth 3 (three) times daily. , Disp: , Rfl:  .  glucagon 1 MG injection, Inject into the skin., Disp: , Rfl:  .  hydrALAZINE (APRESOLINE) 100 MG tablet, Take 100 mg by mouth every 8 (eight) hours. , Disp: , Rfl:  .  insulin aspart (NOVOLOG FLEXPEN) 100 UNIT/ML FlexPen, Inject 2-6 Units into the skin 3 (three) times daily as needed for high blood sugar (CBG>100)., Disp: , Rfl:  .  Insulin Glargine (LANTUS SOLOSTAR) 100 UNIT/ML Solostar Pen, Inject 10 Units into the skin at bedtime., Disp: , Rfl:  .  labetalol (NORMODYNE) 200 MG tablet, Take 400 mg by mouth every 8 (eight) hours. , Disp: , Rfl:  .  loperamide (IMODIUM) 2 MG capsule, , Disp: , Rfl:  .  LORazepam (ATIVAN) 0.5 MG tablet, Take 0.5 mg by mouth See admin instructions. Take one tablet (0.5 mg) by mouth 30 minutes prior dialysis on Monday, Wednesday, Friday, Disp: , Rfl:  .  losartan (COZAAR) 100 MG tablet, Take 1 tablet (100 mg total) by mouth daily., Disp: 30 tablet, Rfl: 0 .  Methoxy PEG-Epoetin Beta 100 MCG/0.3ML SOSY, Inject as directed., Disp: , Rfl:  .  mometasone (ELOCON) 0.1 % ointment, , Disp: , Rfl:  .  polyvinyl alcohol (ARTIFICIAL TEARS) 1.4 % ophthalmic solution, Place 1 drop into both eyes 2 (two) times daily., Disp: , Rfl:  .  trimethoprim-polymyxin b (POLYTRIM) ophthalmic solution, Place one drop into both eyes every 4 (four) hours for 7 days., Disp: , Rfl:  .  trimethoprim-polymyxin b (POLYTRIM) ophthalmic solution, PLACE ONE DROP INTO  BOTH EYES EVERY 4 (FOUR) HOURS FOR 7 DAYS., Disp: , Rfl: 0  Social History   Tobacco Use  Smoking Status Current Some Day Smoker  . Years: 25.00  . Types: Cigarettes  Smokeless Tobacco Never Used    Allergies  Allergen Reactions  . Heparin Other (See Comments)    Patient relates vitreous hemorrhage after heparin. Patient relates vitreous hemorrhage after heparin.   Objective:  There were no vitals filed for this visit. There is no height or weight on file to calculate BMI. Constitutional Well developed. Well nourished.  Vascular Dorsalis pedis pulses palpable bilaterally. Posterior tibial pulses non-palpable bilaterally. Capillary refill normal to all digits.  No cyanosis or clubbing noted. Pedal hair growth normal.  Neurologic Normal speech. Oriented to person, place, and time. Protective sensation absent  Dermatologic Wounds as below: -L [place] 2x2 eschar -L 2nd toe - 0.5 x 0.5 cm  All wounds without warmth or erythema. So signs of acute infection.  Orthopedic: No pain to palpation either foot.   Radiographs: None today Assessment:   1. 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  Ulcers L Foot -Debridement of both ulcerations with tissue nipper and 312 blade.  Good skin forming underneath the areas of prior ulceration. -Eschar crosshatched with 312 blade.  Advised to alternate Betadine and Silvadene daily  Procedure: Selective Debridement of Wound Rationale: Removal of devitalized tissue from the wound to promote healing.  Pre-Debridement Wound Measurements: 0.5 cm x 0.5 cm x 0.2 cm  Post-Debridement Wound Measurements: same as pre-debridement. Type of Debridement: sharp selective Tissue Removed: Devitalized soft-tissue Dressing: Dry, sterile, compression dressing. Disposition: Patient tolerated procedure well.  Patient to return in 1 week for follow-up.    Return in about 2 weeks (around 11/19/2018) for Wound Care, Left.

## 2018-11-12 ENCOUNTER — Ambulatory Visit: Payer: Medicare Other | Admitting: Podiatry

## 2018-11-18 ENCOUNTER — Ambulatory Visit: Payer: Medicaid Other | Admitting: Vascular Surgery

## 2018-11-18 ENCOUNTER — Encounter (HOSPITAL_COMMUNITY): Payer: Medicaid Other

## 2018-11-19 ENCOUNTER — Encounter (HOSPITAL_COMMUNITY): Payer: Self-pay

## 2018-11-19 ENCOUNTER — Emergency Department (HOSPITAL_COMMUNITY)
Admission: EM | Admit: 2018-11-19 | Discharge: 2018-11-20 | Disposition: A | Discharge status: Home / Self Care | Payer: Medicare Other | Attending: Emergency Medicine | Admitting: Emergency Medicine

## 2018-11-19 ENCOUNTER — Ambulatory Visit: Payer: Medicare Other | Admitting: Podiatry

## 2018-11-19 ENCOUNTER — Other Ambulatory Visit: Payer: Self-pay

## 2018-11-19 ENCOUNTER — Encounter: Payer: Self-pay | Admitting: Vascular Surgery

## 2018-11-19 DIAGNOSIS — J019 Acute sinusitis, unspecified: Secondary | ICD-10-CM | POA: Diagnosis not present

## 2018-11-19 DIAGNOSIS — F17210 Nicotine dependence, cigarettes, uncomplicated: Secondary | ICD-10-CM | POA: Diagnosis not present

## 2018-11-19 DIAGNOSIS — N186 End stage renal disease: Secondary | ICD-10-CM | POA: Insufficient documentation

## 2018-11-19 DIAGNOSIS — Z79899 Other long term (current) drug therapy: Secondary | ICD-10-CM | POA: Diagnosis not present

## 2018-11-19 DIAGNOSIS — Z992 Dependence on renal dialysis: Secondary | ICD-10-CM | POA: Diagnosis not present

## 2018-11-19 DIAGNOSIS — H9202 Otalgia, left ear: Secondary | ICD-10-CM | POA: Insufficient documentation

## 2018-11-19 DIAGNOSIS — I120 Hypertensive chronic kidney disease with stage 5 chronic kidney disease or end stage renal disease: Secondary | ICD-10-CM | POA: Insufficient documentation

## 2018-11-19 NOTE — ED Triage Notes (Signed)
Pt here with left sided ear pain since this morning.  Stated head congestion along with the pain. Took tylenol for the pain to no avail. A&Ox4.

## 2018-11-20 DIAGNOSIS — H9202 Otalgia, left ear: Secondary | ICD-10-CM | POA: Diagnosis not present

## 2018-11-20 MED ORDER — FLUTICASONE PROPIONATE 50 MCG/ACT NA SUSP: 1.0000 | Freq: Every day | NASAL | 0 refills | Status: DC

## 2018-11-20 MED ORDER — OXYCODONE-ACETAMINOPHEN 5-325 MG PO TABS
1.0000 | ORAL_TABLET | Freq: Once | ORAL | Status: AC
  Administered 2018-11-20: 1 via ORAL
  Filled 2018-11-20: qty 1

## 2018-11-20 NOTE — ED Provider Notes (Signed)
[facility] EMERGENCY DEPARTMENT Provider Note   CSN: [id number] Arrival date & time: 11/19/18  2208     History   Chief Complaint Chief Complaint  Patient presents with  . Otalgia    HPI Fontella Shan is a 42 y.o. female.  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  She states that she awoke from sleep this morning with nasal congestion.  She has been experiencing postnasal drip as well as sore throat.  Left-sided otalgia began this afternoon and has persisted despite use of Tylenol which has provided no relief.  She denies any drainage from her ear, fevers, vomiting, inability to swallow, drooling, neck stiffness.  Reports sick contacts at her job.  The history is provided by the patient. No language interpreter was used.  Otalgia     Past Medical History:  Diagnosis Date  . Diabetes mellitus without complication ([place])   . Hypertension   . Osteomyelitis ([place])    RIGHT FOOT FIFTH TOE  . Renal disorder     Patient Active Problem List   Diagnosis Date Noted  . Chronic low back pain without sciatica 08/13/2018  . Diabetic foot infection ([place]) 06/07/2018  . Essential hypertension   . Chronic pancreatitis ([place]) 05/12/2018  . Vitamin D deficiency 05/12/2018  . ESRD on hemodialysis ([place]) 05/07/2018  . History of recurrent UTIs 05/07/2018  . IDDM (insulin dependent diabetes mellitus) ([place]) 05/07/2018  . 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 History of MRSA infection 01/16/2018  . Bilateral pleural effusion 10/10/2017  . 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 ABDOMINAL AORTOGRAM W/LOWER EXTREMITY N/A 06/08/2018   Procedure: ABDOMINAL AORTOGRAM W/LOWER EXTREMITY;  Surgeon: Waynetta Sandy, MD;  Location: [place] [facility] [facility];  Service: Cardiovascular;  Laterality: N/A;  . ABDOMINAL HYSTERECTOMY  09/2016  . AMPUTATION Right 06/11/2018   Procedure: AMPUTATION RIGHT FIFTH TOE;  Surgeon: Angelia Mould, MD;  Location: [place];  Service: Vascular;  Laterality: Right;  . APPENDECTOMY    . CHOLECYSTECTOMY       OB History   No obstetric history on file.      Home Medications    Prior to Admission medications   Medication Sig Start Date End Date Taking? Authorizing Provider  amLODipine (NORVASC) 10 MG tablet Take 1 tablet (10 mg total) by mouth daily. 06/12/18   Bonnielee Haff, MD  B Complex-C-Folic Acid (RENA-VITE PO) Take 1 tablet by mouth daily.     [provider]  calcium acetate (PHOSLO) 667 MG capsule Take 1,334 mg by mouth 3 (three) times daily with meals.     [provider]  Cholecalciferol (VITAMIN D-1000 MAX ST) 1000 units tablet Take 1,000 Units by mouth daily.     [provider]  colestipol (COLESTID) 1 g tablet Take by mouth. 11/05/18   [provider]  collagenase (SANTYL) ointment Apply 1 application topically daily. 05/07/18   Evelina Bucy, DPM  cyclobenzaprine (FLEXERIL) 10 MG tablet TAKE 1 TABLET BY MOUTH 3 TIMES A DAY AS NEEDED FOR MUSCLE SPASMS FOR 10 DAYS 10/07/18   [provider]  diclofenac sodium (VOLTAREN) 1 % GEL Place onto the skin. 10/07/18   [provider]  diclofenac sodium (VOLTAREN) 1 % GEL PLACE ONTO THE SKIN 4 (FOUR) TIMES A DAY AS NEEDED.  10/07/18   [provider]  diphenhydrAMINE (BENADRYL) 25 MG tablet Take 25 mg by mouth daily as needed (seasonal allergies).    [provider]  doxycycline (VIBRAMYCIN) 100 MG capsule TAKE ONE CAPSULE (100 MG DOSE) BY MOUTH 2 (TWO) TIMES DAILY FOR 10 DAYS. 09/25/18   [provider]  erythromycin ophthalmic ointment APPLY SMALL THIN LAYER TO UPPER AND LOWER EYE LIDS. 08/13/18   [provider]  ethyl chloride spray See admin instructions.  09/29/18   [provider]  famotidine (PEPCID) 20 MG tablet Take 20 mg by mouth See admin instructions. Take one tablet (20 mg) by mouth every morning, take an additional tablet (20 mg) at night if needed for heartburn    [provider]  fluticasone (FLONASE) 50 MCG/ACT nasal spray Place 1-2 sprays into both nostrils daily. 11/20/18   Antonietta Breach, PA-C  gabapentin (NEURONTIN) 100 MG capsule Take 100 mg by mouth 3 (three) times daily.  01/26/18   [provider]  glucagon 1 MG injection Inject into the skin. 06/16/18   [provider]  hydrALAZINE (APRESOLINE) 100 MG tablet Take 100 mg by mouth every 8 (eight) hours.  10/24/17   [provider]  insulin aspart (NOVOLOG FLEXPEN) 100 UNIT/ML FlexPen Inject 2-6 Units into the skin 3 (three) times daily as needed for high blood sugar (CBG>100).    [provider]  Insulin Glargine (LANTUS SOLOSTAR) 100 UNIT/ML Solostar Pen Inject 10 Units into the skin at bedtime.    [provider]  labetalol (NORMODYNE) 200 MG tablet Take 400 mg by mouth every 8 (eight) hours.     [provider]  loperamide (IMODIUM) 2 MG capsule  10/15/18   [provider]  LORazepam (ATIVAN) 0.5 MG tablet Take 0.5 mg by mouth See admin instructions. Take one tablet (0.5 mg) by mouth 30 minutes prior dialysis on Monday, Wednesday, Friday 05/12/18   [provider]  losartan (COZAAR) 100 MG tablet Take 1 tablet (100 mg total) by mouth daily. 06/12/18   Bonnielee Haff, MD  Methoxy PEG-Epoetin Beta 100 MCG/0.3ML SOSY Inject as directed.    [provider]  mometasone (ELOCON) 0.1 % ointment  10/14/18   [provider]  polyvinyl alcohol (ARTIFICIAL TEARS) 1.4 % ophthalmic solution Place 1 drop into both eyes 2 (two) times daily.    [provider]  trimethoprim-polymyxin b (POLYTRIM) ophthalmic solution Place one drop into both eyes every 4 (four) hours for 7 days. 09/16/18    [provider]  trimethoprim-polymyxin b (POLYTRIM) ophthalmic solution PLACE ONE DROP INTO BOTH EYES EVERY 4 (FOUR) HOURS FOR 7 DAYS. 09/16/18   [provider]    Family History History reviewed. No pertinent family history.  Social History Social History   Tobacco Use  . Smoking status: Current Some Day Smoker    Years: 25.00    Types: Cigarettes  . Smokeless tobacco: Never Used  Substance Use Topics  . Alcohol use: Not Currently    Frequency: Never  . Drug use: Yes    Types: Marijuana     Allergies   Heparin   Review of Systems Review of Systems  HENT: Positive for ear pain.    Ten systems reviewed and are negative for acute change, except as noted in the HPI.    Physical Exam Updated Vital Signs BP (!) 176/83 (BP Location: Left Arm)   Pulse 85   Temp 98.5 F (36.9 C) (Oral)   Resp 18  SpO2 97%   Physical Exam Vitals signs and nursing note reviewed.  Constitutional:      General: She is not in acute distress.    Appearance: She is well-developed. She is not diaphoretic.     Comments: Nontoxic-appearing and in no distress  HENT:     Head: Normocephalic and atraumatic.     Right Ear: Ear canal and external ear normal.     Left Ear: Ear canal and external ear normal.     Ears:     Comments: Patient with serous middle ear effusions bilaterally without significant bulging or retraction of bilateral TMs.  There is no erythema or injection to tympanic membranes bilaterally.    Nose:     Comments: Audible nasal congestion    Mouth/Throat:     Comments: Uvula midline.  Normal phonation.  No significant posterior oropharyngeal erythema.  No edema or palatal petechiae.  Tolerating secretions without difficulty.  No tripoding or stridor. Eyes:     General: No scleral icterus.    Conjunctiva/sclera: Conjunctivae normal.  Neck:     Musculoskeletal: Normal range of motion.  Pulmonary:     Effort: Pulmonary effort is normal. No respiratory  distress.     Comments: Respirations even and unlabored Musculoskeletal: Normal range of motion.  Skin:    General: Skin is warm and dry.     Coloration: Skin is not pale.     Findings: No erythema or rash.  Neurological:     Mental Status: She is alert and oriented to person, place, and time.  Psychiatric:        Behavior: Behavior normal.      ED Treatments / Results  Labs (all labs ordered are listed, but only abnormal results are displayed) Labs Reviewed - No data to display  EKG None  Radiology No results found.  Procedures Procedures (including critical care time)  Medications Ordered in ED Medications  oxyCODONE-acetaminophen (PERCOCET/ROXICET) 5-325 MG per tablet 1 tablet (has no administration in time range)     Initial Impression / Assessment and Plan / ED Course  I have reviewed the triage vital signs and the nursing notes.  Pertinent labs & imaging results that were available during my care of the patient were reviewed by me and considered in my medical decision making (see chart for details).     42 year old female presents to the ED for complaints of left-sided otalgia secondary to sinusitis.  She is afebrile in the ED without nuchal rigidity or meningismus.  No erythema or injection to bilateral TMs.  No bulging, retraction, perforation.  Exam not consistent with acute otitis media.  Suspect viral etiology of sinusitis at this time.  Will discharge with instruction for saline sinus rinses, Benadryl, Flonase, Tylenol.  Return precautions discussed and provided. Patient discharged in stable condition with no unaddressed concerns.   Final Clinical Impressions(s) / ED Diagnoses   Final diagnoses:  Acute non-recurrent sinusitis, unspecified location  Otalgia of left ear    ED Discharge Orders         Ordered    fluticasone (FLONASE) 50 MCG/ACT nasal spray  Daily     11/20/18 0006           Antonietta Breach, PA-C 11/20/18 0016    Ward, Delice Bison,  DO 11/20/18 2094

## 2018-11-20 NOTE — Discharge Instructions (Addendum)
Your symptoms are consistent with likely viral sinusitis.  We recommend continued use of Tylenol for management of pain.  Use Flonase as prescribed for management of congestion.  You may have some improvement with the use of Benadryl for congestion as well.  You would also benefit from the use of saline sinus rinses.  If your symptoms persist for greater than 5 days or if you develop a fever over 101F, you would benefit from repeat evaluation as you may require treatment with antibiotics.

## 2018-11-26 ENCOUNTER — Ambulatory Visit: Payer: Medicare Other | Admitting: Podiatry

## 2018-11-27 DIAGNOSIS — L299 Pruritus, unspecified: Secondary | ICD-10-CM | POA: Insufficient documentation

## 2018-12-25 DIAGNOSIS — R519 Headache, unspecified: Secondary | ICD-10-CM | POA: Insufficient documentation

## 2018-12-27 NOTE — Progress Notes (Signed)
Subjective:  Patient ID: Oneida Alar, female    DOB: [birth year],  MRN: [id number]  Chief Complaint  Patient presents with  . Wound Check    left foot lateral side, Rt foot discolored toes with blister and ecshar on top of toe, patient states that she noticed it on Sunday, but cannot feel her feet so she doesnt know if there is pain to the area   43 y.o. female presents for wound care. History as above.  Review of Systems: Negative except as noted in the HPI. Denies N/V/F/Ch.  Past Medical History:  Diagnosis Date  . Diabetes mellitus without complication ([place])   . Hypertension   . Osteomyelitis ([place])    RIGHT FOOT FIFTH TOE  . Renal disorder     Current Outpatient Medications:  .  amLODipine (NORVASC) 10 MG tablet, Take 1 tablet (10 mg total) by mouth daily., Disp: 30 tablet, Rfl: 0 .  B Complex-C-Folic Acid (RENA-VITE PO), Take 1 tablet by mouth daily. , Disp: , Rfl:  .  calcium acetate (PHOSLO) 667 MG capsule, Take 1,334 mg by mouth 3 (three) times daily with meals. , Disp: , Rfl:  .  Cholecalciferol (VITAMIN D-1000 MAX ST) 1000 units tablet, Take 1,000 Units by mouth daily. , Disp: , Rfl:  .  colestipol (COLESTID) 1 g tablet, Take by mouth., Disp: , Rfl:  .  collagenase (SANTYL) ointment, Apply 1 application topically daily., Disp: 15 g, Rfl: 5 .  cyclobenzaprine (FLEXERIL) 10 MG tablet, TAKE 1 TABLET BY MOUTH 3 TIMES A DAY AS NEEDED FOR MUSCLE SPASMS FOR 10 DAYS, Disp: , Rfl: 0 .  diclofenac sodium (VOLTAREN) 1 % GEL, Place onto the skin., Disp: , Rfl:  .  diclofenac sodium (VOLTAREN) 1 % GEL, PLACE ONTO THE SKIN 4 (FOUR) TIMES A DAY AS NEEDED., Disp: , Rfl: 1 .  diphenhydrAMINE (BENADRYL) 25 MG tablet, Take 25 mg by mouth daily as needed (seasonal allergies)., Disp: , Rfl:  .  doxycycline (VIBRAMYCIN) 100 MG capsule, TAKE ONE CAPSULE (100 MG DOSE) BY MOUTH 2 (TWO) TIMES DAILY FOR 10 DAYS., Disp: , Rfl: 0 .  erythromycin ophthalmic ointment, APPLY SMALL THIN LAYER TO  UPPER AND LOWER EYE LIDS., Disp: , Rfl: 0 .  ethyl chloride spray, See admin instructions., Disp: , Rfl: 2 .  famotidine (PEPCID) 20 MG tablet, Take 20 mg by mouth See admin instructions. Take one tablet (20 mg) by mouth every morning, take an additional tablet (20 mg) at night if needed for heartburn, Disp: , Rfl:  .  fluticasone (FLONASE) 50 MCG/ACT nasal spray, Place 1-2 sprays into both nostrils daily., Disp: 16 g, Rfl: 0 .  gabapentin (NEURONTIN) 100 MG capsule, Take 100 mg by mouth 3 (three) times daily. , Disp: , Rfl:  .  glucagon 1 MG injection, Inject into the skin., Disp: , Rfl:  .  hydrALAZINE (APRESOLINE) 100 MG tablet, Take 100 mg by mouth every 8 (eight) hours. , Disp: , Rfl:  .  insulin aspart (NOVOLOG FLEXPEN) 100 UNIT/ML FlexPen, Inject 2-6 Units into the skin 3 (three) times daily as needed for high blood sugar (CBG>100)., Disp: , Rfl:  .  Insulin Glargine (LANTUS SOLOSTAR) 100 UNIT/ML Solostar Pen, Inject 10 Units into the skin at bedtime., Disp: , Rfl:  .  labetalol (NORMODYNE) 200 MG tablet, Take 400 mg by mouth every 8 (eight) hours. , Disp: , Rfl:  .  loperamide (IMODIUM) 2 MG capsule, , Disp: , Rfl:  .  LORazepam (ATIVAN)  0.5 MG tablet, Take 0.5 mg by mouth See admin instructions. Take one tablet (0.5 mg) by mouth 30 minutes prior dialysis on Monday, Wednesday, Friday, Disp: , Rfl:  .  losartan (COZAAR) 100 MG tablet, Take 1 tablet (100 mg total) by mouth daily., Disp: 30 tablet, Rfl: 0 .  Methoxy PEG-Epoetin Beta 100 MCG/0.3ML SOSY, Inject as directed., Disp: , Rfl:  .  mometasone (ELOCON) 0.1 % ointment, , Disp: , Rfl:  .  polyvinyl alcohol (ARTIFICIAL TEARS) 1.4 % ophthalmic solution, Place 1 drop into both eyes 2 (two) times daily., Disp: , Rfl:  .  trimethoprim-polymyxin b (POLYTRIM) ophthalmic solution, Place one drop into both eyes every 4 (four) hours for 7 days., Disp: , Rfl:  .  trimethoprim-polymyxin b (POLYTRIM) ophthalmic solution, PLACE ONE DROP INTO BOTH EYES  EVERY 4 (FOUR) HOURS FOR 7 DAYS., Disp: , Rfl: 0  Social History   Tobacco Use  Smoking Status Current Some Day Smoker  . Years: 25.00  . Types: Cigarettes  Smokeless Tobacco Never Used    Allergies  Allergen Reactions  . Heparin Other (See Comments)    Patient relates vitreous hemorrhage after heparin. Patient relates vitreous hemorrhage after heparin.   Objective:   Vitals:   09/25/18 1140  Temp: 98.8 F (37.1 C)   There is no height or weight on file to calculate BMI. Constitutional Well developed. Well nourished.  Vascular Dorsalis pedis pulses palpable bilaterally. Posterior tibial pulses palpable bilaterally. Capillary refill normal to all digits.  No cyanosis or clubbing noted. Pedal hair growth normal.  Neurologic Normal speech. Oriented to person, place, and time. Protective sensation absent  Dermatologic Multiple blistered areas to the toes with dusky appearance distally. No erythema, warmth, signs of acute infection.   Orthopedic: No pain to palpation either foot.   Radiographs: Taken and reviewed no acute fractures or erosions. Assessment:   1. Ischemic ulcer, limited to breakdown of skin ([place])   2. 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 PAD (peripheral artery disease) ([place])    Plan:  Patient was evaluated and treated and all questions answered.  Multiple Blisters Left Foot -No debridement performed. -Betadine applied to the toes. Patient to apply daily -Monitor for signs of worsening and present to ED should they occur.  Return in about 1 week (around 10/02/2018).

## 2018-12-27 NOTE — Progress Notes (Signed)
Subjective:  Patient ID: Karen [name], female    DOB: 1976-07-07,  MRN: [id number]  Chief Complaint  Patient presents with  . Foot Ulcer    left foot great-3 ulcer check; pt stated, "dont know if its doing any better"   43 y.o. female presents for wound care. History as above.  Review of Systems: Negative except as noted in the HPI. Denies N/V/F/Ch.  Past Medical History:  Diagnosis Date  . Diabetes mellitus without complication ([place])   . Hypertension   . Osteomyelitis ([place])    RIGHT FOOT FIFTH TOE  . Renal disorder     Current Outpatient Medications:  .  amLODipine (NORVASC) 10 MG tablet, Take 1 tablet (10 mg total) by mouth daily., Disp: 30 tablet, Rfl: 0 .  B Complex-C-Folic Acid (RENA-VITE PO), Take 1 tablet by mouth daily. , Disp: , Rfl:  .  calcium acetate (PHOSLO) 667 MG capsule, Take 1,334 mg by mouth 3 (three) times daily with meals. , Disp: , Rfl:  .  Cholecalciferol (VITAMIN D-1000 MAX ST) 1000 units tablet, Take 1,000 Units by mouth daily. , Disp: , Rfl:  .  collagenase (SANTYL) ointment, Apply 1 application topically daily., Disp: 15 g, Rfl: 5 .  diphenhydrAMINE (BENADRYL) 25 MG tablet, Take 25 mg by mouth daily as needed (seasonal allergies)., Disp: , Rfl:  .  famotidine (PEPCID) 20 MG tablet, Take 20 mg by mouth See admin instructions. Take one tablet (20 mg) by mouth every morning, take an additional tablet (20 mg) at night if needed for heartburn, Disp: , Rfl:  .  gabapentin (NEURONTIN) 100 MG capsule, Take 100 mg by mouth 3 (three) times daily. , Disp: , Rfl:  .  hydrALAZINE (APRESOLINE) 100 MG tablet, Take 100 mg by mouth every 8 (eight) hours. , Disp: , Rfl:  .  insulin aspart (NOVOLOG FLEXPEN) 100 UNIT/ML FlexPen, Inject 2-6 Units into the skin 3 (three) times daily as needed for high blood sugar (CBG>100)., Disp: , Rfl:  .  Insulin Glargine (LANTUS SOLOSTAR) 100 UNIT/ML Solostar Pen, Inject 10 Units into the skin at bedtime., Disp: , Rfl:  .  labetalol  (NORMODYNE) 200 MG tablet, Take 400 mg by mouth every 8 (eight) hours. , Disp: , Rfl:  .  LORazepam (ATIVAN) 0.5 MG tablet, Take 0.5 mg by mouth See admin instructions. Take one tablet (0.5 mg) by mouth 30 minutes prior dialysis on Monday, Wednesday, Friday, Disp: , Rfl:  .  losartan (COZAAR) 100 MG tablet, Take 1 tablet (100 mg total) by mouth daily., Disp: 30 tablet, Rfl: 0 .  polyvinyl alcohol (ARTIFICIAL TEARS) 1.4 % ophthalmic solution, Place 1 drop into both eyes 2 (two) times daily., Disp: , Rfl:  .  colestipol (COLESTID) 1 g tablet, Take by mouth., Disp: , Rfl:  .  cyclobenzaprine (FLEXERIL) 10 MG tablet, TAKE 1 TABLET BY MOUTH 3 TIMES A DAY AS NEEDED FOR MUSCLE SPASMS FOR 10 DAYS, Disp: , Rfl: 0 .  diclofenac sodium (VOLTAREN) 1 % GEL, Place onto the skin., Disp: , Rfl:  .  diclofenac sodium (VOLTAREN) 1 % GEL, PLACE ONTO THE SKIN 4 (FOUR) TIMES A DAY AS NEEDED., Disp: , Rfl: 1 .  doxycycline (VIBRAMYCIN) 100 MG capsule, TAKE ONE CAPSULE (100 MG DOSE) BY MOUTH 2 (TWO) TIMES DAILY FOR 10 DAYS., Disp: , Rfl: 0 .  erythromycin ophthalmic ointment, APPLY SMALL THIN LAYER TO UPPER AND LOWER EYE LIDS., Disp: , Rfl: 0 .  ethyl chloride spray, See admin instructions., Disp: ,  Rfl: 2 .  fluticasone (FLONASE) 50 MCG/ACT nasal spray, Place 1-2 sprays into both nostrils daily., Disp: 16 g, Rfl: 0 .  glucagon 1 MG injection, Inject into the skin., Disp: , Rfl:  .  loperamide (IMODIUM) 2 MG capsule, , Disp: , Rfl:  .  Methoxy PEG-Epoetin Beta 100 MCG/0.3ML SOSY, Inject as directed., Disp: , Rfl:  .  mometasone (ELOCON) 0.1 % ointment, , Disp: , Rfl:  .  trimethoprim-polymyxin b (POLYTRIM) ophthalmic solution, Place one drop into both eyes every 4 (four) hours for 7 days., Disp: , Rfl:  .  trimethoprim-polymyxin b (POLYTRIM) ophthalmic solution, PLACE ONE DROP INTO BOTH EYES EVERY 4 (FOUR) HOURS FOR 7 DAYS., Disp: , Rfl: 0  Social History   Tobacco Use  Smoking Status Current Some Day Smoker  .  Years: 25.00  . Types: Cigarettes  Smokeless Tobacco Never Used    Allergies  Allergen Reactions  . Heparin Other (See Comments)    Patient relates vitreous hemorrhage after heparin. Patient relates vitreous hemorrhage after heparin.   Objective:   There were no vitals filed for this visit. There is no height or weight on file to calculate BMI. Constitutional Well developed. Well nourished.  Vascular Dorsalis pedis pulses palpable bilaterally. Posterior tibial pulses palpable bilaterally. Capillary refill normal to all digits.  No cyanosis or clubbing noted. Pedal hair growth normal.  Neurologic Normal speech. Oriented to person, place, and time. Protective sensation absent  Dermatologic Multiple blistered areas to the toes with eschar   Orthopedic: No pain to palpation either foot.   Radiographs: Taken and reviewed no acute fractures or erosions. Assessment:   1. 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 Ischemic ulcer, limited to breakdown of skin ([place])   3. PAD (peripheral artery disease) ([place])    Plan:  Patient was evaluated and treated and all questions answered.  Multiple Blisters Left Foot -No debridement performed. -Continue betadine.  -Monitor for signs of worsening and present to ED should they occur.  No follow-ups on file.

## 2019-01-14 ENCOUNTER — Telehealth: Payer: Self-pay | Admitting: *Deleted

## 2019-01-14 ENCOUNTER — Ambulatory Visit (INDEPENDENT_AMBULATORY_CARE_PROVIDER_SITE_OTHER): Payer: Medicare Other | Admitting: Podiatry

## 2019-01-14 DIAGNOSIS — E1142 Type 2 diabetes mellitus with diabetic polyneuropathy: Secondary | ICD-10-CM

## 2019-01-14 DIAGNOSIS — B351 Tinea unguium: Secondary | ICD-10-CM | POA: Diagnosis not present

## 2019-01-14 DIAGNOSIS — L97521 Non-pressure chronic ulcer of other part of left foot limited to breakdown of skin: Secondary | ICD-10-CM

## 2019-01-14 DIAGNOSIS — E11621 Type 2 diabetes mellitus with foot ulcer: Secondary | ICD-10-CM

## 2019-01-14 NOTE — Telephone Encounter (Signed)
Dr. March Rummage ordered Calcium Alginate with silver, antibacterial gauze, sterile 4x4, betadine in bottles for left foot ulcer E11.621, L97.521 of left foot 3.0 x 2.0 x 0.5cm for daily dressing changes from [facility]. Faxed to [facility].

## 2019-01-19 DIAGNOSIS — J4520 Mild intermittent asthma, uncomplicated: Secondary | ICD-10-CM | POA: Insufficient documentation

## 2019-01-30 NOTE — Progress Notes (Signed)
Subjective:  Patient ID: Karen [name], female    DOB: [birth year],  MRN: [id number]  Chief Complaint  Patient presents with  . Wound Check    Left 1st toe ulcer check  . Nail Problem    Bilateral 1-5 trim  . Foot Problem    Right 1st toe redness    43 y.o. female presents for wound care.  Thinks the wounds are doing better no new concerns today.  Review of Systems: Negative except as noted in the HPI. Denies N/V/F/Ch.  Past Medical History:  Diagnosis Date  . Diabetes mellitus without complication ([place])   . Hypertension   . Osteomyelitis ([place])    RIGHT FOOT FIFTH TOE  . Renal disorder     Current Outpatient Medications:  .  amLODipine (NORVASC) 10 MG tablet, Take 1 tablet (10 mg total) by mouth daily., Disp: 30 tablet, Rfl: 0 .  B Complex-C-Folic Acid (RENA-VITE PO), Take 1 tablet by mouth daily. , Disp: , Rfl:  .  calcium acetate (PHOSLO) 667 MG capsule, Take 1,334 mg by mouth 3 (three) times daily with meals. , Disp: , Rfl:  .  Cholecalciferol (VITAMIN D-1000 MAX ST) 1000 units tablet, Take 1,000 Units by mouth daily. , Disp: , Rfl:  .  colestipol (COLESTID) 1 g tablet, Take by mouth., Disp: , Rfl:  .  collagenase (SANTYL) ointment, Apply 1 application topically daily., Disp: 15 g, Rfl: 5 .  cyclobenzaprine (FLEXERIL) 10 MG tablet, TAKE 1 TABLET BY MOUTH 3 TIMES A DAY AS NEEDED FOR MUSCLE SPASMS FOR 10 DAYS, Disp: , Rfl: 0 .  diclofenac sodium (VOLTAREN) 1 % GEL, Place onto the skin., Disp: , Rfl:  .  diclofenac sodium (VOLTAREN) 1 % GEL, PLACE ONTO THE SKIN 4 (FOUR) TIMES A DAY AS NEEDED., Disp: , Rfl: 1 .  diphenhydrAMINE (BENADRYL) 25 MG tablet, Take 25 mg by mouth daily as needed (seasonal allergies)., Disp: , Rfl:  .  doxycycline (VIBRAMYCIN) 100 MG capsule, TAKE ONE CAPSULE (100 MG DOSE) BY MOUTH 2 (TWO) TIMES DAILY FOR 10 DAYS., Disp: , Rfl: 0 .  erythromycin ophthalmic ointment, APPLY SMALL THIN LAYER TO UPPER AND LOWER EYE LIDS., Disp: , Rfl: 0 .  ethyl  chloride spray, See admin instructions., Disp: , Rfl: 2 .  famotidine (PEPCID) 20 MG tablet, Take 20 mg by mouth See admin instructions. Take one tablet (20 mg) by mouth every morning, take an additional tablet (20 mg) at night if needed for heartburn, Disp: , Rfl:  .  fluticasone (FLONASE) 50 MCG/ACT nasal spray, Place 1-2 sprays into both nostrils daily., Disp: 16 g, Rfl: 0 .  FOSRENOL 1000 MG PACK, , Disp: , Rfl:  .  gabapentin (NEURONTIN) 100 MG capsule, Take 100 mg by mouth 3 (three) times daily. , Disp: , Rfl:  .  glucagon 1 MG injection, Inject into the skin., Disp: , Rfl:  .  hydrALAZINE (APRESOLINE) 100 MG tablet, Take 100 mg by mouth every 8 (eight) hours. , Disp: , Rfl:  .  insulin aspart (NOVOLOG FLEXPEN) 100 UNIT/ML FlexPen, Inject 2-6 Units into the skin 3 (three) times daily as needed for high blood sugar (CBG>100)., Disp: , Rfl:  .  Insulin Glargine (LANTUS SOLOSTAR) 100 UNIT/ML Solostar Pen, Inject 10 Units into the skin at bedtime., Disp: , Rfl:  .  labetalol (NORMODYNE) 200 MG tablet, Take 400 mg by mouth every 8 (eight) hours. , Disp: , Rfl:  .  loperamide (IMODIUM) 2 MG capsule, , Disp: ,  Rfl:  .  LORazepam (ATIVAN) 0.5 MG tablet, Take 0.5 mg by mouth See admin instructions. Take one tablet (0.5 mg) by mouth 30 minutes prior dialysis on Monday, Wednesday, Friday, Disp: , Rfl:  .  losartan (COZAAR) 100 MG tablet, Take 1 tablet (100 mg total) by mouth daily., Disp: 30 tablet, Rfl: 0 .  Methoxy PEG-Epoetin Beta 100 MCG/0.3ML SOSY, Inject as directed., Disp: , Rfl:  .  mometasone (ELOCON) 0.1 % ointment, , Disp: , Rfl:  .  polyvinyl alcohol (ARTIFICIAL TEARS) 1.4 % ophthalmic solution, Place 1 drop into both eyes 2 (two) times daily., Disp: , Rfl:  .  trimethoprim-polymyxin b (POLYTRIM) ophthalmic solution, Place one drop into both eyes every 4 (four) hours for 7 days., Disp: , Rfl:  .  trimethoprim-polymyxin b (POLYTRIM) ophthalmic solution, PLACE ONE DROP INTO BOTH EYES EVERY 4  (FOUR) HOURS FOR 7 DAYS., Disp: , Rfl: 0  Social History   Tobacco Use  Smoking Status Current Some Day Smoker  . Years: 25.00  . Types: Cigarettes  Smokeless Tobacco Never Used    Allergies  Allergen Reactions  . Heparin Other (See Comments)    Patient relates vitreous hemorrhage after heparin. Patient relates vitreous hemorrhage after heparin.   Objective:  There were no vitals filed for this visit. There is no height or weight on file to calculate BMI. Constitutional Well developed. Well nourished.  Vascular Dorsalis pedis pulses palpable bilaterally. Posterior tibial pulses non-palpable bilaterally. Capillary refill normal to all digits.  No cyanosis or clubbing noted. Pedal hair growth normal.  Neurologic Normal speech. Oriented to person, place, and time. Protective sensation absent  Dermatologic Wounds as below: -L Hallux 1x1 remaining eschar All wounds without warmth or erythema. So signs of acute infection.  Orthopedic: No pain to palpation either foot.   Radiographs: None today Assessment:   1. 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 Onychomycosis   3. DM type 2 with diabetic peripheral neuropathy ([place])    Plan:  Patient was evaluated and treated and all questions answered.  Ulcers L Foot -Debridement of ulcer as below  Procedure: Selective Debridement of Wound Rationale: Removal of devitalized tissue from the wound to promote healing.  Pre-Debridement Wound Measurements: 1 cm x 1 cm x 0.1 cm  Post-Debridement Wound Measurements: same as pre-debridement. Type of Debridement: sharp selective Tissue Removed: Devitalized soft-tissue Dressing: Dry, sterile, compression dressing. Disposition: Patient tolerated procedure well. Patient to return in 1 week for follow-up.   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 Instrumentation: Nail nipper, rotary burr. Number of Nails: 10  Return in about 1 month (around 02/12/2019).

## 2019-02-11 ENCOUNTER — Ambulatory Visit (INDEPENDENT_AMBULATORY_CARE_PROVIDER_SITE_OTHER): Payer: Medicare Other | Admitting: Podiatry

## 2019-02-11 ENCOUNTER — Other Ambulatory Visit: Payer: Self-pay

## 2019-02-11 DIAGNOSIS — L97521 Non-pressure chronic ulcer of other part of left foot limited to breakdown of skin: Secondary | ICD-10-CM

## 2019-02-11 DIAGNOSIS — E11621 Type 2 diabetes mellitus with foot ulcer: Secondary | ICD-10-CM | POA: Diagnosis not present

## 2019-02-23 ENCOUNTER — Telehealth: Payer: Self-pay | Admitting: Podiatry

## 2019-02-23 MED ORDER — CLINDAMYCIN HCL 300 MG PO CAPS: 300.0000 mg | ORAL_CAPSULE | Freq: Two times a day (BID) | ORAL | 0 refills | Status: DC

## 2019-02-23 NOTE — Telephone Encounter (Signed)
I informed pt of Dr. Eleanora Neighbor orders for Clindamycin 300mg  #20 one capsule bid and to make an appt for Thursday. Pt states understanding.

## 2019-02-23 NOTE — Addendum Note (Signed)
Addended by: Harriett Sine D on: 02/23/2019 11:40 AM   Modules accepted: Orders

## 2019-02-23 NOTE — Telephone Encounter (Signed)
I have a wound on my toe and its starting to pus. I was wanting to see if Dr. March Rummage can send antibiotics?

## 2019-02-25 ENCOUNTER — Ambulatory Visit (INDEPENDENT_AMBULATORY_CARE_PROVIDER_SITE_OTHER): Payer: Medicare Other | Admitting: Podiatry

## 2019-02-25 ENCOUNTER — Other Ambulatory Visit: Payer: Self-pay | Admitting: Podiatry

## 2019-02-25 ENCOUNTER — Ambulatory Visit (INDEPENDENT_AMBULATORY_CARE_PROVIDER_SITE_OTHER): Payer: Medicare Other

## 2019-02-25 ENCOUNTER — Other Ambulatory Visit: Payer: Self-pay

## 2019-02-25 VITALS — Temp 97.0°F

## 2019-02-25 DIAGNOSIS — L97521 Non-pressure chronic ulcer of other part of left foot limited to breakdown of skin: Principal | ICD-10-CM

## 2019-02-25 DIAGNOSIS — E11621 Type 2 diabetes mellitus with foot ulcer: Secondary | ICD-10-CM | POA: Diagnosis not present

## 2019-03-01 NOTE — Progress Notes (Signed)
Subjective:  Patient ID: Karen [name], female    DOB: Jan 23, 1976,  MRN: [id number]  Chief Complaint  Patient presents with  . Wound Check    Left foot tip of 1st digit wound check. Pt states nausea from antibiotics, but denies fever/chills/vomiting.    43 y.o. female presents for wound care.  States that her wound was previously draining.  States that they applied Betadine to the antibiotic as directed thinks the wound is doing better.  Review of Systems: Negative except as noted in the HPI. Denies N/V/F/Ch.  Past Medical History:  Diagnosis Date  . Diabetes mellitus without complication ([place])   . Hypertension   . Osteomyelitis ([place])    RIGHT FOOT FIFTH TOE  . Renal disorder     Current Outpatient Medications:  .  albuterol (ACCUNEB) 0.63 MG/3ML nebulizer solution, Take 3 mLs (0.63 mg dose) by nebulization every 6 (six) hours as needed for Wheezing., Disp: , Rfl:  .  amLODipine (NORVASC) 10 MG tablet, Take 1 tablet (10 mg total) by mouth daily., Disp: 30 tablet, Rfl: 0 .  amoxicillin-clavulanate (AUGMENTIN) 250-125 MG tablet, TAKE ONE TABLET BY MOUTH DAILY FOR 10 DAYS., Disp: , Rfl:  .  B Complex-C-Folic Acid (RENA-VITE PO), Take 1 tablet by mouth daily. , Disp: , Rfl:  .  calcium acetate (PHOSLO) 667 MG capsule, Take 1,334 mg by mouth 3 (three) times daily with meals. , Disp: , Rfl:  .  Cholecalciferol (VITAMIN D-1000 MAX ST) 1000 units tablet, Take 1,000 Units by mouth daily. , Disp: , Rfl:  .  clindamycin (CLEOCIN) 300 MG capsule, Take 1 capsule (300 mg total) by mouth 2 (two) times daily., Disp: 20 capsule, Rfl: 0 .  colestipol (COLESTID) 1 g tablet, Take by mouth., Disp: , Rfl:  .  collagenase (SANTYL) ointment, Apply 1 application topically daily., Disp: 15 g, Rfl: 5 .  cyclobenzaprine (FLEXERIL) 10 MG tablet, TAKE 1 TABLET BY MOUTH 3 TIMES A DAY AS NEEDED FOR MUSCLE SPASMS FOR 10 DAYS, Disp: , Rfl: 0 .  diclofenac sodium (VOLTAREN) 1 % GEL, Place onto the skin.,  Disp: , Rfl:  .  diclofenac sodium (VOLTAREN) 1 % GEL, PLACE ONTO THE SKIN 4 (FOUR) TIMES A DAY AS NEEDED., Disp: , Rfl: 1 .  diphenhydrAMINE (BENADRYL) 25 MG tablet, Take 25 mg by mouth daily as needed (seasonal allergies)., Disp: , Rfl:  .  doxycycline (VIBRAMYCIN) 100 MG capsule, TAKE ONE CAPSULE (100 MG DOSE) BY MOUTH 2 (TWO) TIMES DAILY FOR 10 DAYS., Disp: , Rfl: 0 .  erythromycin ophthalmic ointment, APPLY SMALL THIN LAYER TO UPPER AND LOWER EYE LIDS., Disp: , Rfl: 0 .  ethyl chloride spray, See admin instructions., Disp: , Rfl: 2 .  famotidine (PEPCID) 20 MG tablet, Take 20 mg by mouth See admin instructions. Take one tablet (20 mg) by mouth every morning, take an additional tablet (20 mg) at night if needed for heartburn, Disp: , Rfl:  .  fluconazole (DIFLUCAN) 150 MG tablet, TAKE 1 TABLET BY MOUTH FOR 1 DOSE, Disp: , Rfl:  .  fluticasone (FLONASE) 50 MCG/ACT nasal spray, Place 1-2 sprays into both nostrils daily., Disp: 16 g, Rfl: 0 .  FOSRENOL 1000 MG PACK, , Disp: , Rfl:  .  gabapentin (NEURONTIN) 100 MG capsule, Take 100 mg by mouth 3 (three) times daily. , Disp: , Rfl:  .  glucagon 1 MG injection, Inject into the skin., Disp: , Rfl:  .  hydrALAZINE (APRESOLINE) 100 MG tablet, Take 100  mg by mouth every 8 (eight) hours. , Disp: , Rfl:  .  insulin aspart (NOVOLOG FLEXPEN) 100 UNIT/ML FlexPen, Inject 2-6 Units into the skin 3 (three) times daily as needed for high blood sugar (CBG>100)., Disp: , Rfl:  .  Insulin Glargine (LANTUS SOLOSTAR) 100 UNIT/ML Solostar Pen, Inject 10 Units into the skin at bedtime., Disp: , Rfl:  .  labetalol (NORMODYNE) 200 MG tablet, Take 400 mg by mouth every 8 (eight) hours. , Disp: , Rfl:  .  loperamide (IMODIUM) 2 MG capsule, , Disp: , Rfl:  .  LORazepam (ATIVAN) 0.5 MG tablet, Take 0.5 mg by mouth See admin instructions. Take one tablet (0.5 mg) by mouth 30 minutes prior dialysis on Monday, Wednesday, Friday, Disp: , Rfl:  .  losartan (COZAAR) 100 MG tablet,  Take 1 tablet (100 mg total) by mouth daily., Disp: 30 tablet, Rfl: 0 .  Methoxy PEG-Epoetin Beta 100 MCG/0.3ML SOSY, Inject as directed., Disp: , Rfl:  .  mometasone (ELOCON) 0.1 % ointment, , Disp: , Rfl:  .  polyvinyl alcohol (ARTIFICIAL TEARS) 1.4 % ophthalmic solution, Place 1 drop into both eyes 2 (two) times daily., Disp: , Rfl:  .  Respiratory Therapy Supplies (NEBULIZER) DEVI, Use as directed every 4-6 hours as needed, Disp: , Rfl:  .  trimethoprim-polymyxin b (POLYTRIM) ophthalmic solution, Place one drop into both eyes every 4 (four) hours for 7 days., Disp: , Rfl:  .  trimethoprim-polymyxin b (POLYTRIM) ophthalmic solution, PLACE ONE DROP INTO BOTH EYES EVERY 4 (FOUR) HOURS FOR 7 DAYS., Disp: , Rfl: 0  Social History   Tobacco Use  Smoking Status Current Some Day Smoker  . Years: 25.00  . Types: Cigarettes  Smokeless Tobacco Never Used    Allergies  Allergen Reactions  . Heparin Other (See Comments)    Patient relates vitreous hemorrhage after heparin. Patient relates vitreous hemorrhage after heparin.   Objective:   Vitals:   02/25/19 0955  Temp: (!) 97 F (36.1 C)   There is no height or weight on file to calculate BMI. Constitutional Well developed. Well nourished.  Vascular Dorsalis pedis pulses palpable bilaterally. Posterior tibial pulses non-palpable bilaterally. Capillary refill normal to all digits.  No cyanosis or clubbing noted. Pedal hair growth normal.  Neurologic Normal speech. Oriented to person, place, and time. Protective sensation absent  Dermatologic Wounds as below: -L Hallux small wound measuring 0.3 x 0.2 fibro-granular without warmth erythema signs of acute infection light serosanguineous drainage only  Orthopedic: No pain to palpation either foot.   Radiographs: None today Assessment:   1. 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  Ulcer L Hallux -Lesion debrided as below. -Continue abx to completion -Dress with silvadene and DSD  Procedure: Excisional Debridement of Wound Rationale: Removal of non-viable soft tissue from the wound to promote healing.  Anesthesia: none Pre-Debridement Wound Measurements: 0.3 cm x 0.2 cm x 0.1 cm  Post-Debridement Wound Measurements: 0.3 cm x 0.3 cm x 0.1 cm  Type of Debridement: Sharp Excisional Tissue Removed: Non-viable soft tissue Depth of Debridement: subcutaneous tissue. Technique: Sharp excisional debridement to bleeding, viable wound base.  Dressing: Dry, sterile, compression dressing. Disposition: Patient tolerated procedure well. Patient to return in 1 week for follow-up.     Return in about 1 week (around 03/04/2019) for Left, Wound Care.

## 2019-03-02 NOTE — Progress Notes (Signed)
Subjective:  Patient ID: Karen [name], female    DOB: Mar 29, 1976,  MRN: [id number]  Chief Complaint  Patient presents with  . Wound Check    Left 1st wound check. Pt states wound is healing well and improving, no concerns. Pt denies fever/nausea/vomiting/chills.    43 y.o. female presents for wound care.  Thinks the wounds are doing better no new concerns today.  Review of Systems: Negative except as noted in the HPI. Denies N/V/F/Ch.  Past Medical History:  Diagnosis Date  . Diabetes mellitus without complication ([place])   . Hypertension   . Osteomyelitis ([place])    RIGHT FOOT FIFTH TOE  . Renal disorder     Current Outpatient Medications:  .  albuterol (ACCUNEB) 0.63 MG/3ML nebulizer solution, Take 3 mLs (0.63 mg dose) by nebulization every 6 (six) hours as needed for Wheezing., Disp: , Rfl:  .  amLODipine (NORVASC) 10 MG tablet, Take 1 tablet (10 mg total) by mouth daily., Disp: 30 tablet, Rfl: 0 .  B Complex-C-Folic Acid (RENA-VITE PO), Take 1 tablet by mouth daily. , Disp: , Rfl:  .  calcium acetate (PHOSLO) 667 MG capsule, Take 1,334 mg by mouth 3 (three) times daily with meals. , Disp: , Rfl:  .  Cholecalciferol (VITAMIN D-1000 MAX ST) 1000 units tablet, Take 1,000 Units by mouth daily. , Disp: , Rfl:  .  colestipol (COLESTID) 1 g tablet, Take by mouth., Disp: , Rfl:  .  collagenase (SANTYL) ointment, Apply 1 application topically daily., Disp: 15 g, Rfl: 5 .  cyclobenzaprine (FLEXERIL) 10 MG tablet, TAKE 1 TABLET BY MOUTH 3 TIMES A DAY AS NEEDED FOR MUSCLE SPASMS FOR 10 DAYS, Disp: , Rfl: 0 .  diclofenac sodium (VOLTAREN) 1 % GEL, Place onto the skin., Disp: , Rfl:  .  diclofenac sodium (VOLTAREN) 1 % GEL, PLACE ONTO THE SKIN 4 (FOUR) TIMES A DAY AS NEEDED., Disp: , Rfl: 1 .  diphenhydrAMINE (BENADRYL) 25 MG tablet, Take 25 mg by mouth daily as needed (seasonal allergies)., Disp: , Rfl:  .  doxycycline (VIBRAMYCIN) 100 MG capsule, TAKE ONE CAPSULE (100 MG DOSE) BY  MOUTH 2 (TWO) TIMES DAILY FOR 10 DAYS., Disp: , Rfl: 0 .  erythromycin ophthalmic ointment, APPLY SMALL THIN LAYER TO UPPER AND LOWER EYE LIDS., Disp: , Rfl: 0 .  ethyl chloride spray, See admin instructions., Disp: , Rfl: 2 .  famotidine (PEPCID) 20 MG tablet, Take 20 mg by mouth See admin instructions. Take one tablet (20 mg) by mouth every morning, take an additional tablet (20 mg) at night if needed for heartburn, Disp: , Rfl:  .  fluticasone (FLONASE) 50 MCG/ACT nasal spray, Place 1-2 sprays into both nostrils daily., Disp: 16 g, Rfl: 0 .  FOSRENOL 1000 MG PACK, , Disp: , Rfl:  .  gabapentin (NEURONTIN) 100 MG capsule, Take 100 mg by mouth 3 (three) times daily. , Disp: , Rfl:  .  glucagon 1 MG injection, Inject into the skin., Disp: , Rfl:  .  hydrALAZINE (APRESOLINE) 100 MG tablet, Take 100 mg by mouth every 8 (eight) hours. , Disp: , Rfl:  .  insulin aspart (NOVOLOG FLEXPEN) 100 UNIT/ML FlexPen, Inject 2-6 Units into the skin 3 (three) times daily as needed for high blood sugar (CBG>100)., Disp: , Rfl:  .  Insulin Glargine (LANTUS SOLOSTAR) 100 UNIT/ML Solostar Pen, Inject 10 Units into the skin at bedtime., Disp: , Rfl:  .  labetalol (NORMODYNE) 200 MG tablet, Take 400 mg by mouth  every 8 (eight) hours. , Disp: , Rfl:  .  loperamide (IMODIUM) 2 MG capsule, , Disp: , Rfl:  .  LORazepam (ATIVAN) 0.5 MG tablet, Take 0.5 mg by mouth See admin instructions. Take one tablet (0.5 mg) by mouth 30 minutes prior dialysis on Monday, Wednesday, Friday, Disp: , Rfl:  .  losartan (COZAAR) 100 MG tablet, Take 1 tablet (100 mg total) by mouth daily., Disp: 30 tablet, Rfl: 0 .  Methoxy PEG-Epoetin Beta 100 MCG/0.3ML SOSY, Inject as directed., Disp: , Rfl:  .  mometasone (ELOCON) 0.1 % ointment, , Disp: , Rfl:  .  polyvinyl alcohol (ARTIFICIAL TEARS) 1.4 % ophthalmic solution, Place 1 drop into both eyes 2 (two) times daily., Disp: , Rfl:  .  Respiratory Therapy Supplies (NEBULIZER) DEVI, Use as directed  every 4-6 hours as needed, Disp: , Rfl:  .  trimethoprim-polymyxin b (POLYTRIM) ophthalmic solution, Place one drop into both eyes every 4 (four) hours for 7 days., Disp: , Rfl:  .  trimethoprim-polymyxin b (POLYTRIM) ophthalmic solution, PLACE ONE DROP INTO BOTH EYES EVERY 4 (FOUR) HOURS FOR 7 DAYS., Disp: , Rfl: 0 .  amoxicillin-clavulanate (AUGMENTIN) 250-125 MG tablet, TAKE ONE TABLET BY MOUTH DAILY FOR 10 DAYS., Disp: , Rfl:  .  clindamycin (CLEOCIN) 300 MG capsule, Take 1 capsule (300 mg total) by mouth 2 (two) times daily., Disp: 20 capsule, Rfl: 0 .  fluconazole (DIFLUCAN) 150 MG tablet, TAKE 1 TABLET BY MOUTH FOR 1 DOSE, Disp: , Rfl:   Social History   Tobacco Use  Smoking Status Current Some Day Smoker  . Years: 25.00  . Types: Cigarettes  Smokeless Tobacco Never Used    Allergies  Allergen Reactions  . Heparin Other (See Comments)    Patient relates vitreous hemorrhage after heparin. Patient relates vitreous hemorrhage after heparin.   Objective:  There were no vitals filed for this visit. There is no height or weight on file to calculate BMI. Constitutional Well developed. Well nourished.  Vascular Dorsalis pedis pulses palpable bilaterally. Posterior tibial pulses non-palpable bilaterally. Capillary refill normal to all digits.  No cyanosis or clubbing noted. Pedal hair growth normal.  Neurologic Normal speech. Oriented to person, place, and time. Protective sensation absent  Dermatologic Wounds as below: -L Hallux small remaining eschar, <0.2 cm All wounds without warmth or erythema. So signs of acute infection.  Orthopedic: No pain to palpation either foot.   Radiographs: None today Assessment:   1. 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  Ulcers L Foot -Improving. Continue silvadene. F/u should she  experience worsening.  Return in about 6 weeks (around 03/25/2019) for Wound Care left great toe .

## 2019-03-04 ENCOUNTER — Ambulatory Visit (INDEPENDENT_AMBULATORY_CARE_PROVIDER_SITE_OTHER): Payer: Medicare Other | Admitting: Podiatry

## 2019-03-04 ENCOUNTER — Encounter: Payer: Self-pay | Admitting: Podiatry

## 2019-03-04 ENCOUNTER — Other Ambulatory Visit: Payer: Self-pay

## 2019-03-04 VITALS — Temp 97.5°F

## 2019-03-04 DIAGNOSIS — E11621 Type 2 diabetes mellitus with foot ulcer: Secondary | ICD-10-CM

## 2019-03-04 DIAGNOSIS — L97521 Non-pressure chronic ulcer of other part of left foot limited to breakdown of skin: Secondary | ICD-10-CM | POA: Diagnosis not present

## 2019-03-04 NOTE — Progress Notes (Signed)
Subjective:  Patient ID: Karen [name], female    DOB: 11-10-1976,  MRN: [id number]  Chief Complaint  Patient presents with  . Foot Ulcer    Follow up ulcer hallux left   "I can't see it, but my sister says it looks better"    43 y.o. female presents with the above complaint. Hx as above   Review of Systems: Negative except as noted in the HPI. Denies N/V/F/Ch.  Past Medical History:  Diagnosis Date  . Diabetes mellitus without complication ([place])   . Hypertension   . Osteomyelitis ([place])    RIGHT FOOT FIFTH TOE  . Renal disorder     Current Outpatient Medications:  .  albuterol (ACCUNEB) 0.63 MG/3ML nebulizer solution, Take 3 mLs (0.63 mg dose) by nebulization every 6 (six) hours as needed for Wheezing., Disp: , Rfl:  .  amLODipine (NORVASC) 10 MG tablet, Take 1 tablet (10 mg total) by mouth daily., Disp: 30 tablet, Rfl: 0 .  amoxicillin-clavulanate (AUGMENTIN) 250-125 MG tablet, TAKE ONE TABLET BY MOUTH DAILY FOR 10 DAYS., Disp: , Rfl:  .  B Complex-C-Folic Acid (RENA-VITE PO), Take 1 tablet by mouth daily. , Disp: , Rfl:  .  calcium acetate (PHOSLO) 667 MG capsule, Take 1,334 mg by mouth 3 (three) times daily with meals. , Disp: , Rfl:  .  Cholecalciferol (VITAMIN D-1000 MAX ST) 1000 units tablet, Take 1,000 Units by mouth daily. , Disp: , Rfl:  .  clindamycin (CLEOCIN) 300 MG capsule, Take 1 capsule (300 mg total) by mouth 2 (two) times daily., Disp: 20 capsule, Rfl: 0 .  colestipol (COLESTID) 1 g tablet, Take by mouth., Disp: , Rfl:  .  collagenase (SANTYL) ointment, Apply 1 application topically daily., Disp: 15 g, Rfl: 5 .  cyclobenzaprine (FLEXERIL) 10 MG tablet, TAKE 1 TABLET BY MOUTH 3 TIMES A DAY AS NEEDED FOR MUSCLE SPASMS FOR 10 DAYS, Disp: , Rfl: 0 .  diclofenac sodium (VOLTAREN) 1 % GEL, Place onto the skin., Disp: , Rfl:  .  diclofenac sodium (VOLTAREN) 1 % GEL, PLACE ONTO THE SKIN 4 (FOUR) TIMES A DAY AS NEEDED., Disp: , Rfl: 1 .  diphenhydrAMINE (BENADRYL)  25 MG tablet, Take 25 mg by mouth daily as needed (seasonal allergies)., Disp: , Rfl:  .  doxycycline (VIBRAMYCIN) 100 MG capsule, TAKE ONE CAPSULE (100 MG DOSE) BY MOUTH 2 (TWO) TIMES DAILY FOR 10 DAYS., Disp: , Rfl: 0 .  erythromycin ophthalmic ointment, APPLY SMALL THIN LAYER TO UPPER AND LOWER EYE LIDS., Disp: , Rfl: 0 .  ethyl chloride spray, See admin instructions., Disp: , Rfl: 2 .  famotidine (PEPCID) 20 MG tablet, Take 20 mg by mouth See admin instructions. Take one tablet (20 mg) by mouth every morning, take an additional tablet (20 mg) at night if needed for heartburn, Disp: , Rfl:  .  fluconazole (DIFLUCAN) 150 MG tablet, TAKE 1 TABLET BY MOUTH FOR 1 DOSE, Disp: , Rfl:  .  fluticasone (FLONASE) 50 MCG/ACT nasal spray, Place 1-2 sprays into both nostrils daily., Disp: 16 g, Rfl: 0 .  FOSRENOL 1000 MG PACK, , Disp: , Rfl:  .  gabapentin (NEURONTIN) 100 MG capsule, Take 100 mg by mouth 3 (three) times daily. , Disp: , Rfl:  .  glucagon 1 MG injection, Inject into the skin., Disp: , Rfl:  .  hydrALAZINE (APRESOLINE) 100 MG tablet, Take 100 mg by mouth every 8 (eight) hours. , Disp: , Rfl:  .  insulin aspart (NOVOLOG FLEXPEN)  100 UNIT/ML FlexPen, Inject 2-6 Units into the skin 3 (three) times daily as needed for high blood sugar (CBG>100)., Disp: , Rfl:  .  Insulin Glargine (LANTUS SOLOSTAR) 100 UNIT/ML Solostar Pen, Inject 10 Units into the skin at bedtime., Disp: , Rfl:  .  labetalol (NORMODYNE) 200 MG tablet, Take 400 mg by mouth every 8 (eight) hours. , Disp: , Rfl:  .  loperamide (IMODIUM) 2 MG capsule, , Disp: , Rfl:  .  LORazepam (ATIVAN) 0.5 MG tablet, Take 0.5 mg by mouth See admin instructions. Take one tablet (0.5 mg) by mouth 30 minutes prior dialysis on Monday, Wednesday, Friday, Disp: , Rfl:  .  losartan (COZAAR) 100 MG tablet, Take 1 tablet (100 mg total) by mouth daily., Disp: 30 tablet, Rfl: 0 .  Methoxy PEG-Epoetin Beta 100 MCG/0.3ML SOSY, Inject as directed., Disp: , Rfl:   .  mometasone (ELOCON) 0.1 % ointment, , Disp: , Rfl:  .  polyvinyl alcohol (ARTIFICIAL TEARS) 1.4 % ophthalmic solution, Place 1 drop into both eyes 2 (two) times daily., Disp: , Rfl:  .  Respiratory Therapy Supplies (NEBULIZER) DEVI, Use as directed every 4-6 hours as needed, Disp: , Rfl:  .  trimethoprim-polymyxin b (POLYTRIM) ophthalmic solution, Place one drop into both eyes every 4 (four) hours for 7 days., Disp: , Rfl:  .  trimethoprim-polymyxin b (POLYTRIM) ophthalmic solution, PLACE ONE DROP INTO BOTH EYES EVERY 4 (FOUR) HOURS FOR 7 DAYS., Disp: , Rfl: 0  Social History   Tobacco Use  Smoking Status Current Some Day Smoker  . Years: 25.00  . Types: Cigarettes  Smokeless Tobacco Never Used    Allergies  Allergen Reactions  . Heparin Other (See Comments)    Patient relates vitreous hemorrhage after heparin. Patient relates vitreous hemorrhage after heparin.   Objective:  Vitals:   03/04/19 1337  Temp: (!) 97.5 F (36.4 C)   There is no height or weight on file to calculate BMI. Constitutional no acute distress  Vascular normal  Neurologic normal  Dermatologic Ulceration left halux healing well granular base 0.2x0.2  Orthopedic: Swelling: none Warmth: no warmth Tenderness: none ROM: normal  Strength: normal   Radiographs: none Assessment:  1. 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  Ulcer left hallux -cauterized with silver nitrate. band aid applied -fu in one month  Return in about 4 weeks (around 04/01/2019) for Wound Care, Left.

## 2019-03-25 ENCOUNTER — Ambulatory Visit: Payer: Medicare Other | Admitting: Podiatry

## 2019-04-01 ENCOUNTER — Other Ambulatory Visit: Payer: Self-pay

## 2019-04-01 ENCOUNTER — Encounter: Payer: Self-pay | Admitting: Podiatry

## 2019-04-01 ENCOUNTER — Ambulatory Visit (INDEPENDENT_AMBULATORY_CARE_PROVIDER_SITE_OTHER): Payer: Medicare Other | Admitting: Podiatry

## 2019-04-01 VITALS — Temp 97.7°F

## 2019-04-01 DIAGNOSIS — E11621 Type 2 diabetes mellitus with foot ulcer: Secondary | ICD-10-CM

## 2019-04-01 DIAGNOSIS — L97521 Non-pressure chronic ulcer of other part of left foot limited to breakdown of skin: Secondary | ICD-10-CM

## 2019-04-01 NOTE — Progress Notes (Signed)
Subjective:  Patient ID: Karen [name], female    DOB: 11/20/1976,  MRN: [id number]  Chief Complaint  Patient presents with  . Wound Check    L-great toe ulcer check; "sister says think its about healed; no other concerns"    43 y.o. female presents with the above complaint. Hx as above/   Review of Systems: Negative except as noted in the HPI. Denies N/V/F/Ch.  Past Medical History:  Diagnosis Date  . Diabetes mellitus without complication ([place])   . Hypertension   . Osteomyelitis ([place])    RIGHT FOOT FIFTH TOE  . Renal disorder     Current Outpatient Medications:  .  albuterol (ACCUNEB) 0.63 MG/3ML nebulizer solution, Take 3 mLs (0.63 mg dose) by nebulization every 6 (six) hours as needed for Wheezing., Disp: , Rfl:  .  amLODipine (NORVASC) 10 MG tablet, Take 1 tablet (10 mg total) by mouth daily., Disp: 30 tablet, Rfl: 0 .  amoxicillin-clavulanate (AUGMENTIN) 250-125 MG tablet, TAKE ONE TABLET BY MOUTH DAILY FOR 10 DAYS., Disp: , Rfl:  .  B Complex-C-Folic Acid (RENA-VITE PO), Take 1 tablet by mouth daily. , Disp: , Rfl:  .  calcium acetate (PHOSLO) 667 MG capsule, Take 1,334 mg by mouth 3 (three) times daily with meals. , Disp: , Rfl:  .  Cholecalciferol (VITAMIN D-1000 MAX ST) 1000 units tablet, Take 1,000 Units by mouth daily. , Disp: , Rfl:  .  clindamycin (CLEOCIN) 300 MG capsule, Take 1 capsule (300 mg total) by mouth 2 (two) times daily., Disp: 20 capsule, Rfl: 0 .  colestipol (COLESTID) 1 g tablet, Take by mouth., Disp: , Rfl:  .  collagenase (SANTYL) ointment, Apply 1 application topically daily., Disp: 15 g, Rfl: 5 .  cyclobenzaprine (FLEXERIL) 10 MG tablet, TAKE 1 TABLET BY MOUTH 3 TIMES A DAY AS NEEDED FOR MUSCLE SPASMS FOR 10 DAYS, Disp: , Rfl: 0 .  diclofenac sodium (VOLTAREN) 1 % GEL, Place onto the skin., Disp: , Rfl:  .  diclofenac sodium (VOLTAREN) 1 % GEL, PLACE ONTO THE SKIN 4 (FOUR) TIMES A DAY AS NEEDED., Disp: , Rfl: 1 .  diphenhydrAMINE (BENADRYL)  25 MG tablet, Take 25 mg by mouth daily as needed (seasonal allergies)., Disp: , Rfl:  .  doxycycline (VIBRAMYCIN) 100 MG capsule, TAKE ONE CAPSULE (100 MG DOSE) BY MOUTH 2 (TWO) TIMES DAILY FOR 10 DAYS., Disp: , Rfl: 0 .  erythromycin ophthalmic ointment, APPLY SMALL THIN LAYER TO UPPER AND LOWER EYE LIDS., Disp: , Rfl: 0 .  ethyl chloride spray, See admin instructions., Disp: , Rfl: 2 .  famotidine (PEPCID) 20 MG tablet, Take 20 mg by mouth See admin instructions. Take one tablet (20 mg) by mouth every morning, take an additional tablet (20 mg) at night if needed for heartburn, Disp: , Rfl:  .  fluconazole (DIFLUCAN) 150 MG tablet, TAKE 1 TABLET BY MOUTH FOR 1 DOSE, Disp: , Rfl:  .  fluticasone (FLONASE) 50 MCG/ACT nasal spray, Place 1-2 sprays into both nostrils daily., Disp: 16 g, Rfl: 0 .  FOSRENOL 1000 MG PACK, , Disp: , Rfl:  .  gabapentin (NEURONTIN) 100 MG capsule, Take 100 mg by mouth 3 (three) times daily. , Disp: , Rfl:  .  glucagon 1 MG injection, Inject into the skin., Disp: , Rfl:  .  hydrALAZINE (APRESOLINE) 100 MG tablet, Take 100 mg by mouth every 8 (eight) hours. , Disp: , Rfl:  .  insulin aspart (NOVOLOG FLEXPEN) 100 UNIT/ML FlexPen, Inject 2-6  Units into the skin 3 (three) times daily as needed for high blood sugar (CBG>100)., Disp: , Rfl:  .  Insulin Glargine (LANTUS SOLOSTAR) 100 UNIT/ML Solostar Pen, Inject 10 Units into the skin at bedtime., Disp: , Rfl:  .  labetalol (NORMODYNE) 200 MG tablet, Take 400 mg by mouth every 8 (eight) hours. , Disp: , Rfl:  .  loperamide (IMODIUM) 2 MG capsule, , Disp: , Rfl:  .  LORazepam (ATIVAN) 0.5 MG tablet, Take 0.5 mg by mouth See admin instructions. Take one tablet (0.5 mg) by mouth 30 minutes prior dialysis on Monday, Wednesday, Friday, Disp: , Rfl:  .  losartan (COZAAR) 100 MG tablet, Take 1 tablet (100 mg total) by mouth daily., Disp: 30 tablet, Rfl: 0 .  Methoxy PEG-Epoetin Beta 100 MCG/0.3ML SOSY, Inject as directed., Disp: , Rfl:   .  mometasone (ELOCON) 0.1 % ointment, , Disp: , Rfl:  .  polyvinyl alcohol (ARTIFICIAL TEARS) 1.4 % ophthalmic solution, Place 1 drop into both eyes 2 (two) times daily., Disp: , Rfl:  .  Respiratory Therapy Supplies (NEBULIZER) DEVI, Use as directed every 4-6 hours as needed, Disp: , Rfl:  .  trimethoprim-polymyxin b (POLYTRIM) ophthalmic solution, Place one drop into both eyes every 4 (four) hours for 7 days., Disp: , Rfl:  .  trimethoprim-polymyxin b (POLYTRIM) ophthalmic solution, PLACE ONE DROP INTO BOTH EYES EVERY 4 (FOUR) HOURS FOR 7 DAYS., Disp: , Rfl: 0  Social History   Tobacco Use  Smoking Status Current Some Day Smoker  . Years: 25.00  . Types: Cigarettes  Smokeless Tobacco Never Used    Allergies  Allergen Reactions  . Heparin Other (See Comments)    Patient relates vitreous hemorrhage after heparin. Patient relates vitreous hemorrhage after heparin.   Objective:  Vitals:   04/01/19 1333  Temp: 97.7 F (36.5 C)   There is no height or weight on file to calculate BMI. Constitutional no acute distress  Vascular normal  Neurologic normal  Dermatologic Ulceration left hallux with epithelialization, minimal hyperkeratosis.  No open wounds bilateral feet.  Orthopedic: Swelling: none Warmth: no warmth Tenderness: none ROM: normal  Strength: normal   Radiographs: none Assessment:   1. 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  Ulcer left hallux -Wound epithelialized. No open ulceration. -No dressing or medication needed at this time. -Advised to monitor closely for signs of recurrence and to return promptly should the wound open  Return in about 6 weeks (around 05/13/2019) for Wound Check .

## 2019-05-05 DIAGNOSIS — R197 Diarrhea, unspecified: Secondary | ICD-10-CM | POA: Insufficient documentation

## 2019-05-13 ENCOUNTER — Encounter: Payer: Self-pay | Admitting: Podiatry

## 2019-05-13 ENCOUNTER — Ambulatory Visit (INDEPENDENT_AMBULATORY_CARE_PROVIDER_SITE_OTHER): Payer: Medicare Other | Admitting: Podiatry

## 2019-05-13 ENCOUNTER — Other Ambulatory Visit: Payer: Self-pay

## 2019-05-13 VITALS — Temp 97.6°F

## 2019-05-13 DIAGNOSIS — B351 Tinea unguium: Secondary | ICD-10-CM

## 2019-05-13 DIAGNOSIS — E11621 Type 2 diabetes mellitus with foot ulcer: Secondary | ICD-10-CM | POA: Diagnosis not present

## 2019-05-13 DIAGNOSIS — L97529 Non-pressure chronic ulcer of other part of left foot with unspecified severity: Secondary | ICD-10-CM | POA: Diagnosis not present

## 2019-05-13 DIAGNOSIS — M79675 Pain in left toe(s): Secondary | ICD-10-CM | POA: Diagnosis not present

## 2019-05-13 DIAGNOSIS — M79674 Pain in right toe(s): Secondary | ICD-10-CM

## 2019-05-19 NOTE — Progress Notes (Signed)
Subjective: 43 year old female presents the office today for follow-up evaluation of a wound on the left big toe.  She states that she has been under the care of Dr. March Rummage and the wound is healed.  She needs a note stating that she currently has no bladder infection and she is awaiting kidney transplant.  Also she states her nails are thickened discolored she is asking have been trimmed. Denies any systemic complaints such as fevers, chills, nausea, vomiting. No acute changes since last appointment, and no other complaints at this time.   Objective: AAO x3, NAD DP/PT pulses palpable bilaterally, CRT less than 3 seconds Hyperkeratotic lesion of the distal aspect the left hallux and upon debridement.  The wound is healed.  There is no edema, erythema, drainage or pus or any signs of infection.  Nails are hypertrophic, dystrophic with yellow and brown discoloration 1 through 5 on the left and 1 through 4 on the right.  Right fifth ray mutation. No open lesions or pre-ulcerative lesions.  No pain with calf compression, swelling, warmth, erythema  Assessment: 43 year old female with healed ulceration left foot, without signs of infection; symptomatic onychomycosis  Plan: -All treatment options discussed with the patient including all alternatives, risks, complications.  -I debrided the hyperkeratotic lesion overlying the wound to reveal the wound is healed.  Note was written there is no signs of infection wound today. -Nails debrided x9 without complications or bleeding -Discussed importance of daily foot inspection. -Patient encouraged to call the office with any questions, concerns, change in symptoms.   Return in about 9 weeks (around 07/15/2019).  Trula Slade DPM

## 2019-06-29 DIAGNOSIS — Z4802 Encounter for removal of sutures: Secondary | ICD-10-CM | POA: Insufficient documentation

## 2019-07-15 ENCOUNTER — Other Ambulatory Visit: Payer: Self-pay

## 2019-07-15 ENCOUNTER — Ambulatory Visit (INDEPENDENT_AMBULATORY_CARE_PROVIDER_SITE_OTHER): Payer: Medicare Other | Admitting: Podiatry

## 2019-07-15 VITALS — Temp 98.2°F

## 2019-07-15 DIAGNOSIS — L84 Corns and callosities: Secondary | ICD-10-CM

## 2019-07-15 DIAGNOSIS — L97529 Non-pressure chronic ulcer of other part of left foot with unspecified severity: Secondary | ICD-10-CM

## 2019-07-15 DIAGNOSIS — Z89421 Acquired absence of other right toe(s): Secondary | ICD-10-CM | POA: Diagnosis not present

## 2019-07-15 DIAGNOSIS — B351 Tinea unguium: Secondary | ICD-10-CM | POA: Diagnosis not present

## 2019-07-15 DIAGNOSIS — E11621 Type 2 diabetes mellitus with foot ulcer: Secondary | ICD-10-CM | POA: Diagnosis not present

## 2019-07-15 DIAGNOSIS — E1140 Type 2 diabetes mellitus with diabetic neuropathy, unspecified: Secondary | ICD-10-CM

## 2019-07-15 DIAGNOSIS — E1149 Type 2 diabetes mellitus with other diabetic neurological complication: Secondary | ICD-10-CM

## 2019-07-15 NOTE — Progress Notes (Signed)
Subjective:  Patient ID: Karen [name], female    DOB: December 16, 1975,  MRN: [id number]  Chief Complaint  Patient presents with  . Wound Check    Left foot 1st digit wound check. Pt states wound appears healed, no new concerns, denies drainage.  . Nail Problem    Nail trim 1-5 bilateral.   43 y.o. female presents with the above complaint. Hx as above.  Review of Systems: Negative except as noted in the HPI. Denies N/V/F/Ch.  Past Medical History:  Diagnosis Date  . Diabetes mellitus without complication ([place])   . Hypertension   . Osteomyelitis ([place])    RIGHT FOOT FIFTH TOE  . Renal disorder     Current Outpatient Medications:  .  albuterol (ACCUNEB) 0.63 MG/3ML nebulizer solution, Take 3 mLs (0.63 mg dose) by nebulization every 6 (six) hours as needed for Wheezing., Disp: , Rfl:  .  amLODipine (NORVASC) 10 MG tablet, Take 1 tablet (10 mg total) by mouth daily., Disp: 30 tablet, Rfl: 0 .  amoxicillin-clavulanate (AUGMENTIN) 250-125 MG tablet, TAKE ONE TABLET BY MOUTH DAILY FOR 10 DAYS., Disp: , Rfl:  .  B Complex-C-Folic Acid (RENA-VITE PO), Take 1 tablet by mouth daily. , Disp: , Rfl:  .  calcium acetate (PHOSLO) 667 MG capsule, Take 1,334 mg by mouth 3 (three) times daily with meals. , Disp: , Rfl:  .  Cholecalciferol (VITAMIN D-1000 MAX ST) 1000 units tablet, Take 1,000 Units by mouth daily. , Disp: , Rfl:  .  clindamycin (CLEOCIN) 300 MG capsule, Take 1 capsule (300 mg total) by mouth 2 (two) times daily., Disp: 20 capsule, Rfl: 0 .  colestipol (COLESTID) 1 g tablet, Take by mouth., Disp: , Rfl:  .  collagenase (SANTYL) ointment, Apply 1 application topically daily., Disp: 15 g, Rfl: 5 .  cyclobenzaprine (FLEXERIL) 10 MG tablet, TAKE 1 TABLET BY MOUTH 3 TIMES A DAY AS NEEDED FOR MUSCLE SPASMS FOR 10 DAYS, Disp: , Rfl: 0 .  diclofenac sodium (VOLTAREN) 1 % GEL, Place onto the skin., Disp: , Rfl:  .  diclofenac sodium (VOLTAREN) 1 % GEL, PLACE ONTO THE SKIN 4 (FOUR) TIMES A  DAY AS NEEDED., Disp: , Rfl: 1 .  diphenhydrAMINE (BENADRYL) 25 MG tablet, Take 25 mg by mouth daily as needed (seasonal allergies)., Disp: , Rfl:  .  doxycycline (VIBRAMYCIN) 100 MG capsule, TAKE ONE CAPSULE (100 MG DOSE) BY MOUTH 2 (TWO) TIMES DAILY FOR 10 DAYS., Disp: , Rfl: 0 .  erythromycin ophthalmic ointment, APPLY SMALL THIN LAYER TO UPPER AND LOWER EYE LIDS., Disp: , Rfl: 0 .  ethyl chloride spray, See admin instructions., Disp: , Rfl: 2 .  famotidine (PEPCID) 20 MG tablet, Take 20 mg by mouth See admin instructions. Take one tablet (20 mg) by mouth every morning, take an additional tablet (20 mg) at night if needed for heartburn, Disp: , Rfl:  .  fluconazole (DIFLUCAN) 150 MG tablet, TAKE 1 TABLET BY MOUTH FOR 1 DOSE, Disp: , Rfl:  .  fluticasone (FLONASE) 50 MCG/ACT nasal spray, Place 1-2 sprays into both nostrils daily., Disp: 16 g, Rfl: 0 .  FOSRENOL 1000 MG PACK, , Disp: , Rfl:  .  gabapentin (NEURONTIN) 100 MG capsule, Take 100 mg by mouth 3 (three) times daily. , Disp: , Rfl:  .  glucagon 1 MG injection, Inject into the skin., Disp: , Rfl:  .  hydrALAZINE (APRESOLINE) 100 MG tablet, Take 100 mg by mouth every 8 (eight) hours. , Disp: , Rfl:  .  insulin aspart (NOVOLOG FLEXPEN) 100 UNIT/ML FlexPen, Inject 2-6 Units into the skin 3 (three) times daily as needed for high blood sugar (CBG>100)., Disp: , Rfl:  .  Insulin Glargine (LANTUS SOLOSTAR) 100 UNIT/ML Solostar Pen, Inject 10 Units into the skin at bedtime., Disp: , Rfl:  .  labetalol (NORMODYNE) 200 MG tablet, Take 400 mg by mouth every 8 (eight) hours. , Disp: , Rfl:  .  loperamide (IMODIUM) 2 MG capsule, , Disp: , Rfl:  .  LORazepam (ATIVAN) 0.5 MG tablet, Take 0.5 mg by mouth See admin instructions. Take one tablet (0.5 mg) by mouth 30 minutes prior dialysis on Monday, Wednesday, Friday, Disp: , Rfl:  .  losartan (COZAAR) 100 MG tablet, Take 1 tablet (100 mg total) by mouth daily., Disp: 30 tablet, Rfl: 0 .  Methoxy  PEG-Epoetin Beta 100 MCG/0.3ML SOSY, Inject as directed., Disp: , Rfl:  .  mometasone (ELOCON) 0.1 % ointment, , Disp: , Rfl:  .  nicotine (NICODERM CQ - DOSED IN MG/24 HOURS) 14 mg/24hr patch, Place onto the skin., Disp: , Rfl:  .  polyvinyl alcohol (ARTIFICIAL TEARS) 1.4 % ophthalmic solution, Place 1 drop into both eyes 2 (two) times daily., Disp: , Rfl:  .  Respiratory Therapy Supplies (NEBULIZER) DEVI, Use as directed every 4-6 hours as needed, Disp: , Rfl:  .  trimethoprim-polymyxin b (POLYTRIM) ophthalmic solution, Place one drop into both eyes every 4 (four) hours for 7 days., Disp: , Rfl:  .  trimethoprim-polymyxin b (POLYTRIM) ophthalmic solution, PLACE ONE DROP INTO BOTH EYES EVERY 4 (FOUR) HOURS FOR 7 DAYS., Disp: , Rfl: 0  Social History   Tobacco Use  Smoking Status Current Some Day Smoker  . Years: 25.00  . Types: Cigarettes  Smokeless Tobacco Never Used    Allergies  Allergen Reactions  . Heparin Other (See Comments)    Patient relates vitreous hemorrhage after heparin. Patient relates vitreous hemorrhage after heparin.   Objective:  Vitals:   07/15/19 0848  Temp: 98.2 F (36.8 C)   There is no height or weight on file to calculate BMI. Constitutional no acute distress  Vascular normal  Neurologic normal  Dermatologic No open wounds bilateral feet. Nails thickened and dystrophic bilat Pre-ulcer callus left submet  Orthopedic: Amputation right 5th toe and partial metatarsal noted   Radiographs: none Assessment:   1. 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 Onychomycosis   3. History of amputation of lesser toe, right ([place])   4. Diabetic neuropathy with neurologic complication ([facility])   5. Callus of foot    Plan:  Patient was evaluated and treated and all questions answered.  Ulcer left hallux -healed  Onychomycosis with amputation hx -Nails debrided x9  Procedure: Nail  Debridement Rationale: Patient meets criteria for routine foot care due to amputation hx Type of Debridement: manual, sharp debridement. Instrumentation: Nail nipper, rotary burr. Number of Nails: 23  Pre-Ulcer Submet1 -Pared as below  Procedure: Paring of Lesion Rationale: painful hyperkeratotic lesion Type of Debridement: manual, sharp debridement. Instrumentation: 312 blade Number of Lesions: 1    Return in about 9 weeks (around 09/16/2019) for Diabetic Foot Care.

## 2019-09-22 ENCOUNTER — Other Ambulatory Visit: Payer: Self-pay | Admitting: Nurse Practitioner

## 2019-09-22 DIAGNOSIS — N1330 Unspecified hydronephrosis: Secondary | ICD-10-CM

## 2019-09-29 ENCOUNTER — Ambulatory Visit
Admission: RE | Admit: 2019-09-29 | Discharge: 2019-09-29 | Disposition: A | Discharge status: Home / Self Care | Payer: Medicare Other | Source: Ambulatory Visit | Attending: Nurse Practitioner | Admitting: Nurse Practitioner

## 2019-09-29 DIAGNOSIS — N1330 Unspecified hydronephrosis: Secondary | ICD-10-CM

## 2019-09-30 ENCOUNTER — Ambulatory Visit: Payer: Medicare Other | Admitting: Podiatry

## 2019-10-07 ENCOUNTER — Other Ambulatory Visit: Payer: Self-pay

## 2019-10-07 ENCOUNTER — Ambulatory Visit (INDEPENDENT_AMBULATORY_CARE_PROVIDER_SITE_OTHER): Payer: Medicare Other | Admitting: Podiatry

## 2019-10-07 DIAGNOSIS — E1169 Type 2 diabetes mellitus with other specified complication: Secondary | ICD-10-CM | POA: Diagnosis not present

## 2019-10-07 DIAGNOSIS — B351 Tinea unguium: Secondary | ICD-10-CM

## 2019-10-07 DIAGNOSIS — E1142 Type 2 diabetes mellitus with diabetic polyneuropathy: Secondary | ICD-10-CM | POA: Diagnosis not present

## 2019-10-07 NOTE — Progress Notes (Signed)
Subjective:  Patient ID: Karen [name], female    DOB: Oct 07, 1976,  MRN: [id number]  Chief Complaint  Patient presents with  . Foot Pain    pt is here for a f/u on routine foot care of the left foot, pt states that she a has a bruise on top of her left foot, that she would like to have it looked at.   43 y.o. female presents with the above complaint. Hx as above.  Review of Systems: Negative except as noted in the HPI. Denies N/V/F/Ch.  Past Medical History:  Diagnosis Date  . Diabetes mellitus without complication ([place])   . Hypertension   . Osteomyelitis ([place])    RIGHT FOOT FIFTH TOE  . Renal disorder     Current Outpatient Medications:  .  albuterol (ACCUNEB) 0.63 MG/3ML nebulizer solution, Take 3 mLs (0.63 mg dose) by nebulization every 6 (six) hours as needed for Wheezing., Disp: , Rfl:  .  amLODipine (NORVASC) 10 MG tablet, Take 1 tablet (10 mg total) by mouth daily., Disp: 30 tablet, Rfl: 0 .  amoxicillin-clavulanate (AUGMENTIN) 250-125 MG tablet, TAKE ONE TABLET BY MOUTH DAILY FOR 10 DAYS., Disp: , Rfl:  .  B Complex-C-Folic Acid (RENA-VITE PO), Take 1 tablet by mouth daily. , Disp: , Rfl:  .  calcium acetate (PHOSLO) 667 MG capsule, Take 1,334 mg by mouth 3 (three) times daily with meals. , Disp: , Rfl:  .  Cholecalciferol (VITAMIN D-1000 MAX ST) 1000 units tablet, Take 1,000 Units by mouth daily. , Disp: , Rfl:  .  ciprofloxacin (CIPRO) 250 MG tablet, Take 250 mg by mouth daily., Disp: , Rfl:  .  clindamycin (CLEOCIN) 300 MG capsule, Take 1 capsule (300 mg total) by mouth 2 (two) times daily., Disp: 20 capsule, Rfl: 0 .  colestipol (COLESTID) 1 g tablet, Take by mouth., Disp: , Rfl:  .  collagenase (SANTYL) ointment, Apply 1 application topically daily., Disp: 15 g, Rfl: 5 .  cyclobenzaprine (FLEXERIL) 10 MG tablet, TAKE 1 TABLET BY MOUTH 3 TIMES A DAY AS NEEDED FOR MUSCLE SPASMS FOR 10 DAYS, Disp: , Rfl: 0 .  diclofenac sodium (VOLTAREN) 1 % GEL, Place onto the  skin., Disp: , Rfl:  .  diclofenac sodium (VOLTAREN) 1 % GEL, PLACE ONTO THE SKIN 4 (FOUR) TIMES A DAY AS NEEDED., Disp: , Rfl: 1 .  diphenhydrAMINE (BENADRYL) 25 MG tablet, Take 25 mg by mouth daily as needed (seasonal allergies)., Disp: , Rfl:  .  doxycycline (VIBRAMYCIN) 100 MG capsule, TAKE ONE CAPSULE (100 MG DOSE) BY MOUTH 2 (TWO) TIMES DAILY FOR 10 DAYS., Disp: , Rfl: 0 .  erythromycin ophthalmic ointment, APPLY SMALL THIN LAYER TO UPPER AND LOWER EYE LIDS., Disp: , Rfl: 0 .  ethyl chloride spray, See admin instructions., Disp: , Rfl: 2 .  famotidine (PEPCID) 20 MG tablet, Take 20 mg by mouth See admin instructions. Take one tablet (20 mg) by mouth every morning, take an additional tablet (20 mg) at night if needed for heartburn, Disp: , Rfl:  .  fluconazole (DIFLUCAN) 150 MG tablet, TAKE 1 TABLET BY MOUTH FOR 1 DOSE, Disp: , Rfl:  .  fluticasone (FLONASE) 50 MCG/ACT nasal spray, Place 1-2 sprays into both nostrils daily., Disp: 16 g, Rfl: 0 .  FOSRENOL 1000 MG PACK, , Disp: , Rfl:  .  gabapentin (NEURONTIN) 100 MG capsule, Take 100 mg by mouth 3 (three) times daily. , Disp: , Rfl:  .  glucagon 1 MG injection, Inject  into the skin., Disp: , Rfl:  .  hydrALAZINE (APRESOLINE) 100 MG tablet, Take 100 mg by mouth every 8 (eight) hours. , Disp: , Rfl:  .  insulin aspart (NOVOLOG FLEXPEN) 100 UNIT/ML FlexPen, Inject 2-6 Units into the skin 3 (three) times daily as needed for high blood sugar (CBG>100)., Disp: , Rfl:  .  Insulin Glargine (LANTUS SOLOSTAR) 100 UNIT/ML Solostar Pen, Inject 10 Units into the skin at bedtime., Disp: , Rfl:  .  labetalol (NORMODYNE) 200 MG tablet, Take 400 mg by mouth every 8 (eight) hours. , Disp: , Rfl:  .  loperamide (IMODIUM) 2 MG capsule, , Disp: , Rfl:  .  LORazepam (ATIVAN) 0.5 MG tablet, Take 0.5 mg by mouth See admin instructions. Take one tablet (0.5 mg) by mouth 30 minutes prior dialysis on Monday, Wednesday, Friday, Disp: , Rfl:  .  losartan (COZAAR) 100 MG  tablet, Take 1 tablet (100 mg total) by mouth daily., Disp: 30 tablet, Rfl: 0 .  Methoxy PEG-Epoetin Beta 100 MCG/0.3ML SOSY, Inject as directed., Disp: , Rfl:  .  mometasone (ELOCON) 0.1 % ointment, , Disp: , Rfl:  .  nicotine (NICODERM CQ - DOSED IN MG/24 HOURS) 14 mg/24hr patch, Place onto the skin., Disp: , Rfl:  .  polyvinyl alcohol (ARTIFICIAL TEARS) 1.4 % ophthalmic solution, Place 1 drop into both eyes 2 (two) times daily., Disp: , Rfl:  .  Respiratory Therapy Supplies (NEBULIZER) DEVI, Use as directed every 4-6 hours as needed, Disp: , Rfl:  .  trimethoprim-polymyxin b (POLYTRIM) ophthalmic solution, Place one drop into both eyes every 4 (four) hours for 7 days., Disp: , Rfl:  .  trimethoprim-polymyxin b (POLYTRIM) ophthalmic solution, PLACE ONE DROP INTO BOTH EYES EVERY 4 (FOUR) HOURS FOR 7 DAYS., Disp: , Rfl: 0  Social History   Tobacco Use  Smoking Status Current Some Day Smoker  . Years: 25.00  . Types: Cigarettes  Smokeless Tobacco Never Used    Allergies  Allergen Reactions  . Heparin Other (See Comments)    Patient relates vitreous hemorrhage after heparin. Patient relates vitreous hemorrhage after heparin.   Objective:  There were no vitals filed for this visit. There is no height or weight on file to calculate BMI. Constitutional no acute distress  Vascular normal  Neurologic normal  Dermatologic No open wounds bilateral feet. Nails thickened and dystrophic bilat  Orthopedic: Amputation right 5th toe and partial metatarsal noted   Radiographs: none Assessment:   1. Onychomycosis of multiple toenails with type 2 diabetes mellitus and peripheral neuropathy ([place])    Plan:  Patient was evaluated and treated and all questions answered.  Ulcer left hallux -healed  Onychomycosis with amputation hx -Nails debrided  Procedure: Nail Debridement Rationale: Patient meets criteria for routine foot care due to Amputation hx, DPN Type of Debridement: manual, sharp  debridement. Instrumentation: Nail nipper, rotary burr. Number of Nails: 9  No follow-ups on file.

## 2020-01-06 ENCOUNTER — Ambulatory Visit (INDEPENDENT_AMBULATORY_CARE_PROVIDER_SITE_OTHER): Payer: Medicare HMO | Admitting: Podiatry

## 2020-01-06 ENCOUNTER — Other Ambulatory Visit: Payer: Self-pay

## 2020-01-06 DIAGNOSIS — E1142 Type 2 diabetes mellitus with diabetic polyneuropathy: Secondary | ICD-10-CM

## 2020-01-06 DIAGNOSIS — E1169 Type 2 diabetes mellitus with other specified complication: Secondary | ICD-10-CM | POA: Diagnosis not present

## 2020-01-06 DIAGNOSIS — B351 Tinea unguium: Secondary | ICD-10-CM

## 2020-01-06 NOTE — Progress Notes (Signed)
  Subjective:  Patient ID: Oneida Alar, female    DOB: Jun 13, 1976,  MRN: [id number]  Chief Complaint  Patient presents with  . Nail Problem    pt is here for routine foot care, pt is also a diabetic type 2    44 y.o. female presents with the above complaint. History confirmed with patient.   Objective:  Physical Exam: warm, good capillary refill, nail exam normal nails without lesions, no trophic changes or ulcerative lesions, normal DP and PT pulses, and absent protective sensation Left Foot: normal exam, no swelling, tenderness, instability; ligaments intact, full range of motion of all ankle/foot joints  Right Foot: History of right fifth toe amputation No images are attached to the encounter.  Assessment:   1. Onychomycosis of multiple toenails with type 2 diabetes mellitus and peripheral neuropathy ([place])      Plan:  Patient was evaluated and treated and all questions answered.  Onychomycosis and DPN -Patient is diabetic with a qualifying condition for at risk foot care.  Procedure: Nail Debridement Rationale: Patient meets criteria for routine foot care due to DPN Type of Debridement: manual, sharp debridement. Instrumentation: Nail nipper, rotary burr. Number of Nails: 10  No follow-ups on file.

## 2020-04-06 ENCOUNTER — Ambulatory Visit (INDEPENDENT_AMBULATORY_CARE_PROVIDER_SITE_OTHER): Payer: Medicare HMO | Admitting: Podiatry

## 2020-04-06 ENCOUNTER — Other Ambulatory Visit: Payer: Self-pay

## 2020-04-06 DIAGNOSIS — E1142 Type 2 diabetes mellitus with diabetic polyneuropathy: Secondary | ICD-10-CM

## 2020-04-06 DIAGNOSIS — E1169 Type 2 diabetes mellitus with other specified complication: Secondary | ICD-10-CM

## 2020-04-06 DIAGNOSIS — B351 Tinea unguium: Secondary | ICD-10-CM | POA: Diagnosis not present

## 2020-04-06 NOTE — Progress Notes (Signed)
  Subjective:  Patient ID: Karen [name], female    DOB: 05-30-1976,  MRN: [id number]  Chief Complaint  Patient presents with  . Nail Problem    Nail trim 1-5 bilateral   44 y.o. female presents with the above complaint. History confirmed with patient.   Objective:  Physical Exam: warm, good capillary refill, nail exam onychomycosis of the toenails, no trophic changes or ulcerative lesions, normal DP and PT pulses, and absent protective sensation   No images are attached to the encounter.  Assessment:   1. Onychomycosis of multiple toenails with type 2 diabetes mellitus and peripheral neuropathy ([place])    Plan:  Patient was evaluated and treated and all questions answered.  Diabetes and DPN -Patient is diabetic with a qualifying condition for at risk foot care.  Procedure: Nail Debridement Rationale: Patient meets criteria for routine foot care due to DPN Type of Debridement: manual, sharp debridement. Instrumentation: Nail nipper, rotary burr. Number of Nails: 10  Return in about 3 months (around 07/07/2020) for Diabetic Foot Care.

## 2020-07-28 ENCOUNTER — Other Ambulatory Visit: Payer: Self-pay

## 2020-07-28 ENCOUNTER — Ambulatory Visit (INDEPENDENT_AMBULATORY_CARE_PROVIDER_SITE_OTHER): Payer: Medicare HMO | Admitting: Podiatry

## 2020-07-28 DIAGNOSIS — Z5321 Procedure and treatment not carried out due to patient leaving prior to being seen by health care provider: Secondary | ICD-10-CM

## 2020-07-30 NOTE — Progress Notes (Signed)
Patient left without being seen.

## 2020-11-12 DIAGNOSIS — J129 Viral pneumonia, unspecified: Secondary | ICD-10-CM | POA: Insufficient documentation

## 2020-11-14 ENCOUNTER — Other Ambulatory Visit: Payer: Self-pay

## 2020-11-14 ENCOUNTER — Ambulatory Visit (INDEPENDENT_AMBULATORY_CARE_PROVIDER_SITE_OTHER): Payer: Medicare HMO | Admitting: Podiatry

## 2020-11-14 DIAGNOSIS — B351 Tinea unguium: Secondary | ICD-10-CM

## 2020-11-14 DIAGNOSIS — E1169 Type 2 diabetes mellitus with other specified complication: Secondary | ICD-10-CM

## 2020-11-14 DIAGNOSIS — E1142 Type 2 diabetes mellitus with diabetic polyneuropathy: Secondary | ICD-10-CM | POA: Diagnosis not present

## 2020-11-14 NOTE — Progress Notes (Signed)
  Subjective:  Patient ID: Karen [name], female    DOB: 04-20-1976,  MRN: [id number]  Chief Complaint  Patient presents with  . Nail Problem    Nail trim 1-5 bilateral   44 y.o. female presents with the above complaint. History confirmed with patient.   Objective:  Physical Exam: warm, good capillary refill, nail exam onychomycosis of the toenails, no trophic changes or ulcerative lesions, normal DP and PT pulses, and absent protective sensation   No images are attached to the encounter.  Assessment:   1. Onychomycosis of multiple toenails with type 2 diabetes mellitus and peripheral neuropathy ([place])    Plan:  Patient was evaluated and treated and all questions answered.  Diabetes and DPN -Patient is diabetic with a qualifying condition for at risk foot care.  Procedure: Nail Debridement Type of Debridement: manual, sharp debridement. Instrumentation: Nail nipper, rotary burr. Number of Nails: 10     Return in about 3 months (around 02/12/2021) for Diabetic Foot Care.

## 2021-02-13 ENCOUNTER — Ambulatory Visit (INDEPENDENT_AMBULATORY_CARE_PROVIDER_SITE_OTHER): Payer: Medicare HMO | Admitting: Podiatry

## 2021-02-13 ENCOUNTER — Other Ambulatory Visit: Payer: Self-pay

## 2021-02-13 DIAGNOSIS — B351 Tinea unguium: Secondary | ICD-10-CM

## 2021-02-13 DIAGNOSIS — E1169 Type 2 diabetes mellitus with other specified complication: Secondary | ICD-10-CM

## 2021-02-13 DIAGNOSIS — E1142 Type 2 diabetes mellitus with diabetic polyneuropathy: Secondary | ICD-10-CM | POA: Diagnosis not present

## 2021-02-13 DIAGNOSIS — L89896 Pressure-induced deep tissue damage of other site: Secondary | ICD-10-CM

## 2021-02-13 NOTE — Progress Notes (Signed)
  Subjective:  Patient ID: Karen [name], female    DOB: 09/24/1976,  MRN: [id number]  Chief Complaint  Patient presents with  . Nail Problem    RFC Nail trim 1-5 bilateral   45 y.o. female presents with the above complaint. History confirmed with patient.  Has no issues with spots that are red on the top of both feet.  Present for 2 weeks.  Thinks that it could be caused by her shoes. Objective:  Physical Exam: warm, good capillary refill, nail exam onychomycosis of the toenails, no trophic changes or ulcerative lesions, normal DP and PT pulses, and absent protective sensation Hx right 5th toe amputation  New dorsal symmetrical midfoot deep tissue injury No images are attached to the encounter.  Assessment:   1. Onychomycosis of multiple toenails with type 2 diabetes mellitus and peripheral neuropathy ([place])   2. Pressure injury of deep tissue of dorsum of left foot   3. Pressure injury of deep tissue of dorsum of right foot    Plan:  Patient was evaluated and treated and all questions answered.  Diabetes and DPN -Patient is diabetic with a qualifying condition for at risk foot care.  Procedure: Nail Debridement Type of Debridement: manual, sharp debridement. Instrumentation: Nail nipper, rotary burr. Number of Nails: 9  Dorsal foot pressure injury -Discussed this likely due to her shoes.  I told her to avoid wearing crocs and consider soft bedroom shoes for right now.  She thinks that these happen due to swelling in her feet.  Without continue pressure I think these will heal uneventfully but should these ulcerate she is to call promptly for appointment.       No follow-ups on file.

## 2021-05-15 ENCOUNTER — Ambulatory Visit: Payer: Medicare HMO | Admitting: Podiatry

## 2021-05-28 DIAGNOSIS — R262 Difficulty in walking, not elsewhere classified: Secondary | ICD-10-CM | POA: Insufficient documentation

## 2021-05-28 DIAGNOSIS — Z89421 Acquired absence of other right toe(s): Secondary | ICD-10-CM | POA: Insufficient documentation

## 2021-05-28 DIAGNOSIS — Z9181 History of falling: Secondary | ICD-10-CM | POA: Insufficient documentation

## 2021-05-28 DIAGNOSIS — R5381 Other malaise: Secondary | ICD-10-CM | POA: Insufficient documentation

## 2021-05-28 DIAGNOSIS — E1140 Type 2 diabetes mellitus with diabetic neuropathy, unspecified: Secondary | ICD-10-CM | POA: Insufficient documentation

## 2021-05-29 ENCOUNTER — Ambulatory Visit: Payer: Medicare HMO | Admitting: Podiatry

## 2021-06-05 ENCOUNTER — Other Ambulatory Visit: Payer: Self-pay

## 2021-06-05 ENCOUNTER — Ambulatory Visit (INDEPENDENT_AMBULATORY_CARE_PROVIDER_SITE_OTHER): Payer: Medicare HMO | Admitting: Podiatry

## 2021-06-05 DIAGNOSIS — E1142 Type 2 diabetes mellitus with diabetic polyneuropathy: Secondary | ICD-10-CM

## 2021-06-05 DIAGNOSIS — E1169 Type 2 diabetes mellitus with other specified complication: Secondary | ICD-10-CM

## 2021-06-05 DIAGNOSIS — B351 Tinea unguium: Secondary | ICD-10-CM | POA: Diagnosis not present

## 2021-06-05 NOTE — Progress Notes (Signed)
  Subjective:  Patient ID: Karen [name], female    DOB: February 12, 1976,  MRN: [id number]  Chief Complaint  Patient presents with   Diabetes   Nail Problem    Thick/painful nails    45 y.o. female presents with the above complaint. History confirmed with patient. Denies new known issues. Objective:  Physical Exam: warm, good capillary refill, nail exam onychomycosis of the toenails,  normal DP and PT pulses, and absent protective sensation Hx right 5th toe amputation  Deep tissue injury bilat appears healed. Possible 3rd toe ingrown nail with abrasion along the medial nail border without warmth, erythema, SOI.  Assessment:   1. Onychomycosis of multiple toenails with type 2 diabetes mellitus and peripheral neuropathy ([place])     Plan:  Patient was evaluated and treated and all questions answered.  Diabetes and DPN -Patient is diabetic with a qualifying condition for at risk foot care.   Procedure: Nail Debridement Type of Debridement: manual, sharp debridement. Instrumentation: Nail nipper, rotary burr. Number of Nails: 10   ?Ingrown nail right 3rd toe -Nail debrided, no full ingrown nail noted. Could be abrasion. -Dress with abx ointment and band-aid. Dress as such daily.       No follow-ups on file.

## 2021-06-26 DIAGNOSIS — N838 Other noninflammatory disorders of ovary, fallopian tube and broad ligament: Secondary | ICD-10-CM

## 2021-06-26 HISTORY — DX: Other noninflammatory disorders of ovary, fallopian tube and broad ligament: N83.8

## 2021-07-13 DIAGNOSIS — R159 Full incontinence of feces: Secondary | ICD-10-CM | POA: Insufficient documentation

## 2021-07-13 DIAGNOSIS — K580 Irritable bowel syndrome with diarrhea: Secondary | ICD-10-CM | POA: Insufficient documentation

## 2021-09-04 ENCOUNTER — Ambulatory Visit: Payer: Medicare HMO | Admitting: Podiatry

## 2021-09-11 ENCOUNTER — Ambulatory Visit: Payer: Medicare HMO | Admitting: Podiatry

## 2021-09-14 ENCOUNTER — Ambulatory Visit: Payer: Medicare HMO | Admitting: Podiatry

## 2021-10-09 ENCOUNTER — Other Ambulatory Visit: Payer: Self-pay

## 2021-10-09 ENCOUNTER — Ambulatory Visit (INDEPENDENT_AMBULATORY_CARE_PROVIDER_SITE_OTHER): Payer: Medicare HMO | Admitting: Podiatry

## 2021-10-09 DIAGNOSIS — E1142 Type 2 diabetes mellitus with diabetic polyneuropathy: Secondary | ICD-10-CM

## 2021-10-09 DIAGNOSIS — E1169 Type 2 diabetes mellitus with other specified complication: Secondary | ICD-10-CM | POA: Diagnosis not present

## 2021-10-09 DIAGNOSIS — B351 Tinea unguium: Secondary | ICD-10-CM | POA: Diagnosis not present

## 2021-10-09 NOTE — Progress Notes (Signed)
  Subjective:  Patient ID: Karen [name], female    DOB: 1976-02-05,  MRN: [id number]  Chief Complaint  Patient presents with   Nail Problem    Diabetic foot care    45 y.o. female presents with the above complaint. History confirmed with patient. States that she has a dark spot on the right great toe, thinks the nail might be ingrown, started 2-3 weeks ago. Thought it was getting better so she ignored it. Objective:  Physical Exam: warm, good capillary refill, nail exam onychomycosis of the toenails,  normal DP and PT pulses, and absent protective sensation Hx right 5th toe amputation  Right great toe medial nail mild ingrown nail with thickened skin with dried blood but no open wound  Assessment:   1. Onychomycosis of multiple toenails with type 2 diabetes mellitus and peripheral neuropathy ([place])     Plan:  Patient was evaluated and treated and all questions answered.  Diabetes and DPN -Patient is diabetic with a qualifying condition for at risk foot care.  Procedure: Nail Debridement Type of Debridement: manual, sharp debridement. Instrumentation: Nail nipper, rotary burr. Number of Nails: 9   Right hallux ingrown nail -Debrided without open lesion noted.  Return in about 3 months (around 01/09/2022) for Diabetic Foot Care.

## 2021-11-05 ENCOUNTER — Other Ambulatory Visit: Payer: Self-pay

## 2021-11-05 ENCOUNTER — Encounter (HOSPITAL_COMMUNITY): Payer: Self-pay | Admitting: Emergency Medicine

## 2021-11-05 ENCOUNTER — Emergency Department (HOSPITAL_COMMUNITY)
Admission: EM | Admit: 2021-11-05 | Discharge: 2021-11-06 | Disposition: A | Discharge status: Home / Self Care | Payer: Medicare HMO | Attending: Emergency Medicine | Admitting: Emergency Medicine

## 2021-11-05 DIAGNOSIS — Z79899 Other long term (current) drug therapy: Secondary | ICD-10-CM | POA: Diagnosis not present

## 2021-11-05 DIAGNOSIS — N186 End stage renal disease: Secondary | ICD-10-CM | POA: Insufficient documentation

## 2021-11-05 DIAGNOSIS — J45909 Unspecified asthma, uncomplicated: Secondary | ICD-10-CM | POA: Diagnosis not present

## 2021-11-05 DIAGNOSIS — E1122 Type 2 diabetes mellitus with diabetic chronic kidney disease: Secondary | ICD-10-CM | POA: Diagnosis not present

## 2021-11-05 DIAGNOSIS — Z992 Dependence on renal dialysis: Secondary | ICD-10-CM | POA: Diagnosis not present

## 2021-11-05 DIAGNOSIS — R531 Weakness: Secondary | ICD-10-CM | POA: Diagnosis not present

## 2021-11-05 DIAGNOSIS — F17210 Nicotine dependence, cigarettes, uncomplicated: Secondary | ICD-10-CM | POA: Insufficient documentation

## 2021-11-05 DIAGNOSIS — R1030 Lower abdominal pain, unspecified: Secondary | ICD-10-CM | POA: Diagnosis not present

## 2021-11-05 DIAGNOSIS — M7918 Myalgia, other site: Secondary | ICD-10-CM | POA: Insufficient documentation

## 2021-11-05 DIAGNOSIS — I120 Hypertensive chronic kidney disease with stage 5 chronic kidney disease or end stage renal disease: Secondary | ICD-10-CM | POA: Insufficient documentation

## 2021-11-05 DIAGNOSIS — Z794 Long term (current) use of insulin: Secondary | ICD-10-CM | POA: Diagnosis not present

## 2021-11-05 DIAGNOSIS — R110 Nausea: Secondary | ICD-10-CM | POA: Insufficient documentation

## 2021-11-05 DIAGNOSIS — Z7952 Long term (current) use of systemic steroids: Secondary | ICD-10-CM | POA: Diagnosis not present

## 2021-11-05 DIAGNOSIS — Z20822 Contact with and (suspected) exposure to covid-19: Secondary | ICD-10-CM | POA: Insufficient documentation

## 2021-11-05 DIAGNOSIS — M791 Myalgia, unspecified site: Secondary | ICD-10-CM

## 2021-11-05 NOTE — ED Provider Notes (Signed)
Emergency Medicine Provider Triage Evaluation Note  Karen [name] , a 45 y.o. female  was evaluated in triage.  Pt complains of weakness that began today. Generally weak with body aches, chills, and intermittent sharp chest pain that lasts < 1 minute at a time. Sometimes feels lightheaded like shes going to pass out, but no syncope. Also notes diarrhea. Missed dialysis today.   Review of Systems  Positive: Chest pain, weakness, lightheadedness, myalgias, chills, diarrhea Negative: Dyspnea, cough, vomiting  Physical Exam  BP (!) 164/76   Pulse 90   Temp 98.6 F (37 C) (Oral)   Resp 18   Ht 5\' 5"  (1.651 m)   Wt 83.9 kg   SpO2 97%   BMI 30.79 kg/m  Gen:   Awake, no distress   Resp:  Normal effort  MSK:   Moves extremities without difficulty  Other:  No focal abdominal tenderness.   Medical Decision Making  Medically screening exam initiated at 11:57 PM.  Appropriate orders placed.  [facility] was informed that the remainder of the evaluation will be completed by another provider, this initial triage assessment does not replace that evaluation, and the importance of remaining in the ED until their evaluation is complete.  Weakness.    Karen Dyke, PA-C 11/05/21 2359    Merryl Hacker, MD 11/06/21 [phone]

## 2021-11-05 NOTE — ED Triage Notes (Signed)
Pt presents to ED POV. Pt c/o CP, weakness, generalized pain, and dizziness that began today. Pt reports that she could not go to dialysis today d/t s/s. M/w/f dialysis pt.

## 2021-11-06 ENCOUNTER — Emergency Department (HOSPITAL_COMMUNITY): Payer: Medicare HMO

## 2021-11-06 DIAGNOSIS — M7918 Myalgia, other site: Secondary | ICD-10-CM | POA: Diagnosis not present

## 2021-11-06 LAB — COMPREHENSIVE METABOLIC PANEL
ALT: 42 U/L (ref 0–44)
AST: 29 U/L (ref 15–41)
Albumin: 3.4 g/dL — ABNORMAL LOW (ref 3.5–5.0)
Alkaline Phosphatase: 227 U/L — ABNORMAL HIGH (ref 38–126)
Anion gap: 19 — ABNORMAL HIGH (ref 5–15)
BUN: 86 mg/dL — ABNORMAL HIGH (ref 6–20)
CO2: 19 mmol/L — ABNORMAL LOW (ref 22–32)
Calcium: 8.3 mg/dL — ABNORMAL LOW (ref 8.9–10.3)
Chloride: 98 mmol/L (ref 98–111)
Creatinine, Ser: 9.8 mg/dL — ABNORMAL HIGH (ref 0.44–1.00)
GFR, Estimated: 5 mL/min — ABNORMAL LOW (ref >60)
Glucose, Bld: 292 mg/dL — ABNORMAL HIGH (ref 70–99)
Potassium: 5.5 mmol/L — ABNORMAL HIGH (ref 3.5–5.1)
Sodium: 136 mmol/L (ref 135–145)
Total Bilirubin: 0.6 mg/dL (ref 0.3–1.2)
Total Protein: 6.6 g/dL (ref 6.5–8.1)

## 2021-11-06 LAB — CBC
HCT: 37.6 % (ref 36.0–46.0)
Hemoglobin: 12.4 g/dL (ref 12.0–15.0)
MCH: 32.1 pg (ref 26.0–34.0)
MCHC: 33 g/dL (ref 30.0–36.0)
MCV: 97.4 fL (ref 80.0–100.0)
Platelets: 165 10*3/uL (ref 150–400)
RBC: 3.86 MIL/uL — ABNORMAL LOW (ref 3.87–5.11)
RDW: 13.4 % (ref 11.5–15.5)
WBC: 10.8 10*3/uL — ABNORMAL HIGH (ref 4.0–10.5)
nRBC: 0 % (ref 0.0–0.2)

## 2021-11-06 LAB — TROPONIN I (HIGH SENSITIVITY)
Troponin I (High Sensitivity): 10 ng/L (ref <18)
Troponin I (High Sensitivity): 9 ng/L (ref <18)

## 2021-11-06 LAB — RESP PANEL BY RT-PCR (FLU A&B, COVID) ARPGX2
Influenza A by PCR: NEGATIVE
Influenza B by PCR: NEGATIVE
SARS Coronavirus 2 by RT PCR: NEGATIVE

## 2021-11-06 MED ORDER — ACETAMINOPHEN 325 MG PO TABS
ORAL_TABLET | ORAL | Status: AC
  Filled 2021-11-06: qty 1

## 2021-11-06 MED ORDER — OXYCODONE HCL 5 MG PO TABS
5.0000 mg | ORAL_TABLET | Freq: Once | ORAL | Status: AC
  Administered 2021-11-06: 5 mg via ORAL
  Filled 2021-11-06: qty 1

## 2021-11-06 MED ORDER — ACETAMINOPHEN 325 MG PO TABS
650.0000 mg | ORAL_TABLET | Freq: Once | ORAL | Status: AC
  Administered 2021-11-06: 650 mg via ORAL

## 2021-11-06 NOTE — ED Provider Notes (Signed)
[facility] EMERGENCY DEPARTMENT Provider Note   CSN: [id number] Arrival date & time: 11/05/21  2229     History Chief Complaint  Patient presents with   Weakness    Karen [name] is a 45 y.o. female.  The history is provided by the patient and medical records.  Weakness Karen [name] is a 45 y.o. female who presents to the Emergency Department complaining of weakness.  She presents to the ED for evaluation of generalized weakness and diffuse body aches.  She has ESRD on HD and could not go to dialysis on Monday due to feeling so poorly.  Took home flu and covid tests, which were negative.   No fever.  Has some sharp, central chest pain, just a few episodes.  No sob.  No cough, sore throat.  Has nausea, no vomiting.  Has occasional mild lower abdominal pain (chronic, unchanged).  Anuric.  No dysuria. She did burn her hand several days ago on grease. No additional wounds. She dialyzes via a fistula in the left upper extremity.    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[place]) 05/28/2021   Risk for falls 05/28/2021   Viral pneumonia 11/12/2020   Encounter for removal of sutures 06/29/2019   Diarrhea, unspecified 05/05/2019   Mild intermittent asthma without complication 36/46/8032   Headache, unspecified 12/25/2018   Pruritus, unspecified 11/27/2018   Encounter for immunization 08/19/2018   Chronic low back pain without sciatica 08/13/2018   Mild protein-calorie malnutrition ([place]) 07/02/2018   Diabetic foot infection ([place]) 06/07/2018   Essential hypertension    Chronic pancreatitis  ([place]) 05/12/2018   Vitamin D deficiency 05/12/2018   ESRD on hemodialysis ([place]) 05/07/2018   History of recurrent UTIs 05/07/2018   IDDM (insulin dependent diabetes mellitus) 05/07/2018   Shortness of breath 05/07/2018   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   End stage renal disease ([place]) 04/28/2018   History of nephrotic syndrome 04/28/2018   Iron deficiency anemia, unspecified 04/28/2018   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   History of endocarditis 01/16/2018   History of MRSA infection 01/16/2018   Bilateral pleural effusion 10/10/2017   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;  Service: Vascular;  Laterality: Right;   APPENDECTOMY     CHOLECYSTECTOMY       OB History   No obstetric history on file.     History reviewed. No pertinent family history.  Social History   Tobacco Use   Smoking status: Some Days    Years: 25.00    Types: Cigarettes   Smokeless tobacco: Never  Vaping Use   Vaping Use: Never used  Substance Use Topics  Alcohol use: Not Currently   Drug use: Yes    Types: Marijuana    Home Medications Prior to Admission medications   Medication Sig Start Date End Date Taking? Authorizing Provider  albuterol (ACCUNEB) 0.63 MG/3ML nebulizer solution Take 3 mLs (0.63 mg dose) by nebulization every 6 (six) hours as needed for Wheezing. 01/19/19   [provider]  amLODipine (NORVASC) 10 MG tablet Take 1 tablet (10 mg total) by mouth daily. 06/12/18    Bonnielee Haff, MD  amoxicillin-clavulanate (AUGMENTIN) 250-125 MG tablet TAKE ONE TABLET BY MOUTH DAILY FOR 10 DAYS. 01/20/19   [provider]  B Complex-C-Folic Acid (RENA-VITE PO) Take 1 tablet by mouth daily.     [provider]  Blood Glucose Monitoring Suppl (ACCU-CHEK AVIVA PLUS) w/Device KIT by Does not apply route. 12/09/19   [provider]  calcium acetate (PHOSLO) 667 MG capsule Take 1,334 mg by mouth 3 (three) times daily with meals.     [provider]  Cholecalciferol 25 MCG (1000 UT) tablet Take 1,000 Units by mouth daily.     [provider]  cholestyramine Lucrezia Starch) 4 g packet  08/04/20   [provider]  cinacalcet (SENSIPAR) 60 MG tablet Take by mouth.    [provider]  ciprofloxacin (CIPRO) 250 MG tablet Take 250 mg by mouth daily. 09/17/19   [provider]  clindamycin (CLEOCIN) 300 MG capsule Take 1 capsule (300 mg total) by mouth 2 (two) times daily. 02/23/19   Evelina Bucy, DPM  clobetasol (TEMOVATE) 0.05 % external solution Apply to scalp 1-2 times daily prn 04/03/21 04/03/22  [provider]  colestipol (COLESTID) 1 g tablet Take by mouth. 11/05/18   [provider]  collagenase (SANTYL) ointment Apply 1 application topically daily. 05/07/18   Evelina Bucy, DPM  cyclobenzaprine (FLEXERIL) 10 MG tablet TAKE 1 TABLET BY MOUTH 3 TIMES A DAY AS NEEDED FOR MUSCLE SPASMS FOR 10 DAYS 10/07/18   [provider]  diclofenac sodium (VOLTAREN) 1 % GEL Place onto the skin. 10/07/18   [provider]  diclofenac sodium (VOLTAREN) 1 % GEL PLACE ONTO THE SKIN 4 (FOUR) TIMES A DAY AS NEEDED. 10/07/18   [provider]  dicyclomine (BENTYL) 20 MG tablet  05/19/20   [provider]  dicyclomine (BENTYL) 20 MG tablet Take by mouth. 05/19/20   [provider]  diphenhydrAMINE (BENADRYL) 25 MG tablet Take 25 mg by mouth daily as needed (seasonal allergies).     [provider]  doxycycline (VIBRAMYCIN) 100 MG capsule TAKE ONE CAPSULE (100 MG DOSE) BY MOUTH 2 (TWO) TIMES DAILY FOR 10 DAYS. 09/25/18   [provider]  erythromycin ophthalmic ointment APPLY SMALL THIN LAYER TO UPPER AND LOWER EYE LIDS. 08/13/18   [provider]  erythromycin with ethanol [facility]) 2 % external solution Apply thin layer to acne prone areas in the morning 04/12/21   [provider]  ethyl chloride spray See admin instructions. 09/29/18   [provider]  famotidine (PEPCID) 20 MG tablet Take 20 mg by mouth See admin instructions. Take one tablet (20 mg) by mouth every morning, take an additional tablet (20 mg) at night if needed for heartburn    [provider]  fluconazole (DIFLUCAN) 150 MG tablet TAKE 1 TABLET BY MOUTH FOR 1 DOSE 01/25/19   [provider]  fluticasone (FLONASE) 50 MCG/ACT nasal spray Place 1-2 sprays into both nostrils daily. 11/20/18   Antonietta Breach, PA-C  FOSRENOL 1000 MG PACK  01/12/19   [provider]  gabapentin (NEURONTIN) 100 MG capsule Take 100 mg by mouth 3 (three) times daily.  01/26/18   [provider]  gabapentin (NEURONTIN) 100 MG capsule Take by mouth. 03/12/21   [provider]  glucagon 1 MG injection Inject into the skin. 06/16/18   [provider]  glucose blood (ACCU-CHEK AVIVA PLUS) test strip USE TO MONITOR BLOOD GLUCOSE 4 TIME(S) DAILY 11/12/19   [provider]  hydrALAZINE (APRESOLINE) 100 MG tablet Take 100 mg by mouth every 8 (eight) hours.  10/24/17   [provider]  hydrALAZINE (APRESOLINE) 100 MG tablet Take by mouth.    [provider]  insulin aspart (NOVOLOG) 100 UNIT/ML FlexPen Inject 2-6 Units into the skin 3 (three) times daily as needed for high blood sugar (CBG>100).    [provider]  insulin glargine (LANTUS) 100 UNIT/ML Solostar Pen Inject 10 Units into the skin at bedtime.    [provider]  Insulin Pen Needle (BD PEN NEEDLE NANO U/F) 32G X 4 MM MISC Use with insulin 4x/day 12/09/19   [provider]  ketoconazole (NIZORAL) 2 % shampoo Apply topically. 04/05/21   [provider]  ketorolac (ACULAR) 0.4 % SOLN  03/29/20   [provider]  labetalol (NORMODYNE) 200 MG tablet Take 400 mg by mouth every 8 (eight) hours.     [provider]  labetalol (NORMODYNE) 200 MG tablet Take by mouth.    [provider]  loperamide (IMODIUM) 2 MG capsule  10/15/18   [provider]  LORazepam (ATIVAN) 0.5 MG tablet Take 0.5 mg by mouth See admin instructions. Take one tablet (0.5 mg) by mouth 30 minutes prior dialysis on Monday, Wednesday, Friday 05/12/18   [provider]  losartan (COZAAR) 100 MG tablet Take 1 tablet (100 mg total) by mouth daily. 06/12/18   Bonnielee Haff, MD  Methoxy PEG-Epoetin Beta 100 MCG/0.3ML SOSY Inject as directed.    [provider]  Misc. Devices MISC Dispense 1 rollator with seat and adjustable height. Dx:R26.2, R6821001, R53.81, Z91.81 05/28/21   [provider]  mometasone (ELOCON) 0.1 % ointment  10/14/18   [provider]  nicotine (NICODERM CQ - DOSED IN MG/24 HOURS) 14 mg/24hr patch Place onto the skin. 06/18/19   [provider]  ofloxacin (OCUFLOX) 0.3 % ophthalmic solution  03/29/20   [provider]  oxyCODONE-acetaminophen (PERCOCET/ROXICET) 5-325 MG tablet Take 1 tablet by mouth every 6 (six) hours as needed. 03/28/21   [provider]  pantoprazole (PROTONIX) 40 MG tablet Take by mouth. 05/18/20 05/18/21  [provider]  polyvinyl alcohol (LIQUIFILM TEARS) 1.4 % ophthalmic solution Place 1 drop into both eyes 2 (two) times daily.    [provider]  prednisoLONE acetate (PRED FORTE) 1 % ophthalmic suspension  03/29/20   [provider]  Respiratory Therapy Supplies (NEBULIZER) DEVI Use as directed every 4-6 hours  as needed 01/19/19   [provider]  tretinoin (RETIN-A) 0.025 % cream Apply pea size amount to face nightly 04/03/21 04/03/22  [provider]  trimethoprim-polymyxin b (POLYTRIM) ophthalmic solution Place one drop into both eyes every 4 (four) hours for 7 days. 09/16/18   [provider]  trimethoprim-polymyxin b (POLYTRIM) ophthalmic solution PLACE ONE DROP INTO BOTH EYES EVERY 4 (FOUR) HOURS FOR 7 DAYS. 09/16/18   [provider]    Allergies    Furosemide, Heparin, and Insulin aspart  Review of Systems   Review of Systems  Neurological:  Positive for weakness.  All other systems reviewed and are negative.  Physical Exam Updated Vital Signs BP 128/61 (BP Location: Right Arm)   Pulse 84   Temp 98 F (36.7 C) (Oral)   Resp 18   Ht _0  (1.651 m)   Wt 83.9 kg   SpO2 98%   BMI 30.79 kg/m   Physical Exam Vitals and nursing note reviewed.  Constitutional:      Appearance: She is well-developed.  HENT:     Head: Normocephalic and atraumatic.  Cardiovascular:     Rate and Rhythm: Normal rate and regular rhythm.     Heart sounds: No murmur heard. Pulmonary:     Effort: Pulmonary effort is normal. No respiratory distress.     Breath sounds: Normal breath sounds.  Abdominal:     Palpations: Abdomen is soft.     Tenderness: There is no abdominal tenderness. There is no guarding or rebound.  Musculoskeletal:        General: No tenderness.     Comments: 1+ edema to BLE.  No open wounds to the feet.    Skin:    General: Skin is warm and dry.  Neurological:     Mental Status: She is alert and oriented to person, place, and time.     Comments: 5/5 strength in all four extremities.    Psychiatric:        Behavior: Behavior normal.    ED Results / Procedures / Treatments   Labs (all labs ordered are listed, but only abnormal results are displayed) Labs Reviewed  COMPREHENSIVE METABOLIC PANEL - Abnormal; Notable for the following components:       Result Value   Potassium 5.5 (*)    CO2 19 (*)    Glucose, Bld 292 (*)    BUN 86 (*)    Creatinine, Ser 9.80 (*)    Calcium 8.3 (*)    Albumin 3.4 (*)    Alkaline Phosphatase 227 (*)    GFR, Estimated 5 (*)    Anion gap 19 (*)    All other components within normal limits  CBC - Abnormal; Notable for the following components:   WBC 10.8 (*)    RBC 3.86 (*)    All other components within normal limits  RESP PANEL BY RT-PCR (FLU A&B, COVID) ARPGX2  CULTURE, BLOOD (ROUTINE X 2)  CULTURE, BLOOD (ROUTINE X 2)  TROPONIN I (HIGH SENSITIVITY)  TROPONIN I (HIGH SENSITIVITY)    EKG EKG Interpretation  Date/Time:  Monday November 05 2021 23:51:18 EST Ventricular Rate:  87 PR Interval:  178 QRS Duration: 88 QT Interval:  380 QTC Calculation: 457 R Axis:   106 Text Interpretation: Normal sinus rhythm Rightward axis Borderline ECG No previous trace Confirmed by Quintella Reichert [phone]) on 11/06/2021 4:12:39 AM  Radiology DG Chest 2 View  Result Date: 11/06/2021 CLINICAL DATA:  Chest pain. EXAM: CHEST - 2 VIEW COMPARISON:  None. FINDINGS: Mild cardiomegaly with mild central vascular prominence. No focal consolidation, pleural effusion, or pneumothorax. No acute osseous pathology. IMPRESSION: Mild cardiomegaly with mild central vascular prominence. No focal consolidation. Electronically Signed   By: Anner Crete M.D.   On: 11/06/2021 00:29    Procedures Procedures   Medications Ordered in ED Medications  acetaminophen (TYLENOL) tablet 650 mg (650 mg Oral Given 11/06/21 0316)  oxyCODONE (Oxy IR/ROXICODONE) immediate release tablet 5 mg (5 mg Oral Given 11/06/21 0541)  ED Course  I have reviewed the triage vital signs and the nursing notes.  Pertinent labs & imaging results that were available during my care of the patient were reviewed by me and considered in my medical decision making (see chart for details).    MDM Rules/Calculators/A&P                           patient with type II diabetes, ESR D on dialysis here for evaluation of body aches and generalized weakness. On evaluation she is non-toxic appearing with no respiratory distress. No evidence of COVID or influenza. CBC with mild leukocytosis. BMP with minimal hyperkalemia, EKG without acute changes. Mild elevation and glucose. Alk phos is elevated and this is at her baseline. Her bicarb is mildly decreased at 19 but on record review she has been in this range in the past. Current presentation is not consistent with DKA, sepsis. There is no clear source of acute bacterial infection. Discussed with patient unclear source of her symptoms at this time. She has scheduled a makeup dialysis session for 11 o'clock today and feel that she is stable to discharge to dialysis to complete her session. Will send blood cultures to evaluate for occult bacteremia that may be driving her symptoms. Discussed home care, outpatient follow-up and return precautions.  Final Clinical Impression(s) / ED Diagnoses Final diagnoses:  Myalgia    Rx / DC Orders ED Discharge Orders     None        Quintella Reichert, MD 11/06/21 [phone]

## 2021-11-06 NOTE — ED Notes (Signed)
Pt called for vitals, no response. 

## 2021-11-06 NOTE — ED Notes (Signed)
Provider at bedside

## 2021-11-06 NOTE — Discharge Instructions (Signed)
Go to dialysis today to receive your full dialysis session.  The cause of your symptoms was not identified today. It is important for you to get rechecked if you develop new or concerning symptoms.

## 2021-11-06 NOTE — ED Notes (Signed)
C/o flu like symptoms and pain all over for at least 24 hr. Dialysis pt MWF, fistula lt arm. Didn't go to dialysis yesterday.

## 2021-11-11 LAB — CULTURE, BLOOD (ROUTINE X 2)
Culture: NO GROWTH
Culture: NO GROWTH
Special Requests: ADEQUATE

## 2021-12-02 HISTORY — PX: COLONOSCOPY: SHX174

## 2021-12-10 ENCOUNTER — Emergency Department (HOSPITAL_COMMUNITY)
Admission: EM | Admit: 2021-12-10 | Discharge: 2021-12-11 | Disposition: A | Discharge status: Home / Self Care | Payer: Medicare HMO | Attending: Emergency Medicine | Admitting: Emergency Medicine

## 2021-12-10 ENCOUNTER — Other Ambulatory Visit: Payer: Self-pay

## 2021-12-10 ENCOUNTER — Emergency Department (HOSPITAL_COMMUNITY): Payer: Medicare HMO

## 2021-12-10 DIAGNOSIS — Z20822 Contact with and (suspected) exposure to covid-19: Secondary | ICD-10-CM | POA: Diagnosis not present

## 2021-12-10 DIAGNOSIS — R509 Fever, unspecified: Secondary | ICD-10-CM | POA: Diagnosis present

## 2021-12-10 LAB — COMPREHENSIVE METABOLIC PANEL
ALT: 28 U/L (ref 0–44)
AST: 23 U/L (ref 15–41)
Albumin: 3.4 g/dL — ABNORMAL LOW (ref 3.5–5.0)
Alkaline Phosphatase: 210 U/L — ABNORMAL HIGH (ref 38–126)
Anion gap: 14 (ref 5–15)
BUN: 79 mg/dL — ABNORMAL HIGH (ref 6–20)
CO2: 20 mmol/L — ABNORMAL LOW (ref 22–32)
Calcium: 8.7 mg/dL — ABNORMAL LOW (ref 8.9–10.3)
Chloride: 98 mmol/L (ref 98–111)
Creatinine, Ser: 10.86 mg/dL — ABNORMAL HIGH (ref 0.44–1.00)
GFR, Estimated: 4 mL/min — ABNORMAL LOW (ref >60)
Glucose, Bld: 131 mg/dL — ABNORMAL HIGH (ref 70–99)
Potassium: 5.4 mmol/L — ABNORMAL HIGH (ref 3.5–5.1)
Sodium: 132 mmol/L — ABNORMAL LOW (ref 135–145)
Total Bilirubin: 0.9 mg/dL (ref 0.3–1.2)
Total Protein: 7 g/dL (ref 6.5–8.1)

## 2021-12-10 LAB — RESP PANEL BY RT-PCR (FLU A&B, COVID) ARPGX2
Influenza A by PCR: NEGATIVE
Influenza B by PCR: NEGATIVE
SARS Coronavirus 2 by RT PCR: NEGATIVE

## 2021-12-10 LAB — CBC WITH DIFFERENTIAL/PLATELET
Abs Immature Granulocytes: 0.02 10*3/uL (ref 0.00–0.07)
Basophils Absolute: 0 10*3/uL (ref 0.0–0.1)
Basophils Relative: 1 %
Eosinophils Absolute: 0.3 10*3/uL (ref 0.0–0.5)
Eosinophils Relative: 3 %
HCT: 38.2 % (ref 36.0–46.0)
Hemoglobin: 11.9 g/dL — ABNORMAL LOW (ref 12.0–15.0)
Immature Granulocytes: 0 %
Lymphocytes Relative: 29 %
Lymphs Abs: 2.2 10*3/uL (ref 0.7–4.0)
MCH: 31.6 pg (ref 26.0–34.0)
MCHC: 31.2 g/dL (ref 30.0–36.0)
MCV: 101.3 fL — ABNORMAL HIGH (ref 80.0–100.0)
Monocytes Absolute: 0.9 10*3/uL (ref 0.1–1.0)
Monocytes Relative: 13 %
Neutro Abs: 4.1 10*3/uL (ref 1.7–7.7)
Neutrophils Relative %: 54 %
Platelets: 185 10*3/uL (ref 150–400)
RBC: 3.77 MIL/uL — ABNORMAL LOW (ref 3.87–5.11)
RDW: 15.2 % (ref 11.5–15.5)
WBC: 7.6 10*3/uL (ref 4.0–10.5)
nRBC: 0 % (ref 0.0–0.2)

## 2021-12-10 LAB — I-STAT CHEM 8, ED
BUN: 86 mg/dL — ABNORMAL HIGH (ref 6–20)
Calcium, Ion: 1.14 mmol/L — ABNORMAL LOW (ref 1.15–1.40)
Chloride: 100 mmol/L (ref 98–111)
Creatinine, Ser: 10.5 mg/dL — ABNORMAL HIGH (ref 0.44–1.00)
Glucose, Bld: 124 mg/dL — ABNORMAL HIGH (ref 70–99)
HCT: 39 % (ref 36.0–46.0)
Hemoglobin: 13.3 g/dL (ref 12.0–15.0)
Potassium: 5.2 mmol/L — ABNORMAL HIGH (ref 3.5–5.1)
Sodium: 132 mmol/L — ABNORMAL LOW (ref 135–145)
TCO2: 22 mmol/L (ref 22–32)

## 2021-12-10 LAB — LACTIC ACID, PLASMA: Lactic Acid, Venous: 1.1 mmol/L (ref 0.5–1.9)

## 2021-12-10 MED ORDER — SODIUM ZIRCONIUM CYCLOSILICATE 10 G PO PACK
10.0000 g | PACK | Freq: Once | ORAL | Status: AC
  Administered 2021-12-10: 10 g via ORAL
  Filled 2021-12-10: qty 1

## 2021-12-10 NOTE — Discharge Instructions (Addendum)
I am prescribing you an antibiotic called Keflex.  Please take this twice per day for the next 10 days.  Do not stop taking this early.  If you develop any new or worsening symptoms please come back to the emergency department immediately.

## 2021-12-10 NOTE — ED Triage Notes (Signed)
Pt c/o fever off and on since yesterday. Pt states her temp was 102.9 and she called her dialysis center who recommended she come to the ED.

## 2021-12-10 NOTE — ED Provider Notes (Signed)
[facility] EMERGENCY DEPARTMENT Provider Note   CSN: [id number] Arrival date & time: 12/10/21  7619     History DM, ESRD on HD M/W/F last dialysis Friday 12/07/21 Chief Complaint  Patient presents with   Fever    Karen [name] is a 46 y.o. female.  The history is provided by the patient. No language interpreter was used.  Fever Max temp prior to arrival:  102.9 Temp source:  Oral Severity:  Severe Onset quality:  Sudden Duration:  24 hours Timing:  Intermittent Progression:  Resolved Chronicity:  New Relieved by:  Acetaminophen Worsened by:  Nothing Associated symptoms: chills and myalgias   Associated symptoms: no chest pain, no confusion, no congestion, no cough, no diarrhea, no dysuria, no ear pain, no headaches, no nausea, no rash, no rhinorrhea, no somnolence, no sore throat and no vomiting   Associated symptoms comment:  Hoarse voice Risk factors: no contaminated food, no contaminated water, no hx of cancer, no immunosuppression, no occupational exposure, no recent sickness, no recent travel and no sick contacts       Home Medications Prior to Admission medications   Medication Sig Start Date End Date Taking? Authorizing Provider  albuterol (ACCUNEB) 0.63 MG/3ML nebulizer solution Take 3 mLs (0.63 mg dose) by nebulization every 6 (six) hours as needed for Wheezing. 01/19/19   [provider]  amLODipine (NORVASC) 10 MG tablet Take 1 tablet (10 mg total) by mouth daily. 06/12/18   Bonnielee Haff, MD  amoxicillin-clavulanate (AUGMENTIN) 250-125 MG tablet TAKE ONE TABLET BY MOUTH DAILY FOR 10 DAYS. 01/20/19   [provider]  B Complex-C-Folic Acid (RENA-VITE PO) Take 1 tablet by mouth daily.     [provider]  Blood Glucose Monitoring Suppl (ACCU-CHEK AVIVA PLUS) w/Device KIT by Does not apply route. 12/09/19   [provider]  calcium acetate (PHOSLO) 667 MG capsule Take 1,334 mg by mouth 3 (three) times daily  with meals.     [provider]  Cholecalciferol 25 MCG (1000 UT) tablet Take 1,000 Units by mouth daily.     [provider]  cholestyramine Lucrezia Starch) 4 g packet  08/04/20   [provider]  cinacalcet (SENSIPAR) 60 MG tablet Take by mouth.    [provider]  ciprofloxacin (CIPRO) 250 MG tablet Take 250 mg by mouth daily. 09/17/19   [provider]  clindamycin (CLEOCIN) 300 MG capsule Take 1 capsule (300 mg total) by mouth 2 (two) times daily. 02/23/19   Evelina Bucy, DPM  clobetasol (TEMOVATE) 0.05 % external solution Apply to scalp 1-2 times daily prn 04/03/21 04/03/22  [provider]  colestipol (COLESTID) 1 g tablet Take by mouth. 11/05/18   [provider]  collagenase (SANTYL) ointment Apply 1 application topically daily. 05/07/18   Evelina Bucy, DPM  cyclobenzaprine (FLEXERIL) 10 MG tablet TAKE 1 TABLET BY MOUTH 3 TIMES A DAY AS NEEDED FOR MUSCLE SPASMS FOR 10 DAYS 10/07/18   [provider]  diclofenac sodium (VOLTAREN) 1 % GEL Place onto the skin. 10/07/18   [provider]  diclofenac sodium (VOLTAREN) 1 % GEL PLACE ONTO THE SKIN 4 (FOUR) TIMES A DAY AS NEEDED. 10/07/18   [provider]  dicyclomine (BENTYL) 20 MG tablet  05/19/20   [provider]  dicyclomine (BENTYL) 20 MG tablet Take by mouth. 05/19/20   [provider]  diphenhydrAMINE (BENADRYL) 25 MG tablet Take 25 mg by mouth daily as needed (seasonal allergies).  [provider]  doxycycline (VIBRAMYCIN) 100 MG capsule TAKE ONE CAPSULE (100 MG DOSE) BY MOUTH 2 (TWO) TIMES DAILY FOR 10 DAYS. 09/25/18   [provider]  erythromycin ophthalmic ointment APPLY SMALL THIN LAYER TO UPPER AND LOWER EYE LIDS. 08/13/18   [provider]  erythromycin with ethanol [facility]) 2 % external solution Apply thin layer to acne prone areas in the morning 04/12/21   [provider]  ethyl chloride spray  See admin instructions. 09/29/18   [provider]  famotidine (PEPCID) 20 MG tablet Take 20 mg by mouth See admin instructions. Take one tablet (20 mg) by mouth every morning, take an additional tablet (20 mg) at night if needed for heartburn    [provider]  fluconazole (DIFLUCAN) 150 MG tablet TAKE 1 TABLET BY MOUTH FOR 1 DOSE 01/25/19   [provider]  fluticasone (FLONASE) 50 MCG/ACT nasal spray Place 1-2 sprays into both nostrils daily. 11/20/18   Antonietta Breach, PA-C  FOSRENOL 1000 MG PACK  01/12/19   [provider]  gabapentin (NEURONTIN) 100 MG capsule Take 100 mg by mouth 3 (three) times daily.  01/26/18   [provider]  gabapentin (NEURONTIN) 100 MG capsule Take by mouth. 03/12/21   [provider]  glucagon 1 MG injection Inject into the skin. 06/16/18   [provider]  glucose blood (ACCU-CHEK AVIVA PLUS) test strip USE TO MONITOR BLOOD GLUCOSE 4 TIME(S) DAILY 11/12/19   [provider]  hydrALAZINE (APRESOLINE) 100 MG tablet Take 100 mg by mouth every 8 (eight) hours.  10/24/17   [provider]  hydrALAZINE (APRESOLINE) 100 MG tablet Take by mouth.    [provider]  insulin aspart (NOVOLOG) 100 UNIT/ML FlexPen Inject 2-6 Units into the skin 3 (three) times daily as needed for high blood sugar (CBG>100).    [provider]  insulin glargine (LANTUS) 100 UNIT/ML Solostar Pen Inject 10 Units into the skin at bedtime.    [provider]  Insulin Pen Needle (BD PEN NEEDLE NANO U/F) 32G X 4 MM MISC Use with insulin 4x/day 12/09/19   [provider]  ketoconazole (NIZORAL) 2 % shampoo Apply topically. 04/05/21   [provider]  ketorolac (ACULAR) 0.4 % SOLN  03/29/20   [provider]  labetalol (NORMODYNE) 200 MG tablet Take 400 mg by mouth every 8 (eight) hours.     [provider]  labetalol (NORMODYNE) 200 MG tablet Take by mouth.    [provider]  loperamide (IMODIUM) 2 MG capsule  10/15/18   [provider]  LORazepam (ATIVAN) 0.5 MG tablet Take 0.5 mg by mouth See admin instructions. Take one tablet (0.5 mg) by mouth 30 minutes prior dialysis on Monday, Wednesday, Friday 05/12/18   [provider]  losartan (COZAAR) 100 MG tablet Take 1 tablet (100 mg total) by mouth daily. 06/12/18   Bonnielee Haff, MD  Methoxy PEG-Epoetin Beta 100 MCG/0.3ML SOSY Inject as directed.    [provider]  Misc. Devices MISC Dispense 1 rollator with seat and adjustable height. Dx:R26.2, R6821001, R53.81, Z91.81 05/28/21   [provider]  mometasone (ELOCON) 0.1 % ointment  10/14/18   [provider]  nicotine (NICODERM CQ - DOSED IN MG/24 HOURS) 14 mg/24hr patch Place onto the skin. 06/18/19   [provider]  ofloxacin (OCUFLOX) 0.3 % ophthalmic solution  03/29/20   [provider]  oxyCODONE-acetaminophen (PERCOCET/ROXICET) 5-325 MG tablet Take 1 tablet by  mouth every 6 (six) hours as needed. 03/28/21   [provider]  pantoprazole (PROTONIX) 40 MG tablet Take by mouth. 05/18/20 05/18/21  [provider]  polyvinyl alcohol (LIQUIFILM TEARS) 1.4 % ophthalmic solution Place 1 drop into both eyes 2 (two) times daily.    [provider]  prednisoLONE acetate (PRED FORTE) 1 % ophthalmic suspension  03/29/20   [provider]  Respiratory Therapy Supplies (NEBULIZER) DEVI Use as directed every 4-6 hours as needed 01/19/19   [provider]  tretinoin (RETIN-A) 0.025 % cream Apply pea size amount to face nightly 04/03/21 04/03/22  [provider]  trimethoprim-polymyxin b (POLYTRIM) ophthalmic solution Place one drop into both eyes every 4 (four) hours for 7 days. 09/16/18   [provider]  trimethoprim-polymyxin b (POLYTRIM) ophthalmic solution PLACE ONE DROP INTO BOTH EYES EVERY 4 (FOUR) HOURS FOR 7 DAYS. 09/16/18   [provider]      Allergies    Furosemide, Heparin, and Insulin aspart    Review of Systems   Review of Systems  Constitutional:  Positive for chills and fever.  HENT:  Negative for congestion, ear pain, rhinorrhea and sore throat.   Respiratory:  Negative for cough.   Cardiovascular:  Negative for chest pain.  Gastrointestinal:  Negative for diarrhea, nausea and vomiting.  Genitourinary:  Negative for dysuria.  Musculoskeletal:  Positive for myalgias.  Skin:  Negative for rash.  Neurological:  Negative for headaches.  Psychiatric/Behavioral:  Negative for confusion.    Physical Exam Updated Vital Signs BP (!) 179/79    Pulse 80    Temp 97.6 F (36.4 C)    Resp (!) 25    Ht 5' 5"  (1.651 m)    Wt 86 kg    SpO2 98%    BMI 31.55 kg/m  Physical Exam Vitals and nursing note reviewed.  Constitutional:      General: She is not in acute distress.    Appearance: She is well-developed. She is not diaphoretic.  HENT:     Head: Normocephalic and atraumatic.     Right Ear: External ear normal.     Left Ear: External ear normal.     Nose: Nose normal.     Mouth/Throat:     Mouth: Mucous membranes are moist.  Eyes:     General: No scleral icterus.    Conjunctiva/sclera: Conjunctivae normal.  Cardiovascular:     Rate and Rhythm: Normal rate and regular rhythm.     Heart sounds: Normal heart sounds. No murmur heard.   No friction rub. No gallop.     Arteriovenous access: Left arteriovenous access is present.    Comments: Left AV access, palpable thrill Pulmonary:     Effort: Pulmonary effort is normal. No respiratory distress.     Breath sounds: Normal breath sounds.  Abdominal:     General: Bowel sounds are normal. There is no distension.     Palpations: Abdomen is soft. There is no mass.     Tenderness: There is no abdominal tenderness. There is no guarding.  Musculoskeletal:     Cervical back: Normal range of motion.  Skin:    General: Skin is warm and dry.  Neurological:      Mental Status: She is alert and oriented to person, place, and time.  Psychiatric:        Behavior: Behavior normal.    ED Results / Procedures / Treatments   Labs (all labs ordered are listed, but only  abnormal results are displayed) Labs Reviewed  RESP PANEL BY RT-PCR (FLU A&B, COVID) ARPGX2  CBC WITH DIFFERENTIAL/PLATELET  COMPREHENSIVE METABOLIC PANEL  LACTIC ACID, PLASMA  LACTIC ACID, PLASMA  URINALYSIS, ROUTINE W REFLEX MICROSCOPIC  I-STAT CHEM 8, ED    EKG None  Radiology DG Chest 2 View  Result Date: 12/10/2021 CLINICAL DATA:  Fever EXAM: CHEST - 2 VIEW COMPARISON:  Chest XR, 11/06/2021. FINDINGS: Cardiomediastinal silhouette is within normal limits given degree of inflation. Mild relative hypoinflation. No focal consolidation or mass. No pleural effusion or pneumothorax. No acute displaced fracture. IMPRESSION: No active cardiopulmonary disease. Electronically Signed   By: Michaelle Birks M.D.   On: 12/10/2021 07:55    Procedures Procedures   Medications Ordered in ED Medications - No data to display  ED Course/ Medical Decision Making/ A&P Clinical Course as of 12/11/21 2325  Mon Dec 10, 2021  2128 Resp Panel by RT-PCR (Flu A&B, Covid) Nasopharyngeal Swab Resp panel negative [AH]  2128 DG Chest 2 View I interpreted the cxr- no edema or infiltrate [AH]  2201 Potassium(!): 5.2 [AH]  Tue Dec 11, 2021  0033 Chalmers GuestMarland Kitchen): MODERATE [LJ]  0056 Bacteria, UA(!): MANY [LJ]  0056 WBC, UA: >50 [LJ]    Clinical Course User Index [AH] [name], PA-C [LJ] Rayna Sexton, PA-C                           Medical Decision Making Patient here with complaint of fever.  No documented he fever here in the emergency department.  Awaiting urinalysis.  Signout given to PA [facility]  Amount and/or Complexity of Data Reviewed Labs: ordered. Decision-making details documented in ED Course.    Details: Respiratory panel negative for COVID or influenza/ Potassium 5.2,  Lokelma ordered Radiology: ordered and independent interpretation performed. Decision-making details documented in ED Course.   Patient here with c/o fever. No documented fever in the ED.   Final Clinical Impression(s) / ED Diagnoses Final diagnoses:  None    Rx / DC Orders ED Discharge Orders     None         [name], PA-C 12/11/21 2328    Lajean Saver, MD 12/13/21 [phone]

## 2021-12-10 NOTE — ED Provider Triage Note (Signed)
Emergency Medicine Provider Triage Evaluation Note  Liliann File , a 46 y.o. female  was evaluated in triage.  Pt complains of fever max temp 102.9 last night, took Tylenol. With nausea and body aches. Last dialysis Friday. Attends  Had COVID 11/25/21 Possibly exposed to sick niece.   Review of Systems  Positive: Fever, body aches Negative: Vomiting   Physical Exam  BP 138/69 (BP Location: Right Arm)    Pulse 84    Temp 99.4 F (37.4 C) (Oral)    Resp 15    Ht 5\' 5"  (1.651 m)    Wt 86 kg    SpO2 94%    BMI 31.55 kg/m  Gen:   Awake, no distress   Resp:  Normal effort  MSK:   Moves extremities without difficulty  Other:    Medical Decision Making  Medically screening exam initiated at 7:22 AM.  Appropriate orders placed.  [facility] was informed that the remainder of the evaluation will be completed by another provider, this initial triage assessment does not replace that evaluation, and the importance of remaining in the ED until their evaluation is complete.     Tacy Learn, PA-C 12/10/21 [phone]

## 2021-12-10 NOTE — ED Notes (Signed)
Name called for vitals, no response

## 2021-12-10 NOTE — ED Provider Notes (Signed)
Patient is a 46 year old female whose care was transferred to me at shift change from Karen [name].  In summary, patient has a history of diabetes mellitus as well as ESRD on HD.  She notes recent fevers and chills.  She did not receive dialysis today due to her symptoms and was told to come to the emergency department.  Since coming here her initial temperature was 99.4 F which has improved to 97.6 F without intervention.  Patient reports associated chills and myalgias.  No chest pain, confusion, congestion, cough, diarrhea, dysuria, ear pain, headaches, nausea, rash, rhinorrhea, somnolence, sore throat, vomiting. Physical Exam  BP (!) 171/74    Pulse 80    Temp 97.6 F (36.4 C)    Resp 20    Ht 5\' 5"  (1.651 m)    Wt 86 kg    SpO2 99%    BMI 31.55 kg/m   Physical Exam Vitals and nursing note reviewed.  Constitutional:      General: She is not in acute distress.    Appearance: She is well-developed. She is not diaphoretic.  HENT:     Head: Normocephalic and atraumatic.     Right Ear: External ear normal.     Left Ear: External ear normal.     Nose: Nose normal.     Mouth/Throat:     Mouth: Mucous membranes are moist.  Eyes:     General: No scleral icterus.    Conjunctiva/sclera: Conjunctivae normal.  Cardiovascular:     Rate and Rhythm: Normal rate and regular rhythm.     Heart sounds: Normal heart sounds. No murmur heard.   No friction rub. No gallop.     Arteriovenous access: Left arteriovenous access is present.    Comments: Left AV access, palpable thrill Pulmonary:     Effort: Pulmonary effort is normal. No respiratory distress.     Breath sounds: Normal breath sounds.  Abdominal:     General: Bowel sounds are normal. There is no distension.     Palpations: Abdomen is soft. There is no mass.     Tenderness: There is no abdominal tenderness. There is no guarding.  Musculoskeletal:     Cervical back: Normal range of motion.  Skin:    General: Skin is warm and dry.   Neurological:     Mental Status: She is alert and oriented to person, place, and time.  Psychiatric:        Behavior: Behavior normal.  Procedures  Procedures  ED Course / MDM   Clinical Course as of 12/11/21 0108  Mon Dec 10, 2021  2128 Resp Panel by RT-PCR (Flu A&B, Covid) Nasopharyngeal Swab Resp panel negative [AH]  2128 DG Chest 2 View I interpreted the cxr- no edema or infiltrate [AH]  2201 Potassium(!): 5.2 [AH]  Tue Dec 11, 2021  0033 Chalmers GuestMarland Kitchen): MODERATE [LJ]  0056 Bacteria, UA(!): MANY [LJ]  0056 WBC, UA: >50 [LJ]    Clinical Course User Index [AH] [name], PA-C [LJ] Rayna Sexton, PA-C   Medical Decision Making Patient is a 46 year old female whose care was transferred me at shift change from Karen [name].  Please see her note as well as my HPI above for additional information.  At the time of shift change lab work has resulted but patient is pending a UA.  CBC without leukocytosis.  Hemoglobin stable at 11.9.  CMP consistent with patient's history of ESRD.  CO2 of 20.  Potassium 5.4.  Patient given 10 g of Lokelma.  Lactic acid within normal limits at 1.1.  Chest x-ray is negative.  Respiratory panel is negative.  UA has since resulted which shows greater than 50 squamous epithelial cells, many bacteria, greater than 50 white blood cells, greater than 50 red blood cells, glucosuria greater than 500, moderate hemoglobin, greater than 300 protein, moderate leukocytes.  Although there are greater than 50 squamous epithelial cells, patient has no other source of infection.  We will treat for possible UTI.  Patient unable to tolerate IV Rocephin and will prefer oral antibiotics.  Will treat with oral Keflex.  First dose given in the emergency department.  Patient is afebrile and not tachycardic.  Nontoxic-appearing.  Feel that she is stable for discharge at this time and she is agreeable.  Given very strict return precautions.  She states that she is going to  follow-up with her dialysis [name] tomorrow morning and get scheduled for dialysis tomorrow.  Her questions were answered and she was amicable at the time of discharge.       Rayna Sexton, PA-C 12/11/21 0110    Lajean Saver, MD 12/11/21 2127

## 2021-12-11 DIAGNOSIS — R509 Fever, unspecified: Secondary | ICD-10-CM | POA: Diagnosis not present

## 2021-12-11 LAB — URINALYSIS, ROUTINE W REFLEX MICROSCOPIC
Bilirubin Urine: NEGATIVE
Glucose, UA: >=500 mg/dL — AB
Ketones, ur: NEGATIVE mg/dL
Nitrite: NEGATIVE
Protein, ur: >300 mg/dL — AB
Specific Gravity, Urine: 1.015 (ref 1.005–1.030)
pH: 7 (ref 5.0–8.0)

## 2021-12-11 LAB — URINALYSIS, MICROSCOPIC (REFLEX)
RBC / HPF: >50 RBC/hpf (ref 0–5)
Squamous Epithelial / HPF: >50 (ref 0–5)
WBC, UA: >50 WBC/hpf (ref 0–5)

## 2021-12-11 MED ORDER — CEPHALEXIN 250 MG PO CAPS
500.0000 mg | ORAL_CAPSULE | Freq: Once | ORAL | Status: AC
  Administered 2021-12-11: 500 mg via ORAL
  Filled 2021-12-11: qty 2

## 2021-12-11 MED ORDER — SODIUM CHLORIDE 0.9 % IV SOLN: 1.0000 g | Freq: Once | INTRAVENOUS | Status: DC

## 2021-12-11 MED ORDER — CEPHALEXIN 500 MG PO CAPS: 500.0000 mg | ORAL_CAPSULE | Freq: Two times a day (BID) | ORAL | 0 refills | Status: AC

## 2021-12-15 LAB — CULTURE, BLOOD (SINGLE): Culture: NO GROWTH

## 2022-01-15 ENCOUNTER — Ambulatory Visit (INDEPENDENT_AMBULATORY_CARE_PROVIDER_SITE_OTHER): Payer: Medicare HMO | Admitting: Podiatry

## 2022-01-15 ENCOUNTER — Other Ambulatory Visit: Payer: Self-pay | Admitting: *Deleted

## 2022-01-15 ENCOUNTER — Encounter: Payer: Self-pay | Admitting: Podiatry

## 2022-01-15 ENCOUNTER — Other Ambulatory Visit: Payer: Self-pay

## 2022-01-15 DIAGNOSIS — E1142 Type 2 diabetes mellitus with diabetic polyneuropathy: Secondary | ICD-10-CM | POA: Diagnosis not present

## 2022-01-15 DIAGNOSIS — E1169 Type 2 diabetes mellitus with other specified complication: Secondary | ICD-10-CM

## 2022-01-15 DIAGNOSIS — B351 Tinea unguium: Secondary | ICD-10-CM | POA: Diagnosis not present

## 2022-01-15 NOTE — Progress Notes (Signed)
°  Subjective:  Patient ID: Karen [name], female    DOB: May 21, 1976,  MRN: [id number]  Chief Complaint  Patient presents with   Nail Problem    I am here to get the toenails trimmed    46 y.o. female presents with the above complaint. History confirmed with patient. No new pedal issues today. 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  Diabetes and DPN -Patient is diabetic with a qualifying condition for at risk foot care.  Procedure: Nail Debridement Type of Debridement: manual, sharp debridement. Instrumentation: Nail nipper, rotary burr. Number of Nails: 10 Disposition: Patient tolerated well without iatrogenic injury.   Return in about 3 months (around 04/14/2022) for Diabetic Foot Care.

## 2022-01-24 DIAGNOSIS — Z993 Dependence on wheelchair: Secondary | ICD-10-CM | POA: Insufficient documentation

## 2022-01-24 DIAGNOSIS — M797 Fibromyalgia: Secondary | ICD-10-CM | POA: Insufficient documentation

## 2022-04-16 ENCOUNTER — Ambulatory Visit: Payer: Medicare HMO | Admitting: Podiatry

## 2022-05-02 ENCOUNTER — Ambulatory Visit (INDEPENDENT_AMBULATORY_CARE_PROVIDER_SITE_OTHER): Payer: Medicare HMO | Admitting: Podiatry

## 2022-05-02 ENCOUNTER — Encounter: Payer: Self-pay | Admitting: Podiatry

## 2022-05-02 DIAGNOSIS — E1142 Type 2 diabetes mellitus with diabetic polyneuropathy: Secondary | ICD-10-CM

## 2022-05-02 DIAGNOSIS — E1169 Type 2 diabetes mellitus with other specified complication: Secondary | ICD-10-CM

## 2022-05-02 DIAGNOSIS — B351 Tinea unguium: Secondary | ICD-10-CM

## 2022-05-02 NOTE — Progress Notes (Signed)
  Subjective:  Patient ID: Karen [name], female    DOB: March 26, 1976,  MRN: [id number]  Chief Complaint  Patient presents with   Nail Problem     Routine foot care   46 y.o. female presents for concern of thickened elongated and painful nails that are difficult to trim. Requesting to have them trimmed today. Relates burning and tingling in their feet. Patient is diabetic and last A1c was No results found for: HGBA1C .   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 -Discussed and educated patient on diabetic foot care, especially with  regards to the vascular, neurological and musculoskeletal systems.  -Stressed the importance of good glycemic control and the detriment of not  controlling glucose levels in relation to the foot. -Discussed supportive shoes at all times and checking feet regularly.  -Mechanically debrided all nails 1-5 bilateral using sterile nail nipper and filed with dremel without incident  -Answered all patient questions -Patient to return  in 3 months for at risk foot care -Patient advised to call the office if any problems or questions arise in the meantime.    Return in about 3 months (around 08/02/2022) for rfc.

## 2022-05-14 DIAGNOSIS — N9489 Other specified conditions associated with female genital organs and menstrual cycle: Secondary | ICD-10-CM | POA: Insufficient documentation

## 2022-06-13 DIAGNOSIS — G4733 Obstructive sleep apnea (adult) (pediatric): Secondary | ICD-10-CM | POA: Insufficient documentation

## 2022-06-21 DIAGNOSIS — E11319 Type 2 diabetes mellitus with unspecified diabetic retinopathy without macular edema: Secondary | ICD-10-CM | POA: Insufficient documentation

## 2022-06-21 DIAGNOSIS — I120 Hypertensive chronic kidney disease with stage 5 chronic kidney disease or end stage renal disease: Secondary | ICD-10-CM | POA: Insufficient documentation

## 2022-06-21 DIAGNOSIS — Z87891 Personal history of nicotine dependence: Secondary | ICD-10-CM | POA: Insufficient documentation

## 2022-06-21 DIAGNOSIS — Z794 Long term (current) use of insulin: Secondary | ICD-10-CM | POA: Insufficient documentation

## 2022-06-21 DIAGNOSIS — Z992 Dependence on renal dialysis: Secondary | ICD-10-CM | POA: Insufficient documentation

## 2022-07-09 ENCOUNTER — Ambulatory Visit (INDEPENDENT_AMBULATORY_CARE_PROVIDER_SITE_OTHER): Payer: Medicare HMO | Admitting: Podiatry

## 2022-07-09 ENCOUNTER — Encounter: Payer: Self-pay | Admitting: Podiatry

## 2022-07-09 ENCOUNTER — Ambulatory Visit (INDEPENDENT_AMBULATORY_CARE_PROVIDER_SITE_OTHER): Payer: Medicare HMO

## 2022-07-09 DIAGNOSIS — L97512 Non-pressure chronic ulcer of other part of right foot with fat layer exposed: Secondary | ICD-10-CM

## 2022-07-09 DIAGNOSIS — E1142 Type 2 diabetes mellitus with diabetic polyneuropathy: Secondary | ICD-10-CM

## 2022-07-09 NOTE — Progress Notes (Signed)
  Subjective:  Patient ID: Karen [name], female    DOB: Feb 11, 1976,   MRN: [id number]  Chief Complaint  Patient presents with   Wound Check    Right foot wound check , patient is a diabetic , happen 2 days ago , bleeding and discharge.    46 y.o. female presents for concern of right foot wound that appeared tow days ago. Has noticed bleeding and discharge. She is diabetic and her last A1c was 10.8 . Denies any other pedal complaints. Denies n/v/f/c.   PCP: Renaldo Reel PA  Past Medical History:  Diagnosis Date   Diabetes mellitus without complication ([place])    Hypertension    Osteomyelitis ([place])    RIGHT FOOT FIFTH TOE   Renal disorder     Objective:  Physical Exam: Vascular: DP/PT pulses 2/4 bilateral. CFT <3 seconds. Normal hair growth on digits. No edema.  Skin. No lacerations or abrasions bilateral feet. Right foot ulceration to distal amputation site with granular base measuring about 1.5 cm x 2 cm x 0.2 cm. No erythema edema or purulence noted.  Musculoskeletal: MMT 5/5 bilateral lower extremities in DF, PF, Inversion and Eversion. Deceased ROM in DF of ankle joint.  Neurological: Sensation intact to light touch.   Assessment:   1. Ulcer of right foot with fat layer exposed ([place])      Plan:  Patient was evaluated and treated and all questions answered. Ulcer right lateral foot with fat layer exposed.  -X-rays no osseous erosions noted around fifth residual metatarsal.  -Debridement as below. -Dressed with betadine, DSD. -Off-loading with surgical shoe. Dispensed.  -No abx indicated.  -Discussed glucose control and proper protein-rich diet.  -Discussed if any worsening redness, pain, fever or chills to call or may need to report to the emergency room. Patient expressed understanding.   Procedure: Excisional Debridement of Wound Rationale: Removal of non-viable soft tissue from the wound to promote healing.  Anesthesia: none Pre-Debridement Wound  Measurements: Overlying hyperkeratosis Post-Debridement Wound Measurements: 2 cm x 1.5 cm x 0.2 cm  Type of Debridement: Sharp Excisional Tissue Removed: Non-viable soft tissue Depth of Debridement: subcutaneous tissue. Technique: Sharp excisional debridement to bleeding, viable wound base.  Dressing: Dry, sterile, compression dressing. Disposition: Patient tolerated procedure well. Patient to return in 2 week for follow-up.  No follow-ups on file.   Lorenda Peck, DPM

## 2022-07-23 ENCOUNTER — Encounter: Payer: Self-pay | Admitting: Podiatry

## 2022-07-23 ENCOUNTER — Ambulatory Visit (INDEPENDENT_AMBULATORY_CARE_PROVIDER_SITE_OTHER): Payer: Medicare HMO | Admitting: Podiatry

## 2022-07-23 DIAGNOSIS — E1142 Type 2 diabetes mellitus with diabetic polyneuropathy: Secondary | ICD-10-CM | POA: Diagnosis not present

## 2022-07-23 DIAGNOSIS — L84 Corns and callosities: Secondary | ICD-10-CM

## 2022-07-23 DIAGNOSIS — S99921A Unspecified injury of right foot, initial encounter: Secondary | ICD-10-CM

## 2022-07-23 NOTE — Progress Notes (Signed)
  Subjective:  Patient ID: Karen [name], female    DOB: 07-25-1976,   MRN: [id number]  Chief Complaint  Patient presents with   Foot Ulcer    Follow up ulcer lateral right   "That one spot looks better, but I woke up this morning with my toes bleeding"    46 y.o. female presents for concern of right foot wound that is doing well but recently hit her big toe and now concerned about that. . Has noticed bleeding and discharge. She is diabetic and her last A1c was 10.8 . Denies any other pedal complaints. Denies n/v/f/c.   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 CFT <3 seconds. Normal hair growth on digits. No edema.  Skin. No lacerations or abrasions bilateral feet. Right foot ulceration to distal amputation site healed. Right hallux loose distally upon debridement nail bed exposed but appears healthy.  Musculoskeletal: MMT 5/5 bilateral lower extremities in DF, PF, Inversion and Eversion. Deceased ROM in DF of ankle joint.  Neurological: Sensation intact to light touch.   Assessment:   1. Pre-ulcerative calluses   2. Type 2 diabetes mellitus with diabetic polyneuropathy, unspecified whether long term insulin use ([place])   3. Injury of toenail of right foot, initial encounter       Plan:  Patient was evaluated and treated and all questions answered. Ulcer right lateral foot -healed -X-rays no osseous erosions noted around fifth residual metatarsal.  -Debridement of hyperkeratotic tissue with underlying ulcer healed.  -Hallux nail loose was debrided with healthy underlying nail bed noted.  -Advised to keep neopsorin and bandaid on toe for next week.  -May return to regular shoes.  -No abx indicated.  -Discussed glucose control and proper  protein-rich diet.  -Discussed if any worsening redness, pain, fever or chills to call or may need to report to the emergency room. Patient expressed understanding.  Return in 4 weeks for recheck of wound.     Return in about 4 weeks (around 08/20/2022) for wound check.   Lorenda Peck, DPM

## 2022-08-01 ENCOUNTER — Encounter: Payer: Self-pay | Admitting: Vascular Surgery

## 2022-08-01 ENCOUNTER — Ambulatory Visit (INDEPENDENT_AMBULATORY_CARE_PROVIDER_SITE_OTHER): Payer: Medicare HMO | Admitting: Vascular Surgery

## 2022-08-01 VITALS — BP 144/79 | HR 73 | Temp 98.0°F | Resp 20 | Ht 65.0 in | Wt 189.0 lb

## 2022-08-01 DIAGNOSIS — Z992 Dependence on renal dialysis: Secondary | ICD-10-CM | POA: Diagnosis not present

## 2022-08-01 DIAGNOSIS — N186 End stage renal disease: Secondary | ICD-10-CM | POA: Diagnosis not present

## 2022-08-01 NOTE — Progress Notes (Signed)
REASON FOR VISIT:   To evaluate AV fistula.  The consult is requested by Dr. Dimas Aguas.   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  In addition the dialysis unit is having a hard time cannulating the fistula above this area as the vein is deeper.  Thus she is having a hard time getting dialysis.  I think the best option is to place a tunneled dialysis catheter and at the same time I could plicate the 2 small aneurysms and perhaps superficialize the upper part of the fistula.  She dialyzes on Monday Wednesdays and Fridays so we will schedule this on Tuesday, 08/06/2022.  Hopefully ultimately we can get this fistula working again.  If not she will have to be evaluated for new access.  HPI:   Karen [name] is a pleasant 46 y.o. 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  In addition they have had a hard time cannulating her fistula especially in the upper arm.  This is a left brachiocephalic fistula that was placed in February 2019.  She dialyzes on Monday Wednesdays and Fridays.  She is scheduled for removal of an ovarian cyst and also bronchoscopy for removal of a mass I believe next month.  Past Medical History:  Diagnosis Date   Diabetes mellitus without complication ([place])    Hypertension    Osteomyelitis ([place])    RIGHT FOOT FIFTH TOE   Renal disorder     History reviewed. No pertinent family history.  SOCIAL HISTORY: Social History   Tobacco Use   Smoking status: Former    Years: 25.00    Types: Cigarettes    Quit date: 2021    Years since quitting: 2.6   Smokeless tobacco: Never  Substance Use Topics   Alcohol use: Not Currently    Allergies  Allergen Reactions   Furosemide Swelling   Heparin Other (See Comments)    Patient relates vitreous hemorrhage after heparin. Patient relates vitreous  hemorrhage after heparin.   Insulin Aspart Swelling    States she takes insulin it just has mild side effects    Current Outpatient Medications  Medication Sig Dispense Refill   albuterol (ACCUNEB) 0.63 MG/3ML nebulizer solution Take 3 mLs (0.63 mg dose) by nebulization every 6 (six) hours as needed for Wheezing.     Alcohol Swabs (DROPSAFE ALCOHOL PREP) 70 % PADS Apply topically.     amLODipine (NORVASC) 10 MG tablet Take 1 tablet (10 mg total) by mouth daily. 30 tablet 0   B Complex-C-Folic Acid (RENA-VITE PO) Take 1 tablet by mouth daily.      Blood Glucose Monitoring Suppl (ACCU-CHEK AVIVA PLUS) w/Device KIT by Does not apply route.     calcium acetate (PHOSLO) 667 MG capsule Take 1,334 mg by mouth 3 (three) times daily with meals.      Cholecalciferol 25 MCG (1000 UT) tablet Take 1,000 Units by mouth daily.      cholestyramine (QUESTRAN) 4 g packet      cinacalcet (SENSIPAR) 60 MG tablet Take by mouth.     colestipol (COLESTID) 1 g tablet Take by mouth.     collagenase (SANTYL) ointment Apply 1 application topically daily. 15 g 5   cyclobenzaprine (FLEXERIL) 10 MG tablet TAKE 1 TABLET BY MOUTH 3 TIMES A DAY AS NEEDED FOR MUSCLE SPASMS FOR 10  DAYS  0   diclofenac sodium (VOLTAREN) 1 % GEL Place onto the skin.     diclofenac sodium (VOLTAREN) 1 % GEL PLACE ONTO THE SKIN 4 (FOUR) TIMES A DAY AS NEEDED.  1   dicyclomine (BENTYL) 20 MG tablet Take by mouth.     diphenhydrAMINE (BENADRYL) 25 MG tablet Take 25 mg by mouth daily as needed (seasonal allergies).     erythromycin ophthalmic ointment APPLY SMALL THIN LAYER TO UPPER AND LOWER EYE LIDS.  0   erythromycin with ethanol (THERAMYCIN) 2 % external solution Apply thin layer to acne prone areas in the morning     ethyl chloride spray See admin instructions.  2   famotidine (PEPCID) 20 MG tablet Take 20 mg by mouth See admin instructions. Take one tablet (20 mg) by mouth every morning, take an additional tablet (20 mg) at night if needed  for heartburn     FLOWFLEX COVID-19 AG HOME TEST KIT EVERY DAY     fluconazole (DIFLUCAN) 150 MG tablet TAKE 1 TABLET BY MOUTH FOR 1 DOSE     fluticasone (FLONASE) 50 MCG/ACT nasal spray Place 1-2 sprays into both nostrils daily. 16 g 0   FOSRENOL 1000 MG PACK      gabapentin (NEURONTIN) 100 MG capsule Take 100 mg by mouth 3 (three) times daily.      gabapentin (NEURONTIN) 100 MG capsule Take by mouth.     glucagon 1 MG injection Inject into the skin.     glucose blood (ACCU-CHEK AVIVA PLUS) test strip USE TO MONITOR BLOOD GLUCOSE 4 TIME(S) DAILY     hydrALAZINE (APRESOLINE) 100 MG tablet Take 100 mg by mouth every 8 (eight) hours.      hydrALAZINE (APRESOLINE) 50 MG tablet Take by mouth.     insulin aspart (NOVOLOG) 100 UNIT/ML FlexPen Inject 2-6 Units into the skin 3 (three) times daily as needed for high blood sugar (CBG>100).     insulin glargine (LANTUS) 100 UNIT/ML Solostar Pen Inject 10 Units into the skin at bedtime.     Insulin Pen Needle (BD PEN NEEDLE NANO U/F) 32G X 4 MM MISC Use with insulin 4x/day     ketoconazole (NIZORAL) 2 % shampoo Apply topically.     ketorolac (ACULAR) 0.4 % SOLN      labetalol (NORMODYNE) 200 MG tablet Take 400 mg by mouth every 8 (eight) hours.      loperamide (IMODIUM) 2 MG capsule      LORazepam (ATIVAN) 0.5 MG tablet Take 0.5 mg by mouth See admin instructions. Take one tablet (0.5 mg) by mouth 30 minutes prior dialysis on Monday, Wednesday, Friday     losartan (COZAAR) 100 MG tablet Take 1 tablet (100 mg total) by mouth daily. 30 tablet 0   Methoxy PEG-Epoetin Beta 100 MCG/0.3ML SOSY Inject as directed.     Misc. Devices MISC Dispense 1 rollator with seat and adjustable height. Dx:R26.2, Z89.421, R53.81, Z91.81     mometasone (ELOCON) 0.1 % ointment      nicotine (NICODERM CQ - DOSED IN MG/24 HOURS) 14 mg/24hr patch Place onto the skin.     ofloxacin (OCUFLOX) 0.3 % ophthalmic solution      oxyCODONE-acetaminophen (PERCOCET/ROXICET) 5-325 MG tablet  Take 1 tablet by mouth every 6 (six) hours as needed.     polyvinyl alcohol (LIQUIFILM TEARS) 1.4 % ophthalmic solution Place 1 drop into both eyes 2 (two) times daily.     prednisoLONE acetate (PRED FORTE) 1 % ophthalmic suspension  Respiratory Therapy Supplies (NEBULIZER) DEVI Use as directed every 4-6 hours as needed     trimethoprim-polymyxin b (POLYTRIM) ophthalmic solution PLACE ONE DROP INTO BOTH EYES EVERY 4 (FOUR) HOURS FOR 7 DAYS.  0   pantoprazole (PROTONIX) 40 MG tablet Take by mouth.     No current facility-administered medications for this visit.    REVIEW OF SYSTEMS:  [X]  denotes positive finding, [ ]  denotes negative finding Cardiac  Comments:  Chest pain or chest pressure:    Shortness of breath upon exertion: X   Short of breath when lying flat:    Irregular heart rhythm:        Vascular    Pain in calf, thigh, or hip brought on by ambulation:    Pain in feet at night that wakes you up from your sleep:     Blood clot in your veins:    Leg swelling:         Pulmonary    Oxygen at home:    Productive cough:     Wheezing:         Neurologic    Sudden weakness in arms or legs:     Sudden numbness in arms or legs:     Sudden onset of difficulty speaking or slurred speech:    Temporary loss of vision in one eye:     Problems with dizziness:         Gastrointestinal    Blood in stool:     Vomited blood:         Genitourinary    Burning when urinating:     Blood in urine:        Psychiatric    Major depression:         Hematologic    Bleeding problems:    Problems with blood clotting too easily:        Skin    Rashes or ulcers:        Constitutional    Fever or chills:     PHYSICAL EXAM:   Vitals:   08/01/22 1353  BP: (!) 144/79  Pulse: 73  Resp: 20  Temp: 98 F (36.7 C)  SpO2: 95%  Weight: 189 lb (85.7 kg)  Height: 5' 5"  (1.651 m)    GENERAL: The patient is a well-nourished female, in no acute distress. The vital signs are  documented above. CARDIAC: There is a regular rate and rhythm.  VASCULAR: I do not detect carotid bruits. Her fistula has a good thrill. She has a aneurysms as noted in the photograph below.   She has a palpable left radial pulse. PULMONARY: There is good air exchange bilaterally without wheezing or rales. ABDOMEN: Soft and non-tender with normal pitched bowel sounds.  MUSCULOSKELETAL: There are no major deformities or cyanosis. NEUROLOGIC: No focal weakness or paresthesias are detected. SKIN: There are no ulcers or rashes noted. PSYCHIATRIC: The patient has a normal affect.  DATA:    No new data  Deitra Mayo [facility] [phone]

## 2022-08-01 NOTE — H&P (View-Only) (Signed)
REASON FOR VISIT:   To evaluate AV fistula.  The consult is requested by Dr. Dimas Aguas.   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  In addition the dialysis unit is having a hard time cannulating the fistula above this area as the vein is deeper.  Thus she is having a hard time getting dialysis.  I think the best option is to place a tunneled dialysis catheter and at the same time I could plicate the 2 small aneurysms and perhaps superficialize the upper part of the fistula.  She dialyzes on Monday Wednesdays and Fridays so we will schedule this on Tuesday, 08/06/2022.  Hopefully ultimately we can get this fistula working again.  If not she will have to be evaluated for new access.  HPI:   Karen [name] is a pleasant 46 y.o. 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  In addition they have had a hard time cannulating her fistula especially in the upper arm.  This is a left brachiocephalic fistula that was placed in February 2019.  She dialyzes on Monday Wednesdays and Fridays.  She is scheduled for removal of an ovarian cyst and also bronchoscopy for removal of a mass I believe next month.  Past Medical History:  Diagnosis Date   Diabetes mellitus without complication ([place])    Hypertension    Osteomyelitis ([place])    RIGHT FOOT FIFTH TOE   Renal disorder     History reviewed. No pertinent family history.  SOCIAL HISTORY: Social History   Tobacco Use   Smoking status: Former    Years: 25.00    Types: Cigarettes    Quit date: 2021    Years since quitting: 2.6   Smokeless tobacco: Never  Substance Use Topics   Alcohol use: Not Currently    Allergies  Allergen Reactions   Furosemide Swelling   Heparin Other (See Comments)    Patient relates vitreous hemorrhage after heparin. Patient relates vitreous  hemorrhage after heparin.   Insulin Aspart Swelling    States she takes insulin it just has mild side effects    Current Outpatient Medications  Medication Sig Dispense Refill   albuterol (ACCUNEB) 0.63 MG/3ML nebulizer solution Take 3 mLs (0.63 mg dose) by nebulization every 6 (six) hours as needed for Wheezing.     Alcohol Swabs (DROPSAFE ALCOHOL PREP) 70 % PADS Apply topically.     amLODipine (NORVASC) 10 MG tablet Take 1 tablet (10 mg total) by mouth daily. 30 tablet 0   B Complex-C-Folic Acid (RENA-VITE PO) Take 1 tablet by mouth daily.      Blood Glucose Monitoring Suppl (ACCU-CHEK AVIVA PLUS) w/Device KIT by Does not apply route.     calcium acetate (PHOSLO) 667 MG capsule Take 1,334 mg by mouth 3 (three) times daily with meals.      Cholecalciferol 25 MCG (1000 UT) tablet Take 1,000 Units by mouth daily.      cholestyramine (QUESTRAN) 4 g packet      cinacalcet (SENSIPAR) 60 MG tablet Take by mouth.     colestipol (COLESTID) 1 g tablet Take by mouth.     collagenase (SANTYL) ointment Apply 1 application topically daily. 15 g 5   cyclobenzaprine (FLEXERIL) 10 MG tablet TAKE 1 TABLET BY MOUTH 3 TIMES A DAY AS NEEDED FOR MUSCLE SPASMS FOR 10  DAYS  0   diclofenac sodium (VOLTAREN) 1 % GEL Place onto the skin.     diclofenac sodium (VOLTAREN) 1 % GEL PLACE ONTO THE SKIN 4 (FOUR) TIMES A DAY AS NEEDED.  1   dicyclomine (BENTYL) 20 MG tablet Take by mouth.     diphenhydrAMINE (BENADRYL) 25 MG tablet Take 25 mg by mouth daily as needed (seasonal allergies).     erythromycin ophthalmic ointment APPLY SMALL THIN LAYER TO UPPER AND LOWER EYE LIDS.  0   erythromycin with ethanol (THERAMYCIN) 2 % external solution Apply thin layer to acne prone areas in the morning     ethyl chloride spray See admin instructions.  2   famotidine (PEPCID) 20 MG tablet Take 20 mg by mouth See admin instructions. Take one tablet (20 mg) by mouth every morning, take an additional tablet (20 mg) at night if needed  for heartburn     FLOWFLEX COVID-19 AG HOME TEST KIT EVERY DAY     fluconazole (DIFLUCAN) 150 MG tablet TAKE 1 TABLET BY MOUTH FOR 1 DOSE     fluticasone (FLONASE) 50 MCG/ACT nasal spray Place 1-2 sprays into both nostrils daily. 16 g 0   FOSRENOL 1000 MG PACK      gabapentin (NEURONTIN) 100 MG capsule Take 100 mg by mouth 3 (three) times daily.      gabapentin (NEURONTIN) 100 MG capsule Take by mouth.     glucagon 1 MG injection Inject into the skin.     glucose blood (ACCU-CHEK AVIVA PLUS) test strip USE TO MONITOR BLOOD GLUCOSE 4 TIME(S) DAILY     hydrALAZINE (APRESOLINE) 100 MG tablet Take 100 mg by mouth every 8 (eight) hours.      hydrALAZINE (APRESOLINE) 50 MG tablet Take by mouth.     insulin aspart (NOVOLOG) 100 UNIT/ML FlexPen Inject 2-6 Units into the skin 3 (three) times daily as needed for high blood sugar (CBG>100).     insulin glargine (LANTUS) 100 UNIT/ML Solostar Pen Inject 10 Units into the skin at bedtime.     Insulin Pen Needle (BD PEN NEEDLE NANO U/F) 32G X 4 MM MISC Use with insulin 4x/day     ketoconazole (NIZORAL) 2 % shampoo Apply topically.     ketorolac (ACULAR) 0.4 % SOLN      labetalol (NORMODYNE) 200 MG tablet Take 400 mg by mouth every 8 (eight) hours.      loperamide (IMODIUM) 2 MG capsule      LORazepam (ATIVAN) 0.5 MG tablet Take 0.5 mg by mouth See admin instructions. Take one tablet (0.5 mg) by mouth 30 minutes prior dialysis on Monday, Wednesday, Friday     losartan (COZAAR) 100 MG tablet Take 1 tablet (100 mg total) by mouth daily. 30 tablet 0   Methoxy PEG-Epoetin Beta 100 MCG/0.3ML SOSY Inject as directed.     Misc. Devices MISC Dispense 1 rollator with seat and adjustable height. Dx:R26.2, Z89.421, R53.81, Z91.81     mometasone (ELOCON) 0.1 % ointment      nicotine (NICODERM CQ - DOSED IN MG/24 HOURS) 14 mg/24hr patch Place onto the skin.     ofloxacin (OCUFLOX) 0.3 % ophthalmic solution      oxyCODONE-acetaminophen (PERCOCET/ROXICET) 5-325 MG tablet  Take 1 tablet by mouth every 6 (six) hours as needed.     polyvinyl alcohol (LIQUIFILM TEARS) 1.4 % ophthalmic solution Place 1 drop into both eyes 2 (two) times daily.     prednisoLONE acetate (PRED FORTE) 1 % ophthalmic suspension  Respiratory Therapy Supplies (NEBULIZER) DEVI Use as directed every 4-6 hours as needed     trimethoprim-polymyxin b (POLYTRIM) ophthalmic solution PLACE ONE DROP INTO BOTH EYES EVERY 4 (FOUR) HOURS FOR 7 DAYS.  0   pantoprazole (PROTONIX) 40 MG tablet Take by mouth.     No current facility-administered medications for this visit.    REVIEW OF SYSTEMS:  [X]  denotes positive finding, [ ]  denotes negative finding Cardiac  Comments:  Chest pain or chest pressure:    Shortness of breath upon exertion: X   Short of breath when lying flat:    Irregular heart rhythm:        Vascular    Pain in calf, thigh, or hip brought on by ambulation:    Pain in feet at night that wakes you up from your sleep:     Blood clot in your veins:    Leg swelling:         Pulmonary    Oxygen at home:    Productive cough:     Wheezing:         Neurologic    Sudden weakness in arms or legs:     Sudden numbness in arms or legs:     Sudden onset of difficulty speaking or slurred speech:    Temporary loss of vision in one eye:     Problems with dizziness:         Gastrointestinal    Blood in stool:     Vomited blood:         Genitourinary    Burning when urinating:     Blood in urine:        Psychiatric    Major depression:         Hematologic    Bleeding problems:    Problems with blood clotting too easily:        Skin    Rashes or ulcers:        Constitutional    Fever or chills:     PHYSICAL EXAM:   Vitals:   08/01/22 1353  BP: (!) 144/79  Pulse: 73  Resp: 20  Temp: 98 F (36.7 C)  SpO2: 95%  Weight: 189 lb (85.7 kg)  Height: 5' 5"  (1.651 m)    GENERAL: The patient is a well-nourished female, in no acute distress. The vital signs are  documented above. CARDIAC: There is a regular rate and rhythm.  VASCULAR: I do not detect carotid bruits. Her fistula has a good thrill. She has a aneurysms as noted in the photograph below.   She has a palpable left radial pulse. PULMONARY: There is good air exchange bilaterally without wheezing or rales. ABDOMEN: Soft and non-tender with normal pitched bowel sounds.  MUSCULOSKELETAL: There are no major deformities or cyanosis. NEUROLOGIC: No focal weakness or paresthesias are detected. SKIN: There are no ulcers or rashes noted. PSYCHIATRIC: The patient has a normal affect.  DATA:    No new data  Deitra Mayo [facility] [phone]

## 2022-08-02 ENCOUNTER — Encounter (HOSPITAL_COMMUNITY): Payer: Self-pay | Admitting: Vascular Surgery

## 2022-08-02 ENCOUNTER — Other Ambulatory Visit: Payer: Self-pay

## 2022-08-02 DIAGNOSIS — N186 End stage renal disease: Secondary | ICD-10-CM

## 2022-08-02 NOTE — Progress Notes (Signed)
PCP - Levell July, PA-C Cardiologist - n/a  Chest x-ray - 12/10/21 (2V) CT Chest Xray - 07/16/22 CE EKG - 11/05/21 Stress Test - n/a ECHO - n/a Cardiac Cath - n/a  ICD Pacemaker/Loop - n/a  Sleep Study -  yes CPAP - does not used cpap     THE MORNING OF SURGERY, do not take Novolog Insulin unless your CBG is greater than 220 mg/dL.  If CBG greater than 220 mg/dL, you may take  of your sliding scale (correction) dose of insulin.  THE NIGHT BEFORE SURGERY, take 10 units of Lantus Insulin.    If your blood sugar is less than 70 mg/dL, you will need to treat for low blood sugar: Treat a low blood sugar (less than 70 mg/dL) with  cup of clear juice (cranberry or apple), 4 glucose tablets, OR glucose gel. Recheck blood sugar in 15 minutes after treatment (to make sure it is greater than 70 mg/dL). If your blood sugar is not greater than 70 mg/dL on recheck, call [phone] for further instructions.  Anesthesia review: Yes  STOP now taking any Aspirin (unless otherwise instructed by your surgeon), Aleve, Naproxen, Ibuprofen, Motrin, Advil, Goody's, BC's, all herbal medications, fish oil, and all vitamins.   Coronavirus Screening Do you have any of the following symptoms:  Cough yes/no: No Fever (>100.71F)  yes/no: No Runny nose yes/no: No Sore throat yes/no: No Difficulty breathing/shortness of breath  yes/no: No  Have you traveled in the last 14 days and where? yes/no: No  Patient verbalized understanding of instructions that were given via phone.

## 2022-08-02 NOTE — Progress Notes (Signed)
Anesthesia Chart Review: Karen [name]  Case: [id number] Date/Time: 08/06/22 1132   Procedures:      REVISON OF LEFT ARTERIOVENOUS FISTULA (Left)     INSERTION OF TUNNELED DIALYSIS CATHETER   Anesthesia type: Choice   Pre-op diagnosis: ESRD   Location: [facility] OR ROOM 12 / [facility]   Surgeons: Angelia Mould, MD       DISCUSSION: Patient is a 46 year old female scheduled for the above procedure. 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 She has hemodialysis of MWF.  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 Colonoscopy 03/11/22 showed colon polyps and hemorrhoids.  She had thoracic surgical oncology evaluation at [facility] by Mal Misty, MD on 07/16/22. By notes, she has a "Left lower lobe indeterminate lung nodule concerning for stage 1 lung cancer." After review of options, she desired to proceed with left VATS, possible left thoracotomy, LLL wedge resection versus lobectomy. She added, "PFTs are borderline acceptable for lobectomy." Surgery is currently scheduled for 08/22/2022.  She also had recent GYN ONC evaluation at [facility] by Dr. Clarene Essex due to a 12 cm pelvic mass with elevated HE4 in setting of ESRD/HD. Given her past surgical history and the size of her mass, open surgery with removal of the mass intact was recommended. Diagnostic laparoscopy, exploratory laparotomy, unilateral salpingo-oophorectomy, possible bilateral salpingo-oophorectomy, possible staging, possible debulking is scheduled for 08/08/22.   VS: Ht 5\' 5"  (1.651 m)   Wt 86.2 kg   BMI 31.62 kg/m  BP Readings from Last 3 Encounters:  08/01/22 (!) 144/79  12/11/21 (!) 164/73  11/06/21 128/61   Pulse Readings from Last 3 Encounters:  08/01/22 73  12/11/21 81  11/06/21 84     PROVIDERS: Wynn Banker, PA is PCP  Cindie Laroche, MD is GYN ONC [facility]) Mal Misty, MD is thoracic  surgical oncologist [facility]) Shelby Mattocks, MD is rheumatologist [facility]). Seen 07/03/22 for fibromyalgia.  Susette Racer, MD is endocrinologist [facility])   LABS: For day of procedure. As of 06/11/22, H/H 10.7/32.6, PLT 169. A1c 9.3% on 03/28/22 [facility]).   IMAGES: CT Chest 07/16/22 [facility]): FINDINGS:  - LUNGS, AIRWAYS, AND PLEURA: Unchanged 1.0 cm nodule at the left lung base (2:109). Unchanged scattered micronodules. Central airways are patent. No pleural effusion or pneumothorax.  - MEDIASTINUM AND LYMPH NODES: No enlarged intrathoracic lymph nodes. No other mediastinal abnormality.  - HEART AND VASCULATURE: Cardiac chambers are normal in size. Extensive coronary artery calcifications. No pericardial effusion. Aorta is normal in caliber. Main pulmonary artery is normal in size.  - CHEST WALL AND BONES: Unremarkable.  - UPPER ABDOMEN: Atrophic calcified right kidney. Splenomegaly.  - OTHER: No actionable thyroid nodule.  - DEVICES: None.  IMPRESSION:  Unchanged 1.0 cm left lower lobe nodule.  US Renal 03/28/22 [facility]): Impression: - Severely atrophic right kidney and heterogeneous left kidney with increased echogenicity, compatible with chronic medical renal disease.  - Large pelvic cyst measuring up to 12.0 cm, better evaluated on prior CT.  CT Abd/pelvis 06/26/21 [facility]): IMPRESSION: -Large left ovarian mass, concerning for cystic epithelial neoplasm such as serous cystadenoma. Given size, recommend MRI for further evaluation.   -Marked atrophy of the pancreatic body and tail. While this finding is nonspecific, would recommend MRI abdomen and pelvis to evaluate both the known ovarian lesion and a possible underlying pancreatic lesion.   -Atrophic  right kidney. There is left-sided perinephric stranding. Nonspecific, however recommend correlation with UA to exclude underlying infection.   -Mild wall thickening of the bladder. Again, recommend UA to exclude underlying infection.    -Thickening of the rectum and sigmoid colon, which is nonspecific but can be seen in colitis.   -Splenomegaly     EKG: 03/28/22: Per Result Narrative in [place]: NORMAL SINUS RHYTHM  NONSPECIFIC ST AND T WAVE ABNORMALITY  ABNORMAL ECG  WHEN COMPARED WITH ECG OF 12-Oct-2020 13:47,  NO SIGNIFICANT CHANGE WAS FOUND  Confirmed by Lovena Neighbours [phone]) on 03/30/2022 9:15:41 AM   CV: Echo 03/28/22 ([facility]): Summary    1. The left ventricle is normal in size with normal wall thickness.    2. The left ventricular systolic function is normal, LVEF is visually  estimated at 55-60%.    3. The aortic valve is trileaflet with mildly thickened leaflets with normal  excursion.    4. The right ventricle is normal in size, with normal systolic function.    5. IVC size and inspiratory change suggest mildly elevated right atrial  pressure. (5-10 mmHg).     Nuclear stress test 09/25/21 [facility]; reason: "pre-op renal transplant"): IMPRESSIONS: - Probably normal myocardial perfusion study  - There is a very small, mildly severe, fixed perfusion defect involving the apical segment. This is consistent with probable normal variant and / or artifact (motion and misregistration of attenuation CT).  - Left ventricular systolic function is normal. Post stress the ejection fraction is > 60%.  - Coronary calcifications are noted  - Mildly dilated main pulmonary artery.  - Atrophic right kidney and cholecystectomy as noted on previous cross-sectional imaging.    Past Medical History:  Diagnosis Date   Diabetes mellitus without complication ([place])    type 2   Hypertension    Osteomyelitis ([place])    RIGHT FOOT FIFTH TOE   Renal disorder    stage 4    Past Surgical History:  Procedure Laterality Date   ABDOMINAL AORTOGRAM W/LOWER EXTREMITY N/A 06/08/2018   Procedure: ABDOMINAL AORTOGRAM W/LOWER EXTREMITY;  Surgeon: Waynetta Sandy, MD;  Location: [facility];  Service:  Cardiovascular;  Laterality: N/A;   ABDOMINAL HYSTERECTOMY  09/2016   AMPUTATION Right 06/11/2018   Procedure: AMPUTATION RIGHT FIFTH TOE;  Surgeon: Angelia Mould, MD;  Location: [facility] OR;  Service: Vascular;  Laterality: Right;   APPENDECTOMY     AV FISTULA PLACEMENT Left    CHOLECYSTECTOMY     COLON SURGERY      MEDICATIONS: No current facility-administered medications for this encounter.    albuterol (VENTOLIN HFA) 108 (90 Base) MCG/ACT inhaler   amLODipine (NORVASC) 10 MG tablet   B Complex-C-Folic Acid (RENA-VITE PO)   cinacalcet (SENSIPAR) 30 MG tablet   dicyclomine (BENTYL) 20 MG tablet   erythromycin with ethanol (THERAMYCIN) 2 % external solution   ethyl chloride spray   FOSRENOL 1000 MG PACK   gabapentin (NEURONTIN) 100 MG capsule   hydrALAZINE (APRESOLINE) 50 MG tablet   insulin aspart (NOVOLOG) 100 UNIT/ML FlexPen   insulin glargine (LANTUS) 100 UNIT/ML Solostar Pen   ketoconazole (NIZORAL) 2 % shampoo   labetalol (NORMODYNE) 200 MG tablet   loperamide (IMODIUM) 2 MG capsule   glucagon 1 MG injection   Insulin Pen Needle (BD PEN NEEDLE NANO U/F) 32G X 4 MM MISC   Respiratory Therapy Supplies (NEBULIZER) DEVI    Myra Gianotti, PA-C Surgical Short Stay/Anesthesiology [facility] Phone [phone])  [phone] [facility] Phone [phone] 08/02/2022 4:54 PM

## 2022-08-06 ENCOUNTER — Ambulatory Visit (HOSPITAL_BASED_OUTPATIENT_CLINIC_OR_DEPARTMENT_OTHER): Payer: Medicare HMO | Admitting: Vascular Surgery

## 2022-08-06 ENCOUNTER — Ambulatory Visit (HOSPITAL_COMMUNITY): Payer: Medicare HMO

## 2022-08-06 ENCOUNTER — Ambulatory Visit (HOSPITAL_COMMUNITY): Payer: Medicare HMO | Admitting: Vascular Surgery

## 2022-08-06 ENCOUNTER — Ambulatory Visit (HOSPITAL_COMMUNITY)
Admission: RE | Admit: 2022-08-06 | Discharge: 2022-08-06 | Disposition: A | Discharge status: Home / Self Care | Payer: Medicare HMO | Attending: Vascular Surgery | Admitting: Vascular Surgery

## 2022-08-06 ENCOUNTER — Other Ambulatory Visit: Payer: Self-pay

## 2022-08-06 ENCOUNTER — Encounter (HOSPITAL_COMMUNITY)
Admission: RE | Disposition: A | Discharge status: Home / Self Care | Payer: Self-pay | Source: Home / Self Care | Attending: Vascular Surgery

## 2022-08-06 ENCOUNTER — Encounter (HOSPITAL_COMMUNITY): Payer: Self-pay | Admitting: Vascular Surgery

## 2022-08-06 DIAGNOSIS — Z992 Dependence on renal dialysis: Secondary | ICD-10-CM | POA: Diagnosis not present

## 2022-08-06 DIAGNOSIS — Z87891 Personal history of nicotine dependence: Secondary | ICD-10-CM

## 2022-08-06 DIAGNOSIS — N186 End stage renal disease: Secondary | ICD-10-CM | POA: Diagnosis not present

## 2022-08-06 DIAGNOSIS — I132 Hypertensive heart and chronic kidney disease with heart failure and with stage 5 chronic kidney disease, or end stage renal disease: Secondary | ICD-10-CM | POA: Insufficient documentation

## 2022-08-06 DIAGNOSIS — I10 Essential (primary) hypertension: Secondary | ICD-10-CM

## 2022-08-06 DIAGNOSIS — T82898A Other specified complication of vascular prosthetic devices, implants and grafts, initial encounter: Secondary | ICD-10-CM | POA: Diagnosis present

## 2022-08-06 DIAGNOSIS — Z794 Long term (current) use of insulin: Secondary | ICD-10-CM | POA: Insufficient documentation

## 2022-08-06 DIAGNOSIS — I770 Arteriovenous fistula, acquired: Secondary | ICD-10-CM

## 2022-08-06 DIAGNOSIS — E119 Type 2 diabetes mellitus without complications: Secondary | ICD-10-CM | POA: Diagnosis not present

## 2022-08-06 DIAGNOSIS — E1122 Type 2 diabetes mellitus with diabetic chronic kidney disease: Secondary | ICD-10-CM | POA: Insufficient documentation

## 2022-08-06 DIAGNOSIS — I509 Heart failure, unspecified: Secondary | ICD-10-CM | POA: Diagnosis not present

## 2022-08-06 DIAGNOSIS — Y841 Kidney dialysis as the cause of abnormal reaction of the patient, or of later complication, without mention of misadventure at the time of the procedure: Secondary | ICD-10-CM | POA: Diagnosis not present

## 2022-08-06 HISTORY — DX: Gastro-esophageal reflux disease without esophagitis: K21.9

## 2022-08-06 HISTORY — PX: INSERTION OF DIALYSIS CATHETER: SHX1324

## 2022-08-06 HISTORY — DX: Solitary pulmonary nodule: R91.1

## 2022-08-06 HISTORY — DX: Attention-deficit hyperactivity disorder, unspecified type: F90.9

## 2022-08-06 HISTORY — DX: Sleep apnea, unspecified: G47.30

## 2022-08-06 HISTORY — DX: Fibromyalgia: M79.7

## 2022-08-06 HISTORY — DX: Unspecified osteoarthritis, unspecified site: M19.90

## 2022-08-06 HISTORY — DX: Dyspnea, unspecified: R06.00

## 2022-08-06 HISTORY — PX: REVISON OF ARTERIOVENOUS FISTULA: SHX6074

## 2022-08-06 HISTORY — DX: Unspecified asthma, uncomplicated: J45.909

## 2022-08-06 HISTORY — DX: Anemia, unspecified: D64.9

## 2022-08-06 LAB — POCT I-STAT, CHEM 8
BUN: 45 mg/dL — ABNORMAL HIGH (ref 6–20)
Calcium, Ion: 1.12 mmol/L — ABNORMAL LOW (ref 1.15–1.40)
Chloride: 104 mmol/L (ref 98–111)
Creatinine, Ser: 6.4 mg/dL — ABNORMAL HIGH (ref 0.44–1.00)
Glucose, Bld: 155 mg/dL — ABNORMAL HIGH (ref 70–99)
HCT: 33 % — ABNORMAL LOW (ref 36.0–46.0)
Hemoglobin: 11.2 g/dL — ABNORMAL LOW (ref 12.0–15.0)
Potassium: 4.8 mmol/L (ref 3.5–5.1)
Sodium: 138 mmol/L (ref 135–145)
TCO2: 23 mmol/L (ref 22–32)

## 2022-08-06 LAB — SURGICAL PCR SCREEN
MRSA, PCR: NEGATIVE
Staphylococcus aureus: NEGATIVE

## 2022-08-06 LAB — GLUCOSE, CAPILLARY
Glucose-Capillary: 143 mg/dL — ABNORMAL HIGH (ref 70–99)
Glucose-Capillary: 168 mg/dL — ABNORMAL HIGH (ref 70–99)
Glucose-Capillary: 123 mg/dL — ABNORMAL HIGH (ref 70–99)
Glucose-Capillary: 152 mg/dL — ABNORMAL HIGH (ref 70–99)

## 2022-08-06 SURGERY — REVISON OF ARTERIOVENOUS FISTULA
Anesthesia: Monitor Anesthesia Care | Site: Chest

## 2022-08-06 MED ORDER — LIDOCAINE-EPINEPHRINE (PF) 1 %-1:200000 IJ SOLN
INTRAMUSCULAR | Status: DC | PRN
  Administered 2022-08-06: 30 mL

## 2022-08-06 MED ORDER — EPHEDRINE SULFATE-NACL 50-0.9 MG/10ML-% IV SOSY
PREFILLED_SYRINGE | INTRAVENOUS | Status: DC | PRN
  Administered 2022-08-06 (×2): 10 mg via INTRAVENOUS
  Administered 2022-08-06: 5 mg via INTRAVENOUS

## 2022-08-06 MED ORDER — FENTANYL CITRATE (PF) 100 MCG/2ML IJ SOLN
INTRAMUSCULAR | Status: DC | PRN
Start: 2022-08-06 — End: 2022-08-06
  Administered 2022-08-06 (×3): 25 ug via INTRAVENOUS

## 2022-08-06 MED ORDER — ONDANSETRON HCL 4 MG/2ML IJ SOLN
INTRAMUSCULAR | Status: DC | PRN
  Administered 2022-08-06: 4 mg via INTRAVENOUS

## 2022-08-06 MED ORDER — SODIUM CHLORIDE (PF) 0.9 % IJ SOLN
INTRAMUSCULAR | Status: DC | PRN
  Administered 2022-08-06: 3.2 mL

## 2022-08-06 MED ORDER — CHLORHEXIDINE GLUCONATE 4 % EX LIQD: 60.0000 mL | Freq: Once | CUTANEOUS | Status: DC

## 2022-08-06 MED ORDER — INSULIN ASPART 100 UNIT/ML IJ SOLN
INTRAMUSCULAR | Status: AC
  Filled 2022-08-06: qty 1

## 2022-08-06 MED ORDER — INSULIN ASPART 100 UNIT/ML IJ SOLN
0.0000 [IU] | INTRAMUSCULAR | Status: AC | PRN
  Administered 2022-08-06 (×2): 2 [IU] via SUBCUTANEOUS

## 2022-08-06 MED ORDER — HEPARIN 6000 UNIT IRRIGATION SOLUTION
Status: AC
  Filled 2022-08-06: qty 500

## 2022-08-06 MED ORDER — LIDOCAINE-EPINEPHRINE (PF) 1 %-1:200000 IJ SOLN
INTRAMUSCULAR | Status: AC
  Filled 2022-08-06: qty 30

## 2022-08-06 MED ORDER — HEPARIN SODIUM (PORCINE) 1000 UNIT/ML IJ SOLN
INTRAMUSCULAR | Status: AC
  Filled 2022-08-06: qty 10

## 2022-08-06 MED ORDER — PHENYLEPHRINE HCL-NACL 20-0.9 MG/250ML-% IV SOLN
INTRAVENOUS | Status: DC | PRN
  Administered 2022-08-06: 75 ug/min via INTRAVENOUS

## 2022-08-06 MED ORDER — CHLORHEXIDINE GLUCONATE 0.12 % MT SOLN
15.0000 mL | Freq: Once | OROMUCOSAL | Status: AC
Start: 2022-08-06 — End: 2022-08-06

## 2022-08-06 MED ORDER — CEFAZOLIN SODIUM-DEXTROSE 2-4 GM/100ML-% IV SOLN
2.0000 g | INTRAVENOUS | Status: AC
  Administered 2022-08-06: 2 g via INTRAVENOUS
  Filled 2022-08-06: qty 100

## 2022-08-06 MED ORDER — MIDAZOLAM HCL 2 MG/2ML IJ SOLN
INTRAMUSCULAR | Status: AC
  Filled 2022-08-06: qty 2

## 2022-08-06 MED ORDER — MIDAZOLAM HCL 5 MG/5ML IJ SOLN
INTRAMUSCULAR | Status: DC | PRN
  Administered 2022-08-06: 2 mg via INTRAVENOUS

## 2022-08-06 MED ORDER — HYDROCODONE-ACETAMINOPHEN 5-325 MG PO TABS: 1.0000 | ORAL_TABLET | Freq: Four times a day (QID) | ORAL | 0 refills | Status: DC | PRN

## 2022-08-06 MED ORDER — ALBUMIN HUMAN 5 % IV SOLN: INTRAVENOUS | Status: DC | PRN

## 2022-08-06 MED ORDER — LIDOCAINE HCL (PF) 1 % IJ SOLN
INTRAMUSCULAR | Status: AC
Start: 2022-08-06 — End: ?
  Filled 2022-08-06: qty 30

## 2022-08-06 MED ORDER — PROPOFOL 10 MG/ML IV BOLUS
INTRAVENOUS | Status: DC | PRN
  Administered 2022-08-06: 200 mg via INTRAVENOUS

## 2022-08-06 MED ORDER — 0.9 % SODIUM CHLORIDE (POUR BTL) OPTIME
TOPICAL | Status: DC | PRN
  Administered 2022-08-06: 1000 mL

## 2022-08-06 MED ORDER — PHENYLEPHRINE 80 MCG/ML (10ML) SYRINGE FOR IV PUSH (FOR BLOOD PRESSURE SUPPORT)
PREFILLED_SYRINGE | INTRAVENOUS | Status: AC
Start: 2022-08-06 — End: ?
  Filled 2022-08-06: qty 20

## 2022-08-06 MED ORDER — EPHEDRINE 5 MG/ML INJ
INTRAVENOUS | Status: AC
Start: 2022-08-06 — End: ?
  Filled 2022-08-06: qty 5

## 2022-08-06 MED ORDER — ORAL CARE MOUTH RINSE: 15.0000 mL | Freq: Once | OROMUCOSAL | Status: AC

## 2022-08-06 MED ORDER — SODIUM CHLORIDE 0.9 % IV SOLN: INTRAVENOUS | Status: DC

## 2022-08-06 MED ORDER — DEXAMETHASONE SODIUM PHOSPHATE 10 MG/ML IJ SOLN
INTRAMUSCULAR | Status: DC | PRN
  Administered 2022-08-06: 4 mg via INTRAVENOUS

## 2022-08-06 MED ORDER — LIDOCAINE HCL (PF) 1 % IJ SOLN
INTRAMUSCULAR | Status: DC | PRN
  Administered 2022-08-06: 1 mL

## 2022-08-06 MED ORDER — PHENYLEPHRINE 80 MCG/ML (10ML) SYRINGE FOR IV PUSH (FOR BLOOD PRESSURE SUPPORT)
PREFILLED_SYRINGE | INTRAVENOUS | Status: DC | PRN
  Administered 2022-08-06: 80 ug via INTRAVENOUS
  Administered 2022-08-06: 160 ug via INTRAVENOUS
  Administered 2022-08-06: 80 ug via INTRAVENOUS
  Administered 2022-08-06 (×3): 160 ug via INTRAVENOUS

## 2022-08-06 MED ORDER — LIDOCAINE 2% (20 MG/ML) 5 ML SYRINGE
INTRAMUSCULAR | Status: DC | PRN
  Administered 2022-08-06: 100 mg via INTRAVENOUS

## 2022-08-06 MED ORDER — CHLORHEXIDINE GLUCONATE 0.12 % MT SOLN
OROMUCOSAL | Status: AC
  Administered 2022-08-06: 15 mL via OROMUCOSAL
  Filled 2022-08-06: qty 15

## 2022-08-06 MED ORDER — OXYCODONE-ACETAMINOPHEN 5-325 MG PO TABS: 1.0000 | ORAL_TABLET | ORAL | 0 refills | Status: AC | PRN

## 2022-08-06 SURGICAL SUPPLY — 51 items
ARMBAND PINK RESTRICT EXTREMIT (MISCELLANEOUS) ×2 IMPLANT
BAG COUNTER SPONGE SURGICOUNT (BAG) ×2 IMPLANT
BAG DECANTER FOR FLEXI CONT (MISCELLANEOUS) ×2 IMPLANT
BIOPATCH RED 1 DISK 7.0 (GAUZE/BANDAGES/DRESSINGS) ×2 IMPLANT
CANISTER SUCT 3000ML PPV (MISCELLANEOUS) ×2 IMPLANT
CANNULA VESSEL 3MM 2 BLNT TIP (CANNULA) ×2 IMPLANT
CATH PALINDROME-P 19CM W/VT (CATHETERS) IMPLANT
CHLORAPREP W/TINT 26 (MISCELLANEOUS) ×2 IMPLANT
CLIP VESOCCLUDE MED 6/CT (CLIP) ×2 IMPLANT
CLIP VESOCCLUDE SM WIDE 6/CT (CLIP) ×2 IMPLANT
COVER PROBE W GEL 5X96 (DRAPES) IMPLANT
COVER SURGICAL LIGHT HANDLE (MISCELLANEOUS) ×2 IMPLANT
DERMABOND ADVANCED (GAUZE/BANDAGES/DRESSINGS) ×4
DERMABOND ADVANCED .7 DNX12 (GAUZE/BANDAGES/DRESSINGS) IMPLANT
DRAPE C-ARM 42X72 X-RAY (DRAPES) ×2 IMPLANT
DRAPE CHEST BREAST 15X10 FENES (DRAPES) ×2 IMPLANT
DRSG COVADERM 4X6 (GAUZE/BANDAGES/DRESSINGS) IMPLANT
ELECT REM PT RETURN 9FT ADLT (ELECTROSURGICAL) ×2
ELECTRODE REM PT RTRN 9FT ADLT (ELECTROSURGICAL) ×2 IMPLANT
GAUZE 4X4 16PLY ~~LOC~~+RFID DBL (SPONGE) ×2 IMPLANT
GLOVE BIO SURGEON STRL SZ7.5 (GLOVE) ×2 IMPLANT
GLOVE BIOGEL PI IND STRL 8 (GLOVE) ×2 IMPLANT
GLOVE SURG UNDER LTX SZ8 (GLOVE) ×2 IMPLANT
GOWN STRL REUS W/ TWL LRG LVL3 (GOWN DISPOSABLE) ×6 IMPLANT
GOWN STRL REUS W/TWL LRG LVL3 (GOWN DISPOSABLE) ×6
KIT BASIN OR (CUSTOM PROCEDURE TRAY) ×2 IMPLANT
KIT TURNOVER KIT B (KITS) ×2 IMPLANT
NDL 18GX1X1/2 (RX/OR ONLY) (NEEDLE) ×2 IMPLANT
NDL HYPO 25GX1X1/2 BEV (NEEDLE) ×2 IMPLANT
NEEDLE 18GX1X1/2 (RX/OR ONLY) (NEEDLE) ×2 IMPLANT
NEEDLE HYPO 25GX1X1/2 BEV (NEEDLE) ×2 IMPLANT
NS IRRIG 1000ML POUR BTL (IV SOLUTION) ×2 IMPLANT
PACK BASIC III (CUSTOM PROCEDURE TRAY)
PACK CV ACCESS (CUSTOM PROCEDURE TRAY) ×2 IMPLANT
PACK SRG BSC III STRL LF ECLPS (CUSTOM PROCEDURE TRAY) ×2 IMPLANT
PAD ARMBOARD 7.5X6 YLW CONV (MISCELLANEOUS) ×4 IMPLANT
SET MICROPUNCTURE 5F STIFF (MISCELLANEOUS) IMPLANT
SUT ETHILON 3 0 PS 1 (SUTURE) ×2 IMPLANT
SUT MNCRL AB 4-0 PS2 18 (SUTURE) ×2 IMPLANT
SUT PROLENE 5 0 C 1 24 (SUTURE) IMPLANT
SUT PROLENE 6 0 BV (SUTURE) ×2 IMPLANT
SUT VIC AB 3-0 SH 27 (SUTURE) ×4
SUT VIC AB 3-0 SH 27X BRD (SUTURE) ×2 IMPLANT
SYR 10ML LL (SYRINGE) ×2 IMPLANT
SYR 20ML LL LF (SYRINGE) ×4 IMPLANT
SYR 5ML LL (SYRINGE) ×4 IMPLANT
SYR CONTROL 10ML LL (SYRINGE) ×2 IMPLANT
TOWEL GREEN STERILE (TOWEL DISPOSABLE) ×4 IMPLANT
TOWEL GREEN STERILE FF (TOWEL DISPOSABLE) ×2 IMPLANT
UNDERPAD 30X36 HEAVY ABSORB (UNDERPADS AND DIAPERS) ×2 IMPLANT
WATER STERILE IRR 1000ML POUR (IV SOLUTION) ×2 IMPLANT

## 2022-08-06 NOTE — Interval H&P Note (Signed)
History and Physical Interval Note:  08/06/2022 12:20 PM  [facility]  has presented today for surgery, with the diagnosis of ESRD.  The various methods of treatment have been discussed with the patient and family. After consideration of risks, benefits and other options for treatment, the patient has consented to  Procedure(s): REVISON OF LEFT ARTERIOVENOUS FISTULA (Left) INSERTION OF TUNNELED DIALYSIS CATHETER (N/A) as a surgical intervention.  The patient's history has been reviewed, patient examined, no change in status, stable for surgery.  I have reviewed the patient's chart and labs.  Questions were answered to the patient's satisfaction.     Karen [name]

## 2022-08-06 NOTE — Transfer of Care (Signed)
Immediate Anesthesia Transfer of Care Note  Patient: [facility]  Procedure(s) Performed: REVISON OF LEFT ARTERIOVENOUS FISTULA WITH REPAIR OF PSEUDOANEURYSMS AND SUPERFISTULIZATIONS (Left: Arm Upper) INSERTION OF RIGHT INTERNAL JUGULAR TUNNELED DIALYSIS CATHETER (Chest)  Patient Location: PACU  Anesthesia Type:General  Level of Consciousness: awake, alert  and oriented  Airway & Oxygen Therapy: Patient Spontanous Breathing and Patient connected to nasal cannula oxygen  Post-op Assessment: Report given to RN and Post -op Vital signs reviewed and stable  Post vital signs: Reviewed and stable  Last Vitals:  Vitals Value Taken Time  BP 110/59 08/06/22 1600  Temp    Pulse 67 08/06/22 1604  Resp 25 08/06/22 1604  SpO2 91 % 08/06/22 1604  Vitals shown include unvalidated device data.  Last Pain:  Vitals:   08/06/22 0939  TempSrc:   PainSc: 6       Patients Stated Pain Goal: 0 (25/00/37 0488)  Complications: No notable events documented.

## 2022-08-06 NOTE — Anesthesia Postprocedure Evaluation (Deleted)
Anesthesia Post Note  Patient: Oncologist  Procedure(s) Performed: REVISON OF LEFT ARTERIOVENOUS FISTULA WITH REPAIR OF PSEUDOANEURYSMS AND SUPERFISTULIZATIONS (Left: Arm Upper) INSERTION OF RIGHT INTERNAL JUGULAR TUNNELED DIALYSIS CATHETER (Chest)     Anesthesia Type: General Anesthetic complications: no   No notable events documented.  Last Vitals:  Vitals:   08/06/22 1630 08/06/22 1645  BP: 126/60 134/65  Pulse: 67 68  Resp: 15 12  Temp:  (!) 36.3 C  SpO2: 95% 93%    Last Pain:  Vitals:   08/06/22 1645  TempSrc:   PainSc: 0-No pain                 Karen [name]

## 2022-08-06 NOTE — Discharge Instructions (Signed)
° °  [facility] [place] ° °Discharge Instructions ° °AV Fistula or Graft Surgery for Dialysis Access ° °Please refer to the following instructions for your post-procedure care. Your surgeon or physician assistant will discuss any changes with you. ° °Activity ° °You may drive the day following your surgery, if you are comfortable and no longer taking prescription pain medication. Resume full activity as the soreness in your incision resolves. ° °Bathing/Showering ° °You may shower after you go home. Keep your incision dry for 48 hours. Do not soak in a bathtub, hot tub, or swim until the incision heals completely. You may not shower if you have a hemodialysis catheter. ° °Incision Care ° °Clean your incision with mild soap and water after 48 hours. Pat the area dry with a clean towel. You do not need a bandage unless otherwise instructed. Do not apply any ointments or creams to your incision. You may have skin glue on your incision. Do not peel it off. It will come off on its own in about one week. Your arm may swell a bit after surgery. To reduce swelling use pillows to elevate your arm so it is above your heart. Your doctor will tell you if you need to lightly wrap your arm with an ACE bandage. ° °Diet ° °Resume your normal diet. There are not special food restrictions following this procedure. In order to heal from your surgery, it is CRITICAL to get adequate nutrition. Your body requires vitamins, minerals, and protein. Vegetables are the best source of vitamins and minerals. Vegetables also provide the perfect balance of protein. Processed food has little nutritional value, so try to avoid this. ° °Medications ° °Resume taking all of your medications. If your incision is causing pain, you may take over-the counter pain relievers such as acetaminophen (Tylenol). If you were prescribed a stronger pain medication, please be aware these medications can cause nausea and constipation. Prevent  nausea by taking the medication with a snack or meal. Avoid constipation by drinking plenty of fluids and eating foods with high amount of fiber, such as fruits, vegetables, and grains. Do not take Tylenol if you are taking prescription pain medications. ° ° ° ° °Follow up °Your surgeon may want to see you in the office following your access surgery. If so, this will be arranged at the time of your surgery. ° °Please call us immediately for any of the following conditions: ° °Increased pain, redness, drainage (pus) from your incision site °Fever of 101 degrees or higher °Severe or worsening pain at your incision site °Hand pain or numbness. ° °Reduce your risk of vascular disease: ° °Stop smoking. If you would like help, call [facility] at 1-800-QUIT-NOW (1-800-784-8669) or [place] at 336-586-4000 ° °Manage your cholesterol °Maintain a desired weight °Control your diabetes °Keep your blood pressure down ° °Dialysis ° °It will take several weeks to several months for your new dialysis access to be ready for use. Your surgeon will determine when it is OK to use it. Your nephrologist will continue to direct your dialysis. You can continue to use your Permcath until your new access is ready for use. ° °If you have any questions, please call the office at [phone]. ° °

## 2022-08-06 NOTE — Op Note (Signed)
    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 Scot Dock, MD  ASSIST: Arlee Muslim, PA  ANESTHESIA: General  EBL: Minimal  INDICATIONS:    Karen [name] is a 46 y.o. female who presented with 2 aneurysms in her proximal left upper arm fistula.  She also states they were having a hard time cannulating the more central segments of the fistula.  She presents for revision.  FINDINGS:   The fistula should be able to be accessed in the upper half of the arm in 6 to 8 weeks where I superficialized the vein.  She should not stick in the proximal half of the fistula where the distal 2 incisions are.  TECHNIQUE:   The patient was taken to the operating room and received a general anesthetic.  The left arm was prepped and draped in usual sterile fashion.  I made 2 fishmouth incisions overlying the 2 aneurysms in her proximal arm and the aneurysms were dissected free here.  She has a heparin allergy.  I did not heparinize.  I used the Satinsky clamp to clamp the fistula and preserve some flow through the fistula.  The aneurysm was excised and I oversewed the vein with 5-0 Prolene suture.  This was done for each of the aneurysms.  At the completion there was a good thrill in the fistula.  I then elected to superficialize the fistula and the more central portion.  Using 2 additional incisions I mobilized the vein circumferentially all the way up to the shoulder.  Branches were divided tween clips and 3-0 silk ties.  I removed adipose tissue using electrocautery.  After hemostasis was obtained I then closed the deep layer to lift the vein closer to the skin.  The 2 incisions and the more central  portion of the arm were closed with 1 layer of 4-0 Monocryl.  This area can be accessed in approximately 8 weeks.  The incisions in the distal aspect of the arm are closed with a deep layer of 3-0 Vicryl and the skin closed with 4-0 Monocryl.  Dermabond was applied.  The patient tolerated the procedure well was transferred to recovery room in stable condition.  All needle and sponge counts were correct.  Given the complexity of the case a first assistant was necessary in order to expedient the procedure and safely perform the technical aspects of the operation.  Deitra Mayo, MD, FACS [facility]  DATE OF DICTATION:   08/06/2022

## 2022-08-06 NOTE — Anesthesia Preprocedure Evaluation (Addendum)
Anesthesia Evaluation  Patient identified by MRN, date of birth, ID band Patient awake    Reviewed: Allergy & Precautions, NPO status , Patient's Chart, lab work & pertinent test results  Airway Mallampati: II       Dental   Pulmonary shortness of breath, asthma , sleep apnea , pneumonia, former smoker,    breath sounds clear to auscultation       Cardiovascular hypertension,  Rhythm:Regular     Neuro/Psych  Headaches, PSYCHIATRIC DISORDERS  Neuromuscular disease    GI/Hepatic Neg liver ROS, GERD  ,  Endo/Other  diabetes  Renal/GU Renal disease     Musculoskeletal  (+) Arthritis , Fibromyalgia -  Abdominal   Peds  Hematology   Anesthesia Other Findings   Reproductive/Obstetrics                             Anesthesia Physical Anesthesia Plan  ASA: 3  Anesthesia Plan: General   Post-op Pain Management:    Induction: Intravenous  PONV Risk Score and Plan: 2 and Ondansetron and Dexamethasone  Airway Management Planned: LMA  Additional Equipment:   Intra-op Plan:   Post-operative Plan: Extubation in OR  Informed Consent: I have reviewed the patients History and Physical, chart, labs and discussed the procedure including the risks, benefits and alternatives for the proposed anesthesia with the patient or authorized representative who has indicated his/her understanding and acceptance.     Dental advisory given  Plan Discussed with: CRNA and Anesthesiologist  Anesthesia Plan Comments:        Anesthesia Quick Evaluation

## 2022-08-06 NOTE — Anesthesia Procedure Notes (Signed)
Procedure Name: LMA Insertion Date/Time: 08/06/2022 1:04 PM  Performed by: Genelle Bal, CRNAPre-anesthesia Checklist: Patient identified, Emergency Drugs available, Suction available and Patient being monitored Patient Re-evaluated:Patient Re-evaluated prior to induction Oxygen Delivery Method: Circle system utilized Preoxygenation: Pre-oxygenation with 100% oxygen Induction Type: IV induction Ventilation: Mask ventilation without difficulty LMA: LMA inserted LMA Size: 4.0 Number of attempts: 1 Airway Equipment and Method: Bite block Placement Confirmation: positive ETCO2 Tube secured with: Tape Dental Injury: Teeth and Oropharynx as per pre-operative assessment

## 2022-08-06 NOTE — Anesthesia Postprocedure Evaluation (Addendum)
Anesthesia Post Note  Patient: Oncologist  Procedure(s) Performed: REVISON OF LEFT ARTERIOVENOUS FISTULA WITH REPAIR OF PSEUDOANEURYSMS AND SUPERFISTULIZATIONS (Left: Arm Upper) INSERTION OF RIGHT INTERNAL JUGULAR TUNNELED DIALYSIS CATHETER (Chest)     Patient location during evaluation: PACU Anesthesia Type: General Level of consciousness: awake Pain management: pain level controlled Vital Signs Assessment: post-procedure vital signs reviewed and stable Respiratory status: spontaneous breathing Cardiovascular status: stable Postop Assessment: no apparent nausea or vomiting Anesthetic complications: no   No notable events documented.  Last Vitals:  Vitals:   08/06/22 1630 08/06/22 1645  BP: 126/60 134/65  Pulse: 67 68  Resp: 15 12  Temp:  (!) 36.3 C  SpO2: 95% 93%    Last Pain:  Vitals:   08/06/22 1645  TempSrc:   PainSc: 0-No pain                 Kaj Vasil

## 2022-08-07 ENCOUNTER — Encounter (HOSPITAL_COMMUNITY): Payer: Self-pay | Admitting: Vascular Surgery

## 2022-08-08 ENCOUNTER — Ambulatory Visit: Payer: Medicare HMO | Admitting: Podiatry

## 2022-08-15 ENCOUNTER — Encounter: Payer: Self-pay | Admitting: Podiatry

## 2022-08-15 ENCOUNTER — Ambulatory Visit (INDEPENDENT_AMBULATORY_CARE_PROVIDER_SITE_OTHER): Payer: Medicare HMO | Admitting: Podiatry

## 2022-08-15 DIAGNOSIS — E1169 Type 2 diabetes mellitus with other specified complication: Secondary | ICD-10-CM | POA: Diagnosis not present

## 2022-08-15 DIAGNOSIS — E1142 Type 2 diabetes mellitus with diabetic polyneuropathy: Secondary | ICD-10-CM | POA: Diagnosis not present

## 2022-08-15 DIAGNOSIS — B351 Tinea unguium: Secondary | ICD-10-CM | POA: Diagnosis not present

## 2022-08-15 NOTE — Progress Notes (Signed)
  Subjective:  Patient ID: Oneida Alar, female    DOB: Aug 14, 1976,   MRN: [id number]  Chief Complaint  Patient presents with   Wound Check    Wound check / rfc    46 y.o. female presents for follow-up of right foot wound .Relates doing well and not having any issues. Requesting to have nails trimmed today as well.   She is diabetic and her last A1c was 10.8 . Denies any other pedal complaints. Denies n/v/f/c.   PCP: Renaldo Reel PA  Past Medical History:  Diagnosis Date   ADHD (attention deficit hyperactivity disorder)    Anemia    Arthritis    Asthma    mild = rarely uses inhaler   Diabetes mellitus without complication ([facility])    type 2   Dyspnea    with exertion   Fibromyalgia    GERD (gastroesophageal reflux disease)    no meds   Hypertension    Lung nodule    1.0 cm LLL lung nodule 07/16/22 CT (evaluation by [facility] Dr. Mal Misty)   Osteomyelitis [facility])    RIGHT FOOT FIFTH TOE   Ovarian mass, left 06/26/2021   Renal disorder    ESRD   Sleep apnea    does not use cpap    Objective:  Physical Exam: Vascular: DP/PT pulses 2/4 bilateral. CFT <3 seconds. Normal hair growth on digits. No edema.  Skin. No lacerations or abrasions bilateral feet. Right foot ulceration to distal amputation site healed. Right hallux loose distally upon debridement nail bed exposed but appears healthy. Nails 1-5 bilateral are thickened discolored and elongated.  Musculoskeletal: MMT 5/5 bilateral lower extremities in DF, PF, Inversion and Eversion. Deceased ROM in DF of ankle joint.  Neurological: Sensation intact to light touch.   Assessment:   1. 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 Type 2 diabetes mellitus with diabetic polyneuropathy, unspecified whether long term insulin use ([place])        Plan:  Patient was evaluated and treated and all questions answered. Ulcer right lateral foot -healed -X-rays no  osseous erosions noted around fifth residual metatarsal.  -Discussed and educated patient on diabetic foot care, especially with  regards to the vascular, neurological and musculoskeletal systems.  -Stressed the importance of good glycemic control and the detriment of not  controlling glucose levels in relation to the foot. -Discussed supportive shoes at all times and checking feet regularly.  -Mechanically debrided all nails 1-5 bilateral using sterile nail nipper and filed with dremel without incident  -Answered all patient questions -Patient to return  in 3 months for at risk foot care -Patient advised to call the office if any problems or questions arise in the meantime.    Return in about 3 months (around 11/14/2022) for rfc.   Lorenda Peck, DPM

## 2022-08-23 DIAGNOSIS — R911 Solitary pulmonary nodule: Secondary | ICD-10-CM | POA: Insufficient documentation

## 2022-08-29 ENCOUNTER — Ambulatory Visit (INDEPENDENT_AMBULATORY_CARE_PROVIDER_SITE_OTHER): Payer: Medicare HMO | Admitting: Podiatry

## 2022-08-29 DIAGNOSIS — L601 Onycholysis: Secondary | ICD-10-CM | POA: Diagnosis not present

## 2022-08-29 NOTE — Patient Instructions (Signed)
Soak Instructions    THE DAY AFTER THE PROCEDURE  Place 1/4 cup of epsom salts (or betadine, or white vinegar) in a quart of warm tap water.  Submerge your foot or feet with outer bandage intact for the initial soak; this will allow the bandage to become moist and wet for easy lift off.  Once you remove your bandage, continue to soak in the solution for 20 minutes.  This soak should be done twice a day.  Next, remove your foot or feet from solution, blot dry the affected area and cover.  You may use a band aid large enough to cover the area or use gauze and tape.  Apply other medications to the area as directed by the doctor such as polysporin neosporin.  IF YOUR SKIN BECOMES IRRITATED WHILE USING THESE INSTRUCTIONS, IT IS OKAY TO SWITCH TO  WHITE VINEGAR AND WATER. Or you may use antibacterial soap and water to keep the toe clean  Monitor for any signs/symptoms of infection. Call the office immediately if any occur or go directly to the emergency room. Call with any questions/concerns.    Soak the toe for 2 weeks. Cover with neosporin and bandaid after for 1 week. Can leave open to air after that

## 2022-08-31 NOTE — Progress Notes (Signed)
  Subjective:  Patient ID: Oneida Alar, female    DOB: 10/03/1976,  MRN: [id number]  Chief Complaint  Patient presents with   Nail Problem    Right great toe was lifted up    46 y.o. female presents with the above complaint. History confirmed with patient.  She has uncontrolled diabetes.  They are not sure how this happened.  Objective:  Physical Exam: warm, good capillary refill, no trophic changes or ulcerative lesions, normal DP and PT pulses, and partial avulsion with onycholysis of right hallux nail.  Assessment:   1. Onycholysis      Plan:  Patient was evaluated and treated and all questions answered.  She has had an partial traumatic avulsion of the nail plate.  The remaining nail did not appear to be healthy.  I recommended avulsion of the nail plate.  Following sterile prep Betadine the right hallux nail plate was avulsed.  Post care instructions were given.  There were no signs of infection or ulceration of the nailbed.  There return if there are further issues or signs of infection which I discussed with him  Return if symptoms worsen or fail to improve.

## 2022-09-25 ENCOUNTER — Ambulatory Visit (INDEPENDENT_AMBULATORY_CARE_PROVIDER_SITE_OTHER): Payer: Medicare HMO | Admitting: Physician Assistant

## 2022-09-25 VITALS — BP 178/82 | HR 81 | Temp 98.0°F | Resp 20 | Ht 65.0 in

## 2022-09-25 DIAGNOSIS — N186 End stage renal disease: Secondary | ICD-10-CM

## 2022-09-25 DIAGNOSIS — Z992 Dependence on renal dialysis: Secondary | ICD-10-CM

## 2022-09-25 NOTE — Progress Notes (Signed)
POST OPERATIVE OFFICE NOTE    CC:  F/u for surgery  HPI:  This is a 46 y.o. 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 Scot Dock on 08/06/2022.  She is currently dialyzing via right IJ [facility] on a Monday Wednesday Friday schedule at the [facility] location in [place].  She believes the incisions are well-healed.  She denies any steal symptoms in her left hand.  She is seen today in a motorized wheelchair.  Allergies  Allergen Reactions   Furosemide Swelling   Heparin Other (See Comments)    Patient relates vitreous hemorrhage after heparin. Patient relates vitreous hemorrhage after heparin.   Insulin Aspart Swelling    States she takes insulin it just has mild side effects    Current Outpatient Medications  Medication Sig Dispense Refill   albuterol (VENTOLIN HFA) 108 (90 Base) MCG/ACT inhaler Inhale 1-2 puffs into the lungs every 6 (six) hours as needed for wheezing or shortness of breath.     amLODipine (NORVASC) 10 MG tablet Take 1 tablet (10 mg total) by mouth daily. (Patient taking differently: Take 10 mg by mouth at bedtime.) 30 tablet 0   B Complex-C-Folic Acid (RENA-VITE PO) Take 1 tablet by mouth in the morning.     cinacalcet (SENSIPAR) 30 MG tablet Take 30 mg by mouth every Monday, Wednesday, and Friday. After hemodialysis     dicyclomine (BENTYL) 20 MG tablet Take 20 mg by mouth in the morning and at bedtime.     erythromycin with ethanol (THERAMYCIN) 2 % external solution Apply 1 Application topically in the morning.     ethyl chloride spray Apply 1 Application topically as needed (prior to dialysis).  2   FOSRENOL 1000 MG PACK Take 1,000-2,000 mg by mouth See admin instructions. Take 2 packets (2000 mg) by mouth with each meal & take 1 packet (1000 mg) by mouth with each snack     gabapentin (NEURONTIN) 100 MG capsule Take 100-200 mg by mouth See admin instructions. Take 2  capsules (200 mg) by mouth every morning & take 1 capsule (100 mg) by mouth at bedtime.     glucagon 1 MG injection Inject 1 mg into the muscle once as needed (low blood sugar).     hydrALAZINE (APRESOLINE) 50 MG tablet Take 50 mg by mouth in the morning and at bedtime.     HYDROcodone-acetaminophen (NORCO) 5-325 MG tablet Take 1 tablet by mouth every 6 (six) hours as needed for moderate pain. 20 tablet 0   insulin aspart (NOVOLOG) 100 UNIT/ML FlexPen Inject 6-10 Units into the skin 3 (three) times daily with meals. Sliding scale insulin     insulin glargine (LANTUS) 100 UNIT/ML Solostar Pen Inject 20 Units into the skin at bedtime.     Insulin Pen Needle (BD PEN NEEDLE NANO U/F) 32G X 4 MM MISC Use with insulin 4x/day     ketoconazole (NIZORAL) 2 % shampoo Apply 1 Application topically once a week.     labetalol (NORMODYNE) 200 MG tablet Take 400 mg by mouth in the morning and at bedtime.     loperamide (IMODIUM) 2 MG capsule Take 4 mg by mouth in the morning and at bedtime.     oxyCODONE-acetaminophen (PERCOCET) 5-325 MG tablet Take 1-2 tablets by mouth every 4 (four) hours as needed for severe pain. 20 tablet 0   Respiratory Therapy Supplies (NEBULIZER) DEVI Use as directed  every 4-6 hours as needed     No current facility-administered medications for this visit.     ROS:  See HPI  Physical Exam:  Vitals:   09/25/22 1107  BP: (!) 178/82  Pulse: 81  Resp: 20  Temp: 98 F (36.7 C)  TempSrc: Temporal  SpO2: 98%  Height: 5\' 5"  (1.651 m)    Incision: Incisions of the left arm are well-healed Extremities:   Palpable thrill throughout left arm AV fistula; palpable left radial pulse Neuro: A&O  Assessment/Plan:  This is a 46 y.o. 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  Strongly recommend accessing the fistula in the mid arm away from the surgical site at least for another month -Patient will follow-up on an as-needed basis   Dagoberto Ligas, PA-C [facility] [phone]  Clinic MD:  Scot Dock

## 2022-09-30 ENCOUNTER — Other Ambulatory Visit: Payer: Self-pay

## 2022-09-30 ENCOUNTER — Emergency Department (HOSPITAL_BASED_OUTPATIENT_CLINIC_OR_DEPARTMENT_OTHER)
Admission: EM | Admit: 2022-09-30 | Discharge: 2022-09-30 | Disposition: A | Discharge status: Home / Self Care | Payer: Medicare HMO | Attending: Emergency Medicine | Admitting: Emergency Medicine

## 2022-09-30 ENCOUNTER — Encounter (HOSPITAL_BASED_OUTPATIENT_CLINIC_OR_DEPARTMENT_OTHER): Payer: Self-pay | Admitting: Emergency Medicine

## 2022-09-30 ENCOUNTER — Emergency Department (HOSPITAL_BASED_OUTPATIENT_CLINIC_OR_DEPARTMENT_OTHER): Payer: Medicare HMO

## 2022-09-30 DIAGNOSIS — Z992 Dependence on renal dialysis: Secondary | ICD-10-CM | POA: Insufficient documentation

## 2022-09-30 DIAGNOSIS — M79661 Pain in right lower leg: Secondary | ICD-10-CM | POA: Insufficient documentation

## 2022-09-30 DIAGNOSIS — N186 End stage renal disease: Secondary | ICD-10-CM | POA: Diagnosis not present

## 2022-09-30 DIAGNOSIS — Z794 Long term (current) use of insulin: Secondary | ICD-10-CM | POA: Insufficient documentation

## 2022-09-30 DIAGNOSIS — M79604 Pain in right leg: Secondary | ICD-10-CM

## 2022-09-30 NOTE — ED Triage Notes (Signed)
Pt arrives pov, to triage in personal wheelchair, c/o RT posterior knee pain x 2 days, reports concern for DVT. Reports pain increased during dialysis today. Denies thinners. Also reports RT heel pain

## 2022-09-30 NOTE — ED Notes (Signed)
Pt able to transfers with minimal assistance to motorized scooter. Reviewed discharge instructions and recommendations. Pt states understanding. Family to transport home

## 2022-09-30 NOTE — Discharge Instructions (Addendum)
You were seen in the emergency room for right leg pain and to rule out a DVT (blood clot in your leg).  Your ultrasound was negative for a blood clot.  I recommend following up with your PCP regarding the pain you are experiencing.  Continue going to dialysis as scheduled.  Return to the ER if you develop new or worsening symptoms such as chest pain and shortness of breath.

## 2022-09-30 NOTE — ED Provider Notes (Signed)
[facility] EMERGENCY DEPARTMENT Provider Note   CSN: [id number] Arrival date & time: 09/30/22  1151     History  Chief Complaint  Patient presents with   Leg Pain    Karen [name] is a 46 y.o. female presents to the ED complaining of right leg pain from knee to heel.  Patient describes pain as a sharp, shooting pain that occurs intermittently.  She states it is worse when she puts weight or presses on her heel.  The pain has been worsening over the last couple days.  When she was at dialysis this morning complaining of pain, they recommended she proceed to ED for DVT rule out.  She does not take blood thinners.  She uses a motorized wheelchair due to severe peripheral neuropathy.      Home Medications Prior to Admission medications   Medication Sig Start Date End Date Taking? Authorizing Provider  albuterol (VENTOLIN HFA) 108 (90 Base) MCG/ACT inhaler Inhale 1-2 puffs into the lungs every 6 (six) hours as needed for wheezing or shortness of breath.    [provider]  amLODipine (NORVASC) 10 MG tablet Take 1 tablet (10 mg total) by mouth daily. Patient taking differently: Take 10 mg by mouth at bedtime. 06/12/18   Bonnielee Haff, MD  B Complex-C-Folic Acid (RENA-VITE PO) Take 1 tablet by mouth in the morning.    [provider]  cinacalcet (SENSIPAR) 30 MG tablet Take 30 mg by mouth every Monday, Wednesday, and Friday. After hemodialysis    [provider]  dicyclomine (BENTYL) 20 MG tablet Take 20 mg by mouth in the morning and at bedtime. 05/19/20   [provider]  erythromycin with ethanol (THERAMYCIN) 2 % external solution Apply 1 Application topically in the morning. 04/12/21   [provider]  ethyl chloride spray Apply 1 Application topically as needed (prior to dialysis). 09/29/18   [provider]  FOSRENOL 1000 MG PACK Take 1,000-2,000 mg by mouth See admin instructions. Take 2 packets (2000 mg) by mouth  with each meal & take 1 packet (1000 mg) by mouth with each snack 01/12/19   [provider]  gabapentin (NEURONTIN) 100 MG capsule Take 100-200 mg by mouth See admin instructions. Take 2 capsules (200 mg) by mouth every morning & take 1 capsule (100 mg) by mouth at bedtime. 03/12/21   [provider]  glucagon 1 MG injection Inject 1 mg into the muscle once as needed (low blood sugar). 06/16/18   [provider]  hydrALAZINE (APRESOLINE) 50 MG tablet Take 50 mg by mouth in the morning and at bedtime. 01/08/22   [provider]  HYDROcodone-acetaminophen (NORCO) 5-325 MG tablet Take 1 tablet by mouth every 6 (six) hours as needed for moderate pain. 08/06/22   Dagoberto Ligas, PA-C  insulin aspart (NOVOLOG) 100 UNIT/ML FlexPen Inject 6-10 Units into the skin 3 (three) times daily with meals. Sliding scale insulin    [provider]  insulin glargine (LANTUS) 100 UNIT/ML Solostar Pen Inject 20 Units into the skin at bedtime.    [provider]  Insulin Pen Needle (BD PEN NEEDLE NANO U/F) 32G X 4 MM MISC Use with insulin 4x/day 12/09/19   [provider]  ketoconazole (NIZORAL) 2 % shampoo Apply 1 Application topically once a week. 09/03/21   [provider]  labetalol (NORMODYNE) 200 MG tablet Take 400 mg by mouth in the morning and at bedtime.    [provider]  loperamide (IMODIUM) 2  MG capsule Take 4 mg by mouth in the morning and at bedtime. 10/15/18   [provider]  oxyCODONE-acetaminophen (PERCOCET) 5-325 MG tablet Take 1-2 tablets by mouth every 4 (four) hours as needed for severe pain. 08/06/22 08/06/23  Angelia Mould, MD  Respiratory Therapy Supplies (NEBULIZER) DEVI Use as directed every 4-6 hours as needed 01/19/19   [provider]      Allergies    Furosemide, Heparin, and Insulin aspart    Review of Systems   Review of Systems  Physical Exam Updated Vital Signs BP (!) 161/76 (BP  Location: Right Arm)   Pulse 83   Temp 98.7 F (37.1 C) (Oral)   Resp 20   Ht 5\' 5"  (1.651 m)   Wt 86.5 kg   SpO2 97%   BMI 31.73 kg/m  Physical Exam  ED Results / Procedures / Treatments   Labs (all labs ordered are listed, but only abnormal results are displayed) Labs Reviewed - No data to display  EKG None  Radiology US Venous Img Lower Unilateral Right  Result Date: 09/30/2022 CLINICAL DATA:  Posterior leg pain. EXAM: Right LOWER EXTREMITY VENOUS DOPPLER ULTRASOUND TECHNIQUE: Gray-scale sonography with compression, as well as color and duplex ultrasound, were performed to evaluate the deep venous system(s) from the level of the common femoral vein through the popliteal and proximal calf veins. COMPARISON:  None Available. FINDINGS: VENOUS Normal compressibility of the common femoral, superficial femoral, and popliteal veins, as well as the visualized calf veins. Visualized portions of profunda femoral vein and great saphenous vein unremarkable. Assessment of the right femoral vein, popliteal vein, and calf veins is limited on grayscale imaging. No filling defects are seen on color Doppler evaluation in the right femoral vein, posterior tibial vein, and the popliteal vein to suggest DVT. The peroneal vein was poorly visualized and incompletely assessed for the presence of thrombus. Doppler waveforms show normal direction of venous flow, normal respiratory plasticity and response to augmentation. Limited views of the contralateral common femoral vein are unremarkable. OTHER None. Limitations: As above IMPRESSION: 1. No evidence of right lower extremity DVT. 2. The peroneal vein was poorly visualized and incompletely assessed for the presence of thrombus. Electronically Signed   By: Marin Roberts M.D.   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    The differential diagnosis includes DVT, superficial vein thrombosis, neuropathy, sciatica  The initial plan is to obtain ultrasound of right leg for DVT rule out  Additional history obtained: Records reviewed Primary Care Documents  Initial Assessment:   Upon initial assessment, patient is dressing herself in a gown.  Patient is able to stand unassisted, experiences shooting pain from right heel to right knee.  She is able to move her lower extremities at her baseline.  No erythema or swelling appreciated on exam to right calf or foot.  Strong dorsalis pedis pulse palpated.  No pain with active or passive ranges of motion of right lower extremity.  LCTAB, no respiratory distress.  Heart rate and rhythm normal, heart sounds normal.  Independent visualization and interpretation of imaging: I independently visualized the following imaging with scope of interpretation limited to determining acute life threatening conditions related to emergency care: Venous US of right lower extremity, which revealed no evidence of DVT.  Treatment and Reassessment: I do not feel that any other imaging or work-up is warranted at this time.  She is negative for DVT.  Unable to treat patient's neuropathy or pain with medications due to ESRD requiring dialysis.  Patient is limited to 300 mg gabapentin per day.    Disposition:   Based on patient presentation and negative ultrasound for DVT, I believe patient is stable and appropriate for discharge home.  Will advise patient to continue attending  dialysis as scheduled and to follow-up with PCP regarding right leg pain.  I suspect patient's pain is neuropathic in nature based on description of shooting sharp pain that radiates from heel to knee.  Return precautions given.        Final Clinical Impression(s) / ED Diagnoses Final diagnoses:  Right leg pain    Rx / DC Orders ED Discharge Orders     None         Pat Kocher, [place] 09/30/22 1532    Margette Fast, MD 10/01/22 1506

## 2022-11-14 ENCOUNTER — Ambulatory Visit: Payer: Medicare HMO | Admitting: Podiatrist

## 2022-12-05 ENCOUNTER — Encounter: Payer: Self-pay | Admitting: Podiatry

## 2022-12-05 ENCOUNTER — Ambulatory Visit (INDEPENDENT_AMBULATORY_CARE_PROVIDER_SITE_OTHER): Payer: Medicare HMO | Admitting: Podiatry

## 2022-12-05 DIAGNOSIS — B351 Tinea unguium: Secondary | ICD-10-CM

## 2022-12-05 DIAGNOSIS — E1142 Type 2 diabetes mellitus with diabetic polyneuropathy: Secondary | ICD-10-CM

## 2022-12-05 DIAGNOSIS — E1169 Type 2 diabetes mellitus with other specified complication: Secondary | ICD-10-CM | POA: Diagnosis not present

## 2022-12-05 DIAGNOSIS — L84 Corns and callosities: Secondary | ICD-10-CM | POA: Diagnosis not present

## 2022-12-05 NOTE — Progress Notes (Signed)
  Subjective:  Patient ID: Karen [name], female    DOB: [birth year],   MRN: [id number]  Chief Complaint  Patient presents with   Nail Problem     Routine foot care    47 y.o. female presents for concern of thickened elongated and painful nails that are difficult to trim. Requesting to have them trimmed today. Relates she has been doing well and no concern for ulceration today. Did see Dr. Sherryle Lis recently for an injured toe that has healed.  Relates burning and tingling in their feet. Patient is diabetic and last A1c was 10.8 on 06/05/22   PCP:  Wynn Banker, PA     Past Medical History:  Diagnosis Date   ADHD (attention deficit hyperactivity disorder)    Anemia    Arthritis    Asthma    mild = rarely uses inhaler   Diabetes mellitus without complication (HCC)    type 2   Dyspnea    with exertion   Fibromyalgia    GERD (gastroesophageal reflux disease)    no meds   Hypertension    Lung nodule    1.0 cm LLL lung nodule 07/16/22 CT (evaluation by [facility] Dr. Mal Misty)   Osteomyelitis [facility])    RIGHT FOOT FIFTH TOE   Ovarian mass, left 06/26/2021   Renal disorder    ESRD   Sleep apnea    does not use cpap    Objective:  Physical Exam: Vascular: DP/PT pulses 2/4 bilateral. CFT <3 seconds. Absent hair growth on digits. Edema noted to bilateral lower extremities. Xerosis noted bilaterally.  Skin. No lacerations or abrasions bilateral feet. Nails 1-5 on left and 1-3 on right  are thickened discolored and elongated with subungual debris. Proximal fifth metatrasal area with pre-ulcerative callus with some hemorrhagic tissue present.  Musculoskeletal: MMT 5/5 bilateral lower extremities in DF, PF, Inversion and Eversion. Deceased ROM in DF of ankle joint.  Neurological: Sensation intact to light touch. Protective sensation diminished bilateral.    Assessment:   1. 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 Type 2 diabetes mellitus with diabetic polyneuropathy, unspecified whether long term insulin use ([place])   3. Pre-ulcerative calluses      Plan:  Patient was evaluated and treated and all questions answered. -Discussed and educated patient on diabetic foot care, especially with  regards to the vascular, neurological and musculoskeletal systems.  -Stressed the importance of good glycemic control and the detriment of not  controlling glucose levels in relation to the foot. -Discussed supportive shoes at all times and checking feet regularly.  -Mechanically debrided all nails 1-5 bilateral using sterile nail nipper and filed with dremel without incident  -Answered all patient questions -Hyperkeratotic tissue debrided without incident with #15 blade.  -Patient to return  in 3 months for at risk foot care -Patient advised to call the office if any problems or questions arise in the meantime.   Lorenda Peck, DPM

## 2023-03-06 ENCOUNTER — Ambulatory Visit (INDEPENDENT_AMBULATORY_CARE_PROVIDER_SITE_OTHER): Payer: Medicare HMO | Admitting: Podiatry

## 2023-03-06 ENCOUNTER — Encounter: Payer: Self-pay | Admitting: Podiatry

## 2023-03-06 DIAGNOSIS — B351 Tinea unguium: Secondary | ICD-10-CM | POA: Diagnosis not present

## 2023-03-06 DIAGNOSIS — E1142 Type 2 diabetes mellitus with diabetic polyneuropathy: Secondary | ICD-10-CM | POA: Diagnosis not present

## 2023-03-06 NOTE — Progress Notes (Signed)
  Subjective:  Patient ID: Karen [name], female    DOB: [birth year],   MRN: [id number]  Chief Complaint  Patient presents with   Nail Problem     Routine foot care    47 y.o. female presents for concern of thickened elongated and painful nails that are difficult to trim. Requesting to have them trimmed today. Relates she has been doing well and no concern for ulceration today. Did see Dr. Sherryle Lis recently for an injured toe that has healed.  Relates burning and tingling in their feet. Patient is diabetic and last A1c was 10.8 on 06/05/22   PCP:  Wynn Banker, PA     Past Medical History:  Diagnosis Date   ADHD (attention deficit hyperactivity disorder)    Anemia    Arthritis    Asthma    mild = rarely uses inhaler   Diabetes mellitus without complication (HCC)    type 2   Dyspnea    with exertion   Fibromyalgia    GERD (gastroesophageal reflux disease)    no meds   Hypertension    Lung nodule    1.0 cm LLL lung nodule 07/16/22 CT (evaluation by [facility] Dr. Mal Misty)   Osteomyelitis [facility])    RIGHT FOOT FIFTH TOE   Ovarian mass, left 06/26/2021   Renal disorder    ESRD   Sleep apnea    does not use cpap    Objective:  Physical Exam: Vascular: DP/PT pulses 2/4 bilateral. CFT <3 seconds. Absent hair growth on digits. Edema noted to bilateral lower extremities. Xerosis noted bilaterally.  Skin. No lacerations or abrasions bilateral feet. Nails 1-5 on left and 1-3 on right  are thickened discolored and elongated with subungual debris.   Musculoskeletal: MMT 5/5 bilateral lower extremities in DF, PF, Inversion and Eversion. Deceased ROM in DF of ankle joint.  Neurological: Sensation intact to light touch. Protective sensation diminished bilateral.    Assessment:   No diagnosis found.    Plan:  Patient was evaluated and treated and all questions answered. -Discussed and educated patient on diabetic foot care, especially with  regards to the vascular,  neurological and musculoskeletal systems.  -Stressed the importance of good glycemic control and the detriment of not  controlling glucose levels in relation to the foot. -Discussed supportive shoes at all times and checking feet regularly.  -Mechanically debrided all nails 1-5 bilateral using sterile nail nipper and filed with dremel without incident  -Answered all patient questions -Patient to return  in 3 months for at risk foot care -Patient advised to call the office if any problems or questions arise in the meantime.   Lorenda Peck, DPM

## 2023-06-12 ENCOUNTER — Encounter: Payer: Self-pay | Admitting: Podiatry

## 2023-06-12 ENCOUNTER — Ambulatory Visit (INDEPENDENT_AMBULATORY_CARE_PROVIDER_SITE_OTHER): Payer: Medicare HMO | Admitting: Podiatry

## 2023-06-12 DIAGNOSIS — B351 Tinea unguium: Secondary | ICD-10-CM

## 2023-06-12 DIAGNOSIS — E119 Type 2 diabetes mellitus without complications: Secondary | ICD-10-CM | POA: Diagnosis not present

## 2023-06-12 DIAGNOSIS — E1169 Type 2 diabetes mellitus with other specified complication: Secondary | ICD-10-CM | POA: Diagnosis not present

## 2023-06-12 DIAGNOSIS — L84 Corns and callosities: Secondary | ICD-10-CM

## 2023-06-12 NOTE — Progress Notes (Signed)
  Subjective:  Patient ID: Karen [name], female    DOB: 07-23-1976,   MRN: [id number]  No chief complaint on file.   48 y.o. female presents for concern of thickened elongated and painful nails that are difficult to trim. Requesting to have them trimmed today. Relates she has been doing well and no concern for ulceration today.   Relates burning and tingling in their feet. Patient is diabetic and last A1c was 7.0 on 04/15/23  PCP:  Loura Pardon, PA     Past Medical History:  Diagnosis Date   ADHD (attention deficit hyperactivity disorder)    Anemia    Arthritis    Asthma    mild = rarely uses inhaler   Diabetes mellitus without complication (HCC)    type 2   Dyspnea    with exertion   Fibromyalgia    GERD (gastroesophageal reflux disease)    no meds   Hypertension    Lung nodule    1.0 cm LLL lung nodule 07/16/22 CT (evaluation by [facility] Dr. Rosebud Poles)   Osteomyelitis [facility])    RIGHT FOOT FIFTH TOE   Ovarian mass, left 06/26/2021   Renal disorder    ESRD   Sleep apnea    does not use cpap    Objective:  Physical Exam: Vascular: DP/PT pulses 2/4 bilateral. CFT <3 seconds. Absent hair growth on digits. Edema noted to bilateral lower extremities. Xerosis noted bilaterally.  Skin. No lacerations or abrasions bilateral feet. Nails 1-5 on left and 1-3 on right  are thickened discolored and elongated with subungual debris.   Musculoskeletal: MMT 5/5 bilateral lower extremities in DF, PF, Inversion and Eversion. Deceased ROM in DF of ankle joint.  Neurological: Sensation intact to light touch. Protective sensation diminished bilateral.    Assessment:   1. 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 Pre-ulcerative calluses   3. Encounter for diabetic foot exam (HCC)       Plan:  Patient was evaluated and treated and all questions answered. -Discussed and educated patient on diabetic foot care,  especially with  regards to the vascular, neurological and musculoskeletal systems.  -Stressed the importance of good glycemic control and the detriment of not  controlling glucose levels in relation to the foot. -Discussed supportive shoes at all times and checking feet regularly.  -Mechanically debrided all nails 1-5 bilateral using sterile nail nipper and filed with dremel without incident  -Answered all patient questions -Patient to return  in 3 months for at risk foot care -Patient advised to call the office if any problems or questions arise in the meantime.   Louann Sjogren, DPM

## 2023-09-11 ENCOUNTER — Ambulatory Visit: Payer: Medicare HMO | Admitting: Podiatry

## 2023-09-11 ENCOUNTER — Encounter: Payer: Self-pay | Admitting: Podiatry

## 2023-09-11 DIAGNOSIS — E1169 Type 2 diabetes mellitus with other specified complication: Secondary | ICD-10-CM

## 2023-09-11 DIAGNOSIS — B351 Tinea unguium: Secondary | ICD-10-CM | POA: Diagnosis not present

## 2023-09-11 DIAGNOSIS — L84 Corns and callosities: Secondary | ICD-10-CM | POA: Diagnosis not present

## 2023-09-11 DIAGNOSIS — E1142 Type 2 diabetes mellitus with diabetic polyneuropathy: Secondary | ICD-10-CM | POA: Diagnosis not present

## 2023-09-11 NOTE — Progress Notes (Signed)
Subjective:  Patient ID: Karen [name], female    DOB: 12-Jan-1976,   MRN: [id number]  No chief complaint on file.   47 y.o. female presents for concern of thickened elongated and painful nails that are difficult to trim. Requesting to have them trimmed today. Relates she has been doing well and no concern for ulceration today.   Relates burning and tingling in their feet. Patient is diabetic and last A1c was 7.0 on 04/15/23  PCP:  Loura Pardon, PA     Past Medical History:  Diagnosis Date   ADHD (attention deficit hyperactivity disorder)    Anemia    Arthritis    Asthma    mild = rarely uses inhaler   Diabetes mellitus without complication (HCC)    type 2   Dyspnea    with exertion   Fibromyalgia    GERD (gastroesophageal reflux disease)    no meds   Hypertension    Lung nodule    1.0 cm LLL lung nodule 07/16/22 CT (evaluation by [facility] Dr. 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 CFT <3 seconds. Absent hair growth on digits. Edema noted to bilateral lower extremities. Xerosis noted bilaterally.  Skin. No lacerations or abrasions bilateral feet. Nails 1-5 on left and 1-3 on right  are thickened discolored and elongated with subungual debris.  Hyperkeratosis noted to latearl foot on right Musculoskeletal: MMT 5/5 bilateral lower extremities in DF, PF, Inversion and Eversion. Deceased ROM in DF of ankle joint. 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  Neurological: Sensation intact to light touch. Protective sensation diminished bilateral.    Assessment:   1. 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 Type 2 diabetes mellitus with diabetic polyneuropathy, unspecified whether long term insulin use (HCC)   3. Pre-ulcerative calluses        Plan:  Patient was evaluated and treated and all questions answered. -Discussed and educated patient on diabetic foot care, especially with  regards to the vascular, neurological and musculoskeletal systems.  -Stressed the importance of good glycemic control and the detriment of not  controlling glucose levels in relation to the foot. -Discussed supportive shoes at all times and checking feet regularly.  -Mechanically debrided all nails 1-5 bilateral using sterile nail nipper and filed with dremel without incident -Hyperkeratotic tissue debrided to right lateral foot without incident with chisel.   -Answered all patient questions -Patient to return  in 3 months for at risk foot care -Patient advised to call the office if any problems or questions arise in the meantime.   Louann Sjogren, DPM

## 2023-12-11 ENCOUNTER — Encounter: Payer: Self-pay | Admitting: Podiatry

## 2023-12-11 ENCOUNTER — Ambulatory Visit (INDEPENDENT_AMBULATORY_CARE_PROVIDER_SITE_OTHER): Payer: Medicare HMO | Admitting: Podiatry

## 2023-12-11 DIAGNOSIS — E1142 Type 2 diabetes mellitus with diabetic polyneuropathy: Secondary | ICD-10-CM | POA: Diagnosis not present

## 2023-12-11 DIAGNOSIS — B351 Tinea unguium: Secondary | ICD-10-CM

## 2023-12-11 DIAGNOSIS — L84 Corns and callosities: Secondary | ICD-10-CM | POA: Diagnosis not present

## 2023-12-11 DIAGNOSIS — E1169 Type 2 diabetes mellitus with other specified complication: Secondary | ICD-10-CM | POA: Diagnosis not present

## 2023-12-11 MED ORDER — AMMONIUM LACTATE 12 % EX CREA: 1.0000 | TOPICAL_CREAM | CUTANEOUS | 0 refills | Status: DC | PRN

## 2023-12-11 NOTE — Progress Notes (Signed)
  Subjective:  Patient ID: Karen [name], female    DOB: Dec 27, 1975,   MRN: [id number]  No chief complaint on file.   48 y.o. female presents for concern of thickened elongated and painful nails that are difficult to trim. Requesting to have them trimmed today. Relates she has been doing well and no concern for ulceration today.   Relates burning and tingling in their feet. Patient is diabetic and last A1c was 7.0 on 04/15/23  PCP:  Lesa Jon HERO, PA     Past Medical History:  Diagnosis Date   ADHD (attention deficit hyperactivity disorder)    Anemia    Arthritis    Asthma    mild = rarely uses inhaler   Diabetes mellitus without complication (HCC)    type 2   Dyspnea    with exertion   Fibromyalgia    GERD (gastroesophageal reflux disease)    no meds   Hypertension    Lung nodule    1.0 cm LLL lung nodule 07/16/22 CT (evaluation by [facility] Dr. 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 CFT <3 seconds. Absent hair growth on digits. Edema noted to bilateral lower extremities. Xerosis noted bilaterally.  Skin. No lacerations or abrasions bilateral feet. Nails 1-5 on left and 1-3 on right  are thickened discolored and elongated with subungual debris.  Hyperkeratosis noted to latearl foot on right Musculoskeletal: MMT 5/5 bilateral lower extremities in DF, PF, Inversion and Eversion. Deceased ROM in DF of ankle joint. 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  Neurological: Sensation intact to light touch. Protective sensation diminished bilateral.    Assessment:   1. 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 -Discussed and educated patient on diabetic foot care,  especially with  regards to the vascular, neurological and musculoskeletal systems.  -Stressed the importance of good glycemic control and the detriment of not  controlling glucose levels in relation to the foot. -Discussed supportive shoes at all times and checking feet regularly.  -Mechanically debrided all nails 1-5 bilateral using sterile nail nipper and filed with dremel without incident -Hyperkeratotic tissue debrided to right lateral foot without incident with chisel.   -Answered all patient questions -Patient to return  in 3 months for at risk foot care -Patient advised to call the office if any problems or questions arise in the meantime.   Asberry Failing, DPM

## 2024-01-26 ENCOUNTER — Emergency Department (HOSPITAL_COMMUNITY): Payer: Medicare HMO

## 2024-01-26 ENCOUNTER — Observation Stay (HOSPITAL_BASED_OUTPATIENT_CLINIC_OR_DEPARTMENT_OTHER): Payer: Medicare HMO

## 2024-01-26 ENCOUNTER — Other Ambulatory Visit: Payer: Self-pay

## 2024-01-26 ENCOUNTER — Encounter (HOSPITAL_COMMUNITY): Payer: Self-pay | Admitting: Emergency Medicine

## 2024-01-26 ENCOUNTER — Inpatient Hospital Stay (HOSPITAL_COMMUNITY)
Admission: EM | Admit: 2024-01-26 | Discharge: 2024-01-28 | DRG: 321 | Disposition: A | Discharge status: Home / Self Care | Payer: Medicare HMO | Attending: Internal Medicine | Admitting: Internal Medicine

## 2024-01-26 DIAGNOSIS — I2510 Atherosclerotic heart disease of native coronary artery without angina pectoris: Secondary | ICD-10-CM | POA: Diagnosis present

## 2024-01-26 DIAGNOSIS — I10 Essential (primary) hypertension: Secondary | ICD-10-CM | POA: Diagnosis present

## 2024-01-26 DIAGNOSIS — R7989 Other specified abnormal findings of blood chemistry: Secondary | ICD-10-CM

## 2024-01-26 DIAGNOSIS — G8929 Other chronic pain: Secondary | ICD-10-CM | POA: Diagnosis present

## 2024-01-26 DIAGNOSIS — Z1152 Encounter for screening for COVID-19: Secondary | ICD-10-CM

## 2024-01-26 DIAGNOSIS — I5022 Chronic systolic (congestive) heart failure: Secondary | ICD-10-CM | POA: Diagnosis present

## 2024-01-26 DIAGNOSIS — Z8679 Personal history of other diseases of the circulatory system: Secondary | ICD-10-CM

## 2024-01-26 DIAGNOSIS — R079 Chest pain, unspecified: Secondary | ICD-10-CM

## 2024-01-26 DIAGNOSIS — Z7982 Long term (current) use of aspirin: Secondary | ICD-10-CM

## 2024-01-26 DIAGNOSIS — M797 Fibromyalgia: Secondary | ICD-10-CM | POA: Diagnosis present

## 2024-01-26 DIAGNOSIS — Z90710 Acquired absence of both cervix and uterus: Secondary | ICD-10-CM

## 2024-01-26 DIAGNOSIS — N186 End stage renal disease: Secondary | ICD-10-CM | POA: Diagnosis not present

## 2024-01-26 DIAGNOSIS — Z833 Family history of diabetes mellitus: Secondary | ICD-10-CM

## 2024-01-26 DIAGNOSIS — I3139 Other pericardial effusion (noninflammatory): Secondary | ICD-10-CM | POA: Diagnosis present

## 2024-01-26 DIAGNOSIS — R072 Precordial pain: Secondary | ICD-10-CM | POA: Diagnosis not present

## 2024-01-26 DIAGNOSIS — H5462 Unqualified visual loss, left eye, normal vision right eye: Secondary | ICD-10-CM | POA: Diagnosis present

## 2024-01-26 DIAGNOSIS — R0789 Other chest pain: Secondary | ICD-10-CM | POA: Diagnosis not present

## 2024-01-26 DIAGNOSIS — E11319 Type 2 diabetes mellitus with unspecified diabetic retinopathy without macular edema: Secondary | ICD-10-CM | POA: Diagnosis present

## 2024-01-26 DIAGNOSIS — Z794 Long term (current) use of insulin: Secondary | ICD-10-CM

## 2024-01-26 DIAGNOSIS — M545 Low back pain, unspecified: Principal | ICD-10-CM

## 2024-01-26 DIAGNOSIS — Z992 Dependence on renal dialysis: Secondary | ICD-10-CM

## 2024-01-26 DIAGNOSIS — Z8051 Family history of malignant neoplasm of kidney: Secondary | ICD-10-CM

## 2024-01-26 DIAGNOSIS — Z800 Family history of malignant neoplasm of digestive organs: Secondary | ICD-10-CM

## 2024-01-26 DIAGNOSIS — I214 Non-ST elevation (NSTEMI) myocardial infarction: Principal | ICD-10-CM

## 2024-01-26 DIAGNOSIS — Z825 Family history of asthma and other chronic lower respiratory diseases: Secondary | ICD-10-CM

## 2024-01-26 DIAGNOSIS — Z955 Presence of coronary angioplasty implant and graft: Secondary | ICD-10-CM

## 2024-01-26 DIAGNOSIS — E1122 Type 2 diabetes mellitus with diabetic chronic kidney disease: Secondary | ICD-10-CM | POA: Diagnosis present

## 2024-01-26 DIAGNOSIS — Z9641 Presence of insulin pump (external) (internal): Secondary | ICD-10-CM | POA: Diagnosis present

## 2024-01-26 DIAGNOSIS — Z87891 Personal history of nicotine dependence: Secondary | ICD-10-CM

## 2024-01-26 DIAGNOSIS — J4520 Mild intermittent asthma, uncomplicated: Secondary | ICD-10-CM | POA: Diagnosis present

## 2024-01-26 DIAGNOSIS — E875 Hyperkalemia: Secondary | ICD-10-CM | POA: Diagnosis present

## 2024-01-26 DIAGNOSIS — Z635 Disruption of family by separation and divorce: Secondary | ICD-10-CM

## 2024-01-26 DIAGNOSIS — A0839 Other viral enteritis: Secondary | ICD-10-CM | POA: Diagnosis present

## 2024-01-26 DIAGNOSIS — I132 Hypertensive heart and chronic kidney disease with heart failure and with stage 5 chronic kidney disease, or end stage renal disease: Secondary | ICD-10-CM | POA: Diagnosis present

## 2024-01-26 DIAGNOSIS — E785 Hyperlipidemia, unspecified: Secondary | ICD-10-CM | POA: Diagnosis present

## 2024-01-26 DIAGNOSIS — K219 Gastro-esophageal reflux disease without esophagitis: Secondary | ICD-10-CM | POA: Diagnosis present

## 2024-01-26 DIAGNOSIS — Z993 Dependence on wheelchair: Secondary | ICD-10-CM

## 2024-01-26 DIAGNOSIS — Z79899 Other long term (current) drug therapy: Secondary | ICD-10-CM

## 2024-01-26 DIAGNOSIS — N2581 Secondary hyperparathyroidism of renal origin: Secondary | ICD-10-CM | POA: Diagnosis present

## 2024-01-26 DIAGNOSIS — Z803 Family history of malignant neoplasm of breast: Secondary | ICD-10-CM

## 2024-01-26 DIAGNOSIS — Z8249 Family history of ischemic heart disease and other diseases of the circulatory system: Secondary | ICD-10-CM

## 2024-01-26 HISTORY — DX: Blindness, one eye, unspecified eye: H54.40

## 2024-01-26 LAB — ECHOCARDIOGRAM COMPLETE
AR max vel: 2.02 cm2
AV Area VTI: 2.08 cm2
AV Area mean vel: 2.03 cm2
AV Mean grad: 5.5 mmHg
AV Peak grad: 10.6 mmHg
Ao pk vel: 1.63 m/s
Area-P 1/2: 4.21 cm2
Height: 65 in
S' Lateral: 3.75 cm
Weight: 3040 [oz_av]

## 2024-01-26 LAB — CBC
HCT: 32.6 % — ABNORMAL LOW (ref 36.0–46.0)
Hemoglobin: 10.4 g/dL — ABNORMAL LOW (ref 12.0–15.0)
MCH: 30.9 pg (ref 26.0–34.0)
MCHC: 31.9 g/dL (ref 30.0–36.0)
MCV: 96.7 fL (ref 80.0–100.0)
Platelets: 219 10*3/uL (ref 150–400)
RBC: 3.37 MIL/uL — ABNORMAL LOW (ref 3.87–5.11)
RDW: 13.2 % (ref 11.5–15.5)
WBC: 8.7 10*3/uL (ref 4.0–10.5)
nRBC: 0 % (ref 0.0–0.2)

## 2024-01-26 LAB — BASIC METABOLIC PANEL
Anion gap: 15 (ref 5–15)
BUN: 57 mg/dL — ABNORMAL HIGH (ref 6–20)
CO2: 21 mmol/L — ABNORMAL LOW (ref 22–32)
Calcium: 9.2 mg/dL (ref 8.9–10.3)
Chloride: 103 mmol/L (ref 98–111)
Creatinine, Ser: 8.35 mg/dL — ABNORMAL HIGH (ref 0.44–1.00)
GFR, Estimated: 5 mL/min — ABNORMAL LOW (ref >60)
Glucose, Bld: 128 mg/dL — ABNORMAL HIGH (ref 70–99)
Potassium: 5.2 mmol/L — ABNORMAL HIGH (ref 3.5–5.1)
Sodium: 139 mmol/L (ref 135–145)

## 2024-01-26 LAB — D-DIMER, QUANTITATIVE: D-Dimer, Quant: 0.72 ug{FEU}/mL — ABNORMAL HIGH (ref 0.00–0.50)

## 2024-01-26 LAB — TROPONIN I (HIGH SENSITIVITY)
Troponin I (High Sensitivity): 372 ng/L (ref <18)
Troponin I (High Sensitivity): 612 ng/L (ref <18)
Troponin I (High Sensitivity): 1080 ng/L (ref <18)
Troponin I (High Sensitivity): 1668 ng/L (ref <18)
Troponin I (High Sensitivity): 2148 ng/L (ref <18)
Troponin I (High Sensitivity): 2942 ng/L (ref <18)

## 2024-01-26 LAB — RESP PANEL BY RT-PCR (RSV, FLU A&B, COVID)  RVPGX2
Influenza A by PCR: NEGATIVE
Influenza B by PCR: NEGATIVE
Resp Syncytial Virus by PCR: NEGATIVE
SARS Coronavirus 2 by RT PCR: NEGATIVE

## 2024-01-26 LAB — GROUP A STREP BY PCR: Group A Strep by PCR: NOT DETECTED

## 2024-01-26 LAB — HIV ANTIBODY (ROUTINE TESTING W REFLEX): HIV Screen 4th Generation wRfx: NONREACTIVE

## 2024-01-26 LAB — HEPATITIS B SURFACE ANTIGEN: Hepatitis B Surface Ag: NONREACTIVE

## 2024-01-26 MED ORDER — ASPIRIN 325 MG PO TABS
325.0000 mg | ORAL_TABLET | Freq: Every day | ORAL | Status: DC
  Administered 2024-01-26: 325 mg via ORAL
  Filled 2024-01-26: qty 1

## 2024-01-26 MED ORDER — MORPHINE SULFATE (PF) 4 MG/ML IV SOLN
4.0000 mg | Freq: Once | INTRAVENOUS | Status: AC
  Administered 2024-01-26: 4 mg via INTRAVENOUS
  Filled 2024-01-26: qty 1

## 2024-01-26 MED ORDER — HEPARIN (PORCINE) 25000 UT/250ML-% IV SOLN
1250.0000 [IU]/h | INTRAVENOUS | Status: DC
  Administered 2024-01-26: 1000 [IU]/h via INTRAVENOUS
  Filled 2024-01-26: qty 250

## 2024-01-26 MED ORDER — GABAPENTIN 100 MG PO CAPS: 100.0000 mg | ORAL_CAPSULE | ORAL | Status: DC

## 2024-01-26 MED ORDER — LANTHANUM CARBONATE 500 MG PO CHEW
2000.0000 mg | CHEWABLE_TABLET | Freq: Three times a day (TID) | ORAL | Status: DC
Start: 2024-01-26 — End: 2024-01-28
  Administered 2024-01-26: 2000 mg via ORAL
  Filled 2024-01-26 (×9): qty 4

## 2024-01-26 MED ORDER — CHLORHEXIDINE GLUCONATE CLOTH 2 % EX PADS: 6.0000 | MEDICATED_PAD | Freq: Every day | CUTANEOUS | Status: DC

## 2024-01-26 MED ORDER — LIDOCAINE 5 % EX PTCH
1.0000 | MEDICATED_PATCH | CUTANEOUS | Status: DC
  Administered 2024-01-26: 1 via TRANSDERMAL
  Filled 2024-01-26: qty 1

## 2024-01-26 MED ORDER — SODIUM ZIRCONIUM CYCLOSILICATE 10 G PO PACK
10.0000 g | PACK | Freq: Once | ORAL | Status: AC
  Administered 2024-01-26: 10 g via ORAL
  Filled 2024-01-26: qty 1

## 2024-01-26 MED ORDER — GABAPENTIN 100 MG PO CAPS
200.0000 mg | ORAL_CAPSULE | Freq: Every day | ORAL | Status: DC
  Administered 2024-01-26 – 2024-01-28 (×2): 200 mg via ORAL
  Filled 2024-01-26 (×2): qty 2

## 2024-01-26 MED ORDER — IOHEXOL 350 MG/ML SOLN
75.0000 mL | Freq: Once | INTRAVENOUS | Status: AC | PRN
  Administered 2024-01-26: 75 mL via INTRAVENOUS

## 2024-01-26 MED ORDER — ALBUTEROL SULFATE (2.5 MG/3ML) 0.083% IN NEBU
2.5000 mg | INHALATION_SOLUTION | Freq: Four times a day (QID) | RESPIRATORY_TRACT | Status: DC | PRN
Start: 2024-01-26 — End: 2024-01-28

## 2024-01-26 MED ORDER — ONDANSETRON HCL 4 MG/2ML IJ SOLN
4.0000 mg | Freq: Once | INTRAMUSCULAR | Status: AC
  Administered 2024-01-26: 4 mg via INTRAVENOUS
  Filled 2024-01-26: qty 2

## 2024-01-26 MED ORDER — HEPARIN BOLUS VIA INFUSION
4000.0000 [IU] | Freq: Once | INTRAVENOUS | Status: AC
Start: 2024-01-26 — End: 2024-01-26
  Administered 2024-01-26: 4000 [IU] via INTRAVENOUS
  Filled 2024-01-26: qty 4000

## 2024-01-26 MED ORDER — ASPIRIN 81 MG PO TBEC
81.0000 mg | DELAYED_RELEASE_TABLET | Freq: Every day | ORAL | Status: DC
  Administered 2024-01-28: 81 mg via ORAL
  Filled 2024-01-26: qty 1

## 2024-01-26 MED ORDER — LANTHANUM CARBONATE 1000 MG PO PACK: 1000.0000 mg | PACK | ORAL | Status: DC

## 2024-01-26 MED ORDER — AMLODIPINE BESYLATE 5 MG PO TABS: 10.0000 mg | ORAL_TABLET | Freq: Every day | ORAL | Status: DC

## 2024-01-26 MED ORDER — ACETAMINOPHEN 500 MG PO TABS
1000.0000 mg | ORAL_TABLET | Freq: Three times a day (TID) | ORAL | Status: DC
  Administered 2024-01-26 – 2024-01-28 (×5): 1000 mg via ORAL
  Filled 2024-01-26 (×4): qty 2

## 2024-01-26 MED ORDER — CYCLOBENZAPRINE HCL 10 MG PO TABS
5.0000 mg | ORAL_TABLET | Freq: Three times a day (TID) | ORAL | Status: DC | PRN
  Administered 2024-01-26 – 2024-01-27 (×2): 5 mg via ORAL
  Filled 2024-01-26: qty 0.5
  Filled 2024-01-26 (×2): qty 1

## 2024-01-26 MED ORDER — ATORVASTATIN CALCIUM 40 MG PO TABS
40.0000 mg | ORAL_TABLET | Freq: Every day | ORAL | Status: DC
  Administered 2024-01-26 – 2024-01-28 (×2): 40 mg via ORAL
  Filled 2024-01-26 (×2): qty 1

## 2024-01-26 MED ORDER — CINACALCET HCL 30 MG PO TABS
30.0000 mg | ORAL_TABLET | ORAL | Status: DC
  Administered 2024-01-26: 30 mg via ORAL
  Filled 2024-01-26 (×2): qty 1

## 2024-01-26 MED ORDER — RENA-VITE PO TABS
1.0000 | ORAL_TABLET | Freq: Every morning | ORAL | Status: DC
  Administered 2024-01-26 – 2024-01-28 (×2): 1 via ORAL
  Filled 2024-01-26 (×4): qty 1

## 2024-01-26 MED ORDER — GABAPENTIN 100 MG PO CAPS
100.0000 mg | ORAL_CAPSULE | Freq: Every day | ORAL | Status: DC
  Administered 2024-01-26 – 2024-01-27 (×2): 100 mg via ORAL
  Filled 2024-01-26 (×2): qty 1

## 2024-01-26 MED ORDER — LANTHANUM CARBONATE 500 MG PO CHEW: 1000.0000 mg | CHEWABLE_TABLET | ORAL | Status: DC | PRN

## 2024-01-26 MED ORDER — ACETAMINOPHEN 325 MG PO TABS: 650.0000 mg | ORAL_TABLET | Freq: Four times a day (QID) | ORAL | Status: DC | PRN

## 2024-01-26 NOTE — Progress Notes (Signed)
 Night team notified patient was concerned about starting heparin infusion for NSTEMI.   During our conversation, patient reiterated her presenting symptoms and understanding of a potential myocardial infarction. 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 Thus, she associates heparin administration with worsening vision.   She tells me that she was recently seen by her optometrist and had a fundus exam. No recent history of retinal hemorrhages only concerns of worsening ocular pressure. Does not remember word glaucoma used.  We discussed what troponins were and concerns for coronary artery occlusion. Team drew pictures and questions were answered. She is agreeable to start IV heparin infusion.  She continues to endorse back pain that radiates to front. Now feeling worsening crushing chest pain to central chest without radiation, n/v, dyspnea.   Today's Vitals   01/26/24 1400 01/26/24 1735 01/26/24 1900 01/26/24 1915  BP: (!) 94/46 (!) 104/56 (!) 125/48 (!) 125/48  Pulse: 77 73 78 76  Resp: (!) 27 15 20 15   Temp: 98.2 F (36.8 C) 98.5 F (36.9 C)    TempSrc:  Oral    SpO2: 99% 98% 98% 95%  Weight:      Height:      PainSc:       Body mass index is 31.62 kg/m.  In no acute distress, regular rate with systolic murmur, speaking full sentences,clear to auscultation bilaterally. No crackles.  No tendernes to palpation to the chest musculature Alert and oriented x3 with appropriate thought content Pleasant mood   Reviewed labs and recent imaging New data:  Troponin: 372 -> 1080 -> 1668 -> NEW 2148 at 1855  Plan: - Repeated EKG; no ST segment changes, reassuring - Will start heparin infusion - Serial troponins and EKG - LHC in the AM; if any clinical deterioration or worsening troponins/ EKG changes, asked RN to page IMTS service and Cardiology  Morene Crocker, MD

## 2024-01-26 NOTE — ED Provider Notes (Addendum)
 [name] DE[facility] AT [facility] Provider Note   CSN: [id number] Arrival date & time: 01/26/24  0440     History  Chief Complaint  Patient presents with   Back Pain   Sore Throat   HPI Karen [name] is a 48 y.o. female with history of ESRD, type 2 diabetes, hypertension, remote history of endocarditis presenting for back pain and sore throat.  Symptoms started 2 days ago.  The back pain is all about the mid back.  It feels sharp and at times radiates to the middle of her chest.  She endorses some intermittent shortness of breath but she is not short of breath at this time.  Is not worse with deep breaths.  Not worse with exertion.  She states she was sitting at home and noticed the back pain all of a sudden.  She also mention that she has had a sore throat for the past 2 days.  Denies drooling or throat closing sensation.  States it feels "scratchy".  Denies nasal congestion and cough.  She also mention that she had to leave dialysis early on Saturday due to diarrhea.  She has had persistent diarrhea since then.  Denies abdominal pain nausea vomiting.  Also reported that she did have endocarditis about 6 years ago.   Back Pain Sore Throat       Home Medications Prior to Admission medications   Medication Sig Start Date End Date Taking? Authorizing Provider  albuterol (VENTOLIN HFA) 108 (90 Base) MCG/ACT inhaler Inhale 1-2 puffs into the lungs every 6 (six) hours as needed for wheezing or shortness of breath.   Yes [provider]  amLODipine (NORVASC) 10 MG tablet Take 1 tablet (10 mg total) by mouth daily. Patient taking differently: Take 10 mg by mouth at bedtime. 06/12/18  Yes Osvaldo Shipper, MD  ammonium lactate (AMLACTIN) 12 % cream Apply 1 Application topically as needed for dry skin. 12/11/23  Yes Louann Sjogren, DPM  B Complex-C-Folic Acid (RENA-VITE PO) Take 1 tablet by mouth in the morning.   Yes [provider]   cinacalcet (SENSIPAR) 30 MG tablet Take 30 mg by mouth every Monday, Wednesday, and Friday. After hemodialysis   Yes [provider]  erythromycin with ethanol (THERAMYCIN) 2 % external solution Apply 1 Application topically in the morning. 04/12/21  Yes [provider]  ethyl chloride spray Apply 1 Application topically as needed (prior to dialysis). 09/29/18  Yes [provider]  FOSRENOL 1000 MG PACK Take 1,000-2,000 mg by mouth See admin instructions. Take 2 packets (2000 mg) by mouth with each meal & take 1 packet (1000 mg) by mouth with each snack 01/12/19  Yes [provider]  gabapentin (NEURONTIN) 100 MG capsule Take 100-200 mg by mouth See admin instructions. Take 2 capsules (200 mg) by mouth every morning & take 1 capsule (100 mg) by mouth at bedtime. 03/12/21  Yes [provider]  hydrALAZINE (APRESOLINE) 50 MG tablet Take 50 mg by mouth in the morning and at bedtime. 01/08/22  Yes [provider]  ketoconazole (NIZORAL) 2 % shampoo Apply 1 Application topically once a week. 09/03/21  Yes [provider]  dicyclomine (BENTYL) 20 MG tablet Take 20 mg by mouth in the morning and at bedtime. 05/19/20   [provider]  glucagon 1 MG injection Inject 1 mg into the muscle once as needed (low blood sugar). 06/16/18   [provider]  insulin aspart (NOVOLOG) 100 UNIT/ML FlexPen Inject 6-10  Units into the skin 3 (three) times daily with meals. Sliding scale insulin    [provider]  insulin glargine (LANTUS) 100 UNIT/ML Solostar Pen Inject 20 Units into the skin at bedtime.    [provider]  Insulin Pen Needle (BD PEN NEEDLE NANO U/F) 32G X 4 MM MISC Use with insulin 4x/day 12/09/19   [provider]  loperamide (IMODIUM) 2 MG capsule Take 4 mg by mouth in the morning and at bedtime. 10/15/18   [provider]  Respiratory Therapy Supplies (NEBULIZER) DEVI Use as directed every 4-6 hours  as needed 01/19/19   [provider]      Allergies    Furosemide, Heparin, and Insulin aspart (human analog)    Review of Systems   Review of Systems  Musculoskeletal:  Positive for back pain.    Physical Exam Updated Vital Signs BP (!) 123/53   Pulse 81   Temp 98.1 F (36.7 C)   Resp 18   Ht 5\' 5"  (1.651 m)   Wt 86.2 kg   SpO2 100%   BMI 31.62 kg/m  Physical Exam Vitals and nursing note reviewed.  HENT:     Head: Normocephalic and atraumatic.     Mouth/Throat:     Mouth: Mucous membranes are moist.     Tongue: No lesions. Tongue does not deviate from midline.     Pharynx: Oropharynx is clear. Uvula midline.     Tonsils: No tonsillar exudate or tonsillar abscesses.  Eyes:     General:        Right eye: No discharge.        Left eye: No discharge.     Conjunctiva/sclera: Conjunctivae normal.  Cardiovascular:     Rate and Rhythm: Normal rate and regular rhythm.     Pulses: Normal pulses.     Heart sounds: Normal heart sounds.  Pulmonary:     Effort: Pulmonary effort is normal.     Breath sounds: Normal breath sounds.  Abdominal:     General: Abdomen is flat.     Palpations: Abdomen is soft.  Lymphadenopathy:     Cervical: No cervical adenopathy.  Skin:    General: Skin is warm and dry.  Neurological:     General: No focal deficit present.  Psychiatric:        Mood and Affect: Mood normal.     ED Results / Procedures / Treatments   Labs (all labs ordered are listed, but only abnormal results are displayed) Labs Reviewed  BASIC METABOLIC PANEL - Abnormal; Notable for the following components:      Result Value   Potassium 5.2 (*)    CO2 21 (*)    Glucose, Bld 128 (*)    BUN 57 (*)    Creatinine, Ser 8.35 (*)    GFR, Estimated 5 (*)    All other components within normal limits  CBC - Abnormal; Notable for the following components:   RBC 3.37 (*)    Hemoglobin 10.4 (*)    HCT 32.6 (*)    All other components within normal limits  D-DIMER,  QUANTITATIVE - Abnormal; Notable for the following components:   D-Dimer, Quant 0.72 (*)    All other components within normal limits  TROPONIN I (HIGH SENSITIVITY) - Abnormal; Notable for the following components:   Troponin I (High Sensitivity) 372 (*)    All other components within normal limits  TROPONIN I (HIGH SENSITIVITY) - Abnormal; Notable for the following components:  Troponin I (High Sensitivity) 612 (*)    All other components within normal limits  RESP PANEL BY RT-PCR (RSV, FLU A&B, COVID)  RVPGX2  GROUP A STREP BY PCR  HIV ANTIBODY (ROUTINE TESTING W REFLEX)    EKG EKG Interpretation Date/Time:  Monday January 26 2024 04:53:19 EST Ventricular Rate:  92 PR Interval:  192 QRS Duration:  76 QT Interval:  366 QTC Calculation: 452 R Axis:   90  Text Interpretation: Normal sinus rhythm Rightward axis Borderline ECG When compared with ECG of 05-Nov-2021 23:51, PREVIOUS ECG IS PRESENT Confirmed by Margarita Grizzle [phone]) on 01/26/2024 8:44:37 AM  Radiology DG Chest 2 View Result Date: 01/26/2024 CLINICAL DATA:  Chest pain, back pain and sore throat. EXAM: CHEST - 2 VIEW COMPARISON:  Portable chest 08/06/2022 FINDINGS: There is mild cardiomegaly but the heart is less enlarged than previously. Mild central vascular prominence is present but the interstitial edema noted previously is not seen today. The mediastinum is normally outlined. The lungs are clear of infiltrates. There is increased linear scarring or atelectasis in the left base. There is no substantial pleural effusion.  Thoracic cage is intact. IMPRESSION: 1. Mild cardiomegaly and central vascular prominence. The heart is less enlarged than previously. No overt edema. 2. Increased linear scarring or atelectasis in the left base. No focal pneumonic infiltrate. Electronically Signed   By: Almira Bar M.D.   On: 01/26/2024 06:14    Procedures .Critical Care  Performed by: Gareth Eagle, PA-C Authorized by:  Gareth Eagle, PA-C   Critical care provider statement:    Critical care time (minutes):  30   Critical care was necessary to treat or prevent imminent or life-threatening deterioration of the following conditions: concern for NSTEMI vs PE, rising troponins.   Critical care was time spent personally by me on the following activities:  Development of treatment plan with patient or surrogate, discussions with consultants, evaluation of patient's response to treatment, examination of patient, ordering and review of laboratory studies, ordering and review of radiographic studies, ordering and performing treatments and interventions, pulse oximetry, re-evaluation of patient's condition and review of old charts     Medications Ordered in ED Medications  aspirin tablet 325 mg (325 mg Oral Given 01/26/24 1005)  morphine (PF) 4 MG/ML injection 4 mg (4 mg Intravenous Given 01/26/24 0826)  ondansetron (ZOFRAN) injection 4 mg (4 mg Intravenous Given 01/26/24 0826)  iohexol (OMNIPAQUE) 350 MG/ML injection 75 mL (75 mLs Intravenous Contrast Given 01/26/24 1037)    ED Course/ Medical Decision Making/ A&P                                 Medical Decision Making Amount and/or Complexity of Data Reviewed Labs: ordered. Radiology: ordered.  Risk OTC drugs. Prescription drug management. Decision regarding hospitalization.   Initial Impression and Ddx 48 year old well-appearing female presenting for back pain and sore throat.  Exam was unremarkable.  Noted that her initial troponin was 372.  DDx includes ACS, PE, aortic dissection, pneumonia, myocarditis or endocarditis, pneumothorax, peritonsillar abscess, strep pharyngitis, viral URI, other. Patient PMH that increases complexity of ED encounter:  history of ESRD, type 2 diabetes, hypertension, remote history of endocarditis   Interpretation of Diagnostics - I independent reviewed and interpreted the labs as followed: First troponin 372, 2nd was 612,  hyperkalemic (5.2)  - I independently visualized the following imaging with scope of interpretation limited to determining acute  life threatening conditions related to emergency care: Chest x-ray, which revealed cardiomegaly appears to be improved compared to prior imaging and central vascular congestion  -I personally reviewed and interpreted EKG which revealed normal sinus rhythm  Patient Reassessment and Ultimate Disposition/Management On reassessment patient stated that the back and chest pain had improved overall.  Primary concern is possible NSTEMI.  Initially discussed patient with Dr. Antoine Poche of cardiology.  He advised that for now admit to medicine and continue to trend her troponins.  Also thought CTA for PE rule out was indicated as well.  On serial reassessments, patient remained stable, clinically well-appearing and in no acute distress.  Admitted to hospital service with Dr. Antony Contras.  Patient management required discussion with the following services or consulting groups:  Hospitalist Service and Cardiology  Complexity of Problems Addressed Acute complicated illness or Injury  Additional Data Reviewed and Analyzed Further history obtained from: Past medical history and medications listed in the EMR and Prior ED visit notes  Patient Encounter Risk Assessment Consideration of hospitalization         Final Clinical Impression(s) / ED Diagnoses Final diagnoses:  Low back pain, unspecified back pain laterality, unspecified chronicity, unspecified whether sciatica present  Atypical chest pain    Rx / DC Orders ED Discharge Orders     None         Gareth Eagle, PA-C 01/26/24 1122    Gareth Eagle, PA-C 01/26/24 1210    Margarita Grizzle, MD 01/26/24 1330

## 2024-01-26 NOTE — Consult Note (Addendum)
 [facility] Renal Consultation Note    Indication for Consultation:  Management of ESRD/hemodialysis; anemia, hypertension/volume and secondary hyperparathyroidism PCP: Yvonne Kendall PA   HPI: Karen [name] is a 48 y.o. female with ESRD on hemodialysis MWF at [facility]. 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 She presented to ED for evaluation of chest pain. Last full HD Friday, attempted to go for extra treatment 01/24/2024 but had diarrhea and had to leave early.   She says she developed pain in shoulders, "sharp in nature" did not vary with respirations, is currently localizing chest pain. She tells me pain was relieved with muscle relaxer and tylenol, says pain is now "6" on scale 1/10. She says she "feels like she has fluid on" but no peripheral edema. CXR without overt edema. This patient's edema is usually lower abdomen and possibly has trace edema present. K+ 5.2 on arrival to ED, SCr 8.36 BUN 57 Co2 21. Troponin has uptrended from [phone]. Seen by cardiology, thought to be NSTEMI type II in setting of GI illness. She has been admitted as observation patient for chest pain. Lokelma 10 grams PO which has not been given yet. RN notified. We will manage dialysis, optimize volume with HD.   Past Medical History:  Diagnosis Date   ADHD (attention deficit hyperactivity disorder)    Anemia    Arthritis    Asthma    mild = rarely uses inhaler   Blindness of left eye    retinopathy   Diabetes mellitus without complication (HCC)    type 2   Dyspnea    with exertion   Fibromyalgia    GERD (gastroesophageal reflux disease)    no meds   Hypertension    Lung nodule    1.0 cm LLL lung nodule 07/16/22 CT (evaluation by [facility] Dr. Rosebud Poles)   Osteomyelitis [facility])    RIGHT FOOT FIFTH TOE   Ovarian mass, left 06/26/2021   Renal disorder    ESRD   Sleep apnea    does not use cpap   Past Surgical History:  Procedure  Laterality Date   ABDOMINAL AORTOGRAM W/LOWER EXTREMITY N/A 06/08/2018   Procedure: ABDOMINAL AORTOGRAM W/LOWER EXTREMITY;  Surgeon: Maeola Harman, MD;  Location: [facility] INVASIVE CV LAB;  Service: Cardiovascular;  Laterality: N/A;   ABDOMINAL HYSTERECTOMY  09/2016   AMPUTATION Right 06/11/2018   Procedure: AMPUTATION RIGHT FIFTH TOE;  Surgeon: Chuck Hint, MD;  Location: [facility] OR;  Service: Vascular;  Laterality: Right;   APPENDECTOMY     AV FISTULA PLACEMENT Left    CHOLECYSTECTOMY     COLON SURGERY     COLONOSCOPY  2023   cysts removed from top of head  1978   age 22yr   EYE SURGERY Bilateral    retinal - left eye blind   EYE SURGERY Right    cataract removed with lens placement   INSERTION OF DIALYSIS CATHETER N/A 08/06/2022   Procedure: INSERTION OF RIGHT INTERNAL JUGULAR TUNNELED DIALYSIS CATHETER;  Surgeon: Chuck Hint, MD;  Location: [facility] OR;  Service: Vascular;  Laterality: N/A;   reimplantation of uterer     at age 52 yrs   REVISON OF ARTERIOVENOUS FISTULA Left 08/06/2022   Procedure: REVISON OF LEFT ARTERIOVENOUS FISTULA WITH REPAIR OF PSEUDOANEURYSMS AND SUPERFISTULIZATIONS;  Surgeon: Chuck Hint, MD;  Location: [facility] OR;  Service: Vascular;  Laterality: Left;   TRIGGER FINGER RELEASE     right hand ring finger  TUBAL LIGATION     UPPER GI ENDOSCOPY  2017   WISDOM TOOTH EXTRACTION     Family History  Problem Relation Age of Onset   Breast cancer Mother    Diabetes Father    Breast cancer Maternal Grandmother    Stomach cancer Maternal Grandmother    Diabetes Paternal Grandmother    Breast cancer Maternal Aunt    COPD Maternal Aunt    COPD Maternal Aunt    Heart failure Maternal Aunt    COPD Maternal Aunt    Kidney cancer Maternal Aunt    Social History:  reports that she quit smoking about 3 years ago. Her smoking use included cigarettes. She started smoking about 28 years ago. She has a 25 pack-year smoking history. She has never  been exposed to tobacco smoke. She has never used smokeless tobacco. She reports that she does not currently use alcohol. She reports current drug use. Drug: Marijuana. Allergies  Allergen Reactions   Furosemide Swelling   Heparin Other (See Comments)    Patient relates vitreous hemorrhage after heparin. Patient relates vitreous hemorrhage after heparin.   Insulin Aspart (Human Analog) Swelling    States she takes insulin it just has mild side effects   Prior to Admission medications   Medication Sig Start Date End Date Taking? Authorizing Provider  albuterol (VENTOLIN HFA) 108 (90 Base) MCG/ACT inhaler Inhale 1-2 puffs into the lungs every 6 (six) hours as needed for wheezing or shortness of breath.   Yes [provider]  amLODipine (NORVASC) 10 MG tablet Take 1 tablet (10 mg total) by mouth daily. Patient taking differently: Take 10 mg by mouth at bedtime. 06/12/18  Yes Osvaldo Shipper, MD  ammonium lactate (AMLACTIN) 12 % cream Apply 1 Application topically as needed for dry skin. 12/11/23  Yes Louann Sjogren, DPM  B Complex-C-Folic Acid (RENA-VITE PO) Take 1 tablet by mouth in the morning.   Yes [provider]  cinacalcet (SENSIPAR) 30 MG tablet Take 30 mg by mouth every Monday, Wednesday, and Friday. After hemodialysis   Yes [provider]  erythromycin with ethanol (THERAMYCIN) 2 % external solution Apply 1 Application topically in the morning. 04/12/21  Yes [provider]  ethyl chloride spray Apply 1 Application topically as needed (prior to dialysis). 09/29/18  Yes [provider]  FOSRENOL 1000 MG PACK Take 1,000-2,000 mg by mouth See admin instructions. Take 2 packets (2000 mg) by mouth with each meal & take 1 packet (1000 mg) by mouth with each snack 01/12/19  Yes [provider]  gabapentin (NEURONTIN) 100 MG capsule Take 100-200 mg by mouth See admin instructions. Take 2 capsules (200 mg) by mouth every morning & take 1 capsule  (100 mg) by mouth at bedtime. 03/12/21  Yes [provider]  hydrALAZINE (APRESOLINE) 50 MG tablet Take 50 mg by mouth in the morning and at bedtime. 01/08/22  Yes [provider]  ketoconazole (NIZORAL) 2 % shampoo Apply 1 Application topically once a week. 09/03/21  Yes [provider]  dicyclomine (BENTYL) 20 MG tablet Take 20 mg by mouth in the morning and at bedtime. 05/19/20   [provider]  glucagon 1 MG injection Inject 1 mg into the muscle once as needed (low blood sugar). 06/16/18   [provider]  insulin aspart (NOVOLOG) 100 UNIT/ML FlexPen Inject 6-10 Units into the skin 3 (three) times daily with meals. Sliding scale insulin    [provider]  insulin glargine (LANTUS) 100  UNIT/ML Solostar Pen Inject 20 Units into the skin at bedtime.    [provider]  Insulin Pen Needle (BD PEN NEEDLE NANO U/F) 32G X 4 MM MISC Use with insulin 4x/day 12/09/19   [provider]  loperamide (IMODIUM) 2 MG capsule Take 4 mg by mouth in the morning and at bedtime. 10/15/18   [provider]  Respiratory Therapy Supplies (NEBULIZER) DEVI Use as directed every 4-6 hours as needed 01/19/19   [provider]   Current Facility-Administered Medications  Medication Dose Route Frequency Provider Last Rate Last Admin   acetaminophen (TYLENOL) tablet 1,000 mg  1,000 mg Oral TID Champ Mungo, DO   1,000 mg at 01/26/24 1415   albuterol (PROVENTIL) (2.5 MG/3ML) 0.083% nebulizer solution 2.5 mg  2.5 mg Inhalation Q6H PRN Champ Mungo, DO       [START ON 01/27/2024] aspirin EC tablet 81 mg  81 mg Oral Daily Champ Mungo, DO       atorvastatin (LIPITOR) tablet 40 mg  40 mg Oral Daily Champ Mungo, DO       cinacalcet (SENSIPAR) tablet 30 mg  30 mg Oral Q M,W,F Champ Mungo, DO   30 mg at 01/26/24 1301   cyclobenzaprine (FLEXERIL) tablet 5 mg  5 mg Oral TID PRN Champ Mungo, DO   5 mg at 01/26/24 1416   gabapentin (NEURONTIN) capsule 100 mg   100 mg Oral QHS Champ Mungo, DO       gabapentin (NEURONTIN) capsule 200 mg  200 mg Oral Daily Champ Mungo, DO   200 mg at 01/26/24 1300   lanthanum (FOSRENOL) chewable tablet 1,000 mg  1,000 mg Oral PRN Champ Mungo, DO       lanthanum (FOSRENOL) chewable tablet 2,000 mg  2,000 mg Oral TID WC Champ Mungo, DO   2,000 mg at 01/26/24 1417   lidocaine (LIDODERM) 5 % 1 patch  1 patch Transdermal Q24H Champ Mungo, DO   1 patch at 01/26/24 1416   multivitamin (RENA-VIT) tablet 1 tablet  1 tablet Oral q AM Champ Mungo, DO   1 tablet at 01/26/24 1301   sodium zirconium cyclosilicate (LOKELMA) packet 10 g  10 g Oral Once Pola Corn, NP       Current Outpatient Medications  Medication Sig Dispense Refill   albuterol (VENTOLIN HFA) 108 (90 Base) MCG/ACT inhaler Inhale 1-2 puffs into the lungs every 6 (six) hours as needed for wheezing or shortness of breath.     amLODipine (NORVASC) 10 MG tablet Take 1 tablet (10 mg total) by mouth daily. (Patient taking differently: Take 10 mg by mouth at bedtime.) 30 tablet 0   ammonium lactate (AMLACTIN) 12 % cream Apply 1 Application topically as needed for dry skin. 385 g 0   B Complex-C-Folic Acid (RENA-VITE PO) Take 1 tablet by mouth in the morning.     cinacalcet (SENSIPAR) 30 MG tablet Take 30 mg by mouth every Monday, Wednesday, and Friday. After hemodialysis     erythromycin with ethanol (THERAMYCIN) 2 % external solution Apply 1 Application topically in the morning.     ethyl chloride spray Apply 1 Application topically as needed (prior to dialysis).  2   FOSRENOL 1000 MG PACK Take 1,000-2,000 mg by mouth See admin instructions. Take 2 packets (2000 mg) by mouth with each meal & take 1 packet (1000 mg) by mouth with each snack     gabapentin (NEURONTIN) 100 MG capsule Take 100-200 mg by mouth See admin instructions.  Take 2 capsules (200 mg) by mouth every morning & take 1 capsule (100 mg) by mouth at bedtime.     hydrALAZINE (APRESOLINE) 50 MG tablet  Take 50 mg by mouth in the morning and at bedtime.     ketoconazole (NIZORAL) 2 % shampoo Apply 1 Application topically once a week.     dicyclomine (BENTYL) 20 MG tablet Take 20 mg by mouth in the morning and at bedtime.     glucagon 1 MG injection Inject 1 mg into the muscle once as needed (low blood sugar).     insulin aspart (NOVOLOG) 100 UNIT/ML FlexPen Inject 6-10 Units into the skin 3 (three) times daily with meals. Sliding scale insulin     insulin glargine (LANTUS) 100 UNIT/ML Solostar Pen Inject 20 Units into the skin at bedtime.     Insulin Pen Needle (BD PEN NEEDLE NANO U/F) 32G X 4 MM MISC Use with insulin 4x/day     loperamide (IMODIUM) 2 MG capsule Take 4 mg by mouth in the morning and at bedtime.     Respiratory Therapy Supplies (NEBULIZER) DEVI Use as directed every 4-6 hours as needed     Labs: Basic Metabolic Panel: Recent Labs  Lab 01/26/24 0539  NA 139  K 5.2*  CL 103  CO2 21*  GLUCOSE 128*  BUN 57*  CREATININE 8.35*  CALCIUM 9.2   Liver Function Tests: No results for input(s): "AST", "ALT", "ALKPHOS", "BILITOT", "PROT", "ALBUMIN" in the last 168 hours. No results for input(s): "LIPASE", "AMYLASE" in the last 168 hours. No results for input(s): "AMMONIA" in the last 168 hours. CBC: Recent Labs  Lab 01/26/24 0539  WBC 8.7  HGB 10.4*  HCT 32.6*  MCV 96.7  PLT 219   Cardiac Enzymes: No results for input(s): "CKTOTAL", "CKMB", "CKMBINDEX", "TROPONINI" in the last 168 hours. CBG: No results for input(s): "GLUCAP" in the last 168 hours. Iron Studies: No results for input(s): "IRON", "TIBC", "TRANSFERRIN", "FERRITIN" in the last 72 hours. Studies/Results: CT Angio Chest PE W/Cm &/Or Wo Cm Result Date: 01/26/2024 CLINICAL DATA:  Positive D-dimer. Cough and sore throat. Concern for pulmonary embolism. EXAM: CT ANGIOGRAPHY CHEST WITH CONTRAST TECHNIQUE: Multidetector CT imaging of the chest was performed using the standard protocol during bolus administration  of intravenous contrast. Multiplanar CT image reconstructions and MIPs were obtained to evaluate the vascular anatomy. RADIATION DOSE REDUCTION: This exam was performed according to the departmental dose-optimization program which includes automated exposure control, adjustment of the mA and/or kV according to patient size and/or use of iterative reconstruction technique. CONTRAST:  75mL OMNIPAQUE IOHEXOL 350 MG/ML SOLN COMPARISON:  Chest radiograph dated 01/26/2024. FINDINGS: Cardiovascular: There is no cardiomegaly. There is 3 vessel coronary vascular calcification. Small pericardial effusion. Mild atherosclerotic calcification of the thoracic aorta. No aneurysmal dilatation or dissection. The origins of the great vessels of the aortic arch appear patent. There is mild dilatation of the main pulmonary trunk suggestive of pulmonary hypertension. Evaluation of the pulmonary arteries is limited due to suboptimal opacification and timing of the contrast. No central pulmonary artery embolus identified. Mediastinum/Nodes: No hilar adenopathy. The esophagus is grossly unremarkable. No mediastinal fluid collection. Lungs/Pleura: Left lung base scarring and postsurgical changes. Rounded subpleural consolidation in the superior segment of the left lower lobe likely an area of round atelectasis. Pneumonia or underlying mass is not excluded. Direct comparison with prior CT images, if available, or follow-up recommended to document stability/resolution. No pleural effusion or pneumothorax. The central airways are patent. Upper  Abdomen: No acute abnormality. Musculoskeletal: Renal osteodystrophy.  No acute osseous pathology. Review of the MIP images confirms the above findings. IMPRESSION: 1. No central pulmonary artery embolus identified. 2. Left lung base scarring and postsurgical changes. Rounded subpleural consolidation in the superior segment of the left lower lobe likely an area of round atelectasis. Pneumonia or  underlying mass is not excluded. Direct comparison with prior CT images, if available, or follow-up recommended to document stability/resolution. 3.  Aortic Atherosclerosis (ICD10-I70.0). Electronically Signed   By: Elgie Collard M.D.   On: 01/26/2024 11:19   DG Chest 2 View Result Date: 01/26/2024 CLINICAL DATA:  Chest pain, back pain and sore throat. EXAM: CHEST - 2 VIEW COMPARISON:  Portable chest 08/06/2022 FINDINGS: There is mild cardiomegaly but the heart is less enlarged than previously. Mild central vascular prominence is present but the interstitial edema noted previously is not seen today. The mediastinum is normally outlined. The lungs are clear of infiltrates. There is increased linear scarring or atelectasis in the left base. There is no substantial pleural effusion.  Thoracic cage is intact. IMPRESSION: 1. Mild cardiomegaly and central vascular prominence. The heart is less enlarged than previously. No overt edema. 2. Increased linear scarring or atelectasis in the left base. No focal pneumonic infiltrate. Electronically Signed   By: Almira Bar M.D.   On: 01/26/2024 06:14    ROS: As per HPI otherwise negative.   Physical Exam: Vitals:   01/26/24 0831 01/26/24 0900 01/26/24 1105 01/26/24 1400  BP:  124/74 (!) 123/53 (!) 94/46  Pulse:  80 81 77  Resp:  (!) 21 18 (!) 27  Temp:  98 F (36.7 C) 98.1 F (36.7 C) 98.2 F (36.8 C)  SpO2:  (!) 88% 100% 99%  Weight: 86.2 kg     Height: 5\' 5"  (1.651 m)        General: Chronically ill appearing female in no acute distress. Head: Normocephalic, atraumatic, sclera non-icteric, mucus membranes are moist Neck: Supple. JVD not elevated. Lungs: Clear bilaterally to auscultation without wheezes, rales, or rhonchi. Breathing is unlabored. Heart: S1,S2 2/6 systolic M.  Abdomen: NABS, obese.  Lower extremities: Trace pre tib edema BLE Neuro: Alert and oriented X 3. Moves all extremities spontaneously. Psych:  Responds to questions  appropriately with a normal affect. Dialysis Access: L AVF + T/B  Dialysis Orders: Center: [facility] MWF 4 hrs 200NRe 400/600 85 kg 2.0 K/ 2.5 Ca AVF - No heparin - Epogen 1400 units IV three times per week    Assessment/Plan:  Atypical Chest pain-W/U per primary. Cardiology consulted.   ESRD -  MWF. HD later today.  Hyperkalemia-mild. Lokelma 10 grams PO ordered. Follow labs.   Hypertension/volume  - BP and volume appear well controlled. UF as tolerated.   Anemia  - HGB 10.4 Continue EPO as ordered  Metabolic bone disease -  No VDRA. 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 Per primary  Dene Gentry. Manson Passey, NP-C 01/26/2024, 3:35 PM  [employer] [phone]    Seen and examined independently.  Agree with note and exam as documented above by physician extender and as noted here.  ESRD patient on HD MWF ([facility]) presented with chest pain and back pain.  She was concerned about cardiac etiology.  Had a CTA without PE or acute process.  Her sister is here at bedside. Patient reports a sore throat recently. States that she carries extra  weight in her abdomen when she has it.   General adult female in bed in no acute distress HEENT normocephalic atraumatic extraocular movements intact sclera anicteric Neck supple trachea midline Lungs clear to auscultation bilaterally normal work of breathing at rest on room air Heart S1S2 no rub Abdomen soft nontender distended/obese habitus Extremities no pitting edema  Psych normal mood and affect  ESRD - HD orders are in; no emergent indication   Chest pain - cardiology has been consulted.  S/p CTA without acute process noted.   Hyperkalemia - lokelma today  HTN - improved control   Anemia of CKD - no acute indication for ESA and note cardiac work-up ongoing   Metabolic bone disease - on fosrenol and sensipar per above.   Thank you for the consult.  Please do not hesitate to contact me with any questions regarding our patient   Estanislado Emms, MD 01/26/2024  7:35 PM

## 2024-01-26 NOTE — H&P (Cosign Needed Addendum)
 Date: 01/26/2024               Patient Name:  Karen [name] MRN: [id number]  DOB: [birth year] Age / Sex: 48 y.o., female   PCP: Loura Pardon, PA         Medical Service: Internal Medicine Teaching Service         Attending Physician: Dr. Reymundo Poll, MD    First Contact: Dr. Faith Rogue Pager: [phone]  Second Contact: Dr. Champ Mungo Pager: [phone]       After Hours (After 5p/  First Contact Pager: [phone]  weekends / holidays): Second Contact Pager: [phone]   Chief Complaint: back pain, sore throat  History of Present Illness: Karen [name] is a 48 y.o. F with pertinent PMH of ESRD on MWF HD, HTN, asthma, fibromyalgia who presented with radiating back pain and sore throat admitted for further work up for back/chest pain.  Annjanette explains that yesterday evening she noted back pain between the bra strap and shoulders that radiated around to her chest. There was no change in the pain with various body positions. She tried using a back roller, heat which did not provide relief. She has never had anything like this before and does not experience chest pain at rest or with exertion. She has a history of exertional dyspnea but this is not different from baseline and she has not required use of her inhaler for dyspnea. She has not done any recent heavy lifting or exercise. She has not had fever or chills, no malaise. She did recently have a sore, raw throat but that has since resolved over the last 3 days. She has not had any recent travel and ambulates at home with the assistance of a walker. She has no history of blood clots.   She does endorse that as her back pain has been under better control, she has been noticing centralized chest tightness/oressure over the last 1 hour. She feels that the severe back pain was masking the chest pain to begin with.   She also notes a single episode of jolting severe pain over her chest yesterday evening when she say a  warm water bottle on her chest as she was positioning herself to use the bottle on her back.  Pertinent labs include Hgb 10.4, HCT 32.6; K 5.2, bicarbonate 21, BUN/Cr 57/8.35, GFR 5; negative strep PCR; negative for COVID-19, RSV, influenza; troponin 372 > 612; D-dimer elevated at 0.72. She received morphine in the ED with some relief of her pain. CTA chest was ordered and is pending at this time for PE rule-out. VSS, no respiratory distress.EDP has consulted nephrology and cardiology. IMTS paged for admission.  Past Medical History: Past Medical History:  Diagnosis Date   ADHD (attention deficit hyperactivity disorder)    Anemia    Arthritis    Asthma    mild = rarely uses inhaler   Blindness of left eye    retinopathy   Diabetes mellitus without complication (HCC)    type 2   Dyspnea    with exertion   Fibromyalgia    GERD (gastroesophageal reflux disease)    no meds   Hypertension    Lung nodule    1.0 cm LLL lung nodule 07/16/22 CT (evaluation by [facility] Dr. Rosebud Poles)   Osteomyelitis [facility])    RIGHT FOOT FIFTH TOE   Ovarian mass, left 06/26/2021   Renal disorder    ESRD   Sleep apnea    does  not use cpap   Past Surgical History: Past Surgical History:  Procedure Laterality Date   ABDOMINAL AORTOGRAM W/LOWER EXTREMITY N/A 06/08/2018   Procedure: ABDOMINAL AORTOGRAM W/LOWER EXTREMITY;  Surgeon: Maeola Harman, MD;  Location: [facility] INVASIVE CV LAB;  Service: Cardiovascular;  Laterality: N/A;   ABDOMINAL HYSTERECTOMY  09/2016   AMPUTATION Right 06/11/2018   Procedure: AMPUTATION RIGHT FIFTH TOE;  Surgeon: Chuck Hint, MD;  Location: [facility] OR;  Service: Vascular;  Laterality: Right;   APPENDECTOMY     AV FISTULA PLACEMENT Left    CHOLECYSTECTOMY     COLON SURGERY     COLONOSCOPY  2023   cysts removed from top of head  1978   age 31yr   EYE SURGERY Bilateral    retinal - left eye blind   EYE SURGERY Right    cataract removed with lens placement   INSERTION  OF DIALYSIS CATHETER N/A 08/06/2022   Procedure: INSERTION OF RIGHT INTERNAL JUGULAR TUNNELED DIALYSIS CATHETER;  Surgeon: Chuck Hint, MD;  Location: [facility] OR;  Service: Vascular;  Laterality: N/A;   reimplantation of uterer     at age 62 yrs   REVISON OF ARTERIOVENOUS FISTULA Left 08/06/2022   Procedure: REVISON OF LEFT ARTERIOVENOUS FISTULA WITH REPAIR OF PSEUDOANEURYSMS AND SUPERFISTULIZATIONS;  Surgeon: Chuck Hint, MD;  Location: [facility] OR;  Service: Vascular;  Laterality: Left;   TRIGGER FINGER RELEASE     right hand ring finger   TUBAL LIGATION     UPPER GI ENDOSCOPY  2017   WISDOM TOOTH EXTRACTION     Meds:  Current Meds  Medication Sig   albuterol (VENTOLIN HFA) 108 (90 Base) MCG/ACT inhaler Inhale 1-2 puffs into the lungs every 6 (six) hours as needed for wheezing or shortness of breath.   amLODipine (NORVASC) 10 MG tablet Take 1 tablet (10 mg total) by mouth daily. (Patient taking differently: Take 10 mg by mouth at bedtime.)   ammonium lactate (AMLACTIN) 12 % cream Apply 1 Application topically as needed for dry skin.   B Complex-C-Folic Acid (RENA-VITE PO) Take 1 tablet by mouth in the morning.   cinacalcet (SENSIPAR) 30 MG tablet Take 30 mg by mouth every Monday, Wednesday, and Friday. After hemodialysis   erythromycin with ethanol (THERAMYCIN) 2 % external solution Apply 1 Application topically in the morning.   ethyl chloride spray Apply 1 Application topically as needed (prior to dialysis).   FOSRENOL 1000 MG PACK Take 1,000-2,000 mg by mouth See admin instructions. Take 2 packets (2000 mg) by mouth with each meal & take 1 packet (1000 mg) by mouth with each snack   gabapentin (NEURONTIN) 100 MG capsule Take 100-200 mg by mouth See admin instructions. Take 2 capsules (200 mg) by mouth every morning & take 1 capsule (100 mg) by mouth at bedtime.   hydrALAZINE (APRESOLINE) 50 MG tablet Take 50 mg by mouth in the morning and at bedtime.   ketoconazole (NIZORAL) 2 %  shampoo Apply 1 Application topically once a week.   Allergies: Allergies as of 01/26/2024 - Review Complete 01/26/2024  Allergen Reaction Noted   Furosemide Swelling 09/11/2021   Heparin Other (See Comments) 06/07/2018   Insulin aspart (human analog) Swelling 04/02/2015   Family History:  Family History  Problem Relation Age of Onset   Breast cancer Mother    Diabetes Father    Breast cancer Maternal Grandmother    Stomach cancer Maternal Grandmother    Diabetes Paternal Grandmother    Breast cancer  Maternal Aunt    COPD Maternal Aunt    COPD Maternal Aunt    Heart failure Maternal Aunt    COPD Maternal Aunt    Kidney cancer Maternal Aunt     Social History: No alcohol, tobacco use but daily marijuana use to help with pain. Requires assistance with ADLs and IADLs, especially after HD. Uses a walker at home and wheelchair out of the house. PCP is at [facility].  Review of Systems: A complete ROS was negative except as per HPI.   Physical Exam: Blood pressure (!) 123/53, pulse 81, temperature 98.1 F (36.7 C), resp. rate 18, height 5\' 5"  (1.651 m), weight 86.2 kg, SpO2 100%. Constitutional:Appears stated age, in no acute distress. HEENT: No pharyngeal erythema or exudate. Cardio:Regular rate and rhythm. Murmur appreciated, suspect related to AV fistula. No JVD appreciated.  Pulm:Clear to auscultation bilaterally. Normal work of breathing on room air. Chest:Mild tenderness to palpation of chest wall. Abdomen:Soft, nontender, nondistended. WUJ:[id number] for extremity edema. No rashes or lesions on upper thorax in area of concern. No tenderness to palpation of thorax or spine. Skin:Warm and dry. Neuro:Alert and oriented x3. No focal deficit noted. Psych:Pleasant mood and affect.  EKG: NSR.  CXR:  1. Mild cardiomegaly and central vascular prominence. The heart is less enlarged than previously. No overt edema. 2. Increased linear scarring or atelectasis in the left base.  No focal pneumonic infiltrate.  CTA chest: 1. No central pulmonary artery embolus identified. 2. Left lung base scarring and postsurgical changes. Rounded subpleural consolidation in the superior segment of the left lower lobe likely an area of round atelectasis. Pneumonia or underlying mass is not excluded. Direct comparison with prior CT images, if available, or follow-up recommended to document stability/resolution. 3.  Aortic Atherosclerosis (ICD10-I70.0).  Assessment & Plan by Problem: Principal Problem:   NSTEMI (non-ST elevated myocardial infarction) (HCC) Active Problems:   ESRD on hemodialysis (HCC)   Essential hypertension   Mild intermittent asthma without complication   Chest pain  NSTEMI Atypical chest pain Low suspicion for pericarditis given unrevealing EKG or pulmonary embolism (Wells score is 0). She has no recent history of increased exertion but mild tenderness to palpation of chest wall could indicate a MSK cause. Regardless in the setting of rising troponins I do worry about NSTEMI (EKG without ST elevations). History of fibromyalgia but this does not seem consistent with fibro pain.  CTA negative for PE. S/p ASA 325 mg daily. Plan: -Cardiology consulted, appreciate their recommendations -Echocardiogram -Trend troponins -Check lipid panel; will start atorvastatin 40 mg daily -ASA 81 mg daily; unable to use ACEI or ARB given ESRD; no indication for supplemental O2; unable to use heparin as it is listed as allergy; no B-blocker given VS at this time  Back pain Bilateral nature and normal skin findings with lack of itch/burning/clear dermatomal distribution decreases suspicion of shingles for example. Does not appear to be improved with change of body positions. ESRD limits safe pain medication options. I do not feel that she needs additional opioid medication. May be related to NSTEMI, though lower suspicion for this. Plan: -Acetaminophen 1000 mg  TID -Aquathermia -Lidocaine patch -Cyclobenzaprine 5 mg TID PRN back pain/spasms  Abnormal CTA Is s/p VATS with L lower lobectomy 08/2022 due to LLL mass. No respiratory distress or increased work of breathing compared to baseline extertional dyspnea. No prior CT imaging for comparison. Plan: -Outpatient follow-up  Type 2 diabetes mellitus Has insulin pump. Plan: -CBG TID with meals; continue use  of personal insulin pump  ESRD on MWF HD Plan: -Nephrology consulted, appreciate their assistance with HD -Continue home cinacalcet 30 mg daily on MWF, lanthanum 2000 mg with full meal and 1000 mg with snacks, rena-vit 1 tablet daily  HTN On amlodipine 10 mg daily at home; hydralazine is reportedly PRN. Plan: -Restart home amlodipine 10 mg daily when BP more stable  Fibromyalgia Plan: -Continue home gabapentin 200 mg each morning and 100 mg each evening  Asthma No respiratory distress, stable on RA. Normal lung sounds on exam. No concern for acute exacerbation. Plan: -Albuterol 1 puff q6h PRN wheezing, shortness of breath  Dispo: Admit patient to Observation with expected length of stay less than 2 midnights.  Signed: Champ Mungo, DO 01/26/2024, 1:24 PM  After 5pm on weekdays and 1pm on weekends: On Call pager: [phone]

## 2024-01-26 NOTE — ED Triage Notes (Addendum)
 Pt in with what she believes is a swollen lymphnode to R neck x 2 days and pain between shoulders. Pt reports mild cough and sore throat. Pt usually has dialysis MWF, last session was Saturday. No sob reported, +mild cp

## 2024-01-26 NOTE — Progress Notes (Signed)
 PHARMACY - ANTICOAGULATION CONSULT NOTE  Pharmacy Consult for heparin infusion Indication: chest pain/ACS  Allergies  Allergen Reactions   Furosemide Swelling   Heparin Other (See Comments)    Patient relates vitreous hemorrhage after heparin. Patient relates vitreous hemorrhage after heparin.   Insulin Aspart (Human Analog) Swelling    States she takes insulin it just has mild side effects    Patient Measurements: Height: 5\' 5"  (165.1 cm) Weight: 86.2 kg (190 lb) IBW/kg (Calculated) : 57 Heparin Dosing Weight: 75.7 kg  Vital Signs: Temp: 98.5 F (36.9 C) (02/24 1735) Temp Source: Oral (02/24 1735) BP: 104/56 (02/24 1735) Pulse Rate: 73 (02/24 1735)  Labs: Recent Labs    01/26/24 0539 01/26/24 0732 01/26/24 1309 01/26/24 1643  HGB 10.4*  --   --   --   HCT 32.6*  --   --   --   PLT 219  --   --   --   CREATININE 8.35*  --   --   --   TROPONINIHS 372* 612* 1,080* 1,668*    Estimated Creatinine Clearance: 9 mL/min (A) (by C-G formula based on SCr of 8.35 mg/dL (H)).   Medical History: Past Medical History:  Diagnosis Date   ADHD (attention deficit hyperactivity disorder)    Anemia    Arthritis    Asthma    mild = rarely uses inhaler   Blindness of left eye    retinopathy   Diabetes mellitus without complication (HCC)    type 2   Dyspnea    with exertion   Fibromyalgia    GERD (gastroesophageal reflux disease)    no meds   Hypertension    Lung nodule    1.0 cm LLL lung nodule 07/16/22 CT (evaluation by [facility] Dr. Rosebud Poles)   Osteomyelitis [facility])    RIGHT FOOT FIFTH TOE   Ovarian mass, left 06/26/2021   Renal disorder    ESRD   Sleep apnea    does not use cpap    Medications:  (Not in a hospital admission)   Assessment: 4 YOF admitted with ACS. No prior AC use noted.  Order for heparin gtt received  Goal of Therapy:  Heparin level 0.3-0.7 units/ml Monitor platelets by anticoagulation protocol: Yes   Plan:  Give 4000 units bolus x 1 Start  heparin infusion at 1000 units/hr Check anti-Xa level in 8 hours and daily while on heparin Continue to monitor H&H and platelets   Tashea Othman BS, PharmD, BCPS Clinical Pharmacist 01/26/2024 6:22 PM  Contact: [phone] after 3 PM  "Be curious, not judgmental..." -Debbora Dus

## 2024-01-26 NOTE — ED Notes (Signed)
 Advised patient of her Heparin drip order, she states she cannot have Heparin, she is allergic to it, will notify provider.

## 2024-01-26 NOTE — Consult Note (Signed)
 Cardiology Consultation   Patient ID: Karen [name] MRN: [id number]; DOB: 1976/07/19  Admit date: 01/26/2024 Date of Consult: 01/26/2024  PCP:  Karen Pardon, PA   [facility] Providers Cardiologist:  None     Patient Profile:   Karen [name] is a 48 y.o. 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 Rosalia Hammers.  History of Present Illness:   Karen [name] is a 48 yo female with PMH noted above.  She has never been evaluated by cardiology in the past.  Has a history of bacteremia/possible endocarditis secondary to a dialysis catheter back in 2018.  States there was infection on the tip of the catheter and this was removed and she completed several weeks of IV antibiotics.  In she was on the transplant list and underwent workup which noted a lymph node/mass in her left lung lower lobe.  Underwent VATS with left lower lobe lobectomy in 2021.  Underwent nuclear stress test 03/2023 which showed no significant ischemia/scar small size reversible defect involving apical and apical inferior segments consistent with possible artifact but could not rule out subtle ischemia, LVEF of 54%.  Echocardiogram 05/2023 with LVEF of 55%, grade 1 diastolic dysfunction, mildly dilated left atrium.  Reports she is on a Monday Wednesday Friday schedule for dialysis.  She missed Wednesday in the setting of GI illness with diarrhea.  Presented on Friday, and repeat on Saturday but was only able to sit for an hour session on Saturday in the setting of recurrent diarrhea.  States last evening she developed upper back pain into her shoulders while at rest.  She attempted to use a foam roller with no relief.  Also had a " jolt" type sensation in her chest which was brief in nature.  No significant dyspnea, nausea or vomiting with  this episode.  Ultimately presented to the ER after symptoms did not improve around 4 AM.  Also reports concern for swollen lymph node in the left side of her neck, as well as sore throat and the sensation of foreign body when swallowing.  In the ED labs showed sodium 139, potassium 5.2, creatinine 8.35, high-sensitivity troponin 372>>612, WBC 8.7, hemoglobin 10.4, D-dimer 0.72.  Chest x-ray with mild cardiomegaly, increased linear scarring or atelectasis.  Respiratory panel negative.  EKG showed sinus rhythm, 92 bpm, baseline artifact difficult for interpretation.  CT angio with no PE, three-vessel coronary vascular calcification, small pericardial effusion, mild dilatation of main pulmonary trunk suggestive of pulmonary hypertension.   Past Medical History:  Diagnosis Date   ADHD (attention deficit hyperactivity disorder)    Anemia    Arthritis    Asthma    mild = rarely uses inhaler   Blindness of left eye    retinopathy   Diabetes mellitus without complication (HCC)    type 2   Dyspnea    with exertion   Fibromyalgia    GERD (gastroesophageal reflux disease)    no meds   Hypertension    Lung nodule    1.0 cm LLL lung nodule 07/16/22 CT (evaluation by [facility] Dr. Rosebud Poles)   Osteomyelitis [facility])    RIGHT FOOT FIFTH TOE   Ovarian mass, left 06/26/2021   Renal disorder    ESRD   Sleep apnea    does not use cpap    Past Surgical History:  Procedure Laterality Date   ABDOMINAL AORTOGRAM W/LOWER EXTREMITY N/A 06/08/2018   Procedure: ABDOMINAL AORTOGRAM W/LOWER EXTREMITY;  Surgeon: Maeola Harman, MD;  Location: [facility] INVASIVE CV LAB;  Service: Cardiovascular;  Laterality: N/A;   ABDOMINAL HYSTERECTOMY  09/2016   AMPUTATION Right 06/11/2018   Procedure: AMPUTATION RIGHT FIFTH TOE;  Surgeon: Chuck Hint, MD;  Location: [facility] OR;  Service: Vascular;  Laterality: Right;   APPENDECTOMY     AV FISTULA PLACEMENT Left    CHOLECYSTECTOMY     COLON SURGERY     COLONOSCOPY   2023   cysts removed from top of head  1978   age 67yr   EYE SURGERY Bilateral    retinal - left eye blind   EYE SURGERY Right    cataract removed with lens placement   INSERTION OF DIALYSIS CATHETER N/A 08/06/2022   Procedure: INSERTION OF RIGHT INTERNAL JUGULAR TUNNELED DIALYSIS CATHETER;  Surgeon: Chuck Hint, MD;  Location: [facility] OR;  Service: Vascular;  Laterality: N/A;   reimplantation of uterer     at age 29 yrs   REVISON OF ARTERIOVENOUS FISTULA Left 08/06/2022   Procedure: REVISON OF LEFT ARTERIOVENOUS FISTULA WITH REPAIR OF PSEUDOANEURYSMS AND SUPERFISTULIZATIONS;  Surgeon: Chuck Hint, MD;  Location: [facility] OR;  Service: Vascular;  Laterality: Left;   TRIGGER FINGER RELEASE     right hand ring finger   TUBAL LIGATION     UPPER GI ENDOSCOPY  2017   WISDOM TOOTH EXTRACTION       Inpatient Medications: Scheduled Meds:  amLODipine  10 mg Oral QHS   aspirin  325 mg Oral Daily   cinacalcet  30 mg Oral Q M,W,F   gabapentin  100 mg Oral QHS   gabapentin  200 mg Oral Daily   lanthanum  2,000 mg Oral TID WC   multivitamin  1 tablet Oral q AM   Continuous Infusions:  PRN Meds: albuterol, lanthanum  Allergies:    Allergies  Allergen Reactions   Furosemide Swelling   Heparin Other (See Comments)    Patient relates vitreous hemorrhage after heparin. Patient relates vitreous hemorrhage after heparin.   Insulin Aspart (Human Analog) Swelling    States she takes insulin it just has mild side effects    Social History:   Social History   Socioeconomic History   Marital status: Legally Separated    Spouse name: Not on file   Number of children: Not on file   Years of education: Not on file   Highest education level: Not on file  Occupational History   Not on file  Tobacco Use   Smoking status: Former    Current packs/day: 0.00    Average packs/day: 1 pack/day for 25.0 years (25.0 ttl pk-yrs)    Types: Cigarettes    Start date: 08/03/1995    Quit date:  08/02/2020    Years since quitting: 3.4    Passive exposure: Never   Smokeless tobacco: Never  Vaping Use   Vaping status: Never Used  Substance and Sexual Activity   Alcohol use: Not Currently    Comment: occ   Drug use: Yes    Types: Marijuana    Comment: Last use was on 08/01/22   Sexual activity: Not Currently    Birth control/protection: Surgical    Comment: Hysterectomy  Other Topics Concern   Not on file  Social History Narrative   Not on file   Social Drivers of Health   Financial Resource Strain: Low  Risk  (12/24/2023)   Received from [facility]   Overall Financial Resource Strain (CARDIA)    Difficulty of Paying Living Expenses: Not very hard  Food Insecurity: No Food Insecurity (12/24/2023)   Received from [facility]   Hunger Vital Sign    Worried About Running Out of Food in the Last Year: Never true    Ran Out of Food in the Last Year: Never true  Transportation Needs: No Transportation Needs (12/24/2023)   Received from [facility] - Transportation    Lack of Transportation (Medical): No    Lack of Transportation (Non-Medical): No  Physical Activity: Inactive (08/07/2023)   Received from [facility]   Exercise Vital Sign    Days of Exercise per Week: 0 days    Minutes of Exercise per Session: 10 min  Stress: No Stress Concern Present (08/07/2023)   Received from [facility] - Occupational Stress Questionnaire    Feeling of Stress : Not at all  Social Connections: Socially Integrated (08/07/2023)   Received from [facility]   Social Network    How would you rate your social network (family, work, friends)?: Good participation with social networks  Intimate Partner Violence: Not At Risk (08/07/2023)   Received from [facility]   HITS    Over the last 12 months how often did your partner physically hurt you?: Never    Over the last 12 months how often did your partner insult you or talk down to  you?: Never    Over the last 12 months how often did your partner threaten you with physical harm?: Never    Over the last 12 months how often did your partner scream or curse at you?: Never    Family History:    Family History  Problem Relation Age of Onset   Breast cancer Mother    Diabetes Father    Breast cancer Maternal Grandmother    Stomach cancer Maternal Grandmother    Diabetes Paternal Grandmother    Breast cancer Maternal Aunt    COPD Maternal Aunt    COPD Maternal Aunt    Heart failure Maternal Aunt    COPD Maternal Aunt    Kidney cancer Maternal Aunt      ROS:  Please see the history of present illness.   All other ROS reviewed and negative.     Physical Exam/Data:   Vitals:   01/26/24 0800 01/26/24 0831 01/26/24 0900 01/26/24 1105  BP: 134/72  124/74 (!) 123/53  Pulse: 80  80 81  Resp: 15  (!) 21 18  Temp:   98 F (36.7 C) 98.1 F (36.7 C)  SpO2: 99%  (!) 88% 100%  Weight:  86.2 kg    Height:  5\' 5"  (1.651 m)     No intake or output data in the 24 hours ending 01/26/24 1157    01/26/2024    8:31 AM 01/26/2024    5:30 AM 09/30/2022   12:24 PM  Last 3 Weights  Weight (lbs) 190 lb 190 lb 11.2 oz 190 lb 11.2 oz  Weight (kg) 86.183 kg 86.5 kg 86.5 kg     Body mass index is 31.62 kg/m.   Physical Exam per MD  EKG:  The EKG was personally reviewed and demonstrates:  Sinus Rhythm, 92 bpm baseline artifact  Relevant CV Studies:  Stress Myoview Echocardiogram  Laboratory Data:  High Sensitivity Troponin:   Recent Labs  Lab  01/26/24 0539 01/26/24 0732  TROPONINIHS 372* 612*     Chemistry Recent Labs  Lab 01/26/24 0539  NA 139  K 5.2*  CL 103  CO2 21*  GLUCOSE 128*  BUN 57*  CREATININE 8.35*  CALCIUM 9.2  GFRNONAA 5*  ANIONGAP 15    No results for input(s): "PROT", "ALBUMIN", "AST", "ALT", "ALKPHOS", "BILITOT" in the last 168 hours. Lipids No results for input(s): "CHOL", "TRIG", "HDL", "LABVLDL", "LDLCALC", "CHOLHDL" in the last  168 hours.  Hematology Recent Labs  Lab 01/26/24 0539  WBC 8.7  RBC 3.37*  HGB 10.4*  HCT 32.6*  MCV 96.7  MCH 30.9  MCHC 31.9  RDW 13.2  PLT 219   Thyroid No results for input(s): "TSH", "FREET4" in the last 168 hours.  BNPNo results for input(s): "BNP", "PROBNP" in the last 168 hours.  DDimer  Recent Labs  Lab 01/26/24 0744  DDIMER 0.72*     Radiology/Studies:  CT Angio Chest PE W/Cm &/Or Wo Cm Result Date: 01/26/2024 CLINICAL DATA:  Positive D-dimer. Cough and sore throat. Concern for pulmonary embolism. EXAM: CT ANGIOGRAPHY CHEST WITH CONTRAST TECHNIQUE: Multidetector CT imaging of the chest was performed using the standard protocol during bolus administration of intravenous contrast. Multiplanar CT image reconstructions and MIPs were obtained to evaluate the vascular anatomy. RADIATION DOSE REDUCTION: This exam was performed according to the departmental dose-optimization program which includes automated exposure control, adjustment of the mA and/or kV according to patient size and/or use of iterative reconstruction technique. CONTRAST:  75mL OMNIPAQUE IOHEXOL 350 MG/ML SOLN COMPARISON:  Chest radiograph dated 01/26/2024. FINDINGS: Cardiovascular: There is no cardiomegaly. There is 3 vessel coronary vascular calcification. Small pericardial effusion. Mild atherosclerotic calcification of the thoracic aorta. No aneurysmal dilatation or dissection. The origins of the great vessels of the aortic arch appear patent. There is mild dilatation of the main pulmonary trunk suggestive of pulmonary hypertension. Evaluation of the pulmonary arteries is limited due to suboptimal opacification and timing of the contrast. No central pulmonary artery embolus identified. Mediastinum/Nodes: No hilar adenopathy. The esophagus is grossly unremarkable. No mediastinal fluid collection. Lungs/Pleura: Left lung base scarring and postsurgical changes. Rounded subpleural consolidation in the superior segment  of the left lower lobe likely an area of round atelectasis. Pneumonia or underlying mass is not excluded. Direct comparison with prior CT images, if available, or follow-up recommended to document stability/resolution. No pleural effusion or pneumothorax. The central airways are patent. Upper Abdomen: No acute abnormality. Musculoskeletal: Renal osteodystrophy.  No acute osseous pathology. Review of the MIP images confirms the above findings. IMPRESSION: 1. No central pulmonary artery embolus identified. 2. Left lung base scarring and postsurgical changes. Rounded subpleural consolidation in the superior segment of the left lower lobe likely an area of round atelectasis. Pneumonia or underlying mass is not excluded. Direct comparison with prior CT images, if available, or follow-up recommended to document stability/resolution. 3.  Aortic Atherosclerosis (ICD10-I70.0). Electronically Signed   By: Elgie Collard M.D.   On: 01/26/2024 11:19   DG Chest 2 View Result Date: 01/26/2024 CLINICAL DATA:  Chest pain, back pain and sore throat. EXAM: CHEST - 2 VIEW COMPARISON:  Portable chest 08/06/2022 FINDINGS: There is mild cardiomegaly but the heart is less enlarged than previously. Mild central vascular prominence is present but the interstitial edema noted previously is not seen today. The mediastinum is normally outlined. The lungs are clear of infiltrates. There is increased linear scarring or atelectasis in the left base. There is no substantial pleural  effusion.  Thoracic cage is intact. IMPRESSION: 1. Mild cardiomegaly and central vascular prominence. The heart is less enlarged than previously. No overt edema. 2. Increased linear scarring or atelectasis in the left base. No focal pneumonic infiltrate. Electronically Signed   By: Almira Bar M.D.   On: 01/26/2024 06:14     Assessment and Plan:   Karen [name] is a 48 y.o. 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 Rosalia Hammers.  NSTEMI (possibly type II in the setting of GI illness) Back pain -- Presented with pain across her upper back along with GI illness and sore throat.  Reported a " jolt like" sensation in her anterior chest. -- hsTn 372>>612, EKG with no acute ST/T wave abnormalities -- Symptoms are somewhat atypical as she does report pain seems to be worse with movement. -- Noted above underwent stress Myoview 2024 which was negative for ischemia/scar small size reversible defect involving apical and apical inferior segments consistent with possible artifact but could not rule out subtle ischemia, LVEF of 54%. -- With her baseline CKD, unclear significance of troponin elevation at this time -- CT angio chest negative for PE but did note three-vessel coronary calcification -- Check echocardiogram, other recommendations based on findings  GI/viral illness -- Reported episodes of diarrhea on Wednesday and Saturday/Sunday.  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  For questions or updates, please contact [facility] Please consult [email or web address] for contact info under    Signed, Laverda Page, NP  01/26/2024 11:57 AM

## 2024-01-27 ENCOUNTER — Encounter (HOSPITAL_COMMUNITY)
Admission: EM | Disposition: A | Discharge status: Home / Self Care | Payer: Self-pay | Source: Home / Self Care | Attending: Internal Medicine

## 2024-01-27 DIAGNOSIS — G8929 Other chronic pain: Secondary | ICD-10-CM

## 2024-01-27 DIAGNOSIS — I214 Non-ST elevation (NSTEMI) myocardial infarction: Secondary | ICD-10-CM

## 2024-01-27 DIAGNOSIS — A0839 Other viral enteritis: Secondary | ICD-10-CM | POA: Diagnosis present

## 2024-01-27 DIAGNOSIS — E785 Hyperlipidemia, unspecified: Secondary | ICD-10-CM | POA: Diagnosis present

## 2024-01-27 DIAGNOSIS — I3139 Other pericardial effusion (noninflammatory): Secondary | ICD-10-CM | POA: Diagnosis present

## 2024-01-27 DIAGNOSIS — Z833 Family history of diabetes mellitus: Secondary | ICD-10-CM | POA: Diagnosis not present

## 2024-01-27 DIAGNOSIS — Z1152 Encounter for screening for COVID-19: Secondary | ICD-10-CM | POA: Diagnosis not present

## 2024-01-27 DIAGNOSIS — I420 Dilated cardiomyopathy: Secondary | ICD-10-CM

## 2024-01-27 DIAGNOSIS — Z79899 Other long term (current) drug therapy: Secondary | ICD-10-CM | POA: Diagnosis not present

## 2024-01-27 DIAGNOSIS — J4520 Mild intermittent asthma, uncomplicated: Secondary | ICD-10-CM | POA: Diagnosis present

## 2024-01-27 DIAGNOSIS — Z794 Long term (current) use of insulin: Secondary | ICD-10-CM | POA: Diagnosis not present

## 2024-01-27 DIAGNOSIS — I132 Hypertensive heart and chronic kidney disease with heart failure and with stage 5 chronic kidney disease, or end stage renal disease: Secondary | ICD-10-CM | POA: Diagnosis present

## 2024-01-27 DIAGNOSIS — Z9641 Presence of insulin pump (external) (internal): Secondary | ICD-10-CM | POA: Diagnosis present

## 2024-01-27 DIAGNOSIS — Z8249 Family history of ischemic heart disease and other diseases of the circulatory system: Secondary | ICD-10-CM | POA: Diagnosis not present

## 2024-01-27 DIAGNOSIS — N186 End stage renal disease: Secondary | ICD-10-CM | POA: Diagnosis present

## 2024-01-27 DIAGNOSIS — M546 Pain in thoracic spine: Secondary | ICD-10-CM | POA: Diagnosis not present

## 2024-01-27 DIAGNOSIS — M797 Fibromyalgia: Secondary | ICD-10-CM | POA: Diagnosis present

## 2024-01-27 DIAGNOSIS — I2510 Atherosclerotic heart disease of native coronary artery without angina pectoris: Secondary | ICD-10-CM

## 2024-01-27 DIAGNOSIS — Z993 Dependence on wheelchair: Secondary | ICD-10-CM | POA: Diagnosis not present

## 2024-01-27 DIAGNOSIS — I5022 Chronic systolic (congestive) heart failure: Secondary | ICD-10-CM | POA: Diagnosis present

## 2024-01-27 DIAGNOSIS — E1122 Type 2 diabetes mellitus with diabetic chronic kidney disease: Secondary | ICD-10-CM | POA: Diagnosis present

## 2024-01-27 DIAGNOSIS — E875 Hyperkalemia: Secondary | ICD-10-CM | POA: Diagnosis present

## 2024-01-27 DIAGNOSIS — Z992 Dependence on renal dialysis: Secondary | ICD-10-CM | POA: Diagnosis not present

## 2024-01-27 DIAGNOSIS — E11319 Type 2 diabetes mellitus with unspecified diabetic retinopathy without macular edema: Secondary | ICD-10-CM | POA: Diagnosis present

## 2024-01-27 DIAGNOSIS — N2581 Secondary hyperparathyroidism of renal origin: Secondary | ICD-10-CM | POA: Diagnosis present

## 2024-01-27 DIAGNOSIS — Z87891 Personal history of nicotine dependence: Secondary | ICD-10-CM | POA: Diagnosis not present

## 2024-01-27 DIAGNOSIS — R0789 Other chest pain: Secondary | ICD-10-CM | POA: Diagnosis present

## 2024-01-27 HISTORY — PX: CORONARY STENT INTERVENTION: CATH118234

## 2024-01-27 HISTORY — PX: LEFT HEART CATH AND CORONARY ANGIOGRAPHY: CATH118249

## 2024-01-27 HISTORY — PX: CORONARY ATHERECTOMY: CATH118238

## 2024-01-27 LAB — I-STAT CHEM 8, ED
BUN: 70 mg/dL — ABNORMAL HIGH (ref 6–20)
Calcium, Ion: 1.1 mmol/L — ABNORMAL LOW (ref 1.15–1.40)
Chloride: 102 mmol/L (ref 98–111)
Creatinine, Ser: 10.5 mg/dL — ABNORMAL HIGH (ref 0.44–1.00)
Glucose, Bld: 119 mg/dL — ABNORMAL HIGH (ref 70–99)
HCT: 27 % — ABNORMAL LOW (ref 36.0–46.0)
Hemoglobin: 9.2 g/dL — ABNORMAL LOW (ref 12.0–15.0)
Potassium: 6.5 mmol/L (ref 3.5–5.1)
Sodium: 133 mmol/L — ABNORMAL LOW (ref 135–145)
TCO2: 23 mmol/L (ref 22–32)

## 2024-01-27 LAB — CBC
HCT: 28.7 % — ABNORMAL LOW (ref 36.0–46.0)
Hemoglobin: 9.2 g/dL — ABNORMAL LOW (ref 12.0–15.0)
MCH: 30.9 pg (ref 26.0–34.0)
MCHC: 32.1 g/dL (ref 30.0–36.0)
MCV: 96.3 fL (ref 80.0–100.0)
Platelets: 188 10*3/uL (ref 150–400)
RBC: 2.98 MIL/uL — ABNORMAL LOW (ref 3.87–5.11)
RDW: 13.2 % (ref 11.5–15.5)
WBC: 9.5 10*3/uL (ref 4.0–10.5)
nRBC: 0 % (ref 0.0–0.2)

## 2024-01-27 LAB — COMPREHENSIVE METABOLIC PANEL
ALT: 23 U/L (ref 0–44)
AST: 26 U/L (ref 15–41)
Albumin: 2.8 g/dL — ABNORMAL LOW (ref 3.5–5.0)
Alkaline Phosphatase: 186 U/L — ABNORMAL HIGH (ref 38–126)
Anion gap: 14 (ref 5–15)
BUN: 68 mg/dL — ABNORMAL HIGH (ref 6–20)
CO2: 22 mmol/L (ref 22–32)
Calcium: 8.5 mg/dL — ABNORMAL LOW (ref 8.9–10.3)
Chloride: 97 mmol/L — ABNORMAL LOW (ref 98–111)
Creatinine, Ser: 9.9 mg/dL — ABNORMAL HIGH (ref 0.44–1.00)
GFR, Estimated: 4 mL/min — ABNORMAL LOW (ref >60)
Glucose, Bld: 121 mg/dL — ABNORMAL HIGH (ref 70–99)
Potassium: 6.8 mmol/L (ref 3.5–5.1)
Sodium: 133 mmol/L — ABNORMAL LOW (ref 135–145)
Total Bilirubin: 0.8 mg/dL (ref 0.0–1.2)
Total Protein: 5.7 g/dL — ABNORMAL LOW (ref 6.5–8.1)

## 2024-01-27 LAB — GLUCOSE, CAPILLARY
Glucose-Capillary: 214 mg/dL — ABNORMAL HIGH (ref 70–99)
Glucose-Capillary: 179 mg/dL — ABNORMAL HIGH (ref 70–99)

## 2024-01-27 LAB — TROPONIN I (HIGH SENSITIVITY)
Troponin I (High Sensitivity): 3118 ng/L (ref <18)
Troponin I (High Sensitivity): 3167 ng/L (ref <18)
Troponin I (High Sensitivity): 3247 ng/L (ref <18)

## 2024-01-27 LAB — RENAL FUNCTION PANEL
Albumin: 2.8 g/dL — ABNORMAL LOW (ref 3.5–5.0)
Albumin: 3 g/dL — ABNORMAL LOW (ref 3.5–5.0)
Anion gap: 12 (ref 5–15)
Anion gap: 12 (ref 5–15)
BUN: 67 mg/dL — ABNORMAL HIGH (ref 6–20)
BUN: 38 mg/dL — ABNORMAL HIGH (ref 6–20)
CO2: 22 mmol/L (ref 22–32)
CO2: 25 mmol/L (ref 22–32)
Calcium: 8.3 mg/dL — ABNORMAL LOW (ref 8.9–10.3)
Calcium: 8.8 mg/dL — ABNORMAL LOW (ref 8.9–10.3)
Chloride: 101 mmol/L (ref 98–111)
Chloride: 93 mmol/L — ABNORMAL LOW (ref 98–111)
Creatinine, Ser: 9.66 mg/dL — ABNORMAL HIGH (ref 0.44–1.00)
Creatinine, Ser: 5.99 mg/dL — ABNORMAL HIGH (ref 0.44–1.00)
GFR, Estimated: 5 mL/min — ABNORMAL LOW (ref >60)
GFR, Estimated: 8 mL/min — ABNORMAL LOW (ref >60)
Glucose, Bld: 130 mg/dL — ABNORMAL HIGH (ref 70–99)
Glucose, Bld: 162 mg/dL — ABNORMAL HIGH (ref 70–99)
Phosphorus: 7.1 mg/dL — ABNORMAL HIGH (ref 2.5–4.6)
Phosphorus: 5.6 mg/dL — ABNORMAL HIGH (ref 2.5–4.6)
Potassium: 6.6 mmol/L (ref 3.5–5.1)
Potassium: 5.7 mmol/L — ABNORMAL HIGH (ref 3.5–5.1)
Sodium: 135 mmol/L (ref 135–145)
Sodium: 130 mmol/L — ABNORMAL LOW (ref 135–145)

## 2024-01-27 LAB — POCT ACTIVATED CLOTTING TIME
Activated Clotting Time: 199 s
Activated Clotting Time: 187 s
Activated Clotting Time: 158 s

## 2024-01-27 LAB — POTASSIUM: Potassium: 5 mmol/L (ref 3.5–5.1)

## 2024-01-27 LAB — CBG MONITORING, ED
Glucose-Capillary: 139 mg/dL — ABNORMAL HIGH (ref 70–99)
Glucose-Capillary: 159 mg/dL — ABNORMAL HIGH (ref 70–99)

## 2024-01-27 LAB — HEPATITIS B SURFACE ANTIBODY, QUANTITATIVE: Hep B S AB Quant (Post): 24.9 m[IU]/mL

## 2024-01-27 LAB — HEPARIN LEVEL (UNFRACTIONATED): Heparin Unfractionated: 0.25 [IU]/mL — ABNORMAL LOW (ref 0.30–0.70)

## 2024-01-27 SURGERY — LEFT HEART CATH AND CORONARY ANGIOGRAPHY
Anesthesia: LOCAL

## 2024-01-27 MED ORDER — MIDAZOLAM HCL 2 MG/2ML IJ SOLN
INTRAMUSCULAR | Status: DC | PRN
  Administered 2024-01-27: 1 mg via INTRAVENOUS
  Administered 2024-01-27: 2 mg via INTRAVENOUS

## 2024-01-27 MED ORDER — MIDODRINE HCL 5 MG PO TABS: 10.0000 mg | ORAL_TABLET | Freq: Once | ORAL | Status: DC | PRN

## 2024-01-27 MED ORDER — DEXTROSE 50 % IV SOLN
1.0000 | Freq: Once | INTRAVENOUS | Status: AC
  Administered 2024-01-27: 50 mL via INTRAVENOUS
  Filled 2024-01-27: qty 50

## 2024-01-27 MED ORDER — HEPARIN (PORCINE) IN NACL 1000-0.9 UT/500ML-% IV SOLN
INTRAVENOUS | Status: DC | PRN
  Administered 2024-01-27 (×2): 500 mL

## 2024-01-27 MED ORDER — SODIUM ZIRCONIUM CYCLOSILICATE 10 G PO PACK
10.0000 g | PACK | Freq: Every day | ORAL | Status: DC
  Administered 2024-01-27 – 2024-01-28 (×2): 10 g via ORAL
  Filled 2024-01-27 (×2): qty 1

## 2024-01-27 MED ORDER — NITROGLYCERIN 1 MG/10 ML FOR IR/CATH LAB
INTRA_ARTERIAL | Status: DC | PRN
  Administered 2024-01-27 (×2): 200 ug via INTRACORONARY

## 2024-01-27 MED ORDER — INSULIN ASPART 100 UNIT/ML IJ SOLN
0.0000 [IU] | Freq: Three times a day (TID) | INTRAMUSCULAR | Status: DC
  Administered 2024-01-27: 2 [IU] via SUBCUTANEOUS

## 2024-01-27 MED ORDER — LABETALOL HCL 5 MG/ML IV SOLN
INTRAVENOUS | Status: AC
  Administered 2024-01-27: 10 mg via INTRAVENOUS
  Filled 2024-01-27: qty 4

## 2024-01-27 MED ORDER — ALTEPLASE 2 MG IJ SOLR: 2.0000 mg | Freq: Once | INTRAMUSCULAR | Status: DC | PRN

## 2024-01-27 MED ORDER — TICAGRELOR 90 MG PO TABS
90.0000 mg | ORAL_TABLET | Freq: Two times a day (BID) | ORAL | Status: DC
  Administered 2024-01-27 – 2024-01-28 (×2): 90 mg via ORAL
  Filled 2024-01-27 (×2): qty 1

## 2024-01-27 MED ORDER — LIDOCAINE HCL (PF) 1 % IJ SOLN: 5.0000 mL | INTRAMUSCULAR | Status: DC | PRN

## 2024-01-27 MED ORDER — SODIUM CHLORIDE 0.9% FLUSH
3.0000 mL | Freq: Two times a day (BID) | INTRAVENOUS | Status: DC
  Administered 2024-01-27: 3 mL via INTRAVENOUS

## 2024-01-27 MED ORDER — LIDOCAINE HCL (PF) 1 % IJ SOLN
INTRAMUSCULAR | Status: AC
  Filled 2024-01-27: qty 30

## 2024-01-27 MED ORDER — IOHEXOL 350 MG/ML SOLN
INTRAVENOUS | Status: DC | PRN
  Administered 2024-01-27: 185 mL

## 2024-01-27 MED ORDER — LIDOCAINE-PRILOCAINE 2.5-2.5 % EX CREA: 1.0000 | TOPICAL_CREAM | CUTANEOUS | Status: DC | PRN

## 2024-01-27 MED ORDER — HEPARIN SODIUM (PORCINE) 1000 UNIT/ML IJ SOLN
INTRAMUSCULAR | Status: DC | PRN
  Administered 2024-01-27: 2000 [IU] via INTRAVENOUS
  Administered 2024-01-27: 10000 [IU] via INTRAVENOUS

## 2024-01-27 MED ORDER — MIDAZOLAM HCL 2 MG/2ML IJ SOLN
INTRAMUSCULAR | Status: AC
  Filled 2024-01-27: qty 2

## 2024-01-27 MED ORDER — PENTAFLUOROPROP-TETRAFLUOROETH EX AERO: 1.0000 | INHALATION_SPRAY | CUTANEOUS | Status: DC | PRN

## 2024-01-27 MED ORDER — FENTANYL CITRATE (PF) 100 MCG/2ML IJ SOLN
INTRAMUSCULAR | Status: DC | PRN
  Administered 2024-01-27: 25 ug via INTRAVENOUS

## 2024-01-27 MED ORDER — HYDRALAZINE HCL 20 MG/ML IJ SOLN: 10.0000 mg | INTRAMUSCULAR | Status: AC | PRN

## 2024-01-27 MED ORDER — LABETALOL HCL 5 MG/ML IV SOLN: 10.0000 mg | INTRAVENOUS | Status: AC | PRN

## 2024-01-27 MED ORDER — ALBUTEROL SULFATE (2.5 MG/3ML) 0.083% IN NEBU
10.0000 mg | INHALATION_SOLUTION | Freq: Once | RESPIRATORY_TRACT | Status: AC
  Administered 2024-01-27: 10 mg via RESPIRATORY_TRACT
  Filled 2024-01-27: qty 12

## 2024-01-27 MED ORDER — HYDROMORPHONE HCL 1 MG/ML IJ SOLN
0.5000 mg | Freq: Once | INTRAMUSCULAR | Status: AC
  Administered 2024-01-27: 0.5 mg via INTRAVENOUS
  Filled 2024-01-27: qty 1

## 2024-01-27 MED ORDER — CALCIUM GLUCONATE-NACL 1-0.675 GM/50ML-% IV SOLN
1.0000 g | Freq: Once | INTRAVENOUS | Status: AC
  Administered 2024-01-27: 1000 mg via INTRAVENOUS
  Filled 2024-01-27: qty 50

## 2024-01-27 MED ORDER — LIDOCAINE HCL (PF) 1 % IJ SOLN
INTRAMUSCULAR | Status: DC | PRN
Start: 2024-01-27 — End: 2024-01-27
  Administered 2024-01-27: 10 mL via INTRADERMAL

## 2024-01-27 MED ORDER — ANTICOAGULANT SODIUM CITRATE 4% (200MG/5ML) IV SOLN: 5.0000 mL | Status: DC | PRN

## 2024-01-27 MED ORDER — METOPROLOL TARTRATE 12.5 MG HALF TABLET
12.5000 mg | ORAL_TABLET | Freq: Two times a day (BID) | ORAL | Status: DC
  Administered 2024-01-27: 12.5 mg via ORAL
  Filled 2024-01-27 (×2): qty 1

## 2024-01-27 MED ORDER — INSULIN ASPART 100 UNIT/ML IJ SOLN
INTRAMUSCULAR | Status: AC
  Filled 2024-01-27: qty 1

## 2024-01-27 MED ORDER — HYDROMORPHONE HCL 1 MG/ML IJ SOLN
0.5000 mg | Freq: Once | INTRAMUSCULAR | Status: AC
  Administered 2024-01-27: 0.5 mg via INTRAVENOUS
  Filled 2024-01-27: qty 0.5

## 2024-01-27 MED ORDER — SODIUM CHLORIDE 0.9% FLUSH
3.0000 mL | INTRAVENOUS | Status: DC | PRN
  Administered 2024-01-27: 3 mL via INTRAVENOUS

## 2024-01-27 MED ORDER — INSULIN ASPART 100 UNIT/ML IV SOLN
5.0000 [IU] | Freq: Once | INTRAVENOUS | Status: AC
  Administered 2024-01-27: 5 [IU] via INTRAVENOUS

## 2024-01-27 MED ORDER — NITROGLYCERIN 1 MG/10 ML FOR IR/CATH LAB
INTRA_ARTERIAL | Status: AC
  Filled 2024-01-27: qty 10

## 2024-01-27 MED ORDER — SODIUM CHLORIDE 0.9 % IV SOLN: 250.0000 mL | INTRAVENOUS | Status: DC | PRN

## 2024-01-27 MED ORDER — INSULIN ASPART 100 UNIT/ML IJ SOLN: 0.0000 [IU] | Freq: Every day | INTRAMUSCULAR | Status: DC

## 2024-01-27 MED ORDER — FENTANYL CITRATE (PF) 100 MCG/2ML IJ SOLN
INTRAMUSCULAR | Status: DC | PRN
  Administered 2024-01-27: 50 ug via INTRAVENOUS
  Administered 2024-01-27: 25 ug via INTRAVENOUS

## 2024-01-27 MED ORDER — FENTANYL CITRATE (PF) 100 MCG/2ML IJ SOLN
INTRAMUSCULAR | Status: AC
  Filled 2024-01-27: qty 2

## 2024-01-27 MED ORDER — TICAGRELOR 90 MG PO TABS
ORAL_TABLET | ORAL | Status: DC | PRN
  Administered 2024-01-27: 180 mg via ORAL

## 2024-01-27 MED ORDER — HEPARIN SODIUM (PORCINE) 1000 UNIT/ML IJ SOLN
INTRAMUSCULAR | Status: AC
  Filled 2024-01-27: qty 10

## 2024-01-27 SURGICAL SUPPLY — 25 items
BALL SAPPHIRE NC24 2.75X12 (BALLOONS) ×1 IMPLANT
BALL SAPPHIRE NC24 3.25X12 (BALLOONS) ×1 IMPLANT
BALLN SAPPHIRE 2.5X15 (BALLOONS) ×1 IMPLANT
BALLOON SAPPHIRE 2.5X15 (BALLOONS) IMPLANT
BALLOON SAPPHIRE NC24 2.75X12 (BALLOONS) IMPLANT
BALLOON SAPPHIRE NC24 3.25X12 (BALLOONS) IMPLANT
CATH 5FR JL3.5 JR4 ANG PIG MP (CATHETERS) IMPLANT
CATH VISTA GUIDE 6FR XBLD 3.5 (CATHETERS) IMPLANT
CROWN DIAMONDBACK CLASSIC 1.25 (BURR) IMPLANT
GLIDESHEATH SLEND SS 6F .021 (SHEATH) IMPLANT
KIT ENCORE 26 ADVANTAGE (KITS) IMPLANT
LUBRICANT VIPERSLIDE CORONARY (MISCELLANEOUS) IMPLANT
PACK CARDIAC CATHETERIZATION (CUSTOM PROCEDURE TRAY) ×1 IMPLANT
SET ATX-X65L (MISCELLANEOUS) IMPLANT
SHEATH PINNACLE 5F 10CM (SHEATH) IMPLANT
SHEATH PINNACLE 6F 10CM (SHEATH) IMPLANT
SHEATH PROBE COVER 6X72 (BAG) IMPLANT
STENT SYNERGY XD 2.50X24 (Permanent Stent) IMPLANT
SYNERGY XD 2.50X24 (Permanent Stent) ×1 IMPLANT
TRANSDUCER W/STOPCOCK (MISCELLANEOUS) IMPLANT
TUBING ART PRESS 72 MALE/FEM (TUBING) IMPLANT
WIRE ASAHI PROWATER 180CM (WIRE) IMPLANT
WIRE EMERALD 3MM-J .035X150CM (WIRE) IMPLANT
WIRE MICRO SET SILHO 5FR 7 (SHEATH) IMPLANT
WIRE VIPERWIRE COR FLEX .012 (WIRE) IMPLANT

## 2024-01-27 NOTE — Interval H&P Note (Signed)
 History and Physical Interval Note:  01/27/2024 2:00 PM  [facility]  has presented today for surgery, with the diagnosis of non stemi.  The various methods of treatment have been discussed with the patient and family. After consideration of risks, benefits and other options for treatment, the patient has consented to  Procedure(s): LEFT HEART CATH AND CORONARY ANGIOGRAPHY (N/A) as a surgical intervention.  The patient's history has been reviewed, patient examined, no change in status, stable for surgery.  I have reviewed the patient's chart and labs.  Questions were answered to the patient's satisfaction.    Cath Lab Visit (complete for each Cath Lab visit)  Clinical Evaluation Leading to the Procedure:   ACS: Yes.    Non-ACS:    Anginal Classification: CCS III  Anti-ischemic medical therapy: Minimal Therapy (1 class of medications)  Non-Invasive Test Results: No non-invasive testing performed  Prior CABG: No previous CABG    Verne Carrow

## 2024-01-27 NOTE — Progress Notes (Signed)
 Transition of Care [facility]) - Inpatient Brief Assessment   Patient Details  Name: Karen [name] MRN: [id number] Date of Birth: [birth year]  Transition of [facility]) CM/SW Contact:    Oletta Cohn, RN Phone Number: 01/27/2024, 8:34 AM   Clinical Narrative: RNCM met with pt at bedside regarding discharge plan.  Pt has DME (rollator and wheelchair) to aid with mobility.  Pt goes to [facility] in [facility] (MWF).  Plan to return home with family.  Transition of Care Asessment: Insurance and Status: (P) Insurance coverage has been reviewed Patient has primary care physician: (P) Yes Home environment has been reviewed: (P) from home with mom and sister Prior level of function:: (P) depends on sister to help with ADLs Prior/Current Home Services: (P) No current home services Social Drivers of Health Review: (P) SDOH reviewed no interventions necessary Readmission risk has been reviewed: (P) Yes Transition of care needs: (P) transition of care needs identified, TOC will continue to follow

## 2024-01-27 NOTE — Progress Notes (Addendum)
 [facility] Progress Note   Subjective: In HD still on ED stretcher. Still having pain in between shoulder blades. Troponin now in 3K range. Standing weight 93.9 kg! UF as tolerated but will most likely need serial HD to remove volume.   Objective Vitals:   01/27/24 0530 01/27/24 0600 01/27/24 0630 01/27/24 0639  BP: 128/68 126/69 114/60   Pulse: 77 88 86   Resp: 11 12 12    Temp:    98.6 F (37 C)  TempSrc:    Oral  SpO2: 100% 100% 100%   Weight:      Height:       General: Chronically ill appearing female in no acute distress. Lungs: Clear bilaterally to auscultation without wheezes, rales, or rhonchi. Breathing is unlabored. Heart: S1,S2 2/6 systolic M.  Abdomen: NABS, obese.  Lower extremities: Trace pre tib edema BLE Neuro: Alert and oriented X 3. Moves all extremities spontaneously. Dialysis Access: L AVF + T/B:  Additional Objective Labs: Basic Metabolic Panel: Recent Labs  Lab 01/26/24 0539 01/27/24 0245 01/27/24 0415 01/27/24 0420 01/27/24 0635  NA 139 133* 135 133*  --   K 5.2* 6.8* 6.6* 6.5* 5.0  CL 103 97* 101 102  --   CO2 21* 22 22  --   --   GLUCOSE 128* 121* 130* 119*  --   BUN 57* 68* 67* 70*  --   CREATININE 8.35* 9.90* 9.66* 10.50*  --   CALCIUM 9.2 8.5* 8.3*  --   --   PHOS  --   --  7.1*  --   --    Liver Function Tests: Recent Labs  Lab 01/27/24 0245 01/27/24 0415  AST 26  --   ALT 23  --   ALKPHOS 186*  --   BILITOT 0.8  --   PROT 5.7*  --   ALBUMIN 2.8* 2.8*   No results for input(s): "LIPASE", "AMYLASE" in the last 168 hours. CBC: Recent Labs  Lab 01/26/24 0539 01/27/24 0420  WBC 8.7  --   HGB 10.4* 9.2*  HCT 32.6* 27.0*  MCV 96.7  --   PLT 219  --    Blood Culture    Component Value Date/Time   SDES BLOOD RIGHT ARM 12/10/2021 2127   SPECREQUEST  12/10/2021 2127    BOTTLES DRAWN AEROBIC AND ANAEROBIC Blood Culture results may not be optimal due to an excessive volume of blood received in culture  bottles   CULT  12/10/2021 2127    NO GROWTH 5 DAYS Performed at [facility], [address], [place], [place] [address]    REPTSTATUS 12/15/2021 FINAL 12/10/2021 2127    Cardiac Enzymes: No results for input(s): "CKTOTAL", "CKMB", "CKMBINDEX", "TROPONINI" in the last 168 hours. CBG: Recent Labs  Lab 01/27/24 0502 01/27/24 0540  GLUCAP 139* 159*   Iron Studies: No results for input(s): "IRON", "TIBC", "TRANSFERRIN", "FERRITIN" in the last 72 hours. @lablastinr3 @ Studies/Results: ECHOCARDIOGRAM COMPLETE Result Date: 01/26/2024    ECHOCARDIOGRAM REPORT   Patient Name:   [facility] Date of Exam: 01/26/2024 Medical Rec #:  [id number]             Height:       65.0 in Accession #:    [id number]            Weight:       190.0 lb Date of Birth:  02-Jun-1976             BSA:  1.935 m Patient Age:    48 years              BP:           104/52 mmHg Patient Gender: F                     HR:           74 bpm. Exam Location:  Inpatient Procedure: 2D Echo and 3D Echo (Both Spectral and Color Flow Doppler were            utilized during procedure). Indications:    Chest pain  History:        Patient has no prior history of Echocardiogram examinations.                 ESRD, Signs/Symptoms:Chest Pain; Risk Factors:Hypertension,                 Diabetes, Sleep Apnea and Former Smoker. H/o endocarditis.  Sonographer:    Dondra Prader RVT RCS Referring Phys: EMILY DEAN IMPRESSIONS  1. Left ventricular ejection fraction, by estimation, is 40 to 45%. The left ventricle has mildly decreased function. The left ventricle demonstrates regional wall motion abnormalities (see scoring diagram/findings for description). There is mild concentric left ventricular hypertrophy. Left ventricular diastolic parameters are consistent with Grade I diastolic dysfunction (impaired relaxation).  2. Right ventricular systolic function is normal. The right ventricular size is normal.  3. Left atrial size was mildly  dilated.  4. The mitral valve is degenerative. Mild mitral valve regurgitation. No evidence of mitral stenosis.  5. The aortic valve is normal in structure. There is moderate calcification of the aortic valve. Aortic valve regurgitation is trivial. Aortic valve sclerosis/calcification is present, without any evidence of aortic stenosis. Aortic valve area, by VTI measures 2.08 cm. Aortic valve mean gradient measures 5.5 mmHg. Aortic valve Vmax measures 1.63 m/s.  6. The inferior vena cava is dilated in size with >50% respiratory variability, suggesting right atrial pressure of 8 mmHg. FINDINGS  Left Ventricle: Left ventricular ejection fraction, by estimation, is 40 to 45%. The left ventricle has mildly decreased function. The left ventricle demonstrates regional wall motion abnormalities. Strain imaging was not performed. The left ventricular  internal cavity size was normal in size. There is mild concentric left ventricular hypertrophy. Left ventricular diastolic parameters are consistent with Grade I diastolic dysfunction (impaired relaxation).  LV Wall Scoring: The mid inferoseptal segment, apical septal segment, and apical inferior segment are akinetic. The apex is hypokinetic. Right Ventricle: The right ventricular size is normal. No increase in right ventricular wall thickness. Right ventricular systolic function is normal. Left Atrium: Left atrial size was mildly dilated. Right Atrium: Right atrial size was normal in size. Pericardium: There is no evidence of pericardial effusion. Mitral Valve: The mitral valve is degenerative in appearance. Mild mitral valve regurgitation. No evidence of mitral valve stenosis. Tricuspid Valve: The tricuspid valve is normal in structure. Tricuspid valve regurgitation is mild . No evidence of tricuspid stenosis. Aortic Valve: The aortic valve is normal in structure. There is moderate calcification of the aortic valve. Aortic valve regurgitation is trivial. Aortic valve  sclerosis/calcification is present, without any evidence of aortic stenosis. Aortic valve mean  gradient measures 5.5 mmHg. Aortic valve peak gradient measures 10.6 mmHg. Aortic valve area, by VTI measures 2.08 cm. Pulmonic Valve: The pulmonic valve was normal in structure. Pulmonic valve regurgitation is mild. No evidence of pulmonic stenosis. Aorta: The aortic  root is normal in size and structure. Venous: The inferior vena cava is dilated in size with greater than 50% respiratory variability, suggesting right atrial pressure of 8 mmHg. IAS/Shunts: No atrial level shunt detected by color flow Doppler. Additional Comments: 3D was performed not requiring image post processing on an independent workstation and was abnormal.  LEFT VENTRICLE PLAX 2D LVIDd:         5.10 cm   Diastology LVIDs:         3.75 cm   LV e' medial:    6.16 cm/s LV PW:         1.35 cm   LV E/e' medial:  14.7 LV IVS:        1.00 cm   LV e' lateral:   7.62 cm/s LVOT diam:     1.90 cm   LV E/e' lateral: 11.9 LV SV:         74 LV SV Index:   38 LVOT Area:     2.84 cm                           3D Volume EF:                          3D EF:        48 %                          LV EDV:       151 ml                          LV ESV:       78 ml                          LV SV:        73 ml RIGHT VENTRICLE             IVC RV Basal diam:  4.20 cm     IVC diam: 2.20 cm RV S prime:     10.40 cm/s TAPSE (M-mode): 2.0 cm LEFT ATRIUM             Index        RIGHT ATRIUM           Index LA diam:        3.90 cm 2.02 cm/m   RA Area:     14.00 cm LA Vol (A2C):   48.4 ml 25.01 ml/m  RA Volume:   34.90 ml  18.03 ml/m LA Vol (A4C):   57.2 ml 29.55 ml/m LA Biplane Vol: 52.9 ml 27.33 ml/m  AORTIC VALVE                     PULMONIC VALVE AV Area (Vmax):    2.02 cm      PV Vmax:          0.95 m/s AV Area (Vmean):   2.03 cm      PV Peak grad:     3.6 mmHg AV Area (VTI):     2.08 cm      PR End Diast Vel: 3.40 msec AV Vmax:           163.00 cm/s AV Vmean:           111.500 cm/s  AV VTI:            0.355 m AV Peak Grad:      10.6 mmHg AV Mean Grad:      5.5 mmHg LVOT Vmax:         116.00 cm/s LVOT Vmean:        79.800 cm/s LVOT VTI:          0.260 m LVOT/AV VTI ratio: 0.73  AORTA Ao Root diam: 2.80 cm Ao Asc diam:  2.80 cm MITRAL VALVE MV Area (PHT): 4.21 cm     SHUNTS MV Decel Time: 180 msec     Systemic VTI:  0.26 m MV E velocity: 90.30 cm/s   Systemic Diam: 1.90 cm MV A velocity: 100.00 cm/s MV E/A ratio:  0.90 Arvilla Meres MD Electronically signed by Arvilla Meres MD Signature Date/Time: 01/26/2024/4:20:49 PM    Final    CT Angio Chest PE W/Cm &/Or Wo Cm Result Date: 01/26/2024 CLINICAL DATA:  Positive D-dimer. Cough and sore throat. Concern for pulmonary embolism. EXAM: CT ANGIOGRAPHY CHEST WITH CONTRAST TECHNIQUE: Multidetector CT imaging of the chest was performed using the standard protocol during bolus administration of intravenous contrast. Multiplanar CT image reconstructions and MIPs were obtained to evaluate the vascular anatomy. RADIATION DOSE REDUCTION: This exam was performed according to the departmental dose-optimization program which includes automated exposure control, adjustment of the mA and/or kV according to patient size and/or use of iterative reconstruction technique. CONTRAST:  75mL OMNIPAQUE IOHEXOL 350 MG/ML SOLN COMPARISON:  Chest radiograph dated 01/26/2024. FINDINGS: Cardiovascular: There is no cardiomegaly. There is 3 vessel coronary vascular calcification. Small pericardial effusion. Mild atherosclerotic calcification of the thoracic aorta. No aneurysmal dilatation or dissection. The origins of the great vessels of the aortic arch appear patent. There is mild dilatation of the main pulmonary trunk suggestive of pulmonary hypertension. Evaluation of the pulmonary arteries is limited due to suboptimal opacification and timing of the contrast. No central pulmonary artery embolus identified. Mediastinum/Nodes: No hilar adenopathy. The  esophagus is grossly unremarkable. No mediastinal fluid collection. Lungs/Pleura: Left lung base scarring and postsurgical changes. Rounded subpleural consolidation in the superior segment of the left lower lobe likely an area of round atelectasis. Pneumonia or underlying mass is not excluded. Direct comparison with prior CT images, if available, or follow-up recommended to document stability/resolution. No pleural effusion or pneumothorax. The central airways are patent. Upper Abdomen: No acute abnormality. Musculoskeletal: Renal osteodystrophy.  No acute osseous pathology. Review of the MIP images confirms the above findings. IMPRESSION: 1. No central pulmonary artery embolus identified. 2. Left lung base scarring and postsurgical changes. Rounded subpleural consolidation in the superior segment of the left lower lobe likely an area of round atelectasis. Pneumonia or underlying mass is not excluded. Direct comparison with prior CT images, if available, or follow-up recommended to document stability/resolution. 3.  Aortic Atherosclerosis (ICD10-I70.0). Electronically Signed   By: Elgie Collard M.D.   On: 01/26/2024 11:19   DG Chest 2 View Result Date: 01/26/2024 CLINICAL DATA:  Chest pain, back pain and sore throat. EXAM: CHEST - 2 VIEW COMPARISON:  Portable chest 08/06/2022 FINDINGS: There is mild cardiomegaly but the heart is less enlarged than previously. Mild central vascular prominence is present but the interstitial edema noted previously is not seen today. The mediastinum is normally outlined. The lungs are clear of infiltrates. There is increased linear scarring or atelectasis in the left base. There is no substantial pleural effusion.  Thoracic cage is intact. IMPRESSION: 1.  Mild cardiomegaly and central vascular prominence. The heart is less enlarged than previously. No overt edema. 2. Increased linear scarring or atelectasis in the left base. No focal pneumonic infiltrate. Electronically Signed    By: Almira Bar M.D.   On: 01/26/2024 06:14   Medications:  heparin 1,250 Units/hr (01/27/24 0424)    acetaminophen  1,000 mg Oral TID   aspirin EC  81 mg Oral Daily   atorvastatin  40 mg Oral Daily   Chlorhexidine Gluconate Cloth  6 each Topical Q0600   cinacalcet  30 mg Oral Q M,W,F   gabapentin  100 mg Oral QHS   gabapentin  200 mg Oral Daily   insulin aspart  0-5 Units Subcutaneous QHS   insulin aspart  0-6 Units Subcutaneous TID WC   lanthanum  2,000 mg Oral TID WC   lidocaine  1 patch Transdermal Q24H   multivitamin  1 tablet Oral q AM   sodium zirconium cyclosilicate  10 g Oral Daily     Dialysis Orders: Center: [facility] MWF 4 hrs 200NRe 400/600 85 kg 2.0 K/ 2.5 Ca AVF - No heparin - Epogen 1400 units IV three times per week       Assessment/Plan:  Atypical Chest pain-W/U per primary. Cardiology consulted.   ESRD -  MWF. HD off schedule this AM D/T staffing issues/high census. HD today and again tomorrow on regular day for volume removal.   Hyperkalemia-mild. Lokelma 10 grams PO ordered. Follow labs.   Hypertension/volume  - BP OK but now 8.9 kg above OP EDW. HD today and then again tomorrow to attempt to get volume down.  Anemia  - HGB 10.4 Continue EPO as ordered  Metabolic bone disease -  No VDRA. 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 Per primary   Medication Issues; Preferred narcotic agents for pain control are hydromorphone, fentanyl, and methadone. Morphine should not be used.  Baclofen should be avoided Avoid oral sodium phosphate and magnesium citrate based laxatives / bowel preps    Rita H. Brown NP-C 01/27/2024, 8:31 AM  [employer] [phone]     Started on heparin gtt overnight and had hyperkalemia in the interim.    Seen and examined independently.  Agree with note and exam as documented above by physician extender and as noted here.  Seen and  examined on dialysis.  Procedure supervised.  Blood pressure 127/67 and HR 97.  Tolerating goal.  Left AVF in use.  HD again tomorrow  Estanislado Emms, MD 01/27/2024  10:02 AM

## 2024-01-27 NOTE — Progress Notes (Signed)
Pt receives out-pt HD at [facility] in [facility] on MWF. Will assist as needed.   Olivia Canter Renal Navigator [phone]

## 2024-01-27 NOTE — Progress Notes (Addendum)
 Critical lab value page regarding [facility] Hurtado-Bacon's K of 6.8, which was confirmed by second lab.  Patient could not go to dialysis yesterday, which I believe is likely the culprit. Currently asymptomatic.   Tele without acute changes. EKG with normal PR, QRS, and Qtc duration and no T wave changes on serial EKGs tonight for elevated troponin.   Plan:  - Consult Nephrology this AM to prioritize dialysis.  - Temporizing measures per hyperkalemia ED protocol  - Patient insulin pump was removed; she currently uses insulin aspart in her pump - Patient is already on cardiac telemetry - Will monitor with BMP q2HR - Repeat EKG in 2 hours to monitor for changes

## 2024-01-27 NOTE — Progress Notes (Signed)
 Site area: R groin Site Prior to Removal:  Level 0 Pressure Applied For: 30 min Manual:   yes Patient Status During Pull:  stable Post Pull Site:  Level 0 Post Pull Instructions Given:  yes Post Pull Pulses Present: R DP +1 Dressing Applied:  gauze & tegaderm Bedrest begins @ 1950 Comments:

## 2024-01-27 NOTE — Progress Notes (Signed)
 PHARMACY - ANTICOAGULATION CONSULT NOTE  Pharmacy Consult for heparin infusion Indication: chest pain/ACS  Allergies  Allergen Reactions   Furosemide Swelling   Heparin Other (See Comments)    Patient relates vitreous hemorrhage after heparin. Patient relates vitreous hemorrhage after heparin.   Insulin Aspart (Human Analog) Swelling    States she takes insulin it just has mild side effects    Patient Measurements: Height: 5\' 5"  (165.1 cm) Weight: 86.2 kg (190 lb) IBW/kg (Calculated) : 57 Heparin Dosing Weight: 75.7 kg  Vital Signs: Temp: 97.5 F (36.4 C) (02/25 0215) Temp Source: Oral (02/24 2300) BP: 110/64 (02/25 0215) Pulse Rate: 65 (02/25 0215)  Labs: Recent Labs    01/26/24 0539 01/26/24 0732 01/26/24 1855 01/26/24 2202 01/27/24 0021 01/27/24 0245  HGB 10.4*  --   --   --   --   --   HCT 32.6*  --   --   --   --   --   PLT 219  --   --   --   --   --   HEPARINUNFRC  --   --   --   --   --  0.25*  CREATININE 8.35*  --   --   --   --   --   TROPONINIHS 372*   < > 2,148* 2,942* 3,118*  --    < > = values in this interval not displayed.    Estimated Creatinine Clearance: 9 mL/min (A) (by C-G formula based on SCr of 8.35 mg/dL (H)).   Medical History: Past Medical History:  Diagnosis Date   ADHD (attention deficit hyperactivity disorder)    Anemia    Arthritis    Asthma    mild = rarely uses inhaler   Blindness of left eye    retinopathy   Diabetes mellitus without complication (HCC)    type 2   Dyspnea    with exertion   Fibromyalgia    GERD (gastroesophageal reflux disease)    no meds   Hypertension    Lung nodule    1.0 cm LLL lung nodule 07/16/22 CT (evaluation by [facility] Dr. 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 year old female  admitted on 01/26/2024 with concern for ACS. No anticoagulation prior to admission. Pharmacy consulted to dose heparin.  Heparin level 0.25, subtherapeutic  No issues with infusion or bleeding noted.   Goal of Therapy:  Heparin level 0.3-0.7 units/ml Monitor platelets by anticoagulation protocol: Yes   Plan:  Increase heparin infusion to 1250 units/hr 8 heparin level  Daily heparin level, CBC, and monitoring for bleeding F/u plans for anticoagulation   Thank you for allowing pharmacy to participate in this patient's care.  Marja Kays, PharmD Emergency Medicine Clinical Pharmacist 01/27/2024,4:10 AM

## 2024-01-27 NOTE — H&P (View-Only) (Signed)
 Progress Note  Patient Name: Karen [name] Date of Encounter: 01/27/2024  [name] Cardiologist: None   Patient Profile     Subjective   Currently.  Denies any chest pain or shortness of breath  Inpatient Medications    Scheduled Meds:  acetaminophen  1,000 mg Oral TID   aspirin EC  81 mg Oral Daily   atorvastatin  40 mg Oral Daily   Chlorhexidine Gluconate Cloth  6 each Topical Q0600   cinacalcet  30 mg Oral Q M,W,F   gabapentin  100 mg Oral QHS   gabapentin  200 mg Oral Daily   insulin aspart  0-5 Units Subcutaneous QHS   insulin aspart  0-6 Units Subcutaneous TID WC   lanthanum  2,000 mg Oral TID WC   lidocaine  1 patch Transdermal Q24H   multivitamin  1 tablet Oral q AM   sodium zirconium cyclosilicate  10 g Oral Daily   Continuous Infusions:  heparin 1,250 Units/hr (01/27/24 0424)   PRN Meds: albuterol, cyclobenzaprine, lanthanum, lidocaine (PF), lidocaine-prilocaine, midodrine, pentafluoroprop-tetrafluoroeth   Vital Signs    Vitals:   01/27/24 0850 01/27/24 0930 01/27/24 1000 01/27/24 1030  BP: (!) 157/75 137/65 126/64 113/61  Pulse: 97 99 97 95  Resp: 18 12 20 12   Temp:      TempSrc:      SpO2: 97% 93% 98% 94%  Weight:      Height:       No intake or output data in the 24 hours ending 01/27/24 1039    01/27/2024    8:48 AM 01/26/2024    8:31 AM 01/26/2024    5:30 AM  Last 3 Weights  Weight (lbs) 207 lb 0.2 oz 190 lb 190 lb 11.2 oz  Weight (kg) 93.9 kg 86.183 kg 86.5 kg      Telemetry    NSR - Personally Reviewed  ECG    No new EKG to review - Personally Reviewed  Physical Exam   GEN: No acute distress.   Neck: No JVD Cardiac: RRR, no murmurs, rubs, or gallops.  Respiratory: Clear to auscultation bilaterally. GI: Soft, nontender, non-distended  MS: No edema; No deformity. Neuro:  Nonfocal  Psych: Normal affect   Labs    High Sensitivity Troponin:   Recent Labs  Lab 01/26/24 1855 01/26/24 2202 01/27/24 0021  01/27/24 0245 01/27/24 0415  TROPONINIHS 2,148* 2,942* 3,118* 3,167* 3,247*      Chemistry Recent Labs  Lab 01/26/24 0539 01/27/24 0245 01/27/24 0415 01/27/24 0420 01/27/24 0635  NA 139 133* 135 133*  --   K 5.2* 6.8* 6.6* 6.5* 5.0  CL 103 97* 101 102  --   CO2 21* 22 22  --   --   GLUCOSE 128* 121* 130* 119*  --   BUN 57* 68* 67* 70*  --   CREATININE 8.35* 9.90* 9.66* 10.50*  --   CALCIUM 9.2 8.5* 8.3*  --   --   PROT  --  5.7*  --   --   --   ALBUMIN  --  2.8* 2.8*  --   --   AST  --  26  --   --   --   ALT  --  23  --   --   --   ALKPHOS  --  186*  --   --   --   BILITOT  --  0.8  --   --   --   GFRNONAA 5* 4*  5*  --   --   ANIONGAP 15 14 12   --   --      Hematology Recent Labs  Lab 01/26/24 0539 01/27/24 0420 01/27/24 0813  WBC 8.7  --  9.5  RBC 3.37*  --  2.98*  HGB 10.4* 9.2* 9.2*  HCT 32.6* 27.0* 28.7*  MCV 96.7  --  96.3  MCH 30.9  --  30.9  MCHC 31.9  --  32.1  RDW 13.2  --  13.2  PLT 219  --  188    BNPNo results for input(s): "BNP", "PROBNP" in the last 168 hours.   DDimer  Recent Labs  Lab 01/26/24 0744  DDIMER 0.72*     Radiology    ECHOCARDIOGRAM COMPLETE Result Date: 01/26/2024    ECHOCARDIOGRAM REPORT   Patient Name:   Karen [name] Date of Exam: 01/26/2024 Medical Rec #:  [id number]             Height:       65.0 in Accession #:    [id number]            Weight:       190.0 lb Date of Birth:  09-Dec-1975             BSA:          1.935 m Patient Age:    47 years              BP:           104/52 mmHg Patient Gender: F                     HR:           74 bpm. Exam Location:  Inpatient Procedure: 2D Echo and 3D Echo (Both Spectral and Color Flow Doppler were            utilized during procedure). Indications:    Chest pain  History:        Patient has no prior history of Echocardiogram examinations.                 ESRD, Signs/Symptoms:Chest Pain; Risk Factors:Hypertension,                 Diabetes, Sleep Apnea and Former Smoker. H/o  endocarditis.  Sonographer:    Dondra Prader RVT RCS Referring Phys: EMILY DEAN IMPRESSIONS  1. Left ventricular ejection fraction, by estimation, is 40 to 45%. The left ventricle has mildly decreased function. The left ventricle demonstrates regional wall motion abnormalities (see scoring diagram/findings for description). There is mild concentric left ventricular hypertrophy. Left ventricular diastolic parameters are consistent with Grade I diastolic dysfunction (impaired relaxation).  2. Right ventricular systolic function is normal. The right ventricular size is normal.  3. Left atrial size was mildly dilated.  4. The mitral valve is degenerative. Mild mitral valve regurgitation. No evidence of mitral stenosis.  5. The aortic valve is normal in structure. There is moderate calcification of the aortic valve. Aortic valve regurgitation is trivial. Aortic valve sclerosis/calcification is present, without any evidence of aortic stenosis. Aortic valve area, by VTI measures 2.08 cm. Aortic valve mean gradient measures 5.5 mmHg. Aortic valve Vmax measures 1.63 m/s.  6. The inferior vena cava is dilated in size with >50% respiratory variability, suggesting right atrial pressure of 8 mmHg. FINDINGS  Left Ventricle: Left ventricular ejection fraction, by estimation, is 40 to 45%. The left ventricle has mildly decreased function. The  left ventricle demonstrates regional wall motion abnormalities. Strain imaging was not performed. The left ventricular  internal cavity size was normal in size. There is mild concentric left ventricular hypertrophy. Left ventricular diastolic parameters are consistent with Grade I diastolic dysfunction (impaired relaxation).  LV Wall Scoring: The mid inferoseptal segment, apical septal segment, and apical inferior segment are akinetic. The apex is hypokinetic. Right Ventricle: The right ventricular size is normal. No increase in right ventricular wall thickness. Right ventricular systolic  function is normal. Left Atrium: Left atrial size was mildly dilated. Right Atrium: Right atrial size was normal in size. Pericardium: There is no evidence of pericardial effusion. Mitral Valve: The mitral valve is degenerative in appearance. Mild mitral valve regurgitation. No evidence of mitral valve stenosis. Tricuspid Valve: The tricuspid valve is normal in structure. Tricuspid valve regurgitation is mild . No evidence of tricuspid stenosis. Aortic Valve: The aortic valve is normal in structure. There is moderate calcification of the aortic valve. Aortic valve regurgitation is trivial. Aortic valve sclerosis/calcification is present, without any evidence of aortic stenosis. Aortic valve mean  gradient measures 5.5 mmHg. Aortic valve peak gradient measures 10.6 mmHg. Aortic valve area, by VTI measures 2.08 cm. Pulmonic Valve: The pulmonic valve was normal in structure. Pulmonic valve regurgitation is mild. No evidence of pulmonic stenosis. Aorta: The aortic root is normal in size and structure. Venous: The inferior vena cava is dilated in size with greater than 50% respiratory variability, suggesting right atrial pressure of 8 mmHg. IAS/Shunts: No atrial level shunt detected by color flow Doppler. Additional Comments: 3D was performed not requiring image post processing on an independent workstation and was abnormal.  LEFT VENTRICLE PLAX 2D LVIDd:         5.10 cm   Diastology LVIDs:         3.75 cm   LV e' medial:    6.16 cm/s LV PW:         1.35 cm   LV E/e' medial:  14.7 LV IVS:        1.00 cm   LV e' lateral:   7.62 cm/s LVOT diam:     1.90 cm   LV E/e' lateral: 11.9 LV SV:         74 LV SV Index:   38 LVOT Area:     2.84 cm                           3D Volume EF:                          3D EF:        48 %                          LV EDV:       151 ml                          LV ESV:       78 ml                          LV SV:        73 ml RIGHT VENTRICLE             IVC RV Basal diam:  4.20 cm     IVC  diam: 2.20 cm RV S prime:     10.40 cm/s TAPSE (M-mode): 2.0 cm LEFT ATRIUM             Index        RIGHT ATRIUM           Index LA diam:        3.90 cm 2.02 cm/m   RA Area:     14.00 cm LA Vol (A2C):   48.4 ml 25.01 ml/m  RA Volume:   34.90 ml  18.03 ml/m LA Vol (A4C):   57.2 ml 29.55 ml/m LA Biplane Vol: 52.9 ml 27.33 ml/m  AORTIC VALVE                     PULMONIC VALVE AV Area (Vmax):    2.02 cm      PV Vmax:          0.95 m/s AV Area (Vmean):   2.03 cm      PV Peak grad:     3.6 mmHg AV Area (VTI):     2.08 cm      PR End Diast Vel: 3.40 msec AV Vmax:           163.00 cm/s AV Vmean:          111.500 cm/s AV VTI:            0.355 m AV Peak Grad:      10.6 mmHg AV Mean Grad:      5.5 mmHg LVOT Vmax:         116.00 cm/s LVOT Vmean:        79.800 cm/s LVOT VTI:          0.260 m LVOT/AV VTI ratio: 0.73  AORTA Ao Root diam: 2.80 cm Ao Asc diam:  2.80 cm MITRAL VALVE MV Area (PHT): 4.21 cm     SHUNTS MV Decel Time: 180 msec     Systemic VTI:  0.26 m MV E velocity: 90.30 cm/s   Systemic Diam: 1.90 cm MV A velocity: 100.00 cm/s MV E/A ratio:  0.90 Arvilla Meres MD Electronically signed by Arvilla Meres MD Signature Date/Time: 01/26/2024/4:20:49 PM    Final    CT Angio Chest PE W/Cm &/Or Wo Cm Result Date: 01/26/2024 CLINICAL DATA:  Positive D-dimer. Cough and sore throat. Concern for pulmonary embolism. EXAM: CT ANGIOGRAPHY CHEST WITH CONTRAST TECHNIQUE: Multidetector CT imaging of the chest was performed using the standard protocol during bolus administration of intravenous contrast. Multiplanar CT image reconstructions and MIPs were obtained to evaluate the vascular anatomy. RADIATION DOSE REDUCTION: This exam was performed according to the departmental dose-optimization program which includes automated exposure control, adjustment of the mA and/or kV according to patient size and/or use of iterative reconstruction technique. CONTRAST:  75mL OMNIPAQUE IOHEXOL 350 MG/ML SOLN COMPARISON:  Chest  radiograph dated 01/26/2024. FINDINGS: Cardiovascular: There is no cardiomegaly. There is 3 vessel coronary vascular calcification. Small pericardial effusion. Mild atherosclerotic calcification of the thoracic aorta. No aneurysmal dilatation or dissection. The origins of the great vessels of the aortic arch appear patent. There is mild dilatation of the main pulmonary trunk suggestive of pulmonary hypertension. Evaluation of the pulmonary arteries is limited due to suboptimal opacification and timing of the contrast. No central pulmonary artery embolus identified. Mediastinum/Nodes: No hilar adenopathy. The esophagus is grossly unremarkable. No mediastinal fluid collection. Lungs/Pleura: Left lung base scarring and postsurgical changes. Rounded subpleural consolidation in the superior segment of the left  lower lobe likely an area of round atelectasis. Pneumonia or underlying mass is not excluded. Direct comparison with prior CT images, if available, or follow-up recommended to document stability/resolution. No pleural effusion or pneumothorax. The central airways are patent. Upper Abdomen: No acute abnormality. Musculoskeletal: Renal osteodystrophy.  No acute osseous pathology. Review of the MIP images confirms the above findings. IMPRESSION: 1. No central pulmonary artery embolus identified. 2. Left lung base scarring and postsurgical changes. Rounded subpleural consolidation in the superior segment of the left lower lobe likely an area of round atelectasis. Pneumonia or underlying mass is not excluded. Direct comparison with prior CT images, if available, or follow-up recommended to document stability/resolution. 3.  Aortic Atherosclerosis (ICD10-I70.0). Electronically Signed   By: Elgie Collard M.D.   On: 01/26/2024 11:19   DG Chest 2 View Result Date: 01/26/2024 CLINICAL DATA:  Chest pain, back pain and sore throat. EXAM: CHEST - 2 VIEW COMPARISON:  Portable chest 08/06/2022 FINDINGS: There is mild  cardiomegaly but the heart is less enlarged than previously. Mild central vascular prominence is present but the interstitial edema noted previously is not seen today. The mediastinum is normally outlined. The lungs are clear of infiltrates. There is increased linear scarring or atelectasis in the left base. There is no substantial pleural effusion.  Thoracic cage is intact. IMPRESSION: 1. Mild cardiomegaly and central vascular prominence. The heart is less enlarged than previously. No overt edema. 2. Increased linear scarring or atelectasis in the left base. No focal pneumonic infiltrate. Electronically Signed   By: Almira Bar M.D.   On: 01/26/2024 06:14    Patient Profile     48 y.o. 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 Rosalia Hammers.   Assessment & Plan    NSTEMI (possibly type II in the setting of GI illness) Back pain -- Presented with pain across her upper back along with GI illness and sore throat.  Reported a " jolt like" sensation in her anterior chest. -- hsTn 372>>[id number]>> peak of 3247, EKG with no acute ST/T wave abnormalities but 2D echo with focal wall motion abnormalities -- Symptoms are somewhat atypical as she does report pain seems to be worse with movement and primarily across her upper back but it is between her scapulas. -- Noted above underwent stress Myoview 2024 which was negative for ischemia/scar small size reversible defect involving apical and apical inferior segments consistent with possible artifact but could not rule out subtle ischemia, LVEF of 54%. -- With her baseline CKD, unclear significance of troponin elevation at this time but now more concerning at is it has significantly trended upward -- CT angio chest negative for PE but did note three-vessel coronary calcification -- 2D echo showed EF 40 to 45%  with mild LVH, G1 DD, mild MR with mid inferior septal, apical septal and apical inferior akinesis -- Suspect she likely had an NSTEMI -- Recommend left heart cath with possible PCI today Informed Consent   Shared Decision Making/Informed Consent The risks [stroke (1 in 1000), death (1 in 1000), kidney failure [usually temporary] (1 in 500), bleeding (1 in 200), allergic reaction [possibly serious] (1 in 200)], benefits (diagnostic support and management of coronary artery disease) and alternatives of a cardiac catheterization were discussed in detail with Ms. Hurtado-Bacon and she is willing to proceed. -Patient has a history of  a vitreous hemorrhage in her eye after IV heparin in the past but given her acute MI and ongoing chest pain with uptrending enzymes I think the benefits of IV heparin for her heart outweigh the risks involved with potential bleed in her eye -Continue IV heparin drip, aspirin 81 mg daily and atorvastatin 40 mg daily -Add Lopressor 12.5 mg twice daily    GI/viral illness -- Reported episodes of diarrhea on Wednesday and Saturday/Sunday.  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 -- On a Monday Wednesday Friday schedule -- Did undergo workup for potential transplant but reports she was taken off the list   Hypertension35 -- Amlodipine 10 mg daily   S/p VATS with left lower lobectomy '21 Diabetes Fibromyalgia Wheelchair dependent    I spent 35 minutes caring for this patient today face to face, ordering and reviewing labs, reviewing records from 2D echo, chest CTA from this admission , seeing the patient, documenting in the record, and arranging for a left heart cath    For questions or updates, please contact [facility] Please consult [email or web address] for contact info under        Signed, Armanda Magic, MD  01/27/2024, 10:39 AM

## 2024-01-27 NOTE — Progress Notes (Addendum)
 Progress Note  Patient Name: Karen [name] Date of Encounter: 01/27/2024  [facility] Cardiologist: None   Patient Profile     Subjective   Currently.  Denies any chest pain or shortness of breath  Inpatient Medications    Scheduled Meds:  acetaminophen  1,000 mg Oral TID   aspirin EC  81 mg Oral Daily   atorvastatin  40 mg Oral Daily   Chlorhexidine Gluconate Cloth  6 each Topical Q0600   cinacalcet  30 mg Oral Q M,W,F   gabapentin  100 mg Oral QHS   gabapentin  200 mg Oral Daily   insulin aspart  0-5 Units Subcutaneous QHS   insulin aspart  0-6 Units Subcutaneous TID WC   lanthanum  2,000 mg Oral TID WC   lidocaine  1 patch Transdermal Q24H   multivitamin  1 tablet Oral q AM   sodium zirconium cyclosilicate  10 g Oral Daily   Continuous Infusions:  heparin 1,250 Units/hr (01/27/24 0424)   PRN Meds: albuterol, cyclobenzaprine, lanthanum, lidocaine (PF), lidocaine-prilocaine, midodrine, pentafluoroprop-tetrafluoroeth   Vital Signs    Vitals:   01/27/24 0850 01/27/24 0930 01/27/24 1000 01/27/24 1030  BP: (!) 157/75 137/65 126/64 113/61  Pulse: 97 99 97 95  Resp: 18 12 20 12   Temp:      TempSrc:      SpO2: 97% 93% 98% 94%  Weight:      Height:       No intake or output data in the 24 hours ending 01/27/24 1039    01/27/2024    8:48 AM 01/26/2024    8:31 AM 01/26/2024    5:30 AM  Last 3 Weights  Weight (lbs) 207 lb 0.2 oz 190 lb 190 lb 11.2 oz  Weight (kg) 93.9 kg 86.183 kg 86.5 kg      Telemetry    NSR - Personally Reviewed  ECG    No new EKG to review - Personally Reviewed  Physical Exam   GEN: No acute distress.   Neck: No JVD Cardiac: RRR, no murmurs, rubs, or gallops.  Respiratory: Clear to auscultation bilaterally. GI: Soft, nontender, non-distended  MS: No edema; No deformity. Neuro:  Nonfocal  Psych: Normal affect   Labs    High Sensitivity Troponin:   Recent Labs  Lab 01/26/24 1855 01/26/24 2202 01/27/24 0021  01/27/24 0245 01/27/24 0415  TROPONINIHS 2,148* 2,942* 3,118* 3,167* 3,247*      Chemistry Recent Labs  Lab 01/26/24 0539 01/27/24 0245 01/27/24 0415 01/27/24 0420 01/27/24 0635  NA 139 133* 135 133*  --   K 5.2* 6.8* 6.6* 6.5* 5.0  CL 103 97* 101 102  --   CO2 21* 22 22  --   --   GLUCOSE 128* 121* 130* 119*  --   BUN 57* 68* 67* 70*  --   CREATININE 8.35* 9.90* 9.66* 10.50*  --   CALCIUM 9.2 8.5* 8.3*  --   --   PROT  --  5.7*  --   --   --   ALBUMIN  --  2.8* 2.8*  --   --   AST  --  26  --   --   --   ALT  --  23  --   --   --   ALKPHOS  --  186*  --   --   --   BILITOT  --  0.8  --   --   --   GFRNONAA 5* 4*  5*  --   --   ANIONGAP 15 14 12   --   --      Hematology Recent Labs  Lab 01/26/24 0539 01/27/24 0420 01/27/24 0813  WBC 8.7  --  9.5  RBC 3.37*  --  2.98*  HGB 10.4* 9.2* 9.2*  HCT 32.6* 27.0* 28.7*  MCV 96.7  --  96.3  MCH 30.9  --  30.9  MCHC 31.9  --  32.1  RDW 13.2  --  13.2  PLT 219  --  188    BNPNo results for input(s): "BNP", "PROBNP" in the last 168 hours.   DDimer  Recent Labs  Lab 01/26/24 0744  DDIMER 0.72*     Radiology    ECHOCARDIOGRAM COMPLETE Result Date: 01/26/2024    ECHOCARDIOGRAM REPORT   Patient Name:   [name] Date of Exam: 01/26/2024 Medical Rec #:  [id number]             Height:       65.0 in Accession #:    [id number]            Weight:       190.0 lb Date of Birth:  09-Dec-1975             BSA:          1.935 m Patient Age:    48 years              BP:           104/52 mmHg Patient Gender: F                     HR:           74 bpm. Exam Location:  Inpatient Procedure: 2D Echo and 3D Echo (Both Spectral and Color Flow Doppler were            utilized during procedure). Indications:    Chest pain  History:        Patient has no prior history of Echocardiogram examinations.                 ESRD, Signs/Symptoms:Chest Pain; Risk Factors:Hypertension,                 Diabetes, Sleep Apnea and Former Smoker. H/o  endocarditis.  Sonographer:    Dondra Prader RVT RCS Referring Phys: EMILY DEAN IMPRESSIONS  1. Left ventricular ejection fraction, by estimation, is 40 to 45%. The left ventricle has mildly decreased function. The left ventricle demonstrates regional wall motion abnormalities (see scoring diagram/findings for description). There is mild concentric left ventricular hypertrophy. Left ventricular diastolic parameters are consistent with Grade I diastolic dysfunction (impaired relaxation).  2. Right ventricular systolic function is normal. The right ventricular size is normal.  3. Left atrial size was mildly dilated.  4. The mitral valve is degenerative. Mild mitral valve regurgitation. No evidence of mitral stenosis.  5. The aortic valve is normal in structure. There is moderate calcification of the aortic valve. Aortic valve regurgitation is trivial. Aortic valve sclerosis/calcification is present, without any evidence of aortic stenosis. Aortic valve area, by VTI measures 2.08 cm. Aortic valve mean gradient measures 5.5 mmHg. Aortic valve Vmax measures 1.63 m/s.  6. The inferior vena cava is dilated in size with >50% respiratory variability, suggesting right atrial pressure of 8 mmHg. FINDINGS  Left Ventricle: Left ventricular ejection fraction, by estimation, is 40 to 45%. The left ventricle has mildly decreased function. The  left ventricle demonstrates regional wall motion abnormalities. Strain imaging was not performed. The left ventricular  internal cavity size was normal in size. There is mild concentric left ventricular hypertrophy. Left ventricular diastolic parameters are consistent with Grade I diastolic dysfunction (impaired relaxation).  LV Wall Scoring: The mid inferoseptal segment, apical septal segment, and apical inferior segment are akinetic. The apex is hypokinetic. Right Ventricle: The right ventricular size is normal. No increase in right ventricular wall thickness. Right ventricular systolic  function is normal. Left Atrium: Left atrial size was mildly dilated. Right Atrium: Right atrial size was normal in size. Pericardium: There is no evidence of pericardial effusion. Mitral Valve: The mitral valve is degenerative in appearance. Mild mitral valve regurgitation. No evidence of mitral valve stenosis. Tricuspid Valve: The tricuspid valve is normal in structure. Tricuspid valve regurgitation is mild . No evidence of tricuspid stenosis. Aortic Valve: The aortic valve is normal in structure. There is moderate calcification of the aortic valve. Aortic valve regurgitation is trivial. Aortic valve sclerosis/calcification is present, without any evidence of aortic stenosis. Aortic valve mean  gradient measures 5.5 mmHg. Aortic valve peak gradient measures 10.6 mmHg. Aortic valve area, by VTI measures 2.08 cm. Pulmonic Valve: The pulmonic valve was normal in structure. Pulmonic valve regurgitation is mild. No evidence of pulmonic stenosis. Aorta: The aortic root is normal in size and structure. Venous: The inferior vena cava is dilated in size with greater than 50% respiratory variability, suggesting right atrial pressure of 8 mmHg. IAS/Shunts: No atrial level shunt detected by color flow Doppler. Additional Comments: 3D was performed not requiring image post processing on an independent workstation and was abnormal.  LEFT VENTRICLE PLAX 2D LVIDd:         5.10 cm   Diastology LVIDs:         3.75 cm   LV e' medial:    6.16 cm/s LV PW:         1.35 cm   LV E/e' medial:  14.7 LV IVS:        1.00 cm   LV e' lateral:   7.62 cm/s LVOT diam:     1.90 cm   LV E/e' lateral: 11.9 LV SV:         74 LV SV Index:   38 LVOT Area:     2.84 cm                           3D Volume EF:                          3D EF:        48 %                          LV EDV:       151 ml                          LV ESV:       78 ml                          LV SV:        73 ml RIGHT VENTRICLE             IVC RV Basal diam:  4.20 cm     IVC  diam: 2.20 cm RV S prime:     10.40 cm/s TAPSE (M-mode): 2.0 cm LEFT ATRIUM             Index        RIGHT ATRIUM           Index LA diam:        3.90 cm 2.02 cm/m   RA Area:     14.00 cm LA Vol (A2C):   48.4 ml 25.01 ml/m  RA Volume:   34.90 ml  18.03 ml/m LA Vol (A4C):   57.2 ml 29.55 ml/m LA Biplane Vol: 52.9 ml 27.33 ml/m  AORTIC VALVE                     PULMONIC VALVE AV Area (Vmax):    2.02 cm      PV Vmax:          0.95 m/s AV Area (Vmean):   2.03 cm      PV Peak grad:     3.6 mmHg AV Area (VTI):     2.08 cm      PR End Diast Vel: 3.40 msec AV Vmax:           163.00 cm/s AV Vmean:          111.500 cm/s AV VTI:            0.355 m AV Peak Grad:      10.6 mmHg AV Mean Grad:      5.5 mmHg LVOT Vmax:         116.00 cm/s LVOT Vmean:        79.800 cm/s LVOT VTI:          0.260 m LVOT/AV VTI ratio: 0.73  AORTA Ao Root diam: 2.80 cm Ao Asc diam:  2.80 cm MITRAL VALVE MV Area (PHT): 4.21 cm     SHUNTS MV Decel Time: 180 msec     Systemic VTI:  0.26 m MV E velocity: 90.30 cm/s   Systemic Diam: 1.90 cm MV A velocity: 100.00 cm/s MV E/A ratio:  0.90 Arvilla Meres MD Electronically signed by Arvilla Meres MD Signature Date/Time: 01/26/2024/4:20:49 PM    Final    CT Angio Chest PE W/Cm &/Or Wo Cm Result Date: 01/26/2024 CLINICAL DATA:  Positive D-dimer. Cough and sore throat. Concern for pulmonary embolism. EXAM: CT ANGIOGRAPHY CHEST WITH CONTRAST TECHNIQUE: Multidetector CT imaging of the chest was performed using the standard protocol during bolus administration of intravenous contrast. Multiplanar CT image reconstructions and MIPs were obtained to evaluate the vascular anatomy. RADIATION DOSE REDUCTION: This exam was performed according to the departmental dose-optimization program which includes automated exposure control, adjustment of the mA and/or kV according to patient size and/or use of iterative reconstruction technique. CONTRAST:  75mL OMNIPAQUE IOHEXOL 350 MG/ML SOLN COMPARISON:  Chest  radiograph dated 01/26/2024. FINDINGS: Cardiovascular: There is no cardiomegaly. There is 3 vessel coronary vascular calcification. Small pericardial effusion. Mild atherosclerotic calcification of the thoracic aorta. No aneurysmal dilatation or dissection. The origins of the great vessels of the aortic arch appear patent. There is mild dilatation of the main pulmonary trunk suggestive of pulmonary hypertension. Evaluation of the pulmonary arteries is limited due to suboptimal opacification and timing of the contrast. No central pulmonary artery embolus identified. Mediastinum/Nodes: No hilar adenopathy. The esophagus is grossly unremarkable. No mediastinal fluid collection. Lungs/Pleura: Left lung base scarring and postsurgical changes. Rounded subpleural consolidation in the superior segment of the left  lower lobe likely an area of round atelectasis. Pneumonia or underlying mass is not excluded. Direct comparison with prior CT images, if available, or follow-up recommended to document stability/resolution. No pleural effusion or pneumothorax. The central airways are patent. Upper Abdomen: No acute abnormality. Musculoskeletal: Renal osteodystrophy.  No acute osseous pathology. Review of the MIP images confirms the above findings. IMPRESSION: 1. No central pulmonary artery embolus identified. 2. Left lung base scarring and postsurgical changes. Rounded subpleural consolidation in the superior segment of the left lower lobe likely an area of round atelectasis. Pneumonia or underlying mass is not excluded. Direct comparison with prior CT images, if available, or follow-up recommended to document stability/resolution. 3.  Aortic Atherosclerosis (ICD10-I70.0). Electronically Signed   By: Elgie Collard M.D.   On: 01/26/2024 11:19   DG Chest 2 View Result Date: 01/26/2024 CLINICAL DATA:  Chest pain, back pain and sore throat. EXAM: CHEST - 2 VIEW COMPARISON:  Portable chest 08/06/2022 FINDINGS: There is mild  cardiomegaly but the heart is less enlarged than previously. Mild central vascular prominence is present but the interstitial edema noted previously is not seen today. The mediastinum is normally outlined. The lungs are clear of infiltrates. There is increased linear scarring or atelectasis in the left base. There is no substantial pleural effusion.  Thoracic cage is intact. IMPRESSION: 1. Mild cardiomegaly and central vascular prominence. The heart is less enlarged than previously. No overt edema. 2. Increased linear scarring or atelectasis in the left base. No focal pneumonic infiltrate. Electronically Signed   By: Almira Bar M.D.   On: 01/26/2024 06:14    Patient Profile     48 y.o. 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 Rosalia Hammers.   Assessment & Plan    NSTEMI (possibly type II in the setting of GI illness) Back pain -- Presented with pain across her upper back along with GI illness and sore throat.  Reported a " jolt like" sensation in her anterior chest. -- hsTn 372>>612>> peak of 3247, EKG with no acute ST/T wave abnormalities but 2D echo with focal wall motion abnormalities -- Symptoms are somewhat atypical as she does report pain seems to be worse with movement and primarily across her upper back but it is between her scapulas. -- Noted above underwent stress Myoview 2024 which was negative for ischemia/scar small size reversible defect involving apical and apical inferior segments consistent with possible artifact but could not rule out subtle ischemia, LVEF of 54%. -- With her baseline CKD, unclear significance of troponin elevation at this time but now more concerning at is it has significantly trended upward -- CT angio chest negative for PE but did note three-vessel coronary calcification -- 2D echo showed EF 40 to 45%  with mild LVH, G1 DD, mild MR with mid inferior septal, apical septal and apical inferior akinesis -- Suspect she likely had an NSTEMI -- Recommend left heart cath with possible PCI today Informed Consent   Shared Decision Making/Informed Consent The risks [stroke (1 in 1000), death (1 in 1000), kidney failure [usually temporary] (1 in 500), bleeding (1 in 200), allergic reaction [possibly serious] (1 in 200)], benefits (diagnostic support and management of coronary artery disease) and alternatives of a cardiac catheterization were discussed in detail with Ms. Hurtado-Bacon and she is willing to proceed. -Patient has a history of  a vitreous hemorrhage in her eye after IV heparin in the past but given her acute MI and ongoing chest pain with uptrending enzymes I think the benefits of IV heparin for her heart outweigh the risks involved with potential bleed in her eye -Continue IV heparin drip, aspirin 81 mg daily and atorvastatin 40 mg daily -Add Lopressor 12.5 mg twice daily    GI/viral illness -- Reported episodes of diarrhea on Wednesday and Saturday/Sunday.  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 -- On a Monday Wednesday Friday schedule -- Did undergo workup for potential transplant but reports she was taken off the list   Hypertension35 -- Amlodipine 10 mg daily   S/p VATS with left lower lobectomy '21 Diabetes Fibromyalgia Wheelchair dependent    I spent 35 minutes caring for this patient today face to face, ordering and reviewing labs, reviewing records from 2D echo, chest CTA from this admission , seeing the patient, documenting in the record, and arranging for a left heart cath    For questions or updates, please contact [facility] Please consult [email or web address] for contact info under        Signed, Armanda Magic, MD  01/27/2024, 10:39 AM

## 2024-01-27 NOTE — Progress Notes (Signed)
 Subjective: Karen [name] is a 48 y.o. F with pertinent PMH of ESRD on MWF HD, HTN, asthma, fibromyalgia who presented with radiating back pain and sore throat admitted for an NSTEMI.   Today, she is having a lot of cramping back pain that did not improve with the flexeril. Patient would like to try to get through part of HD prior to having pain medications. She is having some chest tightness and nausea. She denies abdominal pain and shortness of breath.   Objective:  Vital signs in last 24 hours: Vitals:   01/27/24 0850 01/27/24 0930 01/27/24 1000 01/27/24 1030  BP: (!) 157/75 137/65 126/64 113/61  Pulse: 97 99 97 95  Resp: 18 12 20 12   Temp:      TempSrc:      SpO2: 97% 93% 98% 94%  Weight:      Height:       Physical Exam: General:Appears uncomfortable, in HD  Cardiac:RRR, fistula is in active HD use Pulmonary:Limited due to current HD session: clear to auscultate lower lobes, normal effort on room air  Abdominal:soft, BM sounds present  Neuro:awake, alert, participating in conversation  Skin:warm and dry Psych:very pleasant, normal mood      Latest Ref Rng & Units 01/27/2024    8:13 AM 01/27/2024    4:20 AM 01/26/2024    5:39 AM  CBC  WBC 4.0 - 10.5 K/uL 9.5   8.7   Hemoglobin 12.0 - 15.0 g/dL 9.2  9.2  16.1   Hematocrit 36.0 - 46.0 % 28.7  27.0  32.6   Platelets 150 - 400 K/uL 188   219        Latest Ref Rng & Units 01/27/2024    6:35 AM 01/27/2024    4:20 AM 01/27/2024    4:15 AM  BMP  Glucose 70 - 99 mg/dL  096  045   BUN 6 - 20 mg/dL  70  67   Creatinine 4.09 - 1.00 mg/dL  81.19  1.47   Sodium 829 - 145 mmol/L  133  135   Potassium 3.5 - 5.1 mmol/L 5.0  6.5  6.6   Chloride 98 - 111 mmol/L  102  101   CO2 22 - 32 mmol/L   22   Calcium 8.9 - 10.3 mg/dL   8.3     Troponin: 5,621  Echocardiogram: LVEF of 40-45%, region wall motion abnormalities   Assessment/Plan:  Principal Problem:   NSTEMI (non-ST elevated myocardial infarction) (HCC) Active  Problems:   ESRD on hemodialysis (HCC)   Essential hypertension   Mild intermittent asthma without complication   Chest pain   Elevated troponin  NSTEMI Patient presented with back pain that radiated to chest and was found to have a NSTEMI. Echocardiogram revealed regional wall motion abnormalities. She remains hemodynamically stable was started on a heparin drip.  Plan: -Cardiology consulted, appreciate recommendations -Continue heparin drip -LHC today, will follow up on the recommendations afterwards -Continue ASA -Avoid morphine for pain due to ESRD -May use dilaudid if needed, please reach out if patient is having chest pain/worsening back pain  -Continue telemetry   ESRD Hyperkalemia  Patient receives HD every MWF. She is reciving HD at this moment. The hyperkalemia was most likely from  her not having HD yesterday. The hyperkalemia has resolved after use of insulin, calcium gluconate, and lokelma.   Plan: -Nephrology consulted, appreciate their recommendations with HD  -Will recheck RFP this afternoon  T2DM Patient is on an insulin pump  at home.  Plan: -Begin SSI   HTN On amlodipine 10 mg daily at home; hydralazine is reportedly PRN. Plan: -Restart home amlodipine 10 mg daily when BP more stable   Fibromyalgia Plan: -Continue home gabapentin 200 mg each morning and 100 mg each evening   Asthma No respiratory distress, stable on RA. No concern for acute exacerbation. Plan: -Albuterol 1 puff q6h PRN wheezing, shortness of breath  Resolved Problems:  __________________________________  Code Status: Full VTE Prophylaxis:Heparin drip  Diet:NPO IVF:N/A Barriers to Discharge:Medical evaluation for NSTEMI Dispo: Anticipated discharge in approximately 1-2 day(s).   Faith Rogue, DO 01/27/2024, 10:45 AM Pager: [phone] After 5pm on weekdays and 1pm on weekends: On Call pager [phone]

## 2024-01-27 NOTE — Procedures (Signed)
 Received patient in bed to unit.  Alert and oriented.  Informed consent signed and in chart.   TX duration: 3 hours  Patient tolerated well.  Transported back to the room  Alert, without acute distress.  Hand-off given to patient's nurse.   Access used: left avf Access issues: none  Total UF removed: 3.5 liters Medication(s) given: dilaudid  Lu Duffel, RN [facility]

## 2024-01-27 NOTE — Progress Notes (Signed)
 Heart Failure Navigator Progress Note  Assessed for Heart & Vascular TOC clinic readiness.  Patient does not meet criteria due to ESRD on hemodialysis.   Navigator will sign off at this time.   Rhae Hammock, BSN, Scientist, clinical (histocompatibility and immunogenetics) Only

## 2024-01-28 ENCOUNTER — Other Ambulatory Visit (HOSPITAL_COMMUNITY): Payer: Self-pay

## 2024-01-28 ENCOUNTER — Encounter (HOSPITAL_COMMUNITY): Payer: Self-pay | Admitting: Cardiovascular Disease

## 2024-01-28 ENCOUNTER — Telehealth (HOSPITAL_COMMUNITY): Payer: Self-pay | Admitting: Pharmacy Technician

## 2024-01-28 ENCOUNTER — Telehealth: Payer: Self-pay | Admitting: Cardiology

## 2024-01-28 DIAGNOSIS — I420 Dilated cardiomyopathy: Secondary | ICD-10-CM | POA: Diagnosis not present

## 2024-01-28 DIAGNOSIS — I214 Non-ST elevation (NSTEMI) myocardial infarction: Secondary | ICD-10-CM | POA: Diagnosis not present

## 2024-01-28 DIAGNOSIS — M546 Pain in thoracic spine: Secondary | ICD-10-CM | POA: Diagnosis not present

## 2024-01-28 DIAGNOSIS — I2510 Atherosclerotic heart disease of native coronary artery without angina pectoris: Secondary | ICD-10-CM | POA: Diagnosis not present

## 2024-01-28 LAB — CBC
HCT: 26.9 % — ABNORMAL LOW (ref 36.0–46.0)
Hemoglobin: 9 g/dL — ABNORMAL LOW (ref 12.0–15.0)
MCH: 30.9 pg (ref 26.0–34.0)
MCHC: 33.5 g/dL (ref 30.0–36.0)
MCV: 92.4 fL (ref 80.0–100.0)
Platelets: 208 10*3/uL (ref 150–400)
RBC: 2.91 MIL/uL — ABNORMAL LOW (ref 3.87–5.11)
RDW: 13.2 % (ref 11.5–15.5)
WBC: 7.1 10*3/uL (ref 4.0–10.5)
nRBC: 0 % (ref 0.0–0.2)

## 2024-01-28 LAB — RENAL FUNCTION PANEL
Albumin: 2.8 g/dL — ABNORMAL LOW (ref 3.5–5.0)
Anion gap: 12 (ref 5–15)
BUN: 45 mg/dL — ABNORMAL HIGH (ref 6–20)
CO2: 23 mmol/L (ref 22–32)
Calcium: 8.7 mg/dL — ABNORMAL LOW (ref 8.9–10.3)
Chloride: 94 mmol/L — ABNORMAL LOW (ref 98–111)
Creatinine, Ser: 7.23 mg/dL — ABNORMAL HIGH (ref 0.44–1.00)
GFR, Estimated: 7 mL/min — ABNORMAL LOW (ref >60)
Glucose, Bld: 219 mg/dL — ABNORMAL HIGH (ref 70–99)
Phosphorus: 6.4 mg/dL — ABNORMAL HIGH (ref 2.5–4.6)
Potassium: 4.8 mmol/L (ref 3.5–5.1)
Sodium: 129 mmol/L — ABNORMAL LOW (ref 135–145)

## 2024-01-28 LAB — POCT ACTIVATED CLOTTING TIME
Activated Clotting Time: 262 s
Activated Clotting Time: 285 s
Activated Clotting Time: 262 s

## 2024-01-28 LAB — MAGNESIUM: Magnesium: 2.4 mg/dL (ref 1.7–2.4)

## 2024-01-28 LAB — HEMOGLOBIN A1C
Hgb A1c MFr Bld: 7.4 % — ABNORMAL HIGH (ref 4.8–5.6)
Mean Plasma Glucose: 166 mg/dL

## 2024-01-28 LAB — GLUCOSE, CAPILLARY
Glucose-Capillary: 226 mg/dL — ABNORMAL HIGH (ref 70–99)
Glucose-Capillary: 171 mg/dL — ABNORMAL HIGH (ref 70–99)
Glucose-Capillary: 105 mg/dL — ABNORMAL HIGH (ref 70–99)

## 2024-01-28 MED ORDER — LIDOCAINE 5 % EX PTCH: 1.0000 | MEDICATED_PATCH | CUTANEOUS | Status: DC

## 2024-01-28 MED ORDER — TICAGRELOR 90 MG PO TABS: 90.0000 mg | ORAL_TABLET | Freq: Two times a day (BID) | ORAL | 0 refills | Status: DC

## 2024-01-28 MED ORDER — ASPIRIN 81 MG PO TBEC
81.0000 mg | DELAYED_RELEASE_TABLET | Freq: Every day | ORAL | 0 refills | Status: DC
  Filled 2024-01-28: qty 30, 30d supply, fill #0

## 2024-01-28 MED ORDER — METOPROLOL SUCCINATE ER 25 MG PO TB24
25.0000 mg | ORAL_TABLET | Freq: Every day | ORAL | Status: DC
  Administered 2024-01-28: 25 mg via ORAL
  Filled 2024-01-28: qty 1

## 2024-01-28 MED ORDER — ATORVASTATIN CALCIUM 40 MG PO TABS: 40.0000 mg | ORAL_TABLET | Freq: Every day | ORAL | 0 refills | Status: DC

## 2024-01-28 MED ORDER — METOPROLOL SUCCINATE ER 25 MG PO TB24: 25.0000 mg | ORAL_TABLET | Freq: Every day | ORAL | 0 refills | Status: DC

## 2024-01-28 MED ORDER — VIPERSLIDE LUBRICANT OPTIME: TOPICAL | Status: DC | PRN

## 2024-01-28 MED ORDER — TICAGRELOR 90 MG PO TABS
90.0000 mg | ORAL_TABLET | Freq: Two times a day (BID) | ORAL | 0 refills | Status: DC
  Filled 2024-01-28: qty 60, 30d supply, fill #0

## 2024-01-28 MED ORDER — ATORVASTATIN CALCIUM 40 MG PO TABS
40.0000 mg | ORAL_TABLET | Freq: Every day | ORAL | 0 refills | Status: DC
  Filled 2024-01-28: qty 30, 30d supply, fill #0

## 2024-01-28 MED ORDER — ASPIRIN 81 MG PO TBEC: 81.0000 mg | DELAYED_RELEASE_TABLET | Freq: Every day | ORAL | 0 refills | Status: DC

## 2024-01-28 MED ORDER — METOPROLOL SUCCINATE ER 25 MG PO TB24
25.0000 mg | ORAL_TABLET | Freq: Every day | ORAL | 0 refills | Status: DC
  Filled 2024-01-28: qty 30, 30d supply, fill #0

## 2024-01-28 NOTE — Progress Notes (Signed)
 PT Cancellation Note  Patient Details Name: Karen [name] MRN: [id number] DOB: [birth year]   Cancelled Treatment:    Reason Eval/Treat Not Completed: Patient at procedure or test/unavailable.Chart indicates the pt is in dialysis. PT will follow up for evaluation once the pt has returned to their hospital room.   Arlyss Gandy 01/28/2024, 9:22 AM

## 2024-01-28 NOTE — Discharge Summary (Addendum)
 Name: Karen [name] MRN: [id number] DOB: [birth year] 48 y.o. PCP: Loura Pardon, PA  Date of Admission: 01/26/2024  4:44 AM Date of Discharge: 01/28/2024 2:01 PM Attending Physician: Dr. Antony Contras  Discharge Diagnosis: Principal Problem:   NSTEMI (non-ST elevated myocardial infarction) [facility]) Active Problems:   ESRD on hemodialysis (HCC)   History of endocarditis   Essential hypertension   Mild intermittent asthma without complication   Chest pain   Elevated troponin    Discharge Medications: Allergies as of 01/28/2024       Reactions   Furosemide Swelling   Heparin Other (See Comments)   Patient relates vitreous hemorrhage after heparin. Patient relates vitreous hemorrhage after heparin.   Insulin Aspart (human Analog) Swelling   States she takes insulin it just has mild side effects        Medication List     PAUSE taking these medications    amLODipine 10 MG tablet Wait to take this until your doctor or other care provider tells you to start again. Commonly known as: NORVASC Take 1 tablet (10 mg total) by mouth daily.       TAKE these medications    albuterol 108 (90 Base) MCG/ACT inhaler Commonly known as: VENTOLIN HFA Inhale 1-2 puffs into the lungs every 6 (six) hours as needed for wheezing or shortness of breath.   ammonium lactate 12 % cream Commonly known as: AMLACTIN Apply 1 Application topically as needed for dry skin.   aspirin EC 81 MG tablet Take 1 tablet (81 mg total) by mouth daily. Swallow whole.   atorvastatin 40 MG tablet Commonly known as: LIPITOR Take 1 tablet (40 mg total) by mouth daily.   BD Pen Needle Nano U/F 32G X 4 MM Misc Generic drug: Insulin Pen Needle Use with insulin 4x/day   cinacalcet 30 MG tablet Commonly known as: SENSIPAR Take 30 mg by mouth every Monday, Wednesday, and Friday. After hemodialysis   erythromycin with ethanol 2 % external solution Commonly known as: THERAMYCIN Apply 1  Application topically in the morning.   ethyl chloride spray Apply 1 Application topically as needed (prior to dialysis).   Fosrenol 1000 MG Pack Generic drug: Lanthanum Carbonate Take 1,000-2,000 mg by mouth See admin instructions. Take 2 packets (2000 mg) by mouth with each meal & take 1 packet (1000 mg) by mouth with each snack   gabapentin 100 MG capsule Commonly known as: NEURONTIN Take 100-200 mg by mouth See admin instructions. Take 2 capsules (200 mg) by mouth every morning & take 1 capsule (100 mg) by mouth at bedtime.   glucagon 1 MG injection Inject 1 mg into the muscle once as needed (low blood sugar).   hydrALAZINE 50 MG tablet Commonly known as: APRESOLINE Take 50 mg by mouth in the morning and at bedtime.   insulin aspart 100 UNIT/ML injection Commonly known as: novoLOG Inject 0-10 Units into the skin See admin instructions. Via insulin pump   insulin glargine 100 UNIT/ML Solostar Pen Commonly known as: LANTUS Inject 20 Units into the skin at bedtime.   ketoconazole 2 % shampoo Commonly known as: NIZORAL Apply 1 Application topically once a week.   lidocaine 5 % Commonly known as: LIDODERM Place 1 patch onto the skin daily.   loperamide 2 MG capsule Commonly known as: IMODIUM Take 4 mg by mouth as needed for diarrhea or loose stools.   metoprolol succinate 25 MG 24 hr tablet Commonly known as: TOPROL-XL Take 1 tablet (25 mg total) by mouth  daily.   Ozempic (0.25 or 0.5 MG/DOSE) 2 MG/3ML Sopn Generic drug: Semaglutide(0.25 or 0.5MG /DOS) Inject 2 mg into the skin once a week.   RENA-VITE PO Take 1 tablet by mouth in the morning.   ticagrelor 90 MG Tabs tablet Commonly known as: BRILINTA Take 1 tablet (90 mg total) by mouth 2 (two) times daily.        Disposition and follow-up:   Karen [name] was discharged from [facility] in Good condition.  At the hospital follow up visit please address:  1.   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 Rounded subpleural consolidation in the superior segment of the left lower lobe likely an area of round atelectasis. Pneumonia or underlying mass is not excluded. -Follow up recommended to document stability/resolution   2.  Labs / imaging needed at time of follow-up: BMP at follow up, lipid profile and CMP at 6 weeks   3.  Pending labs/ test needing follow-up: N/A  4.  Medication Changes  STOPPED  -Amlodipine    ADDED -Aspirin 81 mg daily  -Lipitor 40mg  daily  -Brilinta 90 mg BID  -Metoprolol tartrate (Lopressor) 25mg  daily    MODIFIED  -N/A     Follow-up Appointments:  Follow-up Information     Louanne Skye Devoria Albe., NP Follow up.   Specialty: Cardiology Why: Friday Feb 13, 2024 Arrive by 8:10 AMAppt at 8:25 AM (25 min) Contact information: [address] [place] [address] [phone]         Loura Pardon, [place]. Schedule an appointment as soon as possible for a visit in 1 week(s).   Specialty: Physician Assistant Contact information: [address] [place] [address] [phone]                 Hospital Course by problem list: NSTEMI CAD s/p DES to the LAD HFrEF Patient presented with atypical chest pain. She had back pain that radiated to her chest. EKG remained unchanged;however,  the troponin levels increased to 3,247. Echocardiogram revealed regional wall motion abnormalities with a LVEF of 40-45%. She was started on a heparin drip and underwent PCI. During the catheterization, she was found to have a distal circumflex lesion that was 99% stenosed but was too small for stent placement. She also had a mid LAD lesion that was 99% stenosis and a drug eluting stent was placed. She was started on aspirin, Brilinta, metoprolol, and Lipitor. Her back pain and chest pain have both resolved.   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 No respiratory distress or increased work of breathing compared to baseline extertional dyspnea. No prior CT imaging for comparison.   Type 2 diabetes mellitus Has insulin pump at home, was transitioned to SSI during hospitalization.   ESRD on MWF HD Hyperkalemia Patient was hyperkalemic during hospitalization requiring lokelma, insulin, and calcium  gluconate. She received HD on 2/25 and 2/26 while hospitalized. Potassium was 4.8 on day of discharge.  Continued home cinacalcet 30 mg daily on MWF, lanthanum 2000 mg with full meal and 1000 mg with snacks, rena-vit 1 tablet daily.   HTN Patient's blood pressure regimen was held during hospitalization. Amlodipine was discontinued at discharge due to her beginning Metoprolol.    Fibromyalgia Continued home gabapentin 200 mg each morning and 100 mg each evening   Asthma No respiratory distress, remained stable on RA and we continued albuterol 1 puff q6h PRN wheezing, shortness of breath      Discharge Subjective: Today, her back and chest pain have resolved. Patient no longer endorses nausea. She is feeling better but still feels a bit fatigued. We spoke about the medication changes that we will be making.  After HD, patient stated that she was feeling well and was ready to go home. She denies any pain at the moment and would like to rest at home.  Patient declined both  OT and PT evaluations.   Patient is using an insulin pump at home and will be able to use the insulin pump once she is discharged home today, the rate is preset by her endocrinologist.   Discharge Exam:   Blood pressure (!) 109/57, pulse 88, temperature 98.3 F (36.8 C), resp. rate 15, height 5\' 5"  (1.651 m), weight 90.7 kg, SpO2 100%.   Per progress note, exam on 2/26: Physical Exam: General:NAD, currently in HD, NAD Cardiac:RRR Pulmonary:normal effort on room air, LUE AVF is currently in use at HD  Abdominal:soft, non-tender  Neuro:awake, alert, participating in conversation  MSK:No pitting edema appreciated  Skin:warm and dry  Psych:  normal mood, pleasant affect   Pertinent Labs, Studies, and Procedures:     Latest Ref Rng & Units 01/28/2024    9:38 AM 01/27/2024    8:13 AM 01/27/2024    4:20 AM  CBC  WBC 4.0 - 10.5 K/uL 7.1  9.5    Hemoglobin 12.0 - 15.0 g/dL 9.0  9.2  9.2   Hematocrit 36.0 - 46.0 % 26.9  28.7  27.0   Platelets 150 - 400 K/uL 208  188         Latest Ref Rng & Units 01/28/2024    9:38 AM 01/27/2024    8:51 PM 01/27/2024    6:35 AM  CMP  Glucose 70 - 99 mg/dL 409  811    BUN 6 - 20 mg/dL 45  38    Creatinine 9.14 - 1.00 mg/dL 7.82  9.56    Sodium 213 - 145 mmol/L 129  130    Potassium 3.5 - 5.1 mmol/L 4.8  5.7  5.0   Chloride 98 - 111 mmol/L 94  93    CO2 22 - 32 mmol/L 23  25    Calcium 8.9 - 10.3 mg/dL 8.7  8.8      CARDIAC CATHETERIZATION Result Date: 01/27/2024   Prox RCA to Mid RCA lesion is 20% stenosed.   Ost Cx to Prox Cx lesion is 20% stenosed.   Dist Cx lesion is 99% stenosed.   Mid LAD lesion is 99% stenosed.   Mid LAD to Dist LAD lesion is 30% stenosed.   A drug-eluting stent was successfully placed using a SYNERGY XD 2.50X24.   Post intervention, there is a 0% residual stenosis. Severe, heavily calcified stenosis in the mid LAD. Successful PTCA/orbital atherectomy/DES x 1 mid LAD Severe stenosis in the small caliber distal  Circumflex. Vessel  too small for PCI Large dominant RCA with heavy calcification throughout the proximal and mid vessel with mild diffuse stenosis. LVEDP=18 mmHg Recommendations: Will continue DAPT with ASA and Brilinta for one year. High intensity statin. Medical management of the disease in the small caliber distal Circumflex.   ECHOCARDIOGRAM COMPLETE Result Date: 01/26/2024    ECHOCARDIOGRAM REPORT   Patient Name:   [facility] Date of Exam: 01/26/2024 Medical Rec #:  [id number]             Height:       65.0 in Accession #:    [id number]            Weight:       190.0 lb Date of Birth:  15-Feb-1976             BSA:          1.935 m Patient Age:    47 years              BP:           104/52 mmHg Patient Gender: F                     HR:           74 bpm. Exam Location:  Inpatient Procedure: 2D Echo and 3D Echo (Both Spectral and Color Flow Doppler were            utilized during procedure). Indications:    Chest pain  History:        Patient has no prior history of Echocardiogram examinations.                 ESRD, Signs/Symptoms:Chest Pain; Risk Factors:Hypertension,                 Diabetes, Sleep Apnea and Former Smoker. H/o endocarditis.  Sonographer:    Dondra Prader RVT RCS Referring Phys: Azoria Abbett DEAN IMPRESSIONS  1. Left ventricular ejection fraction, by estimation, is 40 to 45%. The left ventricle has mildly decreased function. The left ventricle demonstrates regional wall motion abnormalities (see scoring diagram/findings for description). There is mild concentric left ventricular hypertrophy. Left ventricular diastolic parameters are consistent with Grade I diastolic dysfunction (impaired relaxation).  2. Right ventricular systolic function is normal. The right ventricular size is normal.  3. Left atrial size was mildly dilated.  4. The mitral valve is degenerative. Mild mitral valve regurgitation. No evidence of mitral stenosis.  5. The aortic valve is normal in structure. There is moderate calcification of the  aortic valve. Aortic valve regurgitation is trivial. Aortic valve sclerosis/calcification is present, without any evidence of aortic stenosis. Aortic valve area, by VTI measures 2.08 cm. Aortic valve mean gradient measures 5.5 mmHg. Aortic valve Vmax measures 1.63 m/s.  6. The inferior vena cava is dilated in size with >50% respiratory variability, suggesting right atrial pressure of 8 mmHg. FINDINGS  Left Ventricle: Left ventricular ejection fraction, by estimation, is 40 to 45%. The left ventricle has mildly decreased function. The left ventricle demonstrates regional wall motion abnormalities. Strain imaging was not performed. The left ventricular  internal cavity size was normal in size. There is mild concentric left ventricular hypertrophy. Left ventricular diastolic parameters are consistent with Grade I diastolic dysfunction (impaired relaxation).  LV Wall Scoring: The mid inferoseptal segment, apical septal segment, and apical inferior segment are akinetic. The apex is hypokinetic. Right Ventricle: The right ventricular size is normal. No increase in  right ventricular wall thickness. Right ventricular systolic function is normal. Left Atrium: Left atrial size was mildly dilated. Right Atrium: Right atrial size was normal in size. Pericardium: There is no evidence of pericardial effusion. Mitral Valve: The mitral valve is degenerative in appearance. Mild mitral valve regurgitation. No evidence of mitral valve stenosis. Tricuspid Valve: The tricuspid valve is normal in structure. Tricuspid valve regurgitation is mild . No evidence of tricuspid stenosis. Aortic Valve: The aortic valve is normal in structure. There is moderate calcification of the aortic valve. Aortic valve regurgitation is trivial. Aortic valve sclerosis/calcification is present, without any evidence of aortic stenosis. Aortic valve mean  gradient measures 5.5 mmHg. Aortic valve peak gradient measures 10.6 mmHg. Aortic valve area, by VTI  measures 2.08 cm. Pulmonic Valve: The pulmonic valve was normal in structure. Pulmonic valve regurgitation is mild. No evidence of pulmonic stenosis. Aorta: The aortic root is normal in size and structure. Venous: The inferior vena cava is dilated in size with greater than 50% respiratory variability, suggesting right atrial pressure of 8 mmHg. IAS/Shunts: No atrial level shunt detected by color flow Doppler. Additional Comments: 3D was performed not requiring image post processing on an independent workstation and was abnormal.  LEFT VENTRICLE PLAX 2D LVIDd:         5.10 cm   Diastology LVIDs:         3.75 cm   LV e' medial:    6.16 cm/s LV PW:         1.35 cm   LV E/e' medial:  14.7 LV IVS:        1.00 cm   LV e' lateral:   7.62 cm/s LVOT diam:     1.90 cm   LV E/e' lateral: 11.9 LV SV:         74 LV SV Index:   38 LVOT Area:     2.84 cm                           3D Volume EF:                          3D EF:        48 %                          LV EDV:       151 ml                          LV ESV:       78 ml                          LV SV:        73 ml RIGHT VENTRICLE             IVC RV Basal diam:  4.20 cm     IVC diam: 2.20 cm RV S prime:     10.40 cm/s TAPSE (M-mode): 2.0 cm LEFT ATRIUM             Index        RIGHT ATRIUM           Index LA diam:        3.90 cm 2.02 cm/m   RA Area:     14.00 cm LA Vol (  A2C):   48.4 ml 25.01 ml/m  RA Volume:   34.90 ml  18.03 ml/m LA Vol (A4C):   57.2 ml 29.55 ml/m LA Biplane Vol: 52.9 ml 27.33 ml/m  AORTIC VALVE                     PULMONIC VALVE AV Area (Vmax):    2.02 cm      PV Vmax:          0.95 m/s AV Area (Vmean):   2.03 cm      PV Peak grad:     3.6 mmHg AV Area (VTI):     2.08 cm      PR End Diast Vel: 3.40 msec AV Vmax:           163.00 cm/s AV Vmean:          111.500 cm/s AV VTI:            0.355 m AV Peak Grad:      10.6 mmHg AV Mean Grad:      5.5 mmHg LVOT Vmax:         116.00 cm/s LVOT Vmean:        79.800 cm/s LVOT VTI:          0.260 m LVOT/AV VTI  ratio: 0.73  AORTA Ao Root diam: 2.80 cm Ao Asc diam:  2.80 cm MITRAL VALVE MV Area (PHT): 4.21 cm     SHUNTS MV Decel Time: 180 msec     Systemic VTI:  0.26 m MV E velocity: 90.30 cm/s   Systemic Diam: 1.90 cm MV A velocity: 100.00 cm/s MV E/A ratio:  0.90 Arvilla Meres MD Electronically signed by Arvilla Meres MD Signature Date/Time: 01/26/2024/4:20:49 PM    Final    CT Angio Chest PE W/Cm &/Or Wo Cm Result Date: 01/26/2024 CLINICAL DATA:  Positive D-dimer. Cough and sore throat. Concern for pulmonary embolism. EXAM: CT ANGIOGRAPHY CHEST WITH CONTRAST TECHNIQUE: Multidetector CT imaging of the chest was performed using the standard protocol during bolus administration of intravenous contrast. Multiplanar CT image reconstructions and MIPs were obtained to evaluate the vascular anatomy. RADIATION DOSE REDUCTION: This exam was performed according to the departmental dose-optimization program which includes automated exposure control, adjustment of the mA and/or kV according to patient size and/or use of iterative reconstruction technique. CONTRAST:  75mL OMNIPAQUE IOHEXOL 350 MG/ML SOLN COMPARISON:  Chest radiograph dated 01/26/2024. FINDINGS: Cardiovascular: There is no cardiomegaly. There is 3 vessel coronary vascular calcification. Small pericardial effusion. Mild atherosclerotic calcification of the thoracic aorta. No aneurysmal dilatation or dissection. The origins of the great vessels of the aortic arch appear patent. There is mild dilatation of the main pulmonary trunk suggestive of pulmonary hypertension. Evaluation of the pulmonary arteries is limited due to suboptimal opacification and timing of the contrast. No central pulmonary artery embolus identified. Mediastinum/Nodes: No hilar adenopathy. The esophagus is grossly unremarkable. No mediastinal fluid collection. Lungs/Pleura: Left lung base scarring and postsurgical changes. Rounded subpleural consolidation in the superior segment of the left  lower lobe likely an area of round atelectasis. Pneumonia or underlying mass is not excluded. Direct comparison with prior CT images, if available, or follow-up recommended to document stability/resolution. No pleural effusion or pneumothorax. The central airways are patent. Upper Abdomen: No acute abnormality. Musculoskeletal: Renal osteodystrophy.  No acute osseous pathology. Review of the MIP images confirms the above findings. IMPRESSION: 1. No central pulmonary artery embolus identified. 2. Left lung base scarring and  postsurgical changes. Rounded subpleural consolidation in the superior segment of the left lower lobe likely an area of round atelectasis. Pneumonia or underlying mass is not excluded. Direct comparison with prior CT images, if available, or follow-up recommended to document stability/resolution. 3.  Aortic Atherosclerosis (ICD10-I70.0). Electronically Signed   By: Elgie Collard M.D.   On: 01/26/2024 11:19   DG Chest 2 View Result Date: 01/26/2024 CLINICAL DATA:  Chest pain, back pain and sore throat. EXAM: CHEST - 2 VIEW COMPARISON:  Portable chest 08/06/2022 FINDINGS: There is mild cardiomegaly but the heart is less enlarged than previously. Mild central vascular prominence is present but the interstitial edema noted previously is not seen today. The mediastinum is normally outlined. The lungs are clear of infiltrates. There is increased linear scarring or atelectasis in the left base. There is no substantial pleural effusion.  Thoracic cage is intact. IMPRESSION: 1. Mild cardiomegaly and central vascular prominence. The heart is less enlarged than previously. No overt edema. 2. Increased linear scarring or atelectasis in the left base. No focal pneumonic infiltrate. Electronically Signed   By: Almira Bar M.D.   On: 01/26/2024 06:14     Discharge Instructions: Discharge Instructions     Amb Referral to Cardiac Rehabilitation   Complete by: As directed    Diagnosis:   NSTEMI PTCA Coronary Stents     After initial evaluation and assessments completed: Virtual Based Care may be provided alone or in conjunction with Phase 2 Cardiac Rehab based on patient barriers.: Yes   Intensive Cardiac Rehabilitation (ICR) [facility] location only OR Traditional Cardiac Rehabilitation (TCR) *If criteria for ICR are not met will enroll in TCR [facility] only): Yes   Call MD for:  difficulty breathing, headache or visual disturbances   Complete by: As directed    Call MD for:  extreme fatigue   Complete by: As directed    Call MD for:  hives   Complete by: As directed    Call MD for:  persistant dizziness or light-headedness   Complete by: As directed    Call MD for:  persistant nausea and vomiting   Complete by: As directed    Call MD for:  redness, tenderness, or signs of infection (pain, swelling, redness, odor or green/yellow discharge around incision site)   Complete by: As directed    Call MD for:  severe uncontrolled pain   Complete by: As directed    Call MD for:  temperature >100.4   Complete by: As directed    Diet - low sodium heart healthy   Complete by: As directed    Discharge instructions   Complete by: As directed    You came to the hospital for back pain and you were diagnosed with a heart attack .  We treated you with placing stents in the arteries to open them back up.   *For your Heart Attack (NSTEMI) -We have started you on these following medications:  -Aspirin 81 mg , take one tablet daily  -Lipitor 40mg , take one every day  -Brilinta 90 mg, take one tablet twice a day   -Metoprolol tartrate (Lopressor) 25mg  once a day  -We have stopped the following medications:  -Amlodipine    *Continue getting insulin through your insulin pump -If your sugars are below 70, please call your PCP or endocrinologist   -Please see your PCP in 7 to 10 days -Please see the cardiologist on March 14th at 8:25 am    Follow-up appointments: Please visit your  family  doctor in 7 to 10 days Please visit the heart doctor (cardiologist on March 14th) Continue to go to the scheduled dialysis sessions   If you have any questions or concerns please feel free to call: Internal medicine clinic at [phone]   If you have any of these following symptoms, please call us or seek care at an emergency department: -Chest Pain -Difficulty Breathing -Worsening chest/back  pain -Syncope (passing out) -Drooping of face -Slurred speech -Sudden weakness in your leg or arm -Fever -Chills   We are glad that you are feeling better, it was a pleasure to care for you!  Faith Rogue DO   Increase activity slowly   Complete by: As directed        Signed: Faith Rogue DO [facility] - PGY1 Pager: [phone] 01/28/2024, 2:01 PM    Please contact the on call pager after 5 pm and on weekends at [phone].

## 2024-01-28 NOTE — Telephone Encounter (Signed)
 Patient Product/process development scientist completed.    The patient is insured through [employer]. Patient has Medicare and is not eligible for a copay card, but may be able to apply for patient assistance or Medicare RX Payment Plan (Patient Must reach out to their plan, if eligible for payment plan), if available.    Ran test claim for Brilinta 90 mg and the current 30 day co-pay is $0.00.   This test claim was processed through [facility]- copay amounts may vary at other pharmacies due to pharmacy/plan contracts, or as the patient moves through the different stages of their insurance plan.     Roland Earl, CPHT Pharmacy Technician III Certified Patient Advocate [facility] Pharmacy Patient Advocate Team Direct Number: [phone]  Fax: [phone]

## 2024-01-28 NOTE — Care Management (Signed)
 01-28-24 1336 Benefits check completed for Brilinta. Co pay is zero dollars. No further needs identified at this time.

## 2024-01-28 NOTE — Progress Notes (Signed)
 Advised of pt's d/c today by attending and nephrologist. Contacted [facility] and spoke to RN. Clinic aware pt will d/c today and should resume on Friday. Clinic has access to [facility] and can obtain needed clinicals for continuation of care.   Olivia Canter Renal Navigator [phone]

## 2024-01-28 NOTE — Telephone Encounter (Signed)
 Spoke with patient and she has not left the hospital yet.

## 2024-01-28 NOTE — Progress Notes (Addendum)
 Came to educate pt regarding MI however she is having right shoulder pain and not up to education. Left MI book with education videos to view. She is wheelchair bound but could potentially do CRPII. Will place referral to [place] CRPII.  [id number] Ethelda Chick BS, ACSM-CEP 01/28/2024 12:35 PM  Discussed with pt MI, stent, importance of Brilinta, restrictions, DM diet, and CRPII. Pt receptive. She does HD MWF am so it would be difficult to do CRPII however placed referral to [place] [facility]. If she changes her HD time to later it would be easier to do program.  [id number] Ethelda Chick BS, ACSM-CEP 01/28/2024 3:17 PM

## 2024-01-28 NOTE — Progress Notes (Signed)
 Progress Note  Patient Name: Karen [name] Date of Encounter: 01/28/2024  [facility] Cardiologist: None   Patient Profile     Subjective   Cath yesterday showed 20% proximal to mid RCA, 20% ostial left circumflex to proximal left circumflex, 99% distal left circumflex, 99% mid LAD and 30% mid to distal LAD status post PCI of the mid LAD.  The distal circumflex was too small for PCI.  Started on DAPT with aspirin and Brilinta.  Denies any chest pain or shortness of breath today.  Inpatient Medications    Scheduled Meds:  acetaminophen  1,000 mg Oral TID   aspirin EC  81 mg Oral Daily   atorvastatin  40 mg Oral Daily   cinacalcet  30 mg Oral Q M,W,F   gabapentin  100 mg Oral QHS   gabapentin  200 mg Oral Daily   insulin aspart  0-5 Units Subcutaneous QHS   insulin aspart  0-6 Units Subcutaneous TID WC   lanthanum  2,000 mg Oral TID WC   lidocaine  1 patch Transdermal Q24H   metoprolol tartrate  12.5 mg Oral BID   multivitamin  1 tablet Oral q AM   sodium chloride flush  3 mL Intravenous Q12H   sodium zirconium cyclosilicate  10 g Oral Daily   ticagrelor  90 mg Oral BID   Continuous Infusions:  sodium chloride     anticoagulant sodium citrate     PRN Meds: sodium chloride, albuterol, alteplase, anticoagulant sodium citrate, cyclobenzaprine, lanthanum, midodrine, sodium chloride flush   Vital Signs    Vitals:   01/27/24 2140 01/28/24 0056 01/28/24 0057 01/28/24 0711  BP: (!) 143/79  123/62 (!) 151/78  Pulse: 85   85  Resp:   16 16  Temp:   98.6 F (37 C) 98.3 F (36.8 C)  TempSrc:   Oral Oral  SpO2:    98%  Weight:  89.4 kg    Height:        Intake/Output Summary (Last 24 hours) at 01/28/2024 0723 Last data filed at 01/27/2024 1205 Gross per 24 hour  Intake --  Output 3500 ml  Net -3500 ml      01/28/2024   12:56 AM 01/27/2024   12:05 PM 01/27/2024    8:48 AM  Last 3 Weights  Weight (lbs) 197 lb 3.2 oz 199 lb 8.3 oz 207 lb 0.2 oz   Weight (kg) 89.449 kg 90.5 kg 93.9 kg      Telemetry    NSR - Personally Reviewed  ECG    No new EKG to review - Personally Reviewed  Physical Exam   GEN: No acute distress.   Neck: No JVD Cardiac: RRR, no murmurs, rubs, or gallops.  Respiratory: Clear to auscultation bilaterally. GI: Soft, nontender, non-distended  MS: No edema; No deformity. Neuro:  Nonfocal  Psych: Normal affect   Labs    High Sensitivity Troponin:   Recent Labs  Lab 01/26/24 1855 01/26/24 2202 01/27/24 0021 01/27/24 0245 01/27/24 0415  TROPONINIHS 2,148* 2,942* 3,118* 3,167* 3,247*      Chemistry Recent Labs  Lab 01/27/24 0245 01/27/24 0415 01/27/24 0420 01/27/24 0635 01/27/24 2051  NA 133* 135 133*  --  130*  K 6.8* 6.6* 6.5* 5.0 5.7*  CL 97* 101 102  --  93*  CO2 22 22  --   --  25  GLUCOSE 121* 130* 119*  --  162*  BUN 68* 67* 70*  --  38*  CREATININE 9.90*  9.66* 10.50*  --  5.99*  CALCIUM 8.5* 8.3*  --   --  8.8*  PROT 5.7*  --   --   --   --   ALBUMIN 2.8* 2.8*  --   --  3.0*  AST 26  --   --   --   --   ALT 23  --   --   --   --   ALKPHOS 186*  --   --   --   --   BILITOT 0.8  --   --   --   --   GFRNONAA 4* 5*  --   --  8*  ANIONGAP 14 12  --   --  12     Hematology Recent Labs  Lab 01/26/24 0539 01/27/24 0420 01/27/24 0813  WBC 8.7  --  9.5  RBC 3.37*  --  2.98*  HGB 10.4* 9.2* 9.2*  HCT 32.6* 27.0* 28.7*  MCV 96.7  --  96.3  MCH 30.9  --  30.9  MCHC 31.9  --  32.1  RDW 13.2  --  13.2  PLT 219  --  188    BNPNo results for input(s): "BNP", "PROBNP" in the last 168 hours.   DDimer  Recent Labs  Lab 01/26/24 0744  DDIMER 0.72*     Radiology    CARDIAC CATHETERIZATION Result Date: 01/27/2024   Prox RCA to Mid RCA lesion is 20% stenosed.   Ost Cx to Prox Cx lesion is 20% stenosed.   Dist Cx lesion is 99% stenosed.   Mid LAD lesion is 99% stenosed.   Mid LAD to Dist LAD lesion is 30% stenosed.   A drug-eluting stent was successfully placed using a  SYNERGY XD [id number].   Post intervention, there is a 0% residual stenosis. Severe, heavily calcified stenosis in the mid LAD. Successful PTCA/orbital atherectomy/DES x 1 mid LAD Severe stenosis in the small caliber distal Circumflex. Vessel too small for PCI Large dominant RCA with heavy calcification throughout the proximal and mid vessel with mild diffuse stenosis. LVEDP=18 mmHg Recommendations: Will continue DAPT with ASA and Brilinta for one year. High intensity statin. Medical management of the disease in the small caliber distal Circumflex.   ECHOCARDIOGRAM COMPLETE Result Date: 01/26/2024    ECHOCARDIOGRAM REPORT   Patient Name:   [facility] Date of Exam: 01/26/2024 Medical Rec #:  [id number]             Height:       65.0 in Accession #:    [id number]            Weight:       190.0 lb Date of Birth:  [birth year]             BSA:          1.935 m Patient Age:    47 years              BP:           104/52 mmHg Patient Gender: F                     HR:           74 bpm. Exam Location:  Inpatient Procedure: 2D Echo and 3D Echo (Both Spectral and Color Flow Doppler were            utilized during procedure). Indications:    Chest pain  History:  Patient has no prior history of Echocardiogram examinations.                 ESRD, Signs/Symptoms:Chest Pain; Risk Factors:Hypertension,                 Diabetes, Sleep Apnea and Former Smoker. H/o endocarditis.  Sonographer:    Dondra Prader RVT RCS Referring Phys: EMILY DEAN IMPRESSIONS  1. Left ventricular ejection fraction, by estimation, is 40 to 45%. The left ventricle has mildly decreased function. The left ventricle demonstrates regional wall motion abnormalities (see scoring diagram/findings for description). There is mild concentric left ventricular hypertrophy. Left ventricular diastolic parameters are consistent with Grade I diastolic dysfunction (impaired relaxation).  2. Right ventricular systolic function is normal. The right ventricular size  is normal.  3. Left atrial size was mildly dilated.  4. The mitral valve is degenerative. Mild mitral valve regurgitation. No evidence of mitral stenosis.  5. The aortic valve is normal in structure. There is moderate calcification of the aortic valve. Aortic valve regurgitation is trivial. Aortic valve sclerosis/calcification is present, without any evidence of aortic stenosis. Aortic valve area, by VTI measures 2.08 cm. Aortic valve mean gradient measures 5.5 mmHg. Aortic valve Vmax measures 1.63 m/s.  6. The inferior vena cava is dilated in size with >50% respiratory variability, suggesting right atrial pressure of 8 mmHg. FINDINGS  Left Ventricle: Left ventricular ejection fraction, by estimation, is 40 to 45%. The left ventricle has mildly decreased function. The left ventricle demonstrates regional wall motion abnormalities. Strain imaging was not performed. The left ventricular  internal cavity size was normal in size. There is mild concentric left ventricular hypertrophy. Left ventricular diastolic parameters are consistent with Grade I diastolic dysfunction (impaired relaxation).  LV Wall Scoring: The mid inferoseptal segment, apical septal segment, and apical inferior segment are akinetic. The apex is hypokinetic. Right Ventricle: The right ventricular size is normal. No increase in right ventricular wall thickness. Right ventricular systolic function is normal. Left Atrium: Left atrial size was mildly dilated. Right Atrium: Right atrial size was normal in size. Pericardium: There is no evidence of pericardial effusion. Mitral Valve: The mitral valve is degenerative in appearance. Mild mitral valve regurgitation. No evidence of mitral valve stenosis. Tricuspid Valve: The tricuspid valve is normal in structure. Tricuspid valve regurgitation is mild . No evidence of tricuspid stenosis. Aortic Valve: The aortic valve is normal in structure. There is moderate calcification of the aortic valve. Aortic valve  regurgitation is trivial. Aortic valve sclerosis/calcification is present, without any evidence of aortic stenosis. Aortic valve mean  gradient measures 5.5 mmHg. Aortic valve peak gradient measures 10.6 mmHg. Aortic valve area, by VTI measures 2.08 cm. Pulmonic Valve: The pulmonic valve was normal in structure. Pulmonic valve regurgitation is mild. No evidence of pulmonic stenosis. Aorta: The aortic root is normal in size and structure. Venous: The inferior vena cava is dilated in size with greater than 50% respiratory variability, suggesting right atrial pressure of 8 mmHg. IAS/Shunts: No atrial level shunt detected by color flow Doppler. Additional Comments: 3D was performed not requiring image post processing on an independent workstation and was abnormal.  LEFT VENTRICLE PLAX 2D LVIDd:         5.10 cm   Diastology LVIDs:         3.75 cm   LV e' medial:    6.16 cm/s LV PW:         1.35 cm   LV E/e' medial:  14.7 LV IVS:  1.00 cm   LV e' lateral:   7.62 cm/s LVOT diam:     1.90 cm   LV E/e' lateral: 11.9 LV SV:         74 LV SV Index:   38 LVOT Area:     2.84 cm                           3D Volume EF:                          3D EF:        48 %                          LV EDV:       151 ml                          LV ESV:       78 ml                          LV SV:        73 ml RIGHT VENTRICLE             IVC RV Basal diam:  4.20 cm     IVC diam: 2.20 cm RV S prime:     10.40 cm/s TAPSE (M-mode): 2.0 cm LEFT ATRIUM             Index        RIGHT ATRIUM           Index LA diam:        3.90 cm 2.02 cm/m   RA Area:     14.00 cm LA Vol (A2C):   48.4 ml 25.01 ml/m  RA Volume:   34.90 ml  18.03 ml/m LA Vol (A4C):   57.2 ml 29.55 ml/m LA Biplane Vol: 52.9 ml 27.33 ml/m  AORTIC VALVE                     PULMONIC VALVE AV Area (Vmax):    2.02 cm      PV Vmax:          0.95 m/s AV Area (Vmean):   2.03 cm      PV Peak grad:     3.6 mmHg AV Area (VTI):     2.08 cm      PR End Diast Vel: 3.40 msec AV Vmax:            163.00 cm/s AV Vmean:          111.500 cm/s AV VTI:            0.355 m AV Peak Grad:      10.6 mmHg AV Mean Grad:      5.5 mmHg LVOT Vmax:         116.00 cm/s LVOT Vmean:        79.800 cm/s LVOT VTI:          0.260 m LVOT/AV VTI ratio: 0.73  AORTA Ao Root diam: 2.80 cm Ao Asc diam:  2.80 cm MITRAL VALVE MV Area (PHT): 4.21 cm     SHUNTS MV Decel Time: 180 msec     Systemic VTI:  0.26 m MV E velocity: 90.30 cm/s   Systemic Diam: 1.90  cm MV A velocity: 100.00 cm/s MV E/A ratio:  0.90 Arvilla Meres MD Electronically signed by Arvilla Meres MD Signature Date/Time: 01/26/2024/4:20:49 PM    Final    CT Angio Chest PE W/Cm &/Or Wo Cm Result Date: 01/26/2024 CLINICAL DATA:  Positive D-dimer. Cough and sore throat. Concern for pulmonary embolism. EXAM: CT ANGIOGRAPHY CHEST WITH CONTRAST TECHNIQUE: Multidetector CT imaging of the chest was performed using the standard protocol during bolus administration of intravenous contrast. Multiplanar CT image reconstructions and MIPs were obtained to evaluate the vascular anatomy. RADIATION DOSE REDUCTION: This exam was performed according to the departmental dose-optimization program which includes automated exposure control, adjustment of the mA and/or kV according to patient size and/or use of iterative reconstruction technique. CONTRAST:  75mL OMNIPAQUE IOHEXOL 350 MG/ML SOLN COMPARISON:  Chest radiograph dated 01/26/2024. FINDINGS: Cardiovascular: There is no cardiomegaly. There is 3 vessel coronary vascular calcification. Small pericardial effusion. Mild atherosclerotic calcification of the thoracic aorta. No aneurysmal dilatation or dissection. The origins of the great vessels of the aortic arch appear patent. There is mild dilatation of the main pulmonary trunk suggestive of pulmonary hypertension. Evaluation of the pulmonary arteries is limited due to suboptimal opacification and timing of the contrast. No central pulmonary artery embolus identified.  Mediastinum/Nodes: No hilar adenopathy. The esophagus is grossly unremarkable. No mediastinal fluid collection. Lungs/Pleura: Left lung base scarring and postsurgical changes. Rounded subpleural consolidation in the superior segment of the left lower lobe likely an area of round atelectasis. Pneumonia or underlying mass is not excluded. Direct comparison with prior CT images, if available, or follow-up recommended to document stability/resolution. No pleural effusion or pneumothorax. The central airways are patent. Upper Abdomen: No acute abnormality. Musculoskeletal: Renal osteodystrophy.  No acute osseous pathology. Review of the MIP images confirms the above findings. IMPRESSION: 1. No central pulmonary artery embolus identified. 2. Left lung base scarring and postsurgical changes. Rounded subpleural consolidation in the superior segment of the left lower lobe likely an area of round atelectasis. Pneumonia or underlying mass is not excluded. Direct comparison with prior CT images, if available, or follow-up recommended to document stability/resolution. 3.  Aortic Atherosclerosis (ICD10-I70.0). Electronically Signed   By: Elgie Collard M.D.   On: 01/26/2024 11:19    Patient Profile     48 y.o. 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 Rosalia Hammers.   Assessment & Plan    NSTEMI (possibly type II in the setting of GI illness) Back pain -- Presented with pain across her upper back along with GI illness and sore throat.  Reported a " jolt like" sensation in her anterior chest. -- hsTn 372>>612>> peak of 3247, EKG with no acute ST/T wave abnormalities but 2D echo with focal wall motion abnormalities -- CT angio chest negative for PE but did note three-vessel coronary calcification -- 2D echo showed EF 40 to 45% with mild LVH, G1 DD, mild MR with  mid inferior septal, apical septal and apical inferior akinesis -- Cath yesterday showed 20% proximal to mid RCA, 20% ostial left circumflex to proximal left circumflex, 99% distal left circumflex, 99% mid LAD and 30% mid to distal LAD status post PCI of the mid LAD.  The distal circumflex was too small for PCI.  --Continue aspirin 81 mg daily, Brilinta 90 mg twice daily, atorvastatin 40 mg daily --Change Lopressor  to Toprol XL 25 mg daily    GI/viral illness -- Reported episodes of diarrhea on Wednesday and Saturday/Sunday.  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   -- Will consolidate Lopressor to Toprol XL 25 mg daily    S/p VATS with left lower lobectomy '21 Diabetes Fibromyalgia Wheelchair dependent    I spent 30 minutes caring for this patient today face to face, ordering and reviewing labs, reviewing records from 2D echo, chest CTA from this admission , seeing the patient, documenting in the record, reviewing Cardiac cath findings  [facility] will sign off.   Medication Recommendations:  Aspirin 81 mg daily, Brilinta 90 mg twice daily, atorvastatin 40 mg daily, Toprol XL 25 mg daily Other recommendations (labs, testing, etc): Fasting lipid panel and ALT in 6 weeks Follow up as an outpatient: 2-week TOC follow-up   For questions or updates, please contact [facility] Please consult [email or web address] for contact info under        Signed, Armanda Magic, MD  01/28/2024, 7:23 AM

## 2024-01-28 NOTE — Telephone Encounter (Signed)
   Transition of Care Follow-up Phone Call Request    Patient Name: Karen [name] Date of Birth: 18-Apr-1976 Date of Encounter: 01/28/2024  Primary Care Provider:  Loura Pardon, PA Primary Cardiologist:  None  Karen [name] has been scheduled for a transition of care follow up appointment with a [facility] provider:  Friday Feb 13, 2024  Arrive by 8:10 AMAppt at 8:25 AM (25 min)   Karen [name]   Please also arrange to have a fasting lipid and CMP performed in about 6 weeks.  Please reach out to [facility] within 48 hours of discharge to confirm appointment and review transition of care protocol questionnaire. Anticipated discharge date: 1-2 days   Karen Kitchens, PA-C  01/28/2024, 7:43 AM

## 2024-01-28 NOTE — Progress Notes (Signed)
 Received patient in bed to unit.  Alert and oriented.  Informed consent signed and in chart.   TX duration:3hr24min  Patient tolerated well. - tx terminated early per pt request for c/o R shoulder pain Transported back to the room  Alert, without acute distress.  Hand-off given to patient's nurse.   Access used: LAVF Access issues: none  Total UF removed: 2 Medication(s) given: none   01/28/24 1207  Vitals  Temp 98 F (36.7 C)  BP (!) 142/66  MAP (mmHg) 98  Pulse Rate 84  ECG Heart Rate 86  Oxygen Therapy  SpO2 90 %  During Treatment Monitoring  Blood Flow Rate (mL/min) 400 mL/min  Arterial Pressure (mmHg) -149.08 mmHg  Venous Pressure (mmHg) 201.61 mmHg  TMP (mmHg) -1.82 mmHg  Ultrafiltration Rate (mL/min) 0 mL/min  Dialysate Flow Rate (mL/min) 300 ml/min  Dialysate Potassium Concentration 2  Dialysate Calcium Concentration 2.5  Duration of HD Treatment -hour(s) 3.42 hour(s)  Cumulative Fluid Removed (mL) per Treatment  2043.25  Intra-Hemodialysis Comments Tx completed  Dialysis Fluid Bolus Normal Saline  Bolus Amount (mL) 300 mL  Post Treatment  Dialyzer Clearance Clear  Liters Processed 84  Fluid Removed (mL) 2000 mL  Tolerated HD Treatment Yes  AVG/AVF Arterial Site Held (minutes) 5 minutes  AVG/AVF Venous Site Held (minutes) 5 minutes  Fistula / Graft Left Forearm Arteriovenous fistula  Placement Date/Time: (c) 08/06/22 1343   Placed prior to admission: Yes  Orientation: Left  Access Location: Forearm  Access Type: Arteriovenous fistula  Site Condition No complications  Fistula / Graft Assessment Present;Thrill;Bruit  Status Deaccessed;Flushed      Freddi Starr, RN [facility]

## 2024-01-28 NOTE — Progress Notes (Addendum)
 Subjective: Karen [name] is a 48 y.o. F with pertinent PMH of ESRD on MWF HD, HTN, asthma, fibromyalgia who presented with radiating back pain and sore throat admitted for an NSTEMI.   Today, her back and chest pain have resolved. Patient no longer endorses nausea. She is feeling better but still feels a bit fatigued. She would like to talk after HD to see if she is ready to go home. We spoke about the medication changes that we will be making.   Objective:  Vital signs in last 24 hours: Vitals:   01/28/24 0837 01/28/24 0900 01/28/24 0930 01/28/24 1000  BP: (!) 145/68 (!) 144/94 (!) 109/57 109/73  Pulse: 88 86 88 81  Resp:      Temp:      TempSrc:      SpO2: 100% 100% 100% 100%  Weight:      Height:       Physical Exam: General:NAD, currently in HD, NAD Cardiac:RRR Pulmonary:normal effort on room air, LUE AVF is currently in use at HD  Abdominal:soft, non-tender  Neuro:awake, alert, participating in conversation  MSK:No pitting edema appreciated  Skin:warm and dry  Psych:  normal mood, pleasant affect      Latest Ref Rng & Units 01/28/2024    9:38 AM 01/27/2024    8:13 AM 01/27/2024    4:20 AM  CBC  WBC 4.0 - 10.5 K/uL 7.1  9.5    Hemoglobin 12.0 - 15.0 g/dL 9.0  9.2  9.2   Hematocrit 36.0 - 46.0 % 26.9  28.7  27.0   Platelets 150 - 400 K/uL 208  188         Latest Ref Rng & Units 01/28/2024    9:38 AM 01/27/2024    8:51 PM 01/27/2024    6:35 AM  BMP  Glucose 70 - 99 mg/dL 409  811    BUN 6 - 20 mg/dL 45  38    Creatinine 9.14 - 1.00 mg/dL 7.82  9.56    Sodium 213 - 145 mmol/L 129  130    Potassium 3.5 - 5.1 mmol/L 4.8  5.7  5.0   Chloride 98 - 111 mmol/L 94  93    CO2 22 - 32 mmol/L 23  25    Calcium 8.9 - 10.3 mg/dL 8.7  8.8      Troponin: 3,247  Echocardiogram: LVEF of 40-45%, region wall motion abnormalities   Assessment/Plan:  Principal Problem:   NSTEMI (non-ST elevated myocardial infarction) (HCC) Active Problems:   ESRD on hemodialysis  (HCC)   History of endocarditis   Essential hypertension   Mild intermittent asthma without complication   Chest pain   Elevated troponin  NSTEMI 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 Echocardiogram revealed regional wall motion abnormalities. PCI revealed a distal circumflex lesion that was 99% stenosed but vessel was too small for stent placement. She also had a mid LAD lesion that was 99% stenosis and a drug eluting stent was placed. She was started on Brilinta and metoprolol in addition to aspirin and Lipitor. She remains hemodynamically stable.  Plan: -Cardiology signed off  -Heparin drip d/c  -Continue ASA, lipitor, Brilinta, and metoprolol -Avoid morphine for pain due to ESRD -May use dilaudid if needed, please reach out if patient is having chest pain/worsening back pain  -Continue telemetry   ESRD Hyperkalemia  Patient receives HD every MWF. She  is reciving HD at this moment.    Plan: -Nephrology consulted, appreciate their recommendations with HD    T2DM Patient is on an insulin pump at home.  Plan: -Continue  SSI   HTN On amlodipine 10 mg daily at home; hydralazine is reportedly PRN. Plan: -Continue metoprolol 25 mg    Fibromyalgia Plan: -Continue home gabapentin 200 mg each morning and 100 mg each evening   Asthma No respiratory distress, stable on RA. No concern for acute exacerbation. Plan: -Albuterol 1 puff q6h PRN wheezing, shortness of breath  Resolved Problems:  __________________________________  Code Status: Full VTE Prophylaxis:SCD Diet:Carb modified  IVF:N/A Barriers to Discharge:Medical evaluation for NSTEMI Dispo: Anticipated discharge in approximately 1-2 day(s).   Faith Rogue, DO 01/28/2024, 10:34 AM Pager: [phone] After 5pm on weekdays and 1pm on weekends: On Call pager [phone]

## 2024-01-28 NOTE — Progress Notes (Addendum)
 [facility] Progress Note   Subjective: Seen on HD. Looks wonderful. No C/O back pain.  S/P drug-eluting stent to LAD per Dr.McAlhany 01/27/2024. Has 99% L circ lesion will be treated medically. HD today on schedule.   Possible stenosis/decreased bruit lower portion of AVF. She reports increased bleeding at [facility] and is contacting her own [facility]VS person in [place]  Objective Vitals:   01/28/24 0755 01/28/24 0800 01/28/24 0815 01/28/24 0837  BP:   104/87 (!) 145/68  Pulse: 86 88 89   Resp:      Temp:      TempSrc:      SpO2: 94% 95% 100%   Weight:      Height:       General: Chronically ill appearing female in no acute distress. Neuro: A & O X 3 Lungs: CTAB A/P. No WOB Heart: S1,S2 2/6 systolic M.  Abdomen: NABS, obese.  Lower extremities: No LE  Neuro: Alert and oriented X 3. Moves all extremities spontaneously. Dialysis Access: L AVF + T/B:  Additional Objective Labs: Basic Metabolic Panel: Recent Labs  Lab 01/27/24 0245 01/27/24 0415 01/27/24 0420 01/27/24 0635 01/27/24 2051  NA 133* 135 133*  --  130*  K 6.8* 6.6* 6.5* 5.0 5.7*  CL 97* 101 102  --  93*  CO2 22 22  --   --  25  GLUCOSE 121* 130* 119*  --  162*  BUN 68* 67* 70*  --  38*  CREATININE 9.90* 9.66* 10.50*  --  5.99*  CALCIUM 8.5* 8.3*  --   --  8.8*  PHOS  --  7.1*  --   --  5.6*   Liver Function Tests: Recent Labs  Lab 01/27/24 0245 01/27/24 0415 01/27/24 2051  AST 26  --   --   ALT 23  --   --   ALKPHOS 186*  --   --   BILITOT 0.8  --   --   PROT 5.7*  --   --   ALBUMIN 2.8* 2.8* 3.0*   No results for input(s): "LIPASE", "AMYLASE" in the last 168 hours. CBC: Recent Labs  Lab 01/26/24 0539 01/27/24 0420 01/27/24 0813  WBC 8.7  --  9.5  HGB 10.4* 9.2* 9.2*  HCT 32.6* 27.0* 28.7*  MCV 96.7  --  96.3  PLT 219  --  188   Blood Culture    Component Value Date/Time   SDES BLOOD RIGHT ARM 12/10/2021 2127   SPECREQUEST  12/10/2021 2127    BOTTLES DRAWN AEROBIC  AND ANAEROBIC Blood Culture results may not be optimal due to an excessive volume of blood received in culture bottles   CULT  12/10/2021 2127    NO GROWTH 5 DAYS Performed at [facility], [address] [place], [place] [address]    REPTSTATUS 12/15/2021 FINAL 12/10/2021 2127    Cardiac Enzymes: No results for input(s): "CKTOTAL", "CKMB", "CKMBINDEX", "TROPONINI" in the last 168 hours. CBG: Recent Labs  Lab 01/27/24 0540 01/27/24 1610 01/27/24 2209 01/28/24 0107 01/28/24 0712  GLUCAP 159* 214* 179* 226* 171*   Iron Studies: No results for input(s): "IRON", "TIBC", "TRANSFERRIN", "FERRITIN" in the last 72 hours. @lablastinr3 @ Studies/Results: CARDIAC CATHETERIZATION Result Date: 01/27/2024   Prox RCA to Mid RCA lesion is 20% stenosed.   Ost Cx to Prox Cx lesion is 20% stenosed.   Dist Cx lesion is 99% stenosed.   Mid LAD lesion is 99% stenosed.   Mid LAD to  Dist LAD lesion is 30% stenosed.   A drug-eluting stent was successfully placed using a SYNERGY XD 2.50X24.   Post intervention, there is a 0% residual stenosis. Severe, heavily calcified stenosis in the mid LAD. Successful PTCA/orbital atherectomy/DES x 1 mid LAD Severe stenosis in the small caliber distal Circumflex. Vessel too small for PCI Large dominant RCA with heavy calcification throughout the proximal and mid vessel with mild diffuse stenosis. LVEDP=18 mmHg Recommendations: Will continue DAPT with ASA and Brilinta for one year. High intensity statin. Medical management of the disease in the small caliber distal Circumflex.   ECHOCARDIOGRAM COMPLETE Result Date: 01/26/2024    ECHOCARDIOGRAM REPORT   Patient Name:   [facility] Date of Exam: 01/26/2024 Medical Rec #:  [id number]             Height:       65.0 in Accession #:    [id number]            Weight:       190.0 lb Date of Birth:  03/12/1976             BSA:          1.935 m Patient Age:    48 years              BP:           104/52 mmHg Patient Gender: F                      HR:           74 bpm. Exam Location:  Inpatient Procedure: 2D Echo and 3D Echo (Both Spectral and Color Flow Doppler were            utilized during procedure). Indications:    Chest pain  History:        Patient has no prior history of Echocardiogram examinations.                 ESRD, Signs/Symptoms:Chest Pain; Risk Factors:Hypertension,                 Diabetes, Sleep Apnea and Former Smoker. H/o endocarditis.  Sonographer:    Dondra Prader RVT RCS Referring Phys: EMILY DEAN IMPRESSIONS  1. Left ventricular ejection fraction, by estimation, is 40 to 45%. The left ventricle has mildly decreased function. The left ventricle demonstrates regional wall motion abnormalities (see scoring diagram/findings for description). There is mild concentric left ventricular hypertrophy. Left ventricular diastolic parameters are consistent with Grade I diastolic dysfunction (impaired relaxation).  2. Right ventricular systolic function is normal. The right ventricular size is normal.  3. Left atrial size was mildly dilated.  4. The mitral valve is degenerative. Mild mitral valve regurgitation. No evidence of mitral stenosis.  5. The aortic valve is normal in structure. There is moderate calcification of the aortic valve. Aortic valve regurgitation is trivial. Aortic valve sclerosis/calcification is present, without any evidence of aortic stenosis. Aortic valve area, by VTI measures 2.08 cm. Aortic valve mean gradient measures 5.5 mmHg. Aortic valve Vmax measures 1.63 m/s.  6. The inferior vena cava is dilated in size with >50% respiratory variability, suggesting right atrial pressure of 8 mmHg. FINDINGS  Left Ventricle: Left ventricular ejection fraction, by estimation, is 40 to 45%. The left ventricle has mildly decreased function. The left ventricle demonstrates regional wall motion abnormalities. Strain imaging was not performed. The left ventricular  internal cavity size was normal in size. There is  mild  concentric left ventricular hypertrophy. Left ventricular diastolic parameters are consistent with Grade I diastolic dysfunction (impaired relaxation).  LV Wall Scoring: The mid inferoseptal segment, apical septal segment, and apical inferior segment are akinetic. The apex is hypokinetic. Right Ventricle: The right ventricular size is normal. No increase in right ventricular wall thickness. Right ventricular systolic function is normal. Left Atrium: Left atrial size was mildly dilated. Right Atrium: Right atrial size was normal in size. Pericardium: There is no evidence of pericardial effusion. Mitral Valve: The mitral valve is degenerative in appearance. Mild mitral valve regurgitation. No evidence of mitral valve stenosis. Tricuspid Valve: The tricuspid valve is normal in structure. Tricuspid valve regurgitation is mild . No evidence of tricuspid stenosis. Aortic Valve: The aortic valve is normal in structure. There is moderate calcification of the aortic valve. Aortic valve regurgitation is trivial. Aortic valve sclerosis/calcification is present, without any evidence of aortic stenosis. Aortic valve mean  gradient measures 5.5 mmHg. Aortic valve peak gradient measures 10.6 mmHg. Aortic valve area, by VTI measures 2.08 cm. Pulmonic Valve: The pulmonic valve was normal in structure. Pulmonic valve regurgitation is mild. No evidence of pulmonic stenosis. Aorta: The aortic root is normal in size and structure. Venous: The inferior vena cava is dilated in size with greater than 50% respiratory variability, suggesting right atrial pressure of 8 mmHg. IAS/Shunts: No atrial level shunt detected by color flow Doppler. Additional Comments: 3D was performed not requiring image post processing on an independent workstation and was abnormal.  LEFT VENTRICLE PLAX 2D LVIDd:         5.10 cm   Diastology LVIDs:         3.75 cm   LV e' medial:    6.16 cm/s LV PW:         1.35 cm   LV E/e' medial:  14.7 LV IVS:        1.00 cm    LV e' lateral:   7.62 cm/s LVOT diam:     1.90 cm   LV E/e' lateral: 11.9 LV SV:         74 LV SV Index:   38 LVOT Area:     2.84 cm                           3D Volume EF:                          3D EF:        48 %                          LV EDV:       151 ml                          LV ESV:       78 ml                          LV SV:        73 ml RIGHT VENTRICLE             IVC RV Basal diam:  4.20 cm     IVC diam: 2.20 cm RV S prime:     10.40 cm/s TAPSE (M-mode): 2.0 cm LEFT ATRIUM  Index        RIGHT ATRIUM           Index LA diam:        3.90 cm 2.02 cm/m   RA Area:     14.00 cm LA Vol (A2C):   48.4 ml 25.01 ml/m  RA Volume:   34.90 ml  18.03 ml/m LA Vol (A4C):   57.2 ml 29.55 ml/m LA Biplane Vol: 52.9 ml 27.33 ml/m  AORTIC VALVE                     PULMONIC VALVE AV Area (Vmax):    2.02 cm      PV Vmax:          0.95 m/s AV Area (Vmean):   2.03 cm      PV Peak grad:     3.6 mmHg AV Area (VTI):     2.08 cm      PR End Diast Vel: 3.40 msec AV Vmax:           163.00 cm/s AV Vmean:          111.500 cm/s AV VTI:            0.355 m AV Peak Grad:      10.6 mmHg AV Mean Grad:      5.5 mmHg LVOT Vmax:         116.00 cm/s LVOT Vmean:        79.800 cm/s LVOT VTI:          0.260 m LVOT/AV VTI ratio: 0.73  AORTA Ao Root diam: 2.80 cm Ao Asc diam:  2.80 cm MITRAL VALVE MV Area (PHT): 4.21 cm     SHUNTS MV Decel Time: 180 msec     Systemic VTI:  0.26 m MV E velocity: 90.30 cm/s   Systemic Diam: 1.90 cm MV A velocity: 100.00 cm/s MV E/A ratio:  0.90 Arvilla Meres MD Electronically signed by Arvilla Meres MD Signature Date/Time: 01/26/2024/4:20:49 PM    Final    CT Angio Chest PE W/Cm &/Or Wo Cm Result Date: 01/26/2024 CLINICAL DATA:  Positive D-dimer. Cough and sore throat. Concern for pulmonary embolism. EXAM: CT ANGIOGRAPHY CHEST WITH CONTRAST TECHNIQUE: Multidetector CT imaging of the chest was performed using the standard protocol during bolus administration of intravenous contrast.  Multiplanar CT image reconstructions and MIPs were obtained to evaluate the vascular anatomy. RADIATION DOSE REDUCTION: This exam was performed according to the departmental dose-optimization program which includes automated exposure control, adjustment of the mA and/or kV according to patient size and/or use of iterative reconstruction technique. CONTRAST:  75mL OMNIPAQUE IOHEXOL 350 MG/ML SOLN COMPARISON:  Chest radiograph dated 01/26/2024. FINDINGS: Cardiovascular: There is no cardiomegaly. There is 3 vessel coronary vascular calcification. Small pericardial effusion. Mild atherosclerotic calcification of the thoracic aorta. No aneurysmal dilatation or dissection. The origins of the great vessels of the aortic arch appear patent. There is mild dilatation of the main pulmonary trunk suggestive of pulmonary hypertension. Evaluation of the pulmonary arteries is limited due to suboptimal opacification and timing of the contrast. No central pulmonary artery embolus identified. Mediastinum/Nodes: No hilar adenopathy. The esophagus is grossly unremarkable. No mediastinal fluid collection. Lungs/Pleura: Left lung base scarring and postsurgical changes. Rounded subpleural consolidation in the superior segment of the left lower lobe likely an area of round atelectasis. Pneumonia or underlying mass is not excluded. Direct comparison with prior CT images, if available, or follow-up recommended to document stability/resolution. No  pleural effusion or pneumothorax. The central airways are patent. Upper Abdomen: No acute abnormality. Musculoskeletal: Renal osteodystrophy.  No acute osseous pathology. Review of the MIP images confirms the above findings. IMPRESSION: 1. No central pulmonary artery embolus identified. 2. Left lung base scarring and postsurgical changes. Rounded subpleural consolidation in the superior segment of the left lower lobe likely an area of round atelectasis. Pneumonia or underlying mass is not excluded.  Direct comparison with prior CT images, if available, or follow-up recommended to document stability/resolution. 3.  Aortic Atherosclerosis (ICD10-I70.0). Electronically Signed   By: Elgie Collard M.D.   On: 01/26/2024 11:19   Medications:  sodium chloride     anticoagulant sodium citrate      acetaminophen  1,000 mg Oral TID   aspirin EC  81 mg Oral Daily   atorvastatin  40 mg Oral Daily   cinacalcet  30 mg Oral Q M,W,F   gabapentin  100 mg Oral QHS   gabapentin  200 mg Oral Daily   insulin aspart  0-5 Units Subcutaneous QHS   insulin aspart  0-6 Units Subcutaneous TID WC   lanthanum  2,000 mg Oral TID WC   lidocaine  1 patch Transdermal Q24H   metoprolol succinate  25 mg Oral Daily   multivitamin  1 tablet Oral q AM   sodium chloride flush  3 mL Intravenous Q12H   sodium zirconium cyclosilicate  10 g Oral Daily   ticagrelor  90 mg Oral BID   Dialysis Orders: Center: [facility] MWF 4 hrs 200NRe 400/600 85 kg 2.0 K/ 2.5 Ca AVF - No heparin - Epogen 1400 units IV three times per week  Assessment/Plan:  NSTEMI-S/P PTCI LAD 01/27/2024. Will be on DAPT1 year. See KDIGO guidelines regarding starting statins after pt has been on dialysis. Per primary and cards.   ESRD -  MWF. HD today on schedule.   Hyperkalemia-mild. K+ pending today.  Follow labs.   Hypertension/volume  - She remains 4.4 kg above OP EDW. Continue to lower volume as tolerated.   Anemia  - HGB 9.2 Unable to give EPO as ordered-NOT on hospital formulary.  Metabolic bone disease -  No VDRA. 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 Per primary   Medication Issues; Preferred narcotic agents for pain control are hydromorphone, fentanyl, and methadone. Morphine should not be used.  Baclofen should be avoided Avoid oral sodium phosphate and magnesium citrate based laxatives / bowel preps    Rita H. Brown NP-C 01/28/2024, 8:39 AM  [employer] [phone]     Seen and examined independently.  Agree with note and exam as documented above by physician extender and as noted here.  Seen and examined on dialysis.  Procedure supervised.  Blood pressure 130/69 and HR 79.  Tolerating goal.  Left AVF in use.  Got a cath and cardiac stent and feels better today.  No chest pain.  Tired.    Disposition per primary team   Estanislado Emms, MD 01/28/2024  10:02 AM

## 2024-01-28 NOTE — Discharge Instructions (Addendum)
 You came to the hospital for back pain and you were diagnosed with a heart attack .  We treated you with placing stents in the arteries to open them back up.   *For your Heart Attack (NSTEMI) -We have started you on these following medications:  -Aspirin 81 mg , take one tablet daily  -Lipitor 40mg , take one every day  -Brilinta 90 mg, take one tablet twice a day   -Metoprolol tartrate (Lopressor) 25mg  once a day  -We have stopped the following medications:  -Amlodipine   *Continue taking insulin through your insulin pump   -Please see your PCP in 7 to 10 days -Please see the cardiologist on March 14th at 8:25 am    Follow-up appointments: Please visit your family doctor in 7 to 10 days Please visit the heart doctor (cardiologist on March 14th) Continue to go to the scheduled dialysis sessions   If you have any questions or concerns please feel free to call: Internal medicine clinic at [phone]   If you have any of these following symptoms, please call us or seek care at an emergency department: -Chest Pain -Difficulty Breathing -Worsening chest/back  pain -Syncope (passing out) -Drooping of face -Slurred speech -Sudden weakness in your leg or arm -Fever -Chills   We are glad that you are feeling better, it was a pleasure to care for you!  Faith Rogue DO

## 2024-01-29 ENCOUNTER — Telehealth: Payer: Self-pay | Admitting: *Deleted

## 2024-01-29 NOTE — Telephone Encounter (Signed)
 Call from patient's mother staes patient had a Cardiac procedure done a couple of days ago.Karen [name] Has been having a lot of lower back pain. Also is having a lot of pain in the groin area where the incision was done.  Is taking Tylenol with no relief.  Patient was advised to contact the Cardiologist who preformed the procedure off.  Given the office number for Dr. Melene Muller.  Patient's mom to call that office.

## 2024-01-29 NOTE — Telephone Encounter (Signed)
 Patient contacted regarding discharge from [facility] on 01/28/2024.  Patient understands to follow up with provider Robin Searing on 02/13/2024 at  08:25 am at [facility]. Patient understands discharge instructions? Yes Patient understands medications and regiment? Yes Patient understands to bring all medications to this visit? Yes  Ask patient:  Are you enrolled in My Chart Yes

## 2024-02-04 ENCOUNTER — Other Ambulatory Visit: Payer: Self-pay

## 2024-02-04 ENCOUNTER — Encounter (HOSPITAL_COMMUNITY): Payer: Self-pay

## 2024-02-04 ENCOUNTER — Emergency Department (HOSPITAL_COMMUNITY)
Admission: EM | Admit: 2024-02-04 | Discharge: 2024-02-05 | Disposition: A | Discharge status: Home / Self Care | Attending: Emergency Medicine | Admitting: Emergency Medicine

## 2024-02-04 DIAGNOSIS — Z794 Long term (current) use of insulin: Secondary | ICD-10-CM | POA: Insufficient documentation

## 2024-02-04 DIAGNOSIS — Z992 Dependence on renal dialysis: Secondary | ICD-10-CM | POA: Diagnosis not present

## 2024-02-04 DIAGNOSIS — T82838A Hemorrhage of vascular prosthetic devices, implants and grafts, initial encounter: Secondary | ICD-10-CM | POA: Diagnosis present

## 2024-02-04 DIAGNOSIS — Y828 Other medical devices associated with adverse incidents: Secondary | ICD-10-CM | POA: Diagnosis not present

## 2024-02-04 DIAGNOSIS — N186 End stage renal disease: Secondary | ICD-10-CM | POA: Diagnosis not present

## 2024-02-04 DIAGNOSIS — Z7982 Long term (current) use of aspirin: Secondary | ICD-10-CM | POA: Insufficient documentation

## 2024-02-04 DIAGNOSIS — Z79899 Other long term (current) drug therapy: Secondary | ICD-10-CM | POA: Insufficient documentation

## 2024-02-04 DIAGNOSIS — R011 Cardiac murmur, unspecified: Secondary | ICD-10-CM | POA: Diagnosis not present

## 2024-02-04 NOTE — ED Triage Notes (Signed)
 Pt states that she had dialysis today and has intermittent bleeding from fistula since she left. No bleeding at this time and pt has dressing gauze/coban dressing in place.

## 2024-02-05 DIAGNOSIS — T82838A Hemorrhage of vascular prosthetic devices, implants and grafts, initial encounter: Secondary | ICD-10-CM | POA: Diagnosis not present

## 2024-02-05 LAB — BASIC METABOLIC PANEL
Anion gap: 14 (ref 5–15)
BUN: 36 mg/dL — ABNORMAL HIGH (ref 6–20)
CO2: 26 mmol/L (ref 22–32)
Calcium: 9.6 mg/dL (ref 8.9–10.3)
Chloride: 97 mmol/L — ABNORMAL LOW (ref 98–111)
Creatinine, Ser: 5.98 mg/dL — ABNORMAL HIGH (ref 0.44–1.00)
GFR, Estimated: 8 mL/min — ABNORMAL LOW (ref >60)
Glucose, Bld: 184 mg/dL — ABNORMAL HIGH (ref 70–99)
Potassium: 4.1 mmol/L (ref 3.5–5.1)
Sodium: 137 mmol/L (ref 135–145)

## 2024-02-05 LAB — CBC WITH DIFFERENTIAL/PLATELET
Abs Immature Granulocytes: 0.04 10*3/uL (ref 0.00–0.07)
Basophils Absolute: 0.1 10*3/uL (ref 0.0–0.1)
Basophils Relative: 1 %
Eosinophils Absolute: 0.4 10*3/uL (ref 0.0–0.5)
Eosinophils Relative: 4 %
HCT: 27.5 % — ABNORMAL LOW (ref 36.0–46.0)
Hemoglobin: 9.1 g/dL — ABNORMAL LOW (ref 12.0–15.0)
Immature Granulocytes: 0 %
Lymphocytes Relative: 31 %
Lymphs Abs: 2.9 10*3/uL (ref 0.7–4.0)
MCH: 31.7 pg (ref 26.0–34.0)
MCHC: 33.1 g/dL (ref 30.0–36.0)
MCV: 95.8 fL (ref 80.0–100.0)
Monocytes Absolute: 0.9 10*3/uL (ref 0.1–1.0)
Monocytes Relative: 9 %
Neutro Abs: 5.1 10*3/uL (ref 1.7–7.7)
Neutrophils Relative %: 55 %
Platelets: 283 10*3/uL (ref 150–400)
RBC: 2.87 MIL/uL — ABNORMAL LOW (ref 3.87–5.11)
RDW: 13.2 % (ref 11.5–15.5)
WBC: 9.4 10*3/uL (ref 4.0–10.5)
nRBC: 0 % (ref 0.0–0.2)

## 2024-02-05 NOTE — ED Provider Notes (Signed)
 [place] EMERGENCY DEPARTMENT AT [facility] Provider Note   CSN: [id number] Arrival date & time: 02/04/24  2200     History  Chief Complaint  Patient presents with   Vascular Access Problem    Karen [name] is a 48 y.o. female.  The history is provided by the patient.   Karen [name] is a 48 y.o. female who presents to the Emergency Department complaining of fistula problem.  She presents to the emergency department for spontaneous bleeding from her dialysis fistula today.  She has ESRD and dialyzes Monday, Wednesday, Friday.  She had a full session today and return home at 10 AM.  She went to remove the bandage and had persistent bleeding.  She put on multiple bandages today but continued to bleed.  She eventually had her friend come over, who is a Water quality scientist and they tried multiple measures including applying a liquid bandage and she has not had any bleeding since then.  She comes in for assessment due to how much bleeding she had throughout the day.  She does report feeling sleepy and fatigued.  She did have bleeding from her gums 2 days ago.  No fevers, chest pain, nausea, vomiting.  She was recently started on Brilinta twice daily for NSTEMI status post stenting 1 week ago.  She is compliant with her home medications.  She has never previously had issues with bleeding from her access site.    Home Medications Prior to Admission medications   Medication Sig Start Date End Date Taking? Authorizing Provider  albuterol (VENTOLIN HFA) 108 (90 Base) MCG/ACT inhaler Inhale 1-2 puffs into the lungs every 6 (six) hours as needed for wheezing or shortness of breath.    [provider]  amLODipine (NORVASC) 10 MG tablet Take 1 tablet (10 mg total) by mouth daily. Patient taking differently: Take 10 mg by mouth at bedtime. 06/12/18   Osvaldo Shipper, MD  ammonium lactate (AMLACTIN) 12 % cream Apply 1 Application topically as needed for dry skin. 12/11/23    Louann Sjogren, DPM  aspirin EC 81 MG tablet Take 1 tablet (81 mg total) by mouth daily. Swallow whole. 01/28/24   Faith Rogue, DO  atorvastatin (LIPITOR) 40 MG tablet Take 1 tablet (40 mg total) by mouth daily. 01/28/24   Faith Rogue, DO  B Complex-C-Folic Acid (RENA-VITE PO) Take 1 tablet by mouth in the morning.    [provider]  cinacalcet (SENSIPAR) 30 MG tablet Take 30 mg by mouth every Monday, Wednesday, and Friday. After hemodialysis    [provider]  erythromycin with ethanol (THERAMYCIN) 2 % external solution Apply 1 Application topically in the morning. 04/12/21   [provider]  ethyl chloride spray Apply 1 Application topically as needed (prior to dialysis). 09/29/18   [provider]  FOSRENOL 1000 MG PACK Take 1,000-2,000 mg by mouth See admin instructions. Take 2 packets (2000 mg) by mouth with each meal & take 1 packet (1000 mg) by mouth with each snack 01/12/19   [provider]  gabapentin (NEURONTIN) 100 MG capsule Take 100-200 mg by mouth See admin instructions. Take 2 capsules (200 mg) by mouth every morning & take 1 capsule (100 mg) by mouth at bedtime. 03/12/21   [provider]  glucagon 1 MG injection Inject 1 mg into the muscle once as needed (low blood sugar). 06/16/18   [provider]  hydrALAZINE (APRESOLINE) 50 MG tablet Take 50 mg by mouth in the morning and at bedtime.  01/08/22   [provider]  insulin aspart (NOVOLOG) 100 UNIT/ML injection Inject 0-10 Units into the skin See admin instructions. Via insulin pump    [provider]  insulin glargine (LANTUS) 100 UNIT/ML Solostar Pen Inject 20 Units into the skin at bedtime. Patient not taking: Reported on 01/26/2024    [provider]  Insulin Pen Needle (BD PEN NEEDLE NANO U/F) 32G X 4 MM MISC Use with insulin 4x/day 12/09/19   [provider]  ketoconazole (NIZORAL) 2 % shampoo Apply 1 Application topically once a  week. 09/03/21   [provider]  lidocaine (LIDODERM) 5 % Place 1 patch onto the skin daily. 11/27/23   [provider]  loperamide (IMODIUM) 2 MG capsule Take 4 mg by mouth as needed for diarrhea or loose stools. 10/15/18   [provider]  metoprolol succinate (TOPROL-XL) 25 MG 24 hr tablet Take 1 tablet (25 mg total) by mouth daily. 01/28/24   Faith Rogue, DO  OZEMPIC, 0.25 OR 0.5 MG/DOSE, 2 MG/3ML SOPN Inject 2 mg into the skin once a week. 01/14/24   [provider]  ticagrelor (BRILINTA) 90 MG TABS tablet Take 1 tablet (90 mg total) by mouth 2 (two) times daily. 01/28/24   Faith Rogue, DO      Allergies    Furosemide, Heparin, and Insulin aspart (human analog)    Review of Systems   Review of Systems  All other systems reviewed and are negative.   Physical Exam Updated Vital Signs BP (!) 117/54   Pulse 77   Temp 98.4 F (36.9 C)   Resp 16   SpO2 100%  Physical Exam Vitals and nursing note reviewed.  Constitutional:      Appearance: She is well-developed.  HENT:     Head: Normocephalic and atraumatic.  Cardiovascular:     Rate and Rhythm: Normal rate and regular rhythm.     Heart sounds: Murmur heard.  Pulmonary:     Effort: Pulmonary effort is normal. No respiratory distress.     Breath sounds: Normal breath sounds.  Abdominal:     Palpations: Abdomen is soft.     Tenderness: There is no abdominal tenderness. There is no guarding or rebound.  Musculoskeletal:        General: No tenderness.     Comments: Fistula in LUE with palpable thrill. There is a dressing in place that is C/D/I  Skin:    General: Skin is warm and dry.  Neurological:     Mental Status: She is alert and oriented to person, place, and time.  Psychiatric:        Behavior: Behavior normal.     ED Results / Procedures / Treatments   Labs (all labs ordered are listed, but only abnormal results are displayed) Labs Reviewed  BASIC METABOLIC PANEL - Abnormal;  Notable for the following components:      Result Value   Chloride 97 (*)    Glucose, Bld 184 (*)    BUN 36 (*)    Creatinine, Ser 5.98 (*)    GFR, Estimated 8 (*)    All other components within normal limits  CBC WITH DIFFERENTIAL/PLATELET - Abnormal; Notable for the following components:   RBC 2.87 (*)    Hemoglobin 9.1 (*)    HCT 27.5 (*)    All other components within normal limits    EKG None  Radiology No results found.  Procedures Procedures    Medications Ordered in ED Medications -  No data to display  ED Course/ Medical Decision Making/ A&P                                 Medical Decision Making Amount and/or Complexity of Data Reviewed Labs: ordered.   Patient with history of ESRD on hemodialysis, recent admission for NSTEMI with stent placement here for evaluation of bleeding after removing her dialysis bandage.  The bleeding is controlled at time of ED arrival and has been throughout her ED stay.  CBC with stable anemia.  BMP with stable values.  Feel patient is stable for discharge home.  Discussed bleeding precautions in setting of using Brilinta.  She does need to continue this medication.  Discussed keeping her bandage on after completing dialysis and bleeding control at home.  Also discussed return precautions if she has recurrent bleeding or new concerning symptoms.        Final Clinical Impression(s) / ED Diagnoses Final diagnoses:  Bleeding from dialysis shunt, initial encounter [facility])    Rx / DC Orders ED Discharge Orders     None         Tilden Fossa, MD 02/05/24 [phone]

## 2024-02-05 NOTE — Discharge Instructions (Addendum)
Continue to take your medications as prescribed.

## 2024-02-05 NOTE — ED Notes (Signed)
..  The patient is A&OX4, wheeled out of ED via wheelchair, NAD. Pt verbalized understanding of d/c instructions and follow up care.

## 2024-02-10 ENCOUNTER — Telehealth (HOSPITAL_COMMUNITY): Payer: Self-pay

## 2024-02-10 NOTE — Telephone Encounter (Signed)
 Called and spoke with pt in regards to CR, pt stated she is not interested at this time due to having a lot of other appts.   Closed referral

## 2024-02-11 NOTE — Telephone Encounter (Signed)
 Has and has appointment with PCP and CVD Cardiology doctors.

## 2024-02-12 NOTE — Progress Notes (Unsigned)
 Cardiology Office Note    Patient Name: Karen [name] Date of Encounter: 02/13/2024  Primary Care Provider:  Loura Pardon, PA Primary Cardiologist:  None Primary Electrophysiologist: None   Past Medical History    Past Medical History:  Diagnosis Date   ADHD (attention deficit hyperactivity disorder)    Anemia    Arthritis    Asthma    mild = rarely uses inhaler   Blindness of left eye    retinopathy   Diabetes mellitus without complication (HCC)    type 2   Dyspnea    with exertion   Fibromyalgia    GERD (gastroesophageal reflux disease)    no meds   Hypertension    Lung nodule    1.0 cm LLL lung nodule 07/16/22 CT (evaluation by [facility] Dr. Rosebud Poles)   Osteomyelitis [facility])    RIGHT FOOT FIFTH TOE   Ovarian mass, left 06/26/2021   Renal disorder    ESRD   Sleep apnea    does not use cpap    History of Present Illness  Karen [name] is a 48 y.o. female with a PMH of CAD s/p NSTEMI with heavily calcified 99% stenosis in the mid LAD treated with orbital arthrectomy and DES x 1, and severe stenosis in distal circumflex too small for PCI, HLD, ESRD (M, W, FR), remote history of endocarditis, VATS s/p lobectomy 2021, DM type II, HTN, fibromyalgia, GERD, OSA (not on CPAP), asthma who presents today for posthospital follow-up.  Karen [name] on 01/26/2024 with complaint of back pain with sore throat chest discomfort.  She also endorsed intermittent shortness of breath.  She completed a stress test 1 year prior that showed a small reversible defect the apex.  She completed high-sensitivity troponin 372>> 612, WBC 8.7, hemoglobin 10.4, D-dimer 0.72 and chest x-ray with mild cardiomegaly and atelectasis. Respiratory panel was negative.  CT of the chest was completed showing three-vessel calcifications with small pericardial effusion and dilated pulmonary arteries.  2D echo was completed showing EF of 40-45% with mild LVH and grade 1 DD with mild MR and  inferior septal, apical septal and apical inferior akinesis.  She underwent an LHC that showed 20% proximal to mid RCA 20% ostial left circumflex to proximal left circumflex 9% distal left circumflex and 99% mid LAD treated with DES to distal and distal circumflex too small for PCI.  She was started on Brilinta and ASA postprocedure.  She was discharged on 01/28/2024 and presented back to the ED on 02/05/2024 with bleeding from her vascular access.  She had a CBC and BMP completed that showed stable values and she was advised to continue Brilinta and that time.  Karen [name] presents today for post PCI follow-up.   She had a stent placed in the mid left anterior descending artery due to a 99% blockage. The patient reports experiencing low blood pressure and increased fatigue since the hospitalization. The patient also had a bleeding episode from the dialysis site, which was managed with a liquid bandage and has since resolved. The patient has not had any further bleeding episodes. The patient's medications include metoprolol and Lipitor. The patient has been adhering to a healthier diet since the heart attack but inquires about the possibility of occasionally indulging in less healthy food options. The patient also expresses concern about the feasibility of attending cardiac rehab due to the frequency of dialysis sessions and the associated costs. Patient denies chest pain, palpitations, dyspnea, PND, orthopnea, nausea, vomiting, dizziness, syncope, edema, weight  gain, or early satiety.  Review of Systems  Please see the history of present illness.    All other systems reviewed and are otherwise negative except as noted above.  Physical Exam    Wt Readings from Last 3 Encounters:  01/28/24 195 lb 8.8 oz (88.7 kg)  09/30/22 190 lb 11.2 oz (86.5 kg)  08/06/22 190 lb 11.2 oz (86.5 kg)   VS: Vitals:   02/13/24 0832  BP: (!) 80/62  Pulse: 74  SpO2: 94%  ,Body mass index is 32.54 kg/m. GEN: Well  nourished, well developed in no acute distress Neck: No JVD; No carotid bruits Pulmonary: Clear to auscultation without rales, wheezing or rhonchi  Cardiovascular: Normal rate. Regular right femoral artery site clean dry and intact with no ecchymosis or hematoma present rhythm. Normal S1. Normal S2.   Murmurs: There is no murmur.  ABDOMEN: Soft, non-tender, non-distended EXTREMITIES:  No edema; No deformity left upper arm fistula  EKG/LABS/ Recent Cardiac Studies   ECG personally reviewed by me today -none completed today  Risk Assessment/Calculations:          Lab Results  Component Value Date   WBC 9.4 02/05/2024   HGB 9.1 (L) 02/05/2024   HCT 27.5 (L) 02/05/2024   MCV 95.8 02/05/2024   PLT 283 02/05/2024   Lab Results  Component Value Date   CREATININE 5.98 (H) 02/05/2024   BUN 36 (H) 02/05/2024   NA 137 02/05/2024   K 4.1 02/05/2024   CL 97 (L) 02/05/2024   CO2 26 02/05/2024   No results found for: "CHOL", "HDL", "LDLCALC", "LDLDIRECT", "TRIG", "CHOLHDL"  Lab Results  Component Value Date   HGBA1C 7.4 (H) 01/27/2024   Assessment & Plan    1.  CAD: -s/p LHC due to NSTEMI with heavily calcified 99% stenosis in the mid LAD treated with orbital arthrectomy and DES x 1, and severe stenosis in distal circumflex too small for PCI -Today patient reports no chest pain or angina since PCI. -She reports increased fatigue and low blood pressure since her PCI. -Right groin site clean dry and intact with no evidence of hematoma, patient advised to progress activity slowly -We will discontinue Toprol-XL 25 mg and start metoprolol to tartrate 12.5 mg twice daily with 12.5 on dialysis days of Monday, Wednesday, and Friday -Continue current GDMT with ASA 81 mg, Lipitor 40 mg -Patient is interested in cardiac rehab and referral will be sent back to discuss further.  2.  Essential HTN: -Patient's blood pressure today was initially 80/62 and was 138/78 on recheck -We will change  Toprol-XL to metoprolol tartrate 12.5 mg twice daily -Patient will monitor BP over the next 2 weeks and contact office with readings.  3.  Hyperlipidemia: -Patient's last LDL cholesterol was 120 -Continue atorvastatin 40 mg daily and we will recheck LFT and lipids in 8 weeks  4.  History of ESRD: -Dialyzed Monday, Wednesday, Friday -continue current treatment plan per nephrology  5.  DM type II: -Patient is currently on insulin pump -Continue treatment plan per PCP  6.HFmrEF: -Patient's EF was 40-45% RWMA mild concentric LVH with grade 1 DD -Current volume status managed by dialysis. -Toprol-XL changed to metoprolol to tartrate due to fatigue and hypotension. -Titration of GDMT hindered by ESRD -We will recheck 2D echo in 3 months  Disposition: Follow-up with None or APP in 3 months    Signed, Napoleon Form, Leodis Rains, NP 02/13/2024, 9:17 AM [facility]

## 2024-02-13 ENCOUNTER — Encounter: Payer: Self-pay | Admitting: Nurse Practitioner

## 2024-02-13 ENCOUNTER — Ambulatory Visit: Payer: Medicare HMO | Attending: Cardiology | Admitting: Nurse Practitioner

## 2024-02-13 ENCOUNTER — Other Ambulatory Visit: Payer: Self-pay

## 2024-02-13 VITALS — BP 80/62 | HR 74 | Ht 65.0 in

## 2024-02-13 DIAGNOSIS — E785 Hyperlipidemia, unspecified: Secondary | ICD-10-CM | POA: Diagnosis not present

## 2024-02-13 DIAGNOSIS — I5040 Unspecified combined systolic (congestive) and diastolic (congestive) heart failure: Secondary | ICD-10-CM

## 2024-02-13 DIAGNOSIS — N186 End stage renal disease: Secondary | ICD-10-CM

## 2024-02-13 DIAGNOSIS — I2510 Atherosclerotic heart disease of native coronary artery without angina pectoris: Secondary | ICD-10-CM | POA: Diagnosis not present

## 2024-02-13 DIAGNOSIS — E11319 Type 2 diabetes mellitus with unspecified diabetic retinopathy without macular edema: Secondary | ICD-10-CM

## 2024-02-13 DIAGNOSIS — I10 Essential (primary) hypertension: Secondary | ICD-10-CM

## 2024-02-13 DIAGNOSIS — Z794 Long term (current) use of insulin: Secondary | ICD-10-CM

## 2024-02-13 DIAGNOSIS — Z992 Dependence on renal dialysis: Secondary | ICD-10-CM

## 2024-02-13 MED ORDER — METOPROLOL TARTRATE 25 MG PO TABS: ORAL_TABLET | ORAL | 1 refills | Status: DC

## 2024-02-13 NOTE — Addendum Note (Signed)
 Addended by: Alveta Heimlich on: 02/13/2024 09:42 AM   Modules accepted: Orders

## 2024-02-13 NOTE — Patient Instructions (Addendum)
 Medication Instructions:  STOP Toprol XL  START Metoprolol 12.5mg  Take 1 tablet twice a day on Tues/Thurs/ Saturday and one tablet once a day on dialysis days  *If you need a refill on your cardiac medications before your next appointment, please call your pharmacy*   Lab Work: 8 WEEKS FASTING LIPIDs & LFTs  If you have labs (blood work) drawn today and your tests are completely normal, you will receive your results only by: MyChart Message (if you have MyChart) OR A paper copy in the mail If you have any lab test that is abnormal or we need to change your treatment, we will call you to review the results.   Testing/Procedures: Your physician has requested that you have an echocardiogram. Echocardiography is a painless test that uses sound waves to create images of your heart. It provides your doctor with information about the size and shape of your heart and how well your heart's chambers and valves are working. This procedure takes approximately one hour. There are no restrictions for this procedure. Please do NOT wear cologne, perfume, aftershave, or lotions (deodorant is allowed). Please arrive 15 minutes prior to your appointment time.  Please note: We ask at that you not bring children with you during ultrasound (echo/ vascular) testing. Due to room size and safety concerns, children are not allowed in the ultrasound rooms during exams. Our front office staff cannot provide observation of children in our lobby area while testing is being conducted. An adult accompanying a patient to their appointment will only be allowed in the ultrasound room at the discretion of the ultrasound technician under special circumstances. We apologize for any inconvenience. SCHEDULE IN 3 MONTHS    Follow-Up: At [facility], you and your health needs are our priority.  As part of our continuing mission to provide you with exceptional heart care, we have created designated Provider Care Teams.   These Care Teams include your primary Cardiologist (physician) and Advanced Practice Providers (APPs -  Physician Assistants and Nurse Practitioners) who all work together to provide you with the care you need, when you need it.  We recommend signing up for the patient portal called "MyChart".  Sign up information is provided on this After Visit Summary.  MyChart is used to connect with patients for Virtual Visits (Telemedicine).  Patients are able to view lab/test results, encounter notes, upcoming appointments, etc.  Non-urgent messages can be sent to your provider as well.   To learn more about what you can do with MyChart, go to [email or web address].    Your next appointment:   3 month(s)  Provider:   Robin Searing, NP  Other Instructions Monitor your blood pressure for the next two weeks with medication change We will contact cardiac rehab and have them contact you about an appt

## 2024-02-18 ENCOUNTER — Telehealth (HOSPITAL_COMMUNITY): Payer: Self-pay

## 2024-02-18 NOTE — Telephone Encounter (Signed)
 Reopen CR referral per pt request, will pass to RN for review.

## 2024-02-19 ENCOUNTER — Telehealth (HOSPITAL_COMMUNITY): Payer: Self-pay | Admitting: *Deleted

## 2024-02-19 NOTE — Telephone Encounter (Signed)
 Called and left message for pt regarding ability to participate in cardiac rehab with dialysis on the same days and transfer from wheelchair to equipment.  Contact information provided. Alanson Aly, BSN Cardiac and Emergency planning/management officer

## 2024-02-19 NOTE — Telephone Encounter (Signed)
 Herma returned my call.  Talked about options for Cardiac rehab.  Unfortunately her dialysis time is at 6 in the morning. On MWF She feels she would be unable to exercise after dialysis as she is usually"wiped out". Talked about options of other programs such as [facility] or [facility]  Contact information given.  Other options for exercising on her own also provided.  Would benefit from aquatic therapy as she has significant back issues and limited mobility.  Has accessed to RD at the HD clinic.  Will seek assistance with dietary resources with adding heart healthy items in with her renal diet.  Thanked me for the assistance. Alanson Aly, BSN Cardiac and Emergency planning/management officer

## 2024-02-26 ENCOUNTER — Other Ambulatory Visit: Payer: Self-pay | Admitting: Nurse Practitioner

## 2024-02-26 ENCOUNTER — Telehealth: Payer: Self-pay | Admitting: Nurse Practitioner

## 2024-02-26 MED ORDER — ASPIRIN 81 MG PO TBEC: 81.0000 mg | DELAYED_RELEASE_TABLET | Freq: Every day | ORAL | 3 refills | Status: AC

## 2024-02-26 MED ORDER — ATORVASTATIN CALCIUM 40 MG PO TABS: 40.0000 mg | ORAL_TABLET | Freq: Every day | ORAL | 3 refills | Status: DC

## 2024-02-26 MED ORDER — TICAGRELOR 90 MG PO TABS: 90.0000 mg | ORAL_TABLET | Freq: Two times a day (BID) | ORAL | 3 refills | Status: DC

## 2024-02-26 NOTE — Telephone Encounter (Signed)
*  STAT* If patient is at the pharmacy, call can be transferred to refill team.   1. Which medications need to be refilled? (please list name of each medication and dose if known)   aspirin EC 81 MG tablet atorvastatin (LIPITOR) 40 MG tablet  ticagrelor (BRILINTA) 90 MG TABS tablet   2. Would you like to learn more about the convenience, safety, & potential cost savings by using the [facility]?   3. Are you open to using the [facility] (Type [facility]. ).  4. Which pharmacy/location (including street and city if local pharmacy) is medication to be sent to?  [id number] - [place], [place] - [address]   5. Do they need a 30 day or 90 day supply?   90 day  Patient stated she only has a few days left of these medications.

## 2024-02-26 NOTE — Telephone Encounter (Signed)
 Pt's medications were sent to pt's pharmacy as requested. Confirmation received.

## 2024-02-27 ENCOUNTER — Telehealth: Payer: Self-pay | Admitting: Nurse Practitioner

## 2024-02-27 ENCOUNTER — Telehealth: Payer: Self-pay | Admitting: Cardiology

## 2024-02-27 NOTE — Telephone Encounter (Signed)
 Pt returning call to a nurse

## 2024-02-27 NOTE — Telephone Encounter (Signed)
 Left message to call back

## 2024-02-27 NOTE — Telephone Encounter (Signed)
 Received a page at [phone] from [facility] (closing in 17 min from time of page). Called back to discuss patient's care. Pharmacy had requested earlier today a PA for ticagrelor. Per messages, it appears staff was waiting on patient's primary cardiologist to decide if she should switch to clopidogrel.   Unfortunately, patient took her last dose of ticagrelor this AM. She is also unable to afford ticagrelor out of pocket, even for a short 4-5 day course ($70). Given this, decided to switch her to clopidogrel with 600mg  load tonight, then 75mg  daily. Given 30day course. Will route message to patient's primary cardiology team for further management.  Karen [name]  02/27/24 9:03 PM

## 2024-02-27 NOTE — Telephone Encounter (Signed)
 Pt c/o medication issue:  1. Name of Medication: ticagrelor (BRILINTA) 90 MG TABS tablet   2. How are you currently taking this medication (dosage and times per day)? 90 mg, 2 times daily   3. Are you having a reaction (difficulty breathing--STAT)? No  4. What is your medication issue? Pt states that pharmacy was not able to fill due to issue unknown to pt, requesting cb and call to pharmacy

## 2024-02-27 NOTE — Telephone Encounter (Signed)
Left second message for patient to call back.

## 2024-02-28 ENCOUNTER — Other Ambulatory Visit: Payer: Self-pay | Admitting: Nurse Practitioner

## 2024-03-01 MED ORDER — CLOPIDOGREL BISULFATE 75 MG PO TABS: 75.0000 mg | ORAL_TABLET | Freq: Every day | ORAL | 3 refills | Status: DC

## 2024-03-01 NOTE — Telephone Encounter (Signed)
 Yes, she was switched already to plavix. It does look like she is dual medicare/medicaid so if PA was approved, she should be able to afford. Would you like to just leave her on clopidogrel, or should we try to switch her back by doing PA?

## 2024-03-01 NOTE — Telephone Encounter (Signed)
 I did speak with patient and confirmed she did take th 600mg  load and is now taking 1 tablet daily. She said she was charged by the pharmacy which she shouldn't have been since she is dual medicare/medicaid. Advised she would need to discuss this with pharmacy. Rx for clopidogrel 40m daily for 90 ds sent

## 2024-03-04 ENCOUNTER — Telehealth: Payer: Self-pay | Admitting: Nurse Practitioner

## 2024-03-04 NOTE — Telephone Encounter (Signed)
 I spoke with patient and she would like to know what medications would be safe for her to take with her cardiac medications.

## 2024-03-04 NOTE — Telephone Encounter (Signed)
 Gaston Islam., NP  Alveta Heimlich, CMA Caller: Unspecified (Today, 11:41 AM) Patient know that loratadine (Claritin) is safe for ESRD and cardiac patients.  Please advise patient to avoid antihistamines as well as medications with decongestants because they can aggravate blood pressure.  I would also advise patient to discuss further with her nephrologist as well as her local pharmacist.  Thanks, Robin Searing, NP

## 2024-03-04 NOTE — Telephone Encounter (Signed)
 Pt c/o medication issue:  1. Name of Medication:   Benadryl  2. How are you currently taking this medication (dosage and times per day)?   3. Are you having a reaction (difficulty breathing--STAT)?   4. What is your medication issue?   Patient stated she took 1 four-hour dose of Benadryl yesterday and her sister told her she should not take this medication as a heart patient.  Patient noted she is also on dialysis and wants to know what she can take for her allergies.

## 2024-03-04 NOTE — Telephone Encounter (Signed)
 Patient states dialysis states she can not take claritin. She states she was taking a Astelin nasal spray but she wants to know what else she can take.   Spoke with Alden Server who recommends the patient contact her kidney doctor. Patient notified and voiced understanding.

## 2024-03-11 ENCOUNTER — Ambulatory Visit: Payer: Medicare HMO | Admitting: Podiatry

## 2024-03-12 ENCOUNTER — Ambulatory Visit: Admitting: Podiatry

## 2024-03-12 ENCOUNTER — Encounter: Payer: Self-pay | Admitting: Podiatry

## 2024-03-12 DIAGNOSIS — E1169 Type 2 diabetes mellitus with other specified complication: Secondary | ICD-10-CM | POA: Diagnosis not present

## 2024-03-12 DIAGNOSIS — B351 Tinea unguium: Secondary | ICD-10-CM | POA: Diagnosis not present

## 2024-03-12 DIAGNOSIS — L84 Corns and callosities: Secondary | ICD-10-CM

## 2024-03-12 DIAGNOSIS — E1142 Type 2 diabetes mellitus with diabetic polyneuropathy: Secondary | ICD-10-CM

## 2024-03-12 NOTE — Progress Notes (Signed)
  Subjective:  Patient ID: Karen [name], female    DOB: 1976-06-17,   MRN: [id number]  No chief complaint on file.   48 y.o. female presents for concern of thickened elongated and painful nails that are difficult to trim. Requesting to have them trimmed today. Relates she has been doing well and no concern for ulceration today.   Relates burning and tingling in their feet. Patient is diabetic and last A1c was 7.0 on 04/15/23  PCP:  Loura Pardon, PA     Past Medical History:  Diagnosis Date   ADHD (attention deficit hyperactivity disorder)    Anemia    Arthritis    Asthma    mild = rarely uses inhaler   Blindness of left eye    retinopathy   Diabetes mellitus without complication (HCC)    type 2   Dyspnea    with exertion   Fibromyalgia    GERD (gastroesophageal reflux disease)    no meds   Hypertension    Lung nodule    1.0 cm LLL lung nodule 07/16/22 CT (eval[facility] Dr. Rosebud Poles)   Osteomyelitis [facility])    RIGHT FOOT FIFTH TOE   Ovarian mass, left 06/26/2021   Renal disorder    ESRD   Sleep apnea    does not use cpap    Objective:  Physical Exam: Vascular: DP/PT pulses 2/4 bilateral. CFT <3 seconds. Absent hair growth on digits. Edema noted to bilateral lower extremities. Xerosis noted bilaterally.  Skin. No lacerations or abrasions bilateral feet. Nails 1-5 on left and 1-3 on right  are thickened discolored and elongated with subungual debris.  Hyperkeratosis noted to latearl foot on right Musculoskeletal: MMT 5/5 bilateral lower extremities in DF, PF, Inversion and Eversion. Deceased ROM in DF of ankle joint. 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  Neurological: Sensation intact to light touch. Protective sensation diminished bilateral.    Assessment:   1. 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 Pre-ulcerative calluses   3. Type 2 diabetes mellitus with diabetic polyneuropathy,  unspecified whether long term insulin use (HCC)        Plan:  Patient was evaluated and treated and all questions answered. -Discussed and educated patient on diabetic foot care, especially with  regards to the vascular, neurological and musculoskeletal systems.  -Stressed the importance of good glycemic control and the detriment of not  controlling glucose levels in relation to the foot. -Discussed supportive shoes at all times and checking feet regularly.  -Mechanically debrided all nails 1-5 bilateral using sterile nail nipper and filed with dremel without incident -Hyperkeratotic tissue debrided to right lateral foot without incident with chisel.   -Answered all patient questions -Patient to return  in 3 months for at risk foot care -Patient advised to call the office if any problems or questions arise in the meantime.   Louann Sjogren, DPM

## 2024-05-04 DIAGNOSIS — Z902 Acquired absence of lung [part of]: Secondary | ICD-10-CM | POA: Insufficient documentation

## 2024-05-04 DIAGNOSIS — Z89421 Acquired absence of other right toe(s): Secondary | ICD-10-CM | POA: Insufficient documentation

## 2024-05-25 ENCOUNTER — Other Ambulatory Visit (HOSPITAL_COMMUNITY)

## 2024-05-27 ENCOUNTER — Ambulatory Visit: Admitting: Nurse Practitioner

## 2024-06-15 ENCOUNTER — Other Ambulatory Visit: Payer: Self-pay | Admitting: *Deleted

## 2024-06-15 ENCOUNTER — Telehealth: Payer: Self-pay | Admitting: *Deleted

## 2024-06-15 DIAGNOSIS — E785 Hyperlipidemia, unspecified: Secondary | ICD-10-CM

## 2024-06-15 DIAGNOSIS — Z79899 Other long term (current) drug therapy: Secondary | ICD-10-CM

## 2024-06-15 NOTE — Telephone Encounter (Signed)
 Lab requesting order for lipid and liver as previously ordered by [facility].

## 2024-06-16 ENCOUNTER — Ambulatory Visit: Payer: Self-pay | Admitting: Nurse Practitioner

## 2024-06-16 LAB — LIPID PANEL
Chol/HDL Ratio: 3.2 ratio (ref 0.0–4.4)
Cholesterol, Total: 135 mg/dL (ref 100–199)
HDL: 42 mg/dL (ref >39)
LDL Chol Calc (NIH): 72 mg/dL (ref 0–99)
Triglycerides: 114 mg/dL (ref 0–149)
VLDL Cholesterol Cal: 21 mg/dL (ref 5–40)

## 2024-06-16 LAB — HEPATIC FUNCTION PANEL
ALT: 46 IU/L — ABNORMAL HIGH (ref 0–32)
AST: 38 IU/L (ref 0–40)
Albumin: 4.1 g/dL (ref 3.9–4.9)
Alkaline Phosphatase: 461 IU/L — ABNORMAL HIGH (ref 44–121)
Bilirubin Total: 0.5 mg/dL (ref 0.0–1.2)
Bilirubin, Direct: 0.22 mg/dL (ref 0.00–0.40)
Total Protein: 6.9 g/dL (ref 6.0–8.5)

## 2024-06-17 ENCOUNTER — Encounter: Payer: Self-pay | Admitting: Podiatry

## 2024-06-17 ENCOUNTER — Ambulatory Visit (INDEPENDENT_AMBULATORY_CARE_PROVIDER_SITE_OTHER): Admitting: Podiatry

## 2024-06-17 DIAGNOSIS — E1142 Type 2 diabetes mellitus with diabetic polyneuropathy: Secondary | ICD-10-CM | POA: Diagnosis not present

## 2024-06-17 DIAGNOSIS — B351 Tinea unguium: Secondary | ICD-10-CM | POA: Diagnosis not present

## 2024-06-17 DIAGNOSIS — L84 Corns and callosities: Secondary | ICD-10-CM | POA: Diagnosis not present

## 2024-06-17 NOTE — Progress Notes (Signed)
  Subjective:  Patient ID: Karen [name], female    DOB: 1976-06-28,   MRN: [id number]  Chief Complaint  Patient presents with   Diabetes    Regular routine Diabetic check up.    48 y.o. female presents for concern of thickened elongated and painful nails that are difficult to trim. Requesting to have them trimmed today. Relates she has been doing well and no concern for ulceration today.   Relates burning and tingling in their feet. Patient is diabetic and last A1c was 7.0 on 04/15/23  PCP:  Lesa Jon HERO, PA     Past Medical History:  Diagnosis Date   ADHD (attention deficit hyperactivity disorder)    Anemia    Arthritis    Asthma    mild = rarely uses inhaler   Blindness of left eye    retinopathy   Diabetes mellitus without complication (HCC)    type 2   Dyspnea    with exertion   Fibromyalgia    GERD (gastroesophageal reflux disease)    no meds   Hypertension    Lung nodule    1.0 cm LLL lung nodule 07/16/22 CT (evaluation by [facility] Dr. Allyne Render)   Osteomyelitis [facility])    RIGHT FOOT FIFTH TOE   Ovarian mass, left 06/26/2021   Renal disorder    ESRD   Sleep apnea    does not use cpap    Objective:  Physical Exam: Vascular: DP/PT pulses 2/4 bilateral. CFT <3 seconds. Absent hair growth on digits. Edema noted to bilateral lower extremities. Xerosis noted bilaterally.  Skin. No lacerations or abrasions bilateral feet. Nails 1-5 on left and 1-3 on right  are thickened discolored and elongated with subungual debris.  Hyperkeratosis noted to latearl foot on right Musculoskeletal: MMT 5/5 bilateral lower extremities in DF, PF, Inversion and Eversion. Deceased ROM in DF of ankle joint. 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  Neurological: Sensation intact to light touch. Protective sensation diminished bilateral.    Assessment:   1. 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 -Discussed and educated patient on diabetic foot care, especially with  regards to the vascular, neurological and musculoskeletal systems.  -Stressed the importance of good glycemic control and the detriment of not  controlling glucose levels in relation to the foot. -Discussed supportive shoes at all times and checking feet regularly.  -Mechanically debrided all nails 1-5 bilateral using sterile nail nipper and filed with dremel without incident -Hyperkeratotic tissue debrided to right lateral foot without incident with chisel.   -Answered all patient questions -Patient to return  in 3 months for at risk foot care -Patient advised to call the office if any problems or questions arise in the meantime.   Asberry Failing, DPM

## 2024-06-21 ENCOUNTER — Emergency Department (HOSPITAL_COMMUNITY)

## 2024-06-21 ENCOUNTER — Encounter (HOSPITAL_COMMUNITY): Payer: Self-pay | Admitting: *Deleted

## 2024-06-21 ENCOUNTER — Other Ambulatory Visit: Payer: Self-pay

## 2024-06-21 ENCOUNTER — Emergency Department (HOSPITAL_COMMUNITY)
Admission: EM | Admit: 2024-06-21 | Discharge: 2024-06-21 | Disposition: A | Discharge status: Home / Self Care | Attending: Emergency Medicine | Admitting: Emergency Medicine

## 2024-06-21 DIAGNOSIS — I2510 Atherosclerotic heart disease of native coronary artery without angina pectoris: Secondary | ICD-10-CM | POA: Insufficient documentation

## 2024-06-21 DIAGNOSIS — M546 Pain in thoracic spine: Secondary | ICD-10-CM | POA: Diagnosis present

## 2024-06-21 DIAGNOSIS — E875 Hyperkalemia: Secondary | ICD-10-CM | POA: Insufficient documentation

## 2024-06-21 DIAGNOSIS — I1311 Hypertensive heart and chronic kidney disease without heart failure, with stage 5 chronic kidney disease, or end stage renal disease: Secondary | ICD-10-CM | POA: Insufficient documentation

## 2024-06-21 DIAGNOSIS — N186 End stage renal disease: Secondary | ICD-10-CM | POA: Diagnosis not present

## 2024-06-21 DIAGNOSIS — Z79899 Other long term (current) drug therapy: Secondary | ICD-10-CM | POA: Diagnosis not present

## 2024-06-21 DIAGNOSIS — D649 Anemia, unspecified: Secondary | ICD-10-CM | POA: Diagnosis not present

## 2024-06-21 DIAGNOSIS — R0989 Other specified symptoms and signs involving the circulatory and respiratory systems: Secondary | ICD-10-CM | POA: Diagnosis not present

## 2024-06-21 DIAGNOSIS — J45909 Unspecified asthma, uncomplicated: Secondary | ICD-10-CM | POA: Insufficient documentation

## 2024-06-21 DIAGNOSIS — R5383 Other fatigue: Secondary | ICD-10-CM | POA: Insufficient documentation

## 2024-06-21 DIAGNOSIS — Z7982 Long term (current) use of aspirin: Secondary | ICD-10-CM | POA: Diagnosis not present

## 2024-06-21 DIAGNOSIS — Z992 Dependence on renal dialysis: Secondary | ICD-10-CM | POA: Diagnosis not present

## 2024-06-21 DIAGNOSIS — Z7901 Long term (current) use of anticoagulants: Secondary | ICD-10-CM | POA: Diagnosis not present

## 2024-06-21 DIAGNOSIS — R400 Somnolence: Secondary | ICD-10-CM

## 2024-06-21 DIAGNOSIS — R7989 Other specified abnormal findings of blood chemistry: Secondary | ICD-10-CM

## 2024-06-21 DIAGNOSIS — Z7951 Long term (current) use of inhaled steroids: Secondary | ICD-10-CM | POA: Diagnosis not present

## 2024-06-21 DIAGNOSIS — J9811 Atelectasis: Secondary | ICD-10-CM | POA: Diagnosis not present

## 2024-06-21 DIAGNOSIS — Z794 Long term (current) use of insulin: Secondary | ICD-10-CM | POA: Diagnosis not present

## 2024-06-21 DIAGNOSIS — E1122 Type 2 diabetes mellitus with diabetic chronic kidney disease: Secondary | ICD-10-CM | POA: Insufficient documentation

## 2024-06-21 LAB — BASIC METABOLIC PANEL WITH GFR
Anion gap: 15 (ref 5–15)
BUN: 93 mg/dL — ABNORMAL HIGH (ref 6–20)
CO2: 24 mmol/L (ref 22–32)
Calcium: 8.6 mg/dL — ABNORMAL LOW (ref 8.9–10.3)
Chloride: 100 mmol/L (ref 98–111)
Creatinine, Ser: 9.92 mg/dL — ABNORMAL HIGH (ref 0.44–1.00)
GFR, Estimated: 4 mL/min — ABNORMAL LOW (ref >60)
Glucose, Bld: 112 mg/dL — ABNORMAL HIGH (ref 70–99)
Potassium: 5.3 mmol/L — ABNORMAL HIGH (ref 3.5–5.1)
Sodium: 139 mmol/L (ref 135–145)

## 2024-06-21 LAB — TROPONIN I (HIGH SENSITIVITY)
Troponin I (High Sensitivity): 25 ng/L — ABNORMAL HIGH (ref <18)
Troponin I (High Sensitivity): 24 ng/L — ABNORMAL HIGH (ref <18)

## 2024-06-21 LAB — CBC
HCT: 32.2 % — ABNORMAL LOW (ref 36.0–46.0)
Hemoglobin: 10.4 g/dL — ABNORMAL LOW (ref 12.0–15.0)
MCH: 30.8 pg (ref 26.0–34.0)
MCHC: 32.3 g/dL (ref 30.0–36.0)
MCV: 95.3 fL (ref 80.0–100.0)
Platelets: 248 K/uL (ref 150–400)
RBC: 3.38 MIL/uL — ABNORMAL LOW (ref 3.87–5.11)
RDW: 13.3 % (ref 11.5–15.5)
WBC: 9.3 K/uL (ref 4.0–10.5)
nRBC: 0 % (ref 0.0–0.2)

## 2024-06-21 LAB — HEPATITIS B SURFACE ANTIGEN: Hepatitis B Surface Ag: NONREACTIVE

## 2024-06-21 MED ORDER — PROCHLORPERAZINE EDISYLATE 10 MG/2ML IJ SOLN
10.0000 mg | Freq: Once | INTRAMUSCULAR | Status: AC
  Administered 2024-06-21: 10 mg via INTRAVENOUS
  Filled 2024-06-21: qty 2

## 2024-06-21 MED ORDER — DOXYCYCLINE HYCLATE 100 MG PO CAPS: 100.0000 mg | ORAL_CAPSULE | Freq: Two times a day (BID) | ORAL | 0 refills | Status: AC

## 2024-06-21 MED ORDER — HYDRALAZINE HCL 25 MG PO TABS
50.0000 mg | ORAL_TABLET | Freq: Once | ORAL | Status: AC
  Administered 2024-06-21: 50 mg via ORAL
  Filled 2024-06-21: qty 2

## 2024-06-21 MED ORDER — SODIUM ZIRCONIUM CYCLOSILICATE 10 G PO PACK
10.0000 g | PACK | Freq: Once | ORAL | Status: AC
  Administered 2024-06-21: 10 g via ORAL
  Filled 2024-06-21: qty 1

## 2024-06-21 MED ORDER — DOXYCYCLINE HYCLATE 100 MG PO TABS
100.0000 mg | ORAL_TABLET | Freq: Once | ORAL | Status: AC
  Administered 2024-06-21: 100 mg via ORAL
  Filled 2024-06-21: qty 1

## 2024-06-21 MED ORDER — CHLORHEXIDINE GLUCONATE CLOTH 2 % EX PADS: 6.0000 | MEDICATED_PAD | Freq: Every day | CUTANEOUS | Status: DC

## 2024-06-21 NOTE — ED Provider Notes (Signed)
 Patient has returned from dialysis.  Feels much better.  Completed nearly 100% of the dialysis session.  Stable for discharge at this time with plan for outpatient PCP and dialysis as scheduled.She will be discharged with prescription for doxycycline  for treatment of potential right-sided pneumonia as written by previous provider.  Return precautions were discussed in detail.   Pamella Ozell LABOR, DO 06/21/24 1557

## 2024-06-21 NOTE — ED Notes (Signed)
 Assisted patient to restroom.

## 2024-06-21 NOTE — Procedures (Signed)
 I was present at the procedure, reviewed the HD regimen and made appropriate changes.   Myer Fret MD  [facility] 06/21/2024, 11:22 AM

## 2024-06-21 NOTE — ED Notes (Signed)
 Patient transported to CT

## 2024-06-21 NOTE — ED Provider Notes (Signed)
 [place] EMERGENCY DEPARTMENT AT [facility] Provider Note   CSN: [id number] Arrival date & time: 06/21/24  0028     Patient presents with: Chest Pain   Karen [name] is a 48 y.o. female.    Chest Pain Associated symptoms: back pain and fatigue   Patient presents for multiple complaints.  Medical history includes DM, HTN, chronic pain, anemia, ESRD, OSA, CAD, GERD, fibromyalgia, asthma.  She undergoes HD on M, W, F.  Last session was Friday.  Since yesterday, she has been having right upper back pain, generalized weakness, and increased somnolence.  Her back pain is not worsened with movements or deep inspiration.  She was sleeping throughout most of the day today which is not normal for her.     Prior to Admission medications   Medication Sig Start Date End Date Taking? Authorizing Provider  doxycycline  (VIBRAMYCIN ) 100 MG capsule Take 1 capsule (100 mg total) by mouth 2 (two) times daily for 7 days. 06/21/24 06/28/24 Yes Melvenia Motto, MD  albuterol  (VENTOLIN  HFA) 108 (90 Base) MCG/ACT inhaler Inhale 1-2 puffs into the lungs every 6 (six) hours as needed for wheezing or shortness of breath.    [provider]  amLODipine  (NORVASC ) 10 MG tablet Take 1 tablet (10 mg total) by mouth daily. 06/12/18   Krishnan, Gokul, MD  ammonium lactate  (AMLACTIN) 12 % cream Apply 1 Application topically as needed for dry skin. 12/11/23   Joya Stabs, DPM  aspirin  EC 81 MG tablet Take 1 tablet (81 mg total) by mouth daily. Swallow whole. 02/26/24   Wyn Jackee VEAR Mickey., NP  atorvastatin  (LIPITOR) 40 MG tablet Take 1 tablet (40 mg total) by mouth daily. 02/26/24   Wyn Jackee VEAR Mickey., NP  B Complex-C-Folic Acid  (RENA-VITE PO) Take 1 tablet by mouth in the morning.    [provider]  brimonidine (ALPHAGAN) 0.2 % ophthalmic solution SMARTSIG:1 In Eye(s) Twice Daily 01/29/24   [provider]  cinacalcet  (SENSIPAR ) 30 MG tablet Take 30 mg by mouth every Monday,  Wednesday, and Friday. After hemodialysis    [provider]  clopidogrel  (PLAVIX ) 75 MG tablet Take 1 tablet (75 mg total) by mouth daily. 03/01/24   Shlomo Wilbert SAUNDERS, MD  erythromycin with ethanol (THERAMYCIN) 2 % external solution Apply 1 Application topically in the morning. 04/12/21   [provider]  ethyl chloride spray Apply 1 Application topically as needed (prior to dialysis). 09/29/18   [provider]  FOSRENOL  1000 MG PACK Take 1,000-2,000 mg by mouth See admin instructions. Take 2 packets (2000 mg) by mouth with each meal & take 1 packet (1000 mg) by mouth with each snack 01/12/19   [provider]  gabapentin  (NEURONTIN ) 100 MG capsule Take 100-200 mg by mouth See admin instructions. Take 2 capsules (200 mg) by mouth every morning & take 1 capsule (100 mg) by mouth at bedtime. 03/12/21   [provider]  glucagon 1 MG injection Inject 1 mg into the muscle once as needed (low blood sugar). 06/16/18   [provider]  hydrALAZINE  (APRESOLINE ) 50 MG tablet Take 50 mg by mouth in the morning and at bedtime. Patient not taking: Reported on 06/17/2024 01/08/22   [provider]  insulin  aspart (NOVOLOG ) 100 UNIT/ML injection Inject 0-10 Units into the skin See admin instructions. Via insulin  pump    [provider]  insulin  glargine (LANTUS ) 100 UNIT/ML Solostar Pen Inject 20 Units into the skin at bedtime. Patient not taking:  Reported on 06/17/2024    [provider]  Insulin  Pen Needle (BD PEN NEEDLE NANO U/F) 32G X 4 MM MISC Use with insulin  4x/day Patient not taking: Reported on 06/17/2024 12/09/19   [provider]  ketoconazole (NIZORAL) 2 % shampoo Apply 1 Application topically once a week. 09/03/21   [provider]  lidocaine  (LIDODERM ) 5 % Place 1 patch onto the skin daily. 11/27/23   [provider]  loperamide  (IMODIUM ) 2 MG capsule Take 4 mg by mouth as needed for diarrhea or loose  stools. 10/15/18   [provider]  metoprolol  tartrate (LOPRESSOR ) 25 MG tablet Take (12.5mg ) half tablet twice a day except on dialysis days 02/13/24   Wyn Jackee VEAR Mickey., NP  OZEMPIC, 0.25 OR 0.5 MG/DOSE, 2 MG/3ML SOPN Inject 2 mg into the skin once a week. 01/14/24   [provider]    Allergies: Furosemide, Heparin , and Insulin  aspart (human analog) (yeast)    Review of Systems  Constitutional:  Positive for activity change and fatigue.  Musculoskeletal:  Positive for back pain.  All other systems reviewed and are negative.   Updated Vital Signs BP (!) 146/70   Pulse 70   Temp 98.1 F (36.7 C) (Oral)   Resp 15   Ht 5' 5 (1.651 m)   Wt 88.7 kg   SpO2 99%   BMI 32.54 kg/m   Physical Exam Vitals and nursing note reviewed.  Constitutional:      General: She is not in acute distress.    Appearance: She is well-developed. She is not ill-appearing, toxic-appearing or diaphoretic.  HENT:     Head: Normocephalic and atraumatic.  Eyes:     Conjunctiva/sclera: Conjunctivae normal.  Cardiovascular:     Rate and Rhythm: Normal rate and regular rhythm.  Pulmonary:     Effort: Pulmonary effort is normal. No respiratory distress.  Chest:     Chest wall: No tenderness.  Abdominal:     Palpations: Abdomen is soft.     Tenderness: There is no abdominal tenderness.  Musculoskeletal:        General: No swelling. Normal range of motion.     Cervical back: Normal range of motion and neck supple.  Skin:    General: Skin is warm and dry.     Capillary Refill: Capillary refill takes less than 2 seconds.     Coloration: Skin is not cyanotic or pale.  Neurological:     General: No focal deficit present.     Mental Status: She is alert and oriented to person, place, and time.  Psychiatric:        Mood and Affect: Mood normal.        Behavior: Behavior normal.     (all labs ordered are listed, but only abnormal results are displayed) Labs Reviewed  BASIC METABOLIC  PANEL WITH GFR - Abnormal; Notable for the following components:      Result Value   Potassium 5.3 (*)    Glucose, Bld 112 (*)    BUN 93 (*)    Creatinine, Ser 9.92 (*)    Calcium  8.6 (*)    GFR, Estimated 4 (*)    All other components within normal limits  CBC - Abnormal; Notable for the following components:   RBC 3.38 (*)    Hemoglobin 10.4 (*)    HCT 32.2 (*)    All other components within normal limits  TROPONIN I (HIGH SENSITIVITY) - Abnormal; Notable for the following components:  Troponin I (High Sensitivity) 25 (*)    All other components within normal limits  TROPONIN I (HIGH SENSITIVITY) - Abnormal; Notable for the following components:   Troponin I (High Sensitivity) 24 (*)    All other components within normal limits  HEPATITIS B SURFACE ANTIGEN  HEPATITIS B SURFACE ANTIBODY, QUANTITATIVE    EKG: EKG Interpretation Date/Time:  Monday June 21 2024 01:07:15 EDT Ventricular Rate:  65 PR Interval:  172 QRS Duration:  76 QT Interval:  496 QTC Calculation: 515 R Axis:   88  Text Interpretation: Normal sinus rhythm Nonspecific ST and T wave abnormality Prolonged QT Abnormal ECG Confirmed by Melvenia Motto [phone]) on 06/21/2024 3:23:49 AM  Radiology: CT Chest Wo Contrast Result Date: 06/21/2024 EXAM: CT CHEST WITHOUT CONTRAST 06/21/2024 03:46:00 AM TECHNIQUE: CT of the chest was performed without the administration of intravenous contrast. Multiplanar reformatted images are provided for review. Automated exposure control, iterative reconstruction, and/or weight based adjustment of the mA/kV was utilized to reduce the radiation dose to as low as reasonably achievable. COMPARISON: CTA chest dated 01/26/2024. CLINICAL HISTORY: Respiratory illness, nondiagnostic xray. Chest Pain; Respiratory illness, nondiagnostic xray; The pt has been having rt posterior chest pain since yesterday with weakness and she has been sleeping many hours and can only stay awake for 2-3 hours now dilaysis  pt that was dialyzed Friday fistula lt arm. FINDINGS: MEDIASTINUM: Heart is normal in size. Moderate 3-vessel coronary atherosclerosis. Mild thoracic aortic atherosclerosis. LYMPH NODES: No mediastinal, hilar or axillary lymphadenopathy. LUNGS AND PLEURA: Status post left lower lobe wedge resection with associated scarring / rounded atelectasis, chronic. Faint peribronchovascular ground-glass opacity in the central right upper lobe (image 47), possibly reflecting very mild infection / inflammation. No pleural effusion or pneumothorax. SOFT TISSUES/BONES: No acute abnormality of the bones or soft tissues. UPPER ABDOMEN: Right renal cortical scarring / atrophy. Limited images of the upper abdomen demonstrates no other acute abnormality. IMPRESSION: 1. Faint peribronchovascular ground-glass opacity in the central right upper lobe, possibly reflecting very mild infection or inflammation. 2. Status post left lower lobe wedge resection with associated scarring and rounded atelectasis, chronic. Electronically signed by: Pinkie Pebbles MD 06/21/2024 03:51 AM EDT RP Workstation: [id number]   DG Chest 2 View Result Date: 06/21/2024 CLINICAL DATA:  Posterior right-sided chest pain. EXAM: CHEST - 2 VIEW COMPARISON:  January 06, 2024 FINDINGS: The cardiac silhouette is mildly enlarged and unchanged in size. Mild prominence of the central pulmonary vasculature is noted. Stable surgical sutures, linear scarring and atelectatic changes are seen within the retrocardiac region of the left lung base. The visualized skeletal structures are unremarkable. IMPRESSION: 1. Stable postoperative changes, scarring and atelectasis within the left lung base. 2. Stable cardiomegaly with mild central pulmonary vascular congestion. Electronically Signed   By: Suzen Dials M.D.   On: 06/21/2024 01:15     Procedures   Medications Ordered in the ED  Chlorhexidine  Gluconate Cloth 2 % PADS 6 each (has no administration in time range)   hydrALAZINE  (APRESOLINE ) tablet 50 mg (50 mg Oral Given 06/21/24 0403)  sodium zirconium cyclosilicate  (LOKELMA ) packet 10 g (10 g Oral Given 06/21/24 0403)  doxycycline  (VIBRA -TABS) tablet 100 mg (100 mg Oral Given 06/21/24 0437)                                    Medical Decision Making Amount and/or Complexity of Data Reviewed Radiology: ordered.  Risk Prescription drug  management.   This patient presents to the ED for concern of multiple complaints, this involves an extensive number of treatment options, and is a complaint that carries with it a high risk of complications and morbidity.  The differential diagnosis includes pneumonia, pleural effusion, pericarditis, ACS, metabolic derangements   Co morbidities / Chronic conditions that complicate the patient evaluation  DM, HTN, chronic pain, anemia, ESRD, OSA, CAD, GERD, fibromyalgia, asthma   Additional history obtained:  Additional history obtained from EMR External records from outside source obtained and reviewed including N/A   Lab Tests:  I Ordered, and personally interpreted labs.  The pertinent results include: Azotemia is increased from baseline.  A mild hyperkalemia is present.  Troponin slightly elevated consistent with ESRD and stable on repeat.  Anemia has baseline.  No leukocytosis is present.   Imaging Studies ordered:  I ordered imaging studies including chest x-ray, CT chest I independently visualized and interpreted imaging which showed gray-white opacity in central right upper lobe consistent with infection and/or inflammation I agree with the radiologist interpretation   Cardiac Monitoring: / EKG:  The patient was maintained on a cardiac monitor.  I personally viewed and interpreted the cardiac monitored which showed an underlying rhythm of: Sinus rhythm   Problem List / ED Course / Critical interventions / Medication management  Patient presents for right upper back pain in addition to  increased somnolence throughout the day today.  On arrival in the ED, vital signs notable for hypertension.  On exam, patient is alert and oriented.  Current breathing is unlabored.  She has no reproducible pain in area of right upper back.  No acute findings were identified on x-ray.  Will obtain CT imaging for further evaluation.  Her lab work is notable for mild hyperkalemia.  Dose of Lokelma  was ordered.  She also has a BUN of 93.  This is increased from her baseline.  This is likely a significant contributing factor to her recent fatigue and somnolence.  CT of chest did show an area of inflammation/possible infection in right upper lobe, consistent with patient's area of discomfort.  I discussed this with the patient who is in favor of antibiotics to treat possible mild infection.  Given her prolonged QTc, patient was started on doxycycline .  Patient's workup was complete prior to her scheduled outpatient dialysis.  She was encouraged to go to 6 AM session, as this would be the quickest pathway for dialysis which should improve her azotemia and her fatigue.  Patient stated that she did not feel comfortable trying to make it to her outpatient dialysis.  She did request dialysis from the ED.  I spoke with nephrologist on-call, Dr. Melia, who will arrange this.  Patient to return to the ED following dialysis for reevaluation. I ordered medication including Lokelma  for hyperkalemia, hydralazine  for hypertension, doxycycline  for possible right upper lobe pneumonia Reevaluation of the patient after these medicines showed that the patient improved I have reviewed the patients home medicines and have made adjustments as needed   Consultations Obtained:  I requested consultation with the nephrologist, Dr. Melia,  and discussed lab and imaging findings as well as pertinent plan - they recommend: Will arrange for dialysis      Final diagnoses:  Acute right-sided thoracic back pain  Somnolence  Fatigue,  unspecified type  Azotemia    ED Discharge Orders          Ordered    doxycycline  (VIBRAMYCIN ) 100 MG capsule  2 times daily  06/21/24 9571               Melvenia Motto, MD 06/21/24 [phone]

## 2024-06-21 NOTE — Consult Note (Signed)
 Renal Service Consult Note [facility]  Karen [name] 06/21/2024 Karen JONETTA Fret, MD Requesting Physician: Dr. Melvenia  Reason for Consult: ESRD patient with chest pain and sleepiness HPI: The patient is a 48 y.o. year-old w/ PMH as below who presented to ED complaining of right chest pain and sleepiness for the last 24 hours.  In ED BP 188/89, HR 70, resp rate 15, afebrile, 2 L nasal cannula.  Was 100% on room air.  BUN 93, CT of chest showed possible infection right upper lobe.  Decision was made by ED for p.o. doxycycline  for possible lung infection.  She was due to go to dialysis this morning but did not think she could make it so plans were made for her to be dialyzed here in the hospital.  She is on dialysis now.  Asked to see for dialysis.   Pt seen in dialysis unit.  No complaints other than as above.  Has been on dialysis about 2 years.  Her schedule is MWF.   ROS - denies CP, no joint pain, no HA, no blurry vision, no rash, no diarrhea, no nausea/ vomiting   Past Medical History  Past Medical History:  Diagnosis Date   ADHD (attention deficit hyperactivity disorder)    Anemia    Arthritis    Asthma    mild = rarely uses inhaler   Blindness of left eye    retinopathy   Diabetes mellitus without complication (HCC)    type 2   Dyspnea    with exertion   Fibromyalgia    GERD (gastroesophageal reflux disease)    no meds   Hypertension    Lung nodule    1.0 cm LLL lung nodule 07/16/22 CT (evaluation by [facility] Dr. 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   Ovarian mass, left 06/26/2021   Renal disorder    ESRD   Sleep apnea    does not use cpap   Past Surgical History  Past Surgical History:  Procedure Laterality Date   ABDOMINAL AORTOGRAM W/LOWER EXTREMITY N/A 06/08/2018   Procedure: ABDOMINAL AORTOGRAM W/LOWER EXTREMITY;  Surgeon: Sheree Penne Bruckner, MD;  Location: [facility] INVASIVE CV LAB;  Service: Cardiovascular;   Laterality: N/A;   ABDOMINAL HYSTERECTOMY  09/2016   AMPUTATION Right 06/11/2018   Procedure: AMPUTATION RIGHT FIFTH TOE;  Surgeon: Eliza Bruckner RAMAN, MD;  Location: [facility] OR;  Service: Vascular;  Laterality: Right;   APPENDECTOMY     AV FISTULA PLACEMENT Left    CHOLECYSTECTOMY     COLON SURGERY     COLONOSCOPY  2023   CORONARY ATHERECTOMY N/A 01/27/2024   Procedure: CORONARY ATHERECTOMY;  Surgeon: Verlin Bruckner JONETTA, MD;  Location: [facility] INVASIVE CV LAB;  Service: Cardiovascular;  Laterality: N/A;   CORONARY STENT INTERVENTION N/A 01/27/2024   Procedure: CORONARY STENT INTERVENTION;  Surgeon: Verlin Bruckner JONETTA, MD;  Location: [facility] INVASIVE CV LAB;  Service: Cardiovascular;  Laterality: N/A;   cysts removed from top of head  1978   age 64yr   EYE SURGERY Bilateral    retinal - left eye blind   EYE SURGERY Right    cataract removed with lens placement   INSERTION OF DIALYSIS CATHETER N/A 08/06/2022   Procedure: INSERTION OF RIGHT INTERNAL JUGULAR TUNNELED DIALYSIS CATHETER;  Surgeon: Eliza Bruckner RAMAN, MD;  Location: [facility] OR;  Service: Vascular;  Laterality: N/A;   LEFT HEART CATH AND CORONARY ANGIOGRAPHY N/A 01/27/2024   Procedure: LEFT HEART CATH AND  CORONARY ANGIOGRAPHY;  Surgeon: Verlin Lonni BIRCH, MD;  Location: [facility] INVASIVE CV LAB;  Service: Cardiovascular;  Laterality: N/A;   reimplantation of uterer     at age 64 yrs   REVISON OF ARTERIOVENOUS FISTULA Left 08/06/2022   Procedure: REVISON OF LEFT ARTERIOVENOUS FISTULA WITH REPAIR OF PSEUDOANEURYSMS AND SUPERFISTULIZATIONS;  Surgeon: Eliza Lonni RAMAN, MD;  Location: [facility] OR;  Service: Vascular;  Laterality: Left;   TRIGGER FINGER RELEASE     right hand ring finger   TUBAL LIGATION     UPPER GI ENDOSCOPY  2017   WISDOM TOOTH EXTRACTION     Family History  Family History  Problem Relation Age of Onset   Breast cancer Mother    Diabetes Father    Breast cancer Maternal Grandmother    Stomach cancer Maternal  Grandmother    Diabetes Paternal Grandmother    Breast cancer Maternal Aunt    COPD Maternal Aunt    COPD Maternal Aunt    Heart failure Maternal Aunt    COPD Maternal Aunt    Kidney cancer Maternal Aunt    Social History  reports that she quit smoking about 3 years ago. Her smoking use included cigarettes. She started smoking about 28 years ago. She has a 25 pack-year smoking history. She has never been exposed to tobacco smoke. She has never used smokeless tobacco. She reports that she does not currently use alcohol . She reports current drug use. Drug: Marijuana. Allergies  Allergies  Allergen Reactions   Furosemide Swelling   Heparin  Other (See Comments)    Patient relates vitreous hemorrhage after heparin . Patient relates vitreous hemorrhage after heparin .   Insulin  Aspart (Human Analog) (Yeast) Swelling    States she takes insulin  it just has mild side effects   Home medications Prior to Admission medications   Medication Sig Start Date End Date Taking? Authorizing Provider  doxycycline  (VIBRAMYCIN ) 100 MG capsule Take 1 capsule (100 mg total) by mouth 2 (two) times daily for 7 days. 06/21/24 06/28/24 Yes Melvenia Motto, MD  albuterol  (VENTOLIN  HFA) 108 (90 Base) MCG/ACT inhaler Inhale 1-2 puffs into the lungs every 6 (six) hours as needed for wheezing or shortness of breath.    [provider]  amLODipine  (NORVASC ) 10 MG tablet Take 1 tablet (10 mg total) by mouth daily. 06/12/18   Krishnan, Gokul, MD  ammonium lactate  (AMLACTIN) 12 % cream Apply 1 Application topically as needed for dry skin. 12/11/23   Joya Stabs, DPM  aspirin  EC 81 MG tablet Take 1 tablet (81 mg total) by mouth daily. Swallow whole. 02/26/24   Wyn Jackee VEAR Mickey., NP  atorvastatin  (LIPITOR) 40 MG tablet Take 1 tablet (40 mg total) by mouth daily. 02/26/24   Wyn Jackee VEAR Mickey., NP  B Complex-C-Folic Acid  (RENA-VITE PO) Take 1 tablet by mouth in the morning.    [provider]  brimonidine (ALPHAGAN)  0.2 % ophthalmic solution SMARTSIG:1 In Eye(s) Twice Daily 01/29/24   [provider]  cinacalcet  (SENSIPAR ) 30 MG tablet Take 30 mg by mouth every Monday, Wednesday, and Friday. After hemodialysis    [provider]  clopidogrel  (PLAVIX ) 75 MG tablet Take 1 tablet (75 mg total) by mouth daily. 03/01/24   Shlomo Wilbert SAUNDERS, MD  erythromycin with ethanol (THERAMYCIN) 2 % external solution Apply 1 Application topically in the morning. 04/12/21   [provider]  ethyl chloride spray Apply 1 Application topically as needed (prior to dialysis). 09/29/18   [provider]  FOSRENOL  1000 MG PACK Take 1,000-2,000 mg by mouth See admin instructions. Take 2 packets (2000 mg) by mouth with each meal & take 1 packet (1000 mg) by mouth with each snack 01/12/19   [provider]  gabapentin  (NEURONTIN ) 100 MG capsule Take 100-200 mg by mouth See admin instructions. Take 2 capsules (200 mg) by mouth every morning & take 1 capsule (100 mg) by mouth at bedtime. 03/12/21   [provider]  glucagon 1 MG injection Inject 1 mg into the muscle once as needed (low blood sugar). 06/16/18   [provider]  hydrALAZINE  (APRESOLINE ) 50 MG tablet Take 50 mg by mouth in the morning and at bedtime. Patient not taking: Reported on 06/17/2024 01/08/22   [provider]  insulin  aspart (NOVOLOG ) 100 UNIT/ML injection Inject 0-10 Units into the skin See admin instructions. Via insulin  pump    [provider]  insulin  glargine (LANTUS ) 100 UNIT/ML Solostar Pen Inject 20 Units into the skin at bedtime. Patient not taking: Reported on 06/17/2024    [provider]  Insulin  Pen Needle (BD PEN NEEDLE NANO U/F) 32G X 4 MM MISC Use with insulin  4x/day Patient not taking: Reported on 06/17/2024 12/09/19   [provider]  ketoconazole (NIZORAL) 2 % shampoo Apply 1 Application topically once a week. 09/03/21   [provider]  lidocaine  (LIDODERM ) 5  % Place 1 patch onto the skin daily. 11/27/23   [provider]  loperamide  (IMODIUM ) 2 MG capsule Take 4 mg by mouth as needed for diarrhea or loose stools. 10/15/18   [provider]  metoprolol  tartrate (LOPRESSOR ) 25 MG tablet Take (12.5mg ) half tablet twice a day except on dialysis days 02/13/24   Wyn Jackee VEAR Mickey., NP  OZEMPIC, 0.25 OR 0.5 MG/DOSE, 2 MG/3ML SOPN Inject 2 mg into the skin once a week. 01/14/24   [provider]     Vitals:   06/21/24 0858 06/21/24 0918 06/21/24 1000 06/21/24 1030  BP: (!) 146/46 (!) 130/47 (!) 151/71 (!) 127/51  Pulse: 75 73 70 70  Resp: 14 12 12 16   Temp: 98.1 F (36.7 C)     TempSrc:      SpO2:  100% 100% 100%  Weight: 88.7 kg     Height:       Exam Gen alert, no distress No rash, cyanosis or gangrene Sclera anicteric, throat clear  No jvd or bruits Chest clear bilat to bases, no rales/ wheezing RRR no MRG Abd soft ntnd no mass or ascites +bs GU deferred MS no joint effusions or deformity Ext 1+ pretibial edema, no other edema Neuro is alert, Ox 3 , nf    L AVF     OP HD: [facility] MWF From feb 2025 -->  4h  B400   85kg  2K bath  AVF  Heparin  none  CXR: no edema or congestion   Assessment/ Plan: ESRD: on HD MWF. Plan HD this am.  HTN: bp's stable, resume home meds post HD.  Volume: mild LE edema, was 100% on RA on arrival. CXR neg. UF 2-3 L as tolerated.  Back/ chest pain: possible upper lobe infection on CT, started on po doxy by ED MD.        Myer Fret  MD [facility] 06/21/2024, 11:11 AM  Recent Labs  Lab 06/15/24 1032 06/21/24 0047  HGB  --  10.4*  ALBUMIN  4.1  --   CALCIUM   --  8.6*  CREATININE  --  9.92*  K  --  5.3*   Inpatient medications:  Chlorhexidine  Gluconate Cloth  6 each Topical Q0600

## 2024-06-21 NOTE — Discharge Instructions (Addendum)
 A prescription for an antibiotic was sent to your pharmacy.  This is for treatment of a spot of inflammation/possible infection in your upper right lung.  This is likely the cause of your recent discomfort.  Take this antibiotic as prescribed.  Continue your other home medications and outpatient dialysis sessions as scheduled.  Return to the emergency department for any new or worsening symptoms of concern.

## 2024-06-21 NOTE — ED Notes (Signed)
 Nephrology at bedside

## 2024-06-21 NOTE — ED Triage Notes (Signed)
 The pt has been having rt posterior chest pain since yesterday with weakness and  she has been sleeping  many hours and can only stay awake for 2-3 hours now  dilaysis pt that was dialyzed Friday  fistula lt arm

## 2024-06-22 LAB — HEPATITIS B SURFACE ANTIBODY, QUANTITATIVE: Hep B S AB Quant (Post): 38.8 m[IU]/mL

## 2024-06-23 ENCOUNTER — Ambulatory Visit (HOSPITAL_COMMUNITY)
Admission: RE | Admit: 2024-06-23 | Source: Ambulatory Visit | Attending: Nurse Practitioner | Admitting: Nurse Practitioner

## 2024-07-07 NOTE — Progress Notes (Signed)
 This patient's chart has been reviewed by a [facility] Specialist.   Attempted to contact patient in order to discuss appointments and health maintenance screenings due for the upcoming year.   Health Maintenance Due  Topic Date Due  . DTaP/Tdap/Td Vaccines (1 - Tdap) Never done  . COVID-19 Vaccine (5 - 2024-25 season) 08/03/2023  . Diabetes Lipid Profile  01/25/2024  . Diabetes Eye Exam  06/15/2024  . Medicare Annual Wellness  08/06/2024   Mammogram 09/22/2024     Left message with [facility] Specialist's contact information. and [employer] message with health maintenance recommendations and [facility] Specialist's contact information..  Additional Comments: n/a [facility]

## 2024-07-08 ENCOUNTER — Ambulatory Visit (INDEPENDENT_AMBULATORY_CARE_PROVIDER_SITE_OTHER): Admitting: Podiatry

## 2024-07-08 ENCOUNTER — Encounter: Payer: Self-pay | Admitting: Podiatry

## 2024-07-08 ENCOUNTER — Ambulatory Visit (INDEPENDENT_AMBULATORY_CARE_PROVIDER_SITE_OTHER)

## 2024-07-08 DIAGNOSIS — M14671 Charcot's joint, right ankle and foot: Secondary | ICD-10-CM

## 2024-07-08 DIAGNOSIS — E1140 Type 2 diabetes mellitus with diabetic neuropathy, unspecified: Secondary | ICD-10-CM | POA: Diagnosis not present

## 2024-07-08 DIAGNOSIS — E1149 Type 2 diabetes mellitus with other diabetic neurological complication: Secondary | ICD-10-CM | POA: Diagnosis not present

## 2024-07-08 DIAGNOSIS — M14679 Charcot's joint, unspecified ankle and foot: Secondary | ICD-10-CM

## 2024-07-08 NOTE — Progress Notes (Signed)
 Chief Complaint  Patient presents with   Foot Swelling    Right foot swelling since July 25th. E.R visit and was prescribed Clindamycin . X rays taken. No feeling in the foot IDDM A1C 7.2.     HPI: 48 y.o. female presents today with concern for right foot redness and swelling.  She was recently for pneumonia and started on doxycycline .  She noticed worsening redness and swelling about 5 days ago and went to an outside urgent care for this and was started on clindamycin .  They state that the swelling has gone down substantially.  The foot was quite warm at that time as well, there is still some residual swelling and warmth present.  She denies any known trauma, she does report severe neuropathy and has very diminished sensation to both feet.  Does report significant neuropathy and is on dialysis as well.  Past Medical History:  Diagnosis Date   ADHD (attention deficit hyperactivity disorder)    Anemia    Arthritis    Asthma    mild = rarely uses inhaler   Blindness of left eye    retinopathy   Diabetes mellitus without complication (HCC)    type 2   Dyspnea    with exertion   Fibromyalgia    GERD (gastroesophageal reflux disease)    no meds   Hypertension    Lung nodule    1.0 cm LLL lung nodule 07/16/22 CT (evaluation by [facility] Dr. Allyne Render)   Osteomyelitis [facility])    RIGHT FOOT FIFTH TOE   Ovarian mass, left 06/26/2021   Renal disorder    ESRD   Sleep apnea    does not use cpap    Past Surgical History:  Procedure Laterality Date   ABDOMINAL AORTOGRAM W/LOWER EXTREMITY N/A 06/08/2018   Procedure: ABDOMINAL AORTOGRAM W/LOWER EXTREMITY;  Surgeon: Sheree Penne Bruckner, MD;  Location: [facility] INVASIVE CV LAB;  Service: Cardiovascular;  Laterality: N/A;   ABDOMINAL HYSTERECTOMY  09/2016   AMPUTATION Right 06/11/2018   Procedure: AMPUTATION RIGHT FIFTH TOE;  Surgeon: Eliza Bruckner RAMAN, MD;  Location: [facility] OR;  Service: Vascular;  Laterality: Right;   APPENDECTOMY      AV FISTULA PLACEMENT Left    CHOLECYSTECTOMY     COLON SURGERY     COLONOSCOPY  2023   CORONARY ATHERECTOMY N/A 01/27/2024   Procedure: CORONARY ATHERECTOMY;  Surgeon: Verlin Bruckner BIRCH, MD;  Location: [facility] INVASIVE CV LAB;  Service: Cardiovascular;  Laterality: N/A;   CORONARY STENT INTERVENTION N/A 01/27/2024   Procedure: CORONARY STENT INTERVENTION;  Surgeon: Verlin Bruckner BIRCH, MD;  Location: [facility] INVASIVE CV LAB;  Service: Cardiovascular;  Laterality: N/A;   cysts removed from top of head  66   age 62yr   EYE SURGERY Bilateral    retinal - left eye blind   EYE SURGERY Right    cataract removed with lens placement   INSERTION OF DIALYSIS CATHETER N/A 08/06/2022   Procedure: INSERTION OF RIGHT INTERNAL JUGULAR TUNNELED DIALYSIS CATHETER;  Surgeon: Eliza Bruckner RAMAN, MD;  Location: [facility] OR;  Service: Vascular;  Laterality: N/A;   LEFT HEART CATH AND CORONARY ANGIOGRAPHY N/A 01/27/2024   Procedure: LEFT HEART CATH AND CORONARY ANGIOGRAPHY;  Surgeon: Verlin Bruckner BIRCH, MD;  Location: [facility] INVASIVE CV LAB;  Service: Cardiovascular;  Laterality: N/A;   reimplantation of uterer     at age 39 yrs   REVISON OF ARTERIOVENOUS FISTULA Left 08/06/2022   Procedure: REVISON OF LEFT ARTERIOVENOUS FISTULA  WITH REPAIR OF PSEUDOANEURYSMS AND SUPERFISTULIZATIONS;  Surgeon: Eliza Lonni RAMAN, MD;  Location: [facility] OR;  Service: Vascular;  Laterality: Left;   TRIGGER FINGER RELEASE     right hand ring finger   TUBAL LIGATION     UPPER GI ENDOSCOPY  2017   WISDOM TOOTH EXTRACTION      Allergies  Allergen Reactions   Furosemide Swelling   Heparin  Other (See Comments)    Patient relates vitreous hemorrhage after heparin . Patient relates vitreous hemorrhage after heparin .   Insulin  Aspart (Human Analog) (Yeast) Swelling    States she takes insulin  it just has mild side effects    ROS    Physical Exam: There were no vitals filed for this visit.  General: The patient is alert and oriented  x3 in no acute distress.  Dermatology: No open wounds appreciated.  No ulcerations appreciated.  Edema appreciated to the right foot with some induration to right dorsal midfoot.  Vascular: Palpable pedal pulses bilaterally. Capillary refill within normal limits.  Pedal hair growth absent.  Edema present to right foot versus left foot.  Mild erythema present.  Neurological: Light touch sensation decreased bilaterally sensation absent.   Musculoskeletal Exam: Prior partial fifth ray resection right foot. Some abduction of the right midfoot.  No plantar prominence is noted.  Range of motion testing deferred  Radiographic Exam: Right foot 3 views partial weightbearing 07/08/2024 Vascular calcifications noted prior partial third ray resection noted.  There is fracture dislocation of the abduction of the midfoot at the [place] joints.  Some evidence of fragmentation is appreciated dorsally and plantarly.  There is a break in Meary's angle on lateral view present  Assessment/Plan of Care: 1. Charcot arthropathy of midfoot      No orders of the defined types were placed in this encounter.  DG FOOT COMPLETE RIGHT  Discussed clinical findings with patient today.  Radiographs were reviewed with the patient in detail.  Findings consistent with acute Charcot arthropathy.  Recommend that she complete her antibiotics.  Strongly favor Charcot over factious cause.  Ordering CBC, CRP and sed rate as well, do expect to see some elevation of inflammatory markers. - Recommend strict cam boot immobilization today - Recommend nonweightbearing.  Patient may still need to use the foot for transfers.  She does use a wheelchair and a rollator at home. - Monitor for signs of worsening redness, swelling, pain, deformity - Will have her return to Dr. Sikora for serial x-rays in 2 to 3 weeks. - Surgical correction can be avoided if appropriate immobilization weightbearing precautions are maintained, did discuss that she may  need custom bracing in the future - Did explain that without adequate mobilization, could lead to progressive deformity that could become limb threatening and have functional limitations.     Emerson Barretto L. Lamount MAUL, AACFAS [facility]     [address] [place], [place] [address]                Office [phone]  Fax [phone]

## 2024-07-08 NOTE — Patient Instructions (Signed)
 Maintain nonweightbearing to the right foot is much as possible.  Keep cam boot applied is much as possible including when in bed.  Monitor for signs of worsening swelling, redness, pain, deformity, if this happens this could be a sign that the Charcot Neuroarthropathy is progressing.  The goal is to keep the foot as stable as possible and weightbearing until the bones and joints consolidate.

## 2024-07-14 ENCOUNTER — Ambulatory Visit (HOSPITAL_COMMUNITY)
Admission: RE | Admit: 2024-07-14 | Discharge: 2024-07-14 | Disposition: A | Discharge status: Home / Self Care | Source: Ambulatory Visit | Attending: Cardiology | Admitting: Cardiology

## 2024-07-14 DIAGNOSIS — I10 Essential (primary) hypertension: Secondary | ICD-10-CM | POA: Insufficient documentation

## 2024-07-14 DIAGNOSIS — Z794 Long term (current) use of insulin: Secondary | ICD-10-CM | POA: Diagnosis present

## 2024-07-14 DIAGNOSIS — I2510 Atherosclerotic heart disease of native coronary artery without angina pectoris: Secondary | ICD-10-CM | POA: Diagnosis present

## 2024-07-14 DIAGNOSIS — E785 Hyperlipidemia, unspecified: Secondary | ICD-10-CM | POA: Insufficient documentation

## 2024-07-14 DIAGNOSIS — E11319 Type 2 diabetes mellitus with unspecified diabetic retinopathy without macular edema: Secondary | ICD-10-CM | POA: Insufficient documentation

## 2024-07-14 DIAGNOSIS — Z992 Dependence on renal dialysis: Secondary | ICD-10-CM | POA: Insufficient documentation

## 2024-07-14 DIAGNOSIS — N186 End stage renal disease: Secondary | ICD-10-CM | POA: Diagnosis present

## 2024-07-14 LAB — ECHOCARDIOGRAM COMPLETE
Area-P 1/2: 3.34 cm2
S' Lateral: 4 cm

## 2024-07-15 ENCOUNTER — Ambulatory Visit: Payer: Self-pay | Admitting: General Practice

## 2024-07-15 LAB — CBC WITH DIFFERENTIAL/PLATELET
Basophils Absolute: 0.1 x10E3/uL (ref 0.0–0.2)
Basos: 1 %
EOS (ABSOLUTE): 0.3 x10E3/uL (ref 0.0–0.4)
Eos: 4 %
Hematocrit: 32.7 % — ABNORMAL LOW (ref 34.0–46.6)
Hemoglobin: 10.7 g/dL — ABNORMAL LOW (ref 11.1–15.9)
Immature Grans (Abs): 0 x10E3/uL (ref 0.0–0.1)
Immature Granulocytes: 0 %
Lymphocytes Absolute: 1.6 x10E3/uL (ref 0.7–3.1)
Lymphs: 22 %
MCH: 31 pg (ref 26.6–33.0)
MCHC: 32.7 g/dL (ref 31.5–35.7)
MCV: 95 fL (ref 79–97)
Monocytes Absolute: 0.7 x10E3/uL (ref 0.1–0.9)
Monocytes: 10 %
Neutrophils Absolute: 4.7 x10E3/uL (ref 1.4–7.0)
Neutrophils: 63 %
Platelets: 339 x10E3/uL (ref 150–450)
RBC: 3.45 x10E6/uL — ABNORMAL LOW (ref 3.77–5.28)
RDW: 13 % (ref 11.7–15.4)
WBC: 7.5 x10E3/uL (ref 3.4–10.8)

## 2024-07-15 LAB — LIPID PANEL
Chol/HDL Ratio: 4.4 ratio (ref 0.0–4.4)
Cholesterol, Total: 208 mg/dL — ABNORMAL HIGH (ref 100–199)
HDL: 47 mg/dL (ref >39)
LDL Chol Calc (NIH): 136 mg/dL — ABNORMAL HIGH (ref 0–99)
Triglycerides: 138 mg/dL (ref 0–149)
VLDL Cholesterol Cal: 25 mg/dL (ref 5–40)

## 2024-07-15 LAB — HEPATIC FUNCTION PANEL
ALT: 32 IU/L (ref 0–32)
AST: 31 IU/L (ref 0–40)
Albumin: 4.2 g/dL (ref 3.9–4.9)
Alkaline Phosphatase: 532 IU/L — ABNORMAL HIGH (ref 44–121)
Bilirubin Total: 0.5 mg/dL (ref 0.0–1.2)
Bilirubin, Direct: 0.18 mg/dL (ref 0.00–0.40)
Total Protein: 7.2 g/dL (ref 6.0–8.5)

## 2024-07-15 LAB — SEDIMENTATION RATE: Sed Rate: 63 mm/h — ABNORMAL HIGH (ref 0–32)

## 2024-07-15 LAB — C-REACTIVE PROTEIN: CRP: 26 mg/L — ABNORMAL HIGH (ref 0–10)

## 2024-07-18 NOTE — Progress Notes (Unsigned)
 Cardiology Office Note    Patient Name: Karen [name] Date of Encounter: 07/18/2024  Primary Care Provider:  Lesa Jon HERO, PA Primary Cardiologist:  None Primary Electrophysiologist: None   Past Medical History    Past Medical History:  Diagnosis Date   ADHD (attention deficit hyperactivity disorder)    Anemia    Arthritis    Asthma    mild = rarely uses inhaler   Blindness of left eye    retinopathy   Diabetes mellitus without complication (HCC)    type 2   Dyspnea    with exertion   Fibromyalgia    GERD (gastroesophageal reflux disease)    no meds   Hypertension    Lung nodule    1.0 cm LLL lung nodule 07/16/22 CT (evaluation by [facility] Dr. Allyne Render)   Osteomyelitis [facility])    RIGHT FOOT FIFTH TOE   Ovarian mass, left 06/26/2021   Renal disorder    ESRD   Sleep apnea    does not use cpap    History of Present Illness  Karen [name] is a 48 y.o. female with a PMH of CAD s/p NSTEMI with heavily calcified 99% stenosis in the mid LAD treated with orbital arthrectomy and DES x 1, and severe stenosis in distal circumflex too small for PCI, HLD, ESRD (M, W, FR), remote history of endocarditis, VATS s/p lobectomy 2021, DM type II, HTN, fibromyalgia, GERD, OSA (not on CPAP), asthma who presents today for 6 month follow up.  Karen [name] was last seen on 02/13/2024 following PCI. She underwent an LHC that showed 20% proximal to mid RCA 20% ostial left circumflex to proximal left circumflex 9% distal left circumflex and 99% mid LAD treated with DES to distal and distal circumflex too small for PCI. She was started on Brilinta  and ASA postprocedure.  She reported increased fatigue and decreased blood pressure.  She had Toprol -XL discontinued and was started on metoprolol  12.5 mg twice daily.   Patient denies chest pain, palpitations, dyspnea, PND, orthopnea, nausea, vomiting, dizziness, syncope, edema, weight gain, or early satiety.   Discussed the use  of AI scribe software for clinical note transcription with the patient, who gave verbal consent to proceed.  History of Present Illness    ***Notes: Patient's last 2D echo showed EF of 40-45% and will need repeat 2D echo   Review of Systems  Please see the history of present illness.    All other systems reviewed and are otherwise negative except as noted above.  Physical Exam    Wt Readings from Last 3 Encounters:  06/21/24 195 lb 8.8 oz (88.7 kg)  01/28/24 195 lb 8.8 oz (88.7 kg)  09/30/22 190 lb 11.2 oz (86.5 kg)   CD:Uyzmz were no vitals filed for this visit.,There is no height or weight on file to calculate BMI. GEN: Well nourished, well developed in no acute distress Neck: No JVD; No carotid bruits Pulmonary: Clear to auscultation without rales, wheezing or rhonchi  Cardiovascular: Normal rate. Regular rhythm. Normal S1. Normal S2.   Murmurs: There is no murmur.  ABDOMEN: Soft, non-tender, non-distended EXTREMITIES:  No edema; No deformity   EKG/LABS/ Recent Cardiac Studies   ECG personally reviewed by me today - ***  Risk Assessment/Calculations:   {Does this patient have ATRIAL FIBRILLATION?:[phone]}      Lab Results  Component Value Date   WBC 7.5 07/14/2024   HGB 10.7 (L) 07/14/2024   HCT 32.7 (L) 07/14/2024   MCV 95 07/14/2024  PLT 339 07/14/2024   Lab Results  Component Value Date   CREATININE 9.92 (H) 06/21/2024   BUN 93 (H) 06/21/2024   NA 139 06/21/2024   K 5.3 (H) 06/21/2024   CL 100 06/21/2024   CO2 24 06/21/2024   Lab Results  Component Value Date   CHOL 208 (H) 07/14/2024   HDL 47 07/14/2024   LDLCALC 136 (H) 07/14/2024   TRIG 138 07/14/2024   CHOLHDL 4.4 07/14/2024    Lab Results  Component Value Date   HGBA1C 7.4 (H) 01/27/2024   Assessment & Plan    Assessment and Plan Assessment & Plan     1.CAD: -s/p LHC due to NSTEMI with heavily calcified 99% stenosis in the mid LAD treated with orbital arthrectomy and DES x 1,  and severe stenosis in distal circumflex too small for PCI -Today patient reports  2. Essential HTN: -Patient's blood pressure today was  3.Hyperlipidemia: -Patient's last LDL   4. History of ESRD: -Dialyzed Monday, Wednesday, Friday -continue current treatment plan per nephrology  5. HFmrEF: -Patient's EF was 40-45% RWMA mild concentric LVH with grade 1 DD -Current volume status managed by dialysis.      Disposition: Follow-up with None or APP in *** months {Are you ordering a CV Procedure (e.g. stress test, cath, DCCV, TEE, etc)?   Press F2        :[id number]}   Signed, Wyn Raddle, Jackee Shove, NP 07/18/2024, 1:04 PM [facility]

## 2024-07-19 ENCOUNTER — Ambulatory Visit: Attending: Nurse Practitioner | Admitting: Nurse Practitioner

## 2024-07-19 ENCOUNTER — Encounter: Payer: Self-pay | Admitting: Nurse Practitioner

## 2024-07-19 VITALS — BP 118/70 | HR 73 | Ht 65.0 in | Wt 198.4 lb

## 2024-07-19 DIAGNOSIS — N186 End stage renal disease: Secondary | ICD-10-CM

## 2024-07-19 DIAGNOSIS — Z992 Dependence on renal dialysis: Secondary | ICD-10-CM

## 2024-07-19 DIAGNOSIS — I5040 Unspecified combined systolic (congestive) and diastolic (congestive) heart failure: Secondary | ICD-10-CM

## 2024-07-19 DIAGNOSIS — I2510 Atherosclerotic heart disease of native coronary artery without angina pectoris: Secondary | ICD-10-CM | POA: Diagnosis not present

## 2024-07-19 DIAGNOSIS — I10 Essential (primary) hypertension: Secondary | ICD-10-CM | POA: Diagnosis not present

## 2024-07-19 DIAGNOSIS — E785 Hyperlipidemia, unspecified: Secondary | ICD-10-CM

## 2024-07-19 NOTE — Patient Instructions (Signed)
 Medication Instructions:  Your physician recommends that you continue on your current medications as directed. Please refer to the Current Medication list given to you today. *If you need a refill on your cardiac medications before your next appointment, please call your pharmacy*  Lab Work: NONE ORDERED If you have labs (blood work) drawn today and your tests are completely normal, you will receive your results only by: MyChart Message (if you have MyChart) OR A paper copy in the mail If you have any lab test that is abnormal or we need to change your treatment, we will call you to review the results.  Testing/Procedures: NONE ORDERED  Follow-Up: At [facility], you and your health needs are our priority.  As part of our continuing mission to provide you with exceptional heart care, our providers are all part of one team.  This team includes your primary Cardiologist (physician) and Advanced Practice Providers or APPs (Physician Assistants and Nurse Practitioners) who all work together to provide you with the care you need, when you need it.  Your next appointment:   6 month(s)  Provider:  Wilbert Bihari, MD   We recommend signing up for the patient portal called MyChart.  Sign up information is provided on this After Visit Summary.  MyChart is used to connect with patients for Virtual Visits (Telemedicine).  Patients are able to view lab/test results, encounter notes, upcoming appointments, etc.  Non-urgent messages can be sent to your provider as well.   To learn more about what you can do with MyChart, go to [email or web address].   Other Instructions You have been referred to PHARM-D(DISCUSS PCSK9)

## 2024-07-20 ENCOUNTER — Ambulatory Visit (INDEPENDENT_AMBULATORY_CARE_PROVIDER_SITE_OTHER): Admitting: Podiatry

## 2024-07-20 ENCOUNTER — Ambulatory Visit: Admitting: Pharmacist

## 2024-07-20 DIAGNOSIS — T25021A Burn of unspecified degree of right foot, initial encounter: Secondary | ICD-10-CM | POA: Diagnosis not present

## 2024-07-20 DIAGNOSIS — T25022A Burn of unspecified degree of left foot, initial encounter: Secondary | ICD-10-CM | POA: Diagnosis not present

## 2024-07-20 MED ORDER — DOXYCYCLINE HYCLATE 100 MG PO TABS: 100.0000 mg | ORAL_TABLET | Freq: Two times a day (BID) | ORAL | 0 refills | Status: DC

## 2024-07-20 MED ORDER — SILVER SULFADIAZINE 1 % EX CREA: TOPICAL_CREAM | Freq: Two times a day (BID) | CUTANEOUS | Status: DC
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
## 2024-07-21 ENCOUNTER — Other Ambulatory Visit: Payer: Self-pay | Admitting: Podiatry

## 2024-07-21 ENCOUNTER — Telehealth: Payer: Self-pay | Admitting: Podiatry

## 2024-07-21 ENCOUNTER — Other Ambulatory Visit: Payer: Self-pay

## 2024-07-21 MED ORDER — SILVER SULFADIAZINE 1 % EX CREA
1.0000 | TOPICAL_CREAM | Freq: Two times a day (BID) | CUTANEOUS | 0 refills | Status: DC
Start: 2024-07-21 — End: 2024-08-05

## 2024-07-21 MED ORDER — METOPROLOL TARTRATE 25 MG PO TABS: ORAL_TABLET | ORAL | 1 refills | Status: DC

## 2024-07-21 NOTE — Telephone Encounter (Signed)
 Patient called and said that her pharmacy hasn't received her prescription for Silvadene  cream. She is concerned about not receiving the prescription and having to wait till tomorrow. Her preferred pharmacy is [facility] in [place]. Thank you.

## 2024-07-21 NOTE — Telephone Encounter (Signed)
 Patient is requesting to have supples shipped to her. She was told by assistance that she will give her some supplies, because she does not feel like sending any paperwork. As per patient she was not given enough supplies to last her, and she was not happy about the way the assistant addressed her. Pharmacy did not receive order for Silverdene (Topical Cream)

## 2024-07-29 ENCOUNTER — Encounter: Payer: Self-pay | Admitting: Podiatry

## 2024-07-29 ENCOUNTER — Ambulatory Visit (INDEPENDENT_AMBULATORY_CARE_PROVIDER_SITE_OTHER)

## 2024-07-29 ENCOUNTER — Ambulatory Visit (INDEPENDENT_AMBULATORY_CARE_PROVIDER_SITE_OTHER): Admitting: Podiatry

## 2024-07-29 VITALS — BP 143/81 | HR 71 | Temp 98.1°F

## 2024-07-29 DIAGNOSIS — T25021D Burn of unspecified degree of right foot, subsequent encounter: Secondary | ICD-10-CM

## 2024-07-29 DIAGNOSIS — M14679 Charcot's joint, unspecified ankle and foot: Secondary | ICD-10-CM | POA: Diagnosis not present

## 2024-07-29 DIAGNOSIS — E1140 Type 2 diabetes mellitus with diabetic neuropathy, unspecified: Secondary | ICD-10-CM | POA: Diagnosis not present

## 2024-07-29 DIAGNOSIS — M14671 Charcot's joint, right ankle and foot: Secondary | ICD-10-CM

## 2024-07-29 DIAGNOSIS — T25022D Burn of unspecified degree of left foot, subsequent encounter: Secondary | ICD-10-CM

## 2024-07-29 MED ORDER — DOXYCYCLINE HYCLATE 100 MG PO TABS: 100.0000 mg | ORAL_TABLET | Freq: Two times a day (BID) | ORAL | 0 refills | Status: AC
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
## 2024-08-05 ENCOUNTER — Telehealth: Payer: Self-pay | Admitting: Podiatry

## 2024-08-05 ENCOUNTER — Other Ambulatory Visit: Payer: Self-pay | Admitting: Podiatry

## 2024-08-05 MED ORDER — SILVER SULFADIAZINE 1 % EX CREA: 1.0000 | TOPICAL_CREAM | Freq: Two times a day (BID) | CUTANEOUS | 0 refills | Status: AC

## 2024-08-05 NOTE — Telephone Encounter (Signed)
 Patient stated she has 0 refills for (SILVADENE ) cream, Please send in order for cream.

## 2024-08-11 ENCOUNTER — Encounter: Payer: Self-pay | Admitting: Podiatry

## 2024-08-11 ENCOUNTER — Ambulatory Visit (INDEPENDENT_AMBULATORY_CARE_PROVIDER_SITE_OTHER): Admitting: Podiatry

## 2024-08-11 VITALS — BP 102/54 | HR 72 | Temp 99.3°F

## 2024-08-11 DIAGNOSIS — E1140 Type 2 diabetes mellitus with diabetic neuropathy, unspecified: Secondary | ICD-10-CM

## 2024-08-11 DIAGNOSIS — T25321D Burn of third degree of right foot, subsequent encounter: Secondary | ICD-10-CM

## 2024-08-11 DIAGNOSIS — T25322D Burn of third degree of left foot, subsequent encounter: Secondary | ICD-10-CM

## 2024-08-11 DIAGNOSIS — E1149 Type 2 diabetes mellitus with other diabetic neurological complication: Secondary | ICD-10-CM | POA: Diagnosis not present

## 2024-08-11 MED ORDER — SANTYL 250 UNIT/GM EX OINT: 1.0000 | TOPICAL_OINTMENT | Freq: Every day | CUTANEOUS | 0 refills | Status: DC
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
## 2024-08-13 ENCOUNTER — Telehealth: Payer: Self-pay

## 2024-08-13 NOTE — Telephone Encounter (Addendum)
 PA request received for Santyl  ointment. PA submitted and waiting on response.  Marshall Balint bacon  (Key:(Key: [id number])

## 2024-08-23 ENCOUNTER — Ambulatory Visit (INDEPENDENT_AMBULATORY_CARE_PROVIDER_SITE_OTHER): Admitting: Podiatry

## 2024-08-23 ENCOUNTER — Encounter: Payer: Self-pay | Admitting: Podiatry

## 2024-08-23 DIAGNOSIS — T25322D Burn of third degree of left foot, subsequent encounter: Secondary | ICD-10-CM | POA: Diagnosis not present

## 2024-08-23 DIAGNOSIS — T25321D Burn of third degree of right foot, subsequent encounter: Secondary | ICD-10-CM | POA: Diagnosis not present

## 2024-08-23 DIAGNOSIS — E1149 Type 2 diabetes mellitus with other diabetic neurological complication: Secondary | ICD-10-CM

## 2024-08-23 DIAGNOSIS — E1140 Type 2 diabetes mellitus with diabetic neuropathy, unspecified: Secondary | ICD-10-CM

## 2024-08-23 MED ORDER — SANTYL 250 UNIT/GM EX OINT: 1.0000 | TOPICAL_OINTMENT | Freq: Every day | CUTANEOUS | 0 refills | Status: DC
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
## 2024-08-24 NOTE — Telephone Encounter (Signed)
 PA was approved.

## 2024-08-31 ENCOUNTER — Encounter: Payer: Self-pay | Admitting: Pharmacist

## 2024-08-31 ENCOUNTER — Ambulatory Visit: Attending: Cardiology | Admitting: Pharmacist

## 2024-08-31 DIAGNOSIS — I2510 Atherosclerotic heart disease of native coronary artery without angina pectoris: Secondary | ICD-10-CM | POA: Diagnosis not present

## 2024-08-31 DIAGNOSIS — E785 Hyperlipidemia, unspecified: Secondary | ICD-10-CM

## 2024-08-31 NOTE — Patient Instructions (Addendum)
 It was nice meeting you today  We would like your LDL (bad cholesterol) to be less than 55  The medication we discussed today is called Repatha, which is an injection you would take once every 2 weeks  I will complete the prior authorization for you and contact you when it is approved  Once you start the new medication we would like to recheck your fasting lipid panel in 2-3 months  Please let us  know if you have any questions   Medford Bolk, PharmD, BCACP, CDCES, CPP [facility] [address] [place], [place] [address] Phone: [phone]; Fax: [phone] 08/31/2024 10:46 AM

## 2024-08-31 NOTE — Progress Notes (Signed)
 Patient ID: Karen [name]                 DOB: [birth year]                    MRN: [id number]     HPI: Karen [name] is a 48 y.o. female patient referred to lipid clinic by Karen [name]. PMH is significant for NSTEMI, HTN, T2DM, HLD, and CKD.   Patient presents today with Karen [name] and Karen [name]. Presents in wheelchair. Has dialysis on MWF.  Previously on atorvastatin  40mg  once daily and tolerated well. On follow up lab work, Alk Phos increased to 461 and ALT to 46. Atorvastatin  was discontinued.  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 Last eGFR 29ml/min.  Admitted at [facility] for NSTEMI on 01/26/24.  Prox RCA to Mid RCA lesion is 20% stenosed.   Ost Cx to Prox Cx lesion is 20% stenosed.   Dist Cx lesion is 99% stenosed.   Mid LAD lesion is 99% stenosed.   Mid LAD to Dist LAD lesion is 30% stenosed.   A drug-eluting stent was successfully placed using a SYNERGY XD [id number].   Post intervention, there is a 0% residual stenosis.    Current Medications: N/A  Intolerances:  Statins (due to elevated LFTs)  Risk Factors:  CAD T2DM Hx of NSTEMI HTN CKD  LDL goal: <55  Labs: TC 208, Trigs 138, HDL 47, LDL 136 (07/14/24)  Past Medical History:  Diagnosis Date   ADHD (attention deficit hyperactivity disorder)    Anemia    Arthritis    Asthma    mild = rarely uses inhaler   Blindness of left eye    retinopathy   Diabetes mellitus without complication (HCC)    type 2   Dyspnea    with exertion   Fibromyalgia    GERD (gastroesophageal reflux disease)    no meds   Hypertension    Lung nodule    1.0 cm LLL lung nodule 07/16/22 CT (evaluation by [facility] Dr. Allyne Render)   Osteomyelitis [facility])    RIGHT FOOT FIFTH TOE   Ovarian mass, left 06/26/2021   Renal disorder    ESRD   Sleep apnea    does not use cpap    Current Outpatient Medications on File Prior to Visit  Medication Sig Dispense Refill   albuterol  (VENTOLIN  HFA) 108 (90 Base)  MCG/ACT inhaler Inhale 1-2 puffs into the lungs every 6 (six) hours as needed for wheezing or shortness of breath.     ammonium lactate  (AMLACTIN) 12 % cream Apply 1 Application topically as needed for dry skin. 385 g 0   aspirin  EC 81 MG tablet Take 1 tablet (81 mg total) by mouth daily. Swallow whole. 90 tablet 3   B Complex-C-Folic Acid  (RENA-VITE PO) Take 1 tablet by mouth in the morning.     brimonidine (ALPHAGAN) 0.2 % ophthalmic solution SMARTSIG:1 In Eye(s) Twice Daily     cinacalcet  (SENSIPAR ) 30 MG tablet Take 90 mg by mouth daily with breakfast. After hemodialysis     clopidogrel  (PLAVIX ) 75 MG tablet Take 1 tablet (75 mg total) by mouth daily. (Patient not taking: Reported on 08/23/2024) 90 tablet 3   collagenase  (SANTYL ) 250 UNIT/GM ointment Apply 1 Application topically daily. Wound dimension (3 cmx 2.5 cm x 0.2 cm, 1 cm x 3.5 cm x 0.2 cm) Dispense QTY sufficient per manufactures dosing calculator. 15 g 0   ethyl chloride spray Apply 1 Application topically as needed (  prior to dialysis).  2   FOSRENOL  1000 MG PACK Take 1,000-2,000 mg by mouth See admin instructions. Take 2 packets (2000 mg) by mouth with each meal & take 1 packet (1000 mg) by mouth with each snack     gabapentin  (NEURONTIN ) 100 MG capsule Take 100-200 mg by mouth See admin instructions. Take 2 capsules (200 mg) by mouth every morning & take 1 capsule (100 mg) by mouth at bedtime.     glucagon 1 MG injection Inject 1 mg into the muscle once as needed (low blood sugar).     insulin  aspart (NOVOLOG ) 100 UNIT/ML injection Inject 0-10 Units into the skin See admin instructions. Via insulin  pump     ketoconazole (NIZORAL) 2 % shampoo Apply 1 Application topically once a week.     lidocaine  (LIDODERM ) 5 % Place 1 patch onto the skin daily.     metoprolol  tartrate (LOPRESSOR ) 25 MG tablet Take (12.5mg ) half tablet twice a day except on dialysis days 180 tablet 1   OZEMPIC, 0.25 OR 0.5 MG/DOSE, 2 MG/3ML SOPN Inject 2 mg into the  skin once a week.     silver  sulfADIAZINE  (SILVADENE ) 1 % cream Apply 1 Application topically 2 (two) times daily. 60 g 0   Current Facility-Administered Medications on File Prior to Visit  Medication Dose Route Frequency Provider Last Rate Last Admin   silver  sulfADIAZINE  (SILVADENE ) 1 % cream   Topical BID Christine Rush, DPM        Allergies  Allergen Reactions   Furosemide Swelling   Heparin  Other (See Comments)    Patient relates vitreous hemorrhage after heparin . Patient relates vitreous hemorrhage after heparin .   Insulin  Aspart (Human Analog) (Yeast) Swelling    States she takes insulin  it just has mild side effects    Assessment/Plan:  1. Hyperlipidemia - Patient's LDL of 136 is above goal of <55.  Aggressive goal due to history of NSTEMI and T2DM. Since patient can not take statins, recommend addition of PCSK9i.  Using demo pen, educated patient on mechanism of action, storage, site selection, administration, and possible adverse effects. Will complete PA and contact patient with response. Recheck FLP in 2-3 months.  Start Repatha/Praluent q 2 weeks Recheck lipid panel in 2-3 months  Chris Genette Huertas, PharmD, BCACP, CDCES, CPP [facility] [address] [place], [place] [address] Phone: [phone]; Fax: [phone] 08/31/2024 5:04 PM

## 2024-09-06 ENCOUNTER — Encounter: Payer: Self-pay | Admitting: Podiatry

## 2024-09-06 ENCOUNTER — Other Ambulatory Visit: Payer: Self-pay

## 2024-09-06 ENCOUNTER — Inpatient Hospital Stay (HOSPITAL_COMMUNITY)
Admission: EM | Admit: 2024-09-06 | Discharge: 2024-09-13 | DRG: 853 | Disposition: A | Discharge status: Home Health | Source: Ambulatory Visit | Attending: Internal Medicine | Admitting: Internal Medicine

## 2024-09-06 ENCOUNTER — Ambulatory Visit (INDEPENDENT_AMBULATORY_CARE_PROVIDER_SITE_OTHER): Admitting: Podiatry

## 2024-09-06 ENCOUNTER — Inpatient Hospital Stay (HOSPITAL_COMMUNITY)

## 2024-09-06 ENCOUNTER — Encounter (HOSPITAL_COMMUNITY): Payer: Self-pay

## 2024-09-06 ENCOUNTER — Ambulatory Visit (INDEPENDENT_AMBULATORY_CARE_PROVIDER_SITE_OTHER)

## 2024-09-06 VITALS — BP 171/75 | HR 84 | Temp 101.9°F

## 2024-09-06 DIAGNOSIS — N2581 Secondary hyperparathyroidism of renal origin: Secondary | ICD-10-CM | POA: Diagnosis present

## 2024-09-06 DIAGNOSIS — T25321D Burn of third degree of right foot, subsequent encounter: Secondary | ICD-10-CM

## 2024-09-06 DIAGNOSIS — I120 Hypertensive chronic kidney disease with stage 5 chronic kidney disease or end stage renal disease: Secondary | ICD-10-CM | POA: Diagnosis present

## 2024-09-06 DIAGNOSIS — D631 Anemia in chronic kidney disease: Secondary | ICD-10-CM | POA: Diagnosis present

## 2024-09-06 DIAGNOSIS — Z955 Presence of coronary angioplasty implant and graft: Secondary | ICD-10-CM

## 2024-09-06 DIAGNOSIS — I96 Gangrene, not elsewhere classified: Secondary | ICD-10-CM

## 2024-09-06 DIAGNOSIS — E11628 Type 2 diabetes mellitus with other skin complications: Secondary | ICD-10-CM | POA: Diagnosis not present

## 2024-09-06 DIAGNOSIS — I70263 Atherosclerosis of native arteries of extremities with gangrene, bilateral legs: Secondary | ICD-10-CM | POA: Diagnosis present

## 2024-09-06 DIAGNOSIS — M14679 Charcot's joint, unspecified ankle and foot: Secondary | ICD-10-CM

## 2024-09-06 DIAGNOSIS — M86171 Other acute osteomyelitis, right ankle and foot: Secondary | ICD-10-CM | POA: Diagnosis present

## 2024-09-06 DIAGNOSIS — M869 Osteomyelitis, unspecified: Secondary | ICD-10-CM | POA: Diagnosis not present

## 2024-09-06 DIAGNOSIS — I70292 Other atherosclerosis of native arteries of extremities, left leg: Secondary | ICD-10-CM | POA: Diagnosis not present

## 2024-09-06 DIAGNOSIS — L97819 Non-pressure chronic ulcer of other part of right lower leg with unspecified severity: Secondary | ICD-10-CM | POA: Diagnosis present

## 2024-09-06 DIAGNOSIS — L03116 Cellulitis of left lower limb: Secondary | ICD-10-CM | POA: Diagnosis present

## 2024-09-06 DIAGNOSIS — I70291 Other atherosclerosis of native arteries of extremities, right leg: Secondary | ICD-10-CM | POA: Diagnosis not present

## 2024-09-06 DIAGNOSIS — L97519 Non-pressure chronic ulcer of other part of right foot with unspecified severity: Secondary | ICD-10-CM | POA: Diagnosis not present

## 2024-09-06 DIAGNOSIS — Z1152 Encounter for screening for COVID-19: Secondary | ICD-10-CM

## 2024-09-06 DIAGNOSIS — I70293 Other atherosclerosis of native arteries of extremities, bilateral legs: Secondary | ICD-10-CM | POA: Diagnosis not present

## 2024-09-06 DIAGNOSIS — Z992 Dependence on renal dialysis: Secondary | ICD-10-CM

## 2024-09-06 DIAGNOSIS — E1142 Type 2 diabetes mellitus with diabetic polyneuropathy: Secondary | ICD-10-CM | POA: Diagnosis present

## 2024-09-06 DIAGNOSIS — N186 End stage renal disease: Secondary | ICD-10-CM | POA: Diagnosis present

## 2024-09-06 DIAGNOSIS — E1140 Type 2 diabetes mellitus with diabetic neuropathy, unspecified: Secondary | ICD-10-CM | POA: Diagnosis not present

## 2024-09-06 DIAGNOSIS — Z833 Family history of diabetes mellitus: Secondary | ICD-10-CM

## 2024-09-06 DIAGNOSIS — I2510 Atherosclerotic heart disease of native coronary artery without angina pectoris: Secondary | ICD-10-CM | POA: Diagnosis present

## 2024-09-06 DIAGNOSIS — Z825 Family history of asthma and other chronic lower respiratory diseases: Secondary | ICD-10-CM

## 2024-09-06 DIAGNOSIS — E1152 Type 2 diabetes mellitus with diabetic peripheral angiopathy with gangrene: Secondary | ICD-10-CM

## 2024-09-06 DIAGNOSIS — Z89431 Acquired absence of right foot: Secondary | ICD-10-CM | POA: Diagnosis not present

## 2024-09-06 DIAGNOSIS — D72829 Elevated white blood cell count, unspecified: Secondary | ICD-10-CM | POA: Diagnosis present

## 2024-09-06 DIAGNOSIS — E66811 Obesity, class 1: Secondary | ICD-10-CM | POA: Diagnosis present

## 2024-09-06 DIAGNOSIS — L089 Local infection of the skin and subcutaneous tissue, unspecified: Secondary | ICD-10-CM | POA: Diagnosis not present

## 2024-09-06 DIAGNOSIS — R509 Fever, unspecified: Secondary | ICD-10-CM | POA: Diagnosis present

## 2024-09-06 DIAGNOSIS — E11319 Type 2 diabetes mellitus with unspecified diabetic retinopathy without macular edema: Secondary | ICD-10-CM | POA: Diagnosis present

## 2024-09-06 DIAGNOSIS — Z7982 Long term (current) use of aspirin: Secondary | ICD-10-CM

## 2024-09-06 DIAGNOSIS — Z87891 Personal history of nicotine dependence: Secondary | ICD-10-CM

## 2024-09-06 DIAGNOSIS — Z888 Allergy status to other drugs, medicaments and biological substances status: Secondary | ICD-10-CM

## 2024-09-06 DIAGNOSIS — L97829 Non-pressure chronic ulcer of other part of left lower leg with unspecified severity: Secondary | ICD-10-CM | POA: Diagnosis present

## 2024-09-06 DIAGNOSIS — Z794 Long term (current) use of insulin: Secondary | ICD-10-CM

## 2024-09-06 DIAGNOSIS — Z635 Disruption of family by separation and divorce: Secondary | ICD-10-CM

## 2024-09-06 DIAGNOSIS — K219 Gastro-esophageal reflux disease without esophagitis: Secondary | ICD-10-CM | POA: Diagnosis present

## 2024-09-06 DIAGNOSIS — I70262 Atherosclerosis of native arteries of extremities with gangrene, left leg: Secondary | ICD-10-CM | POA: Diagnosis not present

## 2024-09-06 DIAGNOSIS — Z800 Family history of malignant neoplasm of digestive organs: Secondary | ICD-10-CM

## 2024-09-06 DIAGNOSIS — E119 Type 2 diabetes mellitus without complications: Secondary | ICD-10-CM

## 2024-09-06 DIAGNOSIS — L97529 Non-pressure chronic ulcer of other part of left foot with unspecified severity: Secondary | ICD-10-CM | POA: Diagnosis present

## 2024-09-06 DIAGNOSIS — M797 Fibromyalgia: Secondary | ICD-10-CM | POA: Diagnosis present

## 2024-09-06 DIAGNOSIS — Z8249 Family history of ischemic heart disease and other diseases of the circulatory system: Secondary | ICD-10-CM

## 2024-09-06 DIAGNOSIS — R6520 Severe sepsis without septic shock: Secondary | ICD-10-CM | POA: Diagnosis present

## 2024-09-06 DIAGNOSIS — E11621 Type 2 diabetes mellitus with foot ulcer: Secondary | ICD-10-CM | POA: Diagnosis present

## 2024-09-06 DIAGNOSIS — Z6833 Body mass index (BMI) 33.0-33.9, adult: Secondary | ICD-10-CM | POA: Diagnosis not present

## 2024-09-06 DIAGNOSIS — A419 Sepsis, unspecified organism: Principal | ICD-10-CM | POA: Diagnosis present

## 2024-09-06 DIAGNOSIS — M86172 Other acute osteomyelitis, left ankle and foot: Secondary | ICD-10-CM | POA: Diagnosis present

## 2024-09-06 DIAGNOSIS — E1169 Type 2 diabetes mellitus with other specified complication: Secondary | ICD-10-CM | POA: Diagnosis present

## 2024-09-06 DIAGNOSIS — I70222 Atherosclerosis of native arteries of extremities with rest pain, left leg: Secondary | ICD-10-CM | POA: Diagnosis not present

## 2024-09-06 DIAGNOSIS — I252 Old myocardial infarction: Secondary | ICD-10-CM

## 2024-09-06 DIAGNOSIS — E1122 Type 2 diabetes mellitus with diabetic chronic kidney disease: Secondary | ICD-10-CM | POA: Diagnosis present

## 2024-09-06 DIAGNOSIS — E1149 Type 2 diabetes mellitus with other diabetic neurological complication: Secondary | ICD-10-CM

## 2024-09-06 DIAGNOSIS — Z8051 Family history of malignant neoplasm of kidney: Secondary | ICD-10-CM

## 2024-09-06 DIAGNOSIS — Z95820 Peripheral vascular angioplasty status with implants and grafts: Secondary | ICD-10-CM | POA: Diagnosis not present

## 2024-09-06 DIAGNOSIS — I70223 Atherosclerosis of native arteries of extremities with rest pain, bilateral legs: Secondary | ICD-10-CM | POA: Diagnosis not present

## 2024-09-06 DIAGNOSIS — Z9641 Presence of insulin pump (external) (internal): Secondary | ICD-10-CM | POA: Diagnosis present

## 2024-09-06 DIAGNOSIS — Z7902 Long term (current) use of antithrombotics/antiplatelets: Secondary | ICD-10-CM

## 2024-09-06 DIAGNOSIS — Z89421 Acquired absence of other right toe(s): Secondary | ICD-10-CM | POA: Diagnosis not present

## 2024-09-06 DIAGNOSIS — Z90710 Acquired absence of both cervix and uterus: Secondary | ICD-10-CM

## 2024-09-06 DIAGNOSIS — I7090 Unspecified atherosclerosis: Secondary | ICD-10-CM | POA: Diagnosis not present

## 2024-09-06 DIAGNOSIS — Z79899 Other long term (current) drug therapy: Secondary | ICD-10-CM

## 2024-09-06 DIAGNOSIS — E785 Hyperlipidemia, unspecified: Secondary | ICD-10-CM | POA: Diagnosis present

## 2024-09-06 DIAGNOSIS — Z803 Family history of malignant neoplasm of breast: Secondary | ICD-10-CM

## 2024-09-06 DIAGNOSIS — H409 Unspecified glaucoma: Secondary | ICD-10-CM | POA: Diagnosis present

## 2024-09-06 DIAGNOSIS — M7989 Other specified soft tissue disorders: Secondary | ICD-10-CM | POA: Diagnosis not present

## 2024-09-06 DIAGNOSIS — S91301A Unspecified open wound, right foot, initial encounter: Secondary | ICD-10-CM | POA: Diagnosis not present

## 2024-09-06 DIAGNOSIS — Z7985 Long-term (current) use of injectable non-insulin antidiabetic drugs: Secondary | ICD-10-CM

## 2024-09-06 DIAGNOSIS — S91302A Unspecified open wound, left foot, initial encounter: Secondary | ICD-10-CM | POA: Diagnosis not present

## 2024-09-06 LAB — CBC WITH DIFFERENTIAL/PLATELET
Abs Immature Granulocytes: 0.07 K/uL (ref 0.00–0.07)
Basophils Absolute: 0.1 K/uL (ref 0.0–0.1)
Basophils Relative: 1 %
Eosinophils Absolute: 0.2 K/uL (ref 0.0–0.5)
Eosinophils Relative: 2 %
HCT: 33.5 % — ABNORMAL LOW (ref 36.0–46.0)
Hemoglobin: 10.3 g/dL — ABNORMAL LOW (ref 12.0–15.0)
Immature Granulocytes: 1 %
Lymphocytes Relative: 16 %
Lymphs Abs: 2.2 K/uL (ref 0.7–4.0)
MCH: 29.9 pg (ref 26.0–34.0)
MCHC: 30.7 g/dL (ref 30.0–36.0)
MCV: 97.4 fL (ref 80.0–100.0)
Monocytes Absolute: 1.1 K/uL — ABNORMAL HIGH (ref 0.1–1.0)
Monocytes Relative: 8 %
Neutro Abs: 10.1 K/uL — ABNORMAL HIGH (ref 1.7–7.7)
Neutrophils Relative %: 72 %
Platelets: 392 K/uL (ref 150–400)
RBC: 3.44 MIL/uL — ABNORMAL LOW (ref 3.87–5.11)
RDW: 14.1 % (ref 11.5–15.5)
WBC: 13.8 K/uL — ABNORMAL HIGH (ref 4.0–10.5)
nRBC: 0 % (ref 0.0–0.2)

## 2024-09-06 LAB — HEMOGLOBIN A1C
Hgb A1c MFr Bld: 6.5 % — ABNORMAL HIGH (ref 4.8–5.6)
Mean Plasma Glucose: 139.85 mg/dL

## 2024-09-06 LAB — I-STAT CG4 LACTIC ACID, ED
Lactic Acid, Venous: 1.3 mmol/L (ref 0.5–1.9)
Lactic Acid, Venous: 2 mmol/L (ref 0.5–1.9)
Lactic Acid, Venous: 0.8 mmol/L (ref 0.5–1.9)

## 2024-09-06 LAB — MAGNESIUM: Magnesium: 2.2 mg/dL (ref 1.7–2.4)

## 2024-09-06 LAB — COMPREHENSIVE METABOLIC PANEL WITH GFR
ALT: 14 U/L (ref 0–44)
AST: 30 U/L (ref 15–41)
Albumin: 3.1 g/dL — ABNORMAL LOW (ref 3.5–5.0)
Alkaline Phosphatase: 266 U/L — ABNORMAL HIGH (ref 38–126)
Anion gap: 15 (ref 5–15)
BUN: 30 mg/dL — ABNORMAL HIGH (ref 6–20)
CO2: 26 mmol/L (ref 22–32)
Calcium: 8.8 mg/dL — ABNORMAL LOW (ref 8.9–10.3)
Chloride: 96 mmol/L — ABNORMAL LOW (ref 98–111)
Creatinine, Ser: 5.96 mg/dL — ABNORMAL HIGH (ref 0.44–1.00)
GFR, Estimated: 8 mL/min — ABNORMAL LOW (ref >60)
Glucose, Bld: 123 mg/dL — ABNORMAL HIGH (ref 70–99)
Potassium: 4.7 mmol/L (ref 3.5–5.1)
Sodium: 137 mmol/L (ref 135–145)
Total Bilirubin: 0.6 mg/dL (ref 0.0–1.2)
Total Protein: 7.8 g/dL (ref 6.5–8.1)

## 2024-09-06 LAB — PROTIME-INR
INR: 1.1 (ref 0.8–1.2)
Prothrombin Time: 14.7 s (ref 11.4–15.2)

## 2024-09-06 LAB — CBG MONITORING, ED: Glucose-Capillary: 151 mg/dL — ABNORMAL HIGH (ref 70–99)

## 2024-09-06 LAB — RESP PANEL BY RT-PCR (RSV, FLU A&B, COVID)  RVPGX2
Influenza A by PCR: NEGATIVE
Influenza B by PCR: NEGATIVE
Resp Syncytial Virus by PCR: NEGATIVE
SARS Coronavirus 2 by RT PCR: NEGATIVE

## 2024-09-06 MED ORDER — PIPERACILLIN-TAZOBACTAM IN DEX 2-0.25 GM/50ML IV SOLN
2.2500 g | Freq: Three times a day (TID) | INTRAVENOUS | Status: DC
  Administered 2024-09-06 – 2024-09-13 (×17): 2.25 g via INTRAVENOUS
  Filled 2024-09-06 (×25): qty 50

## 2024-09-06 MED ORDER — INSULIN ASPART 100 UNIT/ML IJ SOLN
0.0000 [IU] | Freq: Four times a day (QID) | INTRAMUSCULAR | Status: DC
  Administered 2024-09-06: 1 [IU] via SUBCUTANEOUS
  Administered 2024-09-07: 3 [IU] via SUBCUTANEOUS
  Administered 2024-09-07: 1 [IU] via SUBCUTANEOUS
  Administered 2024-09-08: 4 [IU] via SUBCUTANEOUS
  Administered 2024-09-08: 2 [IU] via SUBCUTANEOUS
  Administered 2024-09-08 – 2024-09-09 (×2): 3 [IU] via SUBCUTANEOUS
  Administered 2024-09-09: 4 [IU] via SUBCUTANEOUS

## 2024-09-06 MED ORDER — LINEZOLID 600 MG/300ML IV SOLN
600.0000 mg | Freq: Once | INTRAVENOUS | Status: AC
  Administered 2024-09-06: 600 mg via INTRAVENOUS
  Filled 2024-09-06: qty 300

## 2024-09-06 MED ORDER — VANCOMYCIN HCL 2000 MG/400ML IV SOLN
2000.0000 mg | Freq: Once | INTRAVENOUS | Status: AC
  Administered 2024-09-06: 2000 mg via INTRAVENOUS
  Filled 2024-09-06: qty 400

## 2024-09-06 MED ORDER — ONDANSETRON HCL 4 MG/2ML IJ SOLN
4.0000 mg | Freq: Four times a day (QID) | INTRAMUSCULAR | Status: DC | PRN
  Administered 2024-09-10 (×2): 4 mg via INTRAVENOUS
  Filled 2024-09-06: qty 2

## 2024-09-06 MED ORDER — ACETAMINOPHEN 325 MG PO TABS
650.0000 mg | ORAL_TABLET | Freq: Four times a day (QID) | ORAL | Status: DC | PRN
  Administered 2024-09-09: 650 mg via ORAL
  Filled 2024-09-06 (×4): qty 2

## 2024-09-06 MED ORDER — LACTATED RINGERS IV SOLN: INTRAVENOUS | Status: DC

## 2024-09-06 MED ORDER — HYDROMORPHONE HCL 1 MG/ML IJ SOLN
0.5000 mg | Freq: Once | INTRAMUSCULAR | Status: AC
  Administered 2024-09-06: 0.5 mg via INTRAVENOUS
  Filled 2024-09-06: qty 1

## 2024-09-06 MED ORDER — MELATONIN 3 MG PO TABS: 3.0000 mg | ORAL_TABLET | Freq: Every evening | ORAL | Status: DC | PRN

## 2024-09-06 MED ORDER — NALOXONE HCL 0.4 MG/ML IJ SOLN: 0.4000 mg | INTRAMUSCULAR | Status: DC | PRN

## 2024-09-06 MED ORDER — SODIUM CHLORIDE 0.9 % IV SOLN
3.0000 g | Freq: Once | INTRAVENOUS | Status: AC
  Administered 2024-09-06: 3 g via INTRAVENOUS
  Filled 2024-09-06: qty 8

## 2024-09-06 MED ORDER — FENTANYL CITRATE PF 50 MCG/ML IJ SOSY
25.0000 ug | PREFILLED_SYRINGE | INTRAMUSCULAR | Status: DC | PRN
  Administered 2024-09-07 – 2024-09-08 (×5): 25 ug via INTRAVENOUS
  Filled 2024-09-06 (×5): qty 1

## 2024-09-06 MED ORDER — ACETAMINOPHEN 325 MG PO TABS
650.0000 mg | ORAL_TABLET | Freq: Once | ORAL | Status: AC | PRN
  Administered 2024-09-06: 650 mg via ORAL
  Filled 2024-09-06: qty 2

## 2024-09-06 MED ORDER — VANCOMYCIN HCL IN DEXTROSE 1-5 GM/200ML-% IV SOLN
1000.0000 mg | INTRAVENOUS | Status: DC
  Administered 2024-09-10: 1000 mg via INTRAVENOUS
  Filled 2024-09-06 (×2): qty 200

## 2024-09-06 MED ORDER — DEXTROSE 50 % IV SOLN: 1.0000 | INTRAVENOUS | Status: DC | PRN

## 2024-09-06 MED ORDER — ACETAMINOPHEN 650 MG RE SUPP: 650.0000 mg | Freq: Four times a day (QID) | RECTAL | Status: DC | PRN
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
## 2024-09-06 NOTE — Telephone Encounter (Signed)
 10/6 - LVM for patient to follow up on NO SHOWED appts CT and Mody.  Patient informed to contact [facility] for follow up.    Please schedule for next available appt CT and Mody.    Thanks, [place]
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
## 2024-09-06 NOTE — ED Notes (Signed)
 UA cancelled b/c pt does not make urine.

## 2024-09-06 NOTE — H&P (Signed)
 History and Physical      [name] FMW:[id number] DOB: [birth year] DOA: 09/06/2024; DOS: 09/06/2024  PCP: Karen [name] *** Patient coming from: home ***  I have personally briefly reviewed patient's old medical records in [facility] Link  Chief Complaint: ***  HPI: Karen [name] is a 48 y.o. female with medical history significant for *** who is admitted to [facility] on 09/06/2024 with *** after presenting from home*** to [facility] ED complaining of ***.    ***       ***   ED Course:  Vital signs in the ED were notable for the following: ***  Labs were notable for the following: ***  Per my interpretation, EKG in ED demonstrated the following:  ***  Imaging in the ED, per corresponding formal radiology read, was notable for the following:  ***  I have discussed with on-call podiatry, Dr. Malvin, who will formally consult and see pt in the AM. Dr. Malvin recommends broad spectrum iv abx, bilateral vascular US  abi w/ or w/o TBI, and MRI of left foot, and has ordered these vascular/mri studies.   While in the ED, the following were administered: ***  Subsequently, the patient was admitted  ***  ***red    Review of Systems: As per HPI otherwise 10 point review of systems negative.   Past Medical History:  Diagnosis Date   ADHD (attention deficit hyperactivity disorder)    Anemia    Arthritis    Asthma    mild = rarely uses inhaler   Blindness of left eye    retinopathy   Diabetes mellitus without complication (HCC)    type 2   Dyspnea    with exertion   Fibromyalgia    GERD (gastroesophageal reflux disease)    no meds   Hypertension    Lung nodule    1.0 cm LLL lung nodule 07/16/22 CT (evaluation by [facility] Dr. Allyne Render)   Osteomyelitis [facility])    RIGHT FOOT FIFTH TOE   Ovarian mass, left 06/26/2021   Renal disorder    ESRD   Sleep apnea    does not use cpap    Past Surgical History:  Procedure Laterality  Date   ABDOMINAL AORTOGRAM W/LOWER EXTREMITY N/A 06/08/2018   Procedure: ABDOMINAL AORTOGRAM W/LOWER EXTREMITY;  Surgeon: Sheree Penne Bruckner, MD;  Location: [facility] INVASIVE CV LAB;  Service: Cardiovascular;  Laterality: N/A;   ABDOMINAL HYSTERECTOMY  09/2016   AMPUTATION Right 06/11/2018   Procedure: AMPUTATION RIGHT FIFTH TOE;  Surgeon: Eliza Bruckner RAMAN, MD;  Location: [facility] OR;  Service: Vascular;  Laterality: Right;   APPENDECTOMY     AV FISTULA PLACEMENT Left    CHOLECYSTECTOMY     COLON SURGERY     COLONOSCOPY  2023   CORONARY ATHERECTOMY N/A 01/27/2024   Procedure: CORONARY ATHERECTOMY;  Surgeon: Verlin Bruckner BIRCH, MD;  Location: [facility] INVASIVE CV LAB;  Service: Cardiovascular;  Laterality: N/A;   CORONARY STENT INTERVENTION N/A 01/27/2024   Procedure: CORONARY STENT INTERVENTION;  Surgeon: Verlin Bruckner BIRCH, MD;  Location: [facility] INVASIVE CV LAB;  Service: Cardiovascular;  Laterality: N/A;   cysts removed from top of head  1978   age 10yr   EYE SURGERY Bilateral    retinal - left eye blind   EYE SURGERY Right    cataract removed with lens placement   INSERTION OF DIALYSIS CATHETER N/A 08/06/2022   Procedure: INSERTION OF RIGHT INTERNAL JUGULAR TUNNELED DIALYSIS CATHETER;  Surgeon: Eliza Bruckner RAMAN, MD;  Location: [facility] OR;  Service: Vascular;  Laterality: N/A;   LEFT HEART CATH AND CORONARY ANGIOGRAPHY N/A 01/27/2024   Procedure: LEFT HEART CATH AND CORONARY ANGIOGRAPHY;  Surgeon: Verlin Lonni BIRCH, MD;  Location: [facility] INVASIVE CV LAB;  Service: Cardiovascular;  Laterality: N/A;   reimplantation of uterer     at age 8 yrs   REVISON OF ARTERIOVENOUS FISTULA Left 08/06/2022   Procedure: REVISON OF LEFT ARTERIOVENOUS FISTULA WITH REPAIR OF PSEUDOANEURYSMS AND SUPERFISTULIZATIONS;  Surgeon: Eliza Lonni RAMAN, MD;  Location: [facility] OR;  Service: Vascular;  Laterality: Left;   TRIGGER FINGER RELEASE     right hand ring finger   TUBAL LIGATION     UPPER GI ENDOSCOPY  2017    WISDOM TOOTH EXTRACTION      Social History:  reports that she quit smoking about 4 years ago. Her smoking use included cigarettes. She started smoking about 29 years ago. She has a 25 pack-year smoking history. She has never been exposed to tobacco smoke. She has never used smokeless tobacco. She reports that she does not currently use alcohol . She reports current drug use. Drug: Marijuana.   Allergies  Allergen Reactions   Furosemide Swelling   Heparin  Other (See Comments)    Patient relates vitreous hemorrhage after heparin . Patient relates vitreous hemorrhage after heparin .    Family History  Problem Relation Age of Onset   Breast cancer Mother    Diabetes Father    Breast cancer Maternal Grandmother    Stomach cancer Maternal Grandmother    Diabetes Paternal Grandmother    Breast cancer Maternal Aunt    COPD Maternal Aunt    COPD Maternal Aunt    Heart failure Maternal Aunt    COPD Maternal Aunt    Kidney cancer Maternal Aunt     Family history reviewed and not pertinent ***   Prior to Admission medications   Medication Sig Start Date End Date Taking? Authorizing Provider  albuterol  (VENTOLIN  HFA) 108 (90 Base) MCG/ACT inhaler Inhale 1-2 puffs into the lungs every 6 (six) hours as needed for wheezing or shortness of breath.   Yes [provider]  aspirin  EC 81 MG tablet Take 1 tablet (81 mg total) by mouth daily. Swallow whole. 02/26/24  Yes Wyn Jackee VEAR Mickey., NP  B Complex-C-Folic Acid  (RENA-VITE PO) Take 1 tablet by mouth in the morning.   Yes [provider]  brimonidine (ALPHAGAN) 0.2 % ophthalmic solution Place 1 drop into both eyes in the morning and at bedtime. 01/29/24  Yes [provider]  cinacalcet  (SENSIPAR ) 30 MG tablet Take 90 mg by mouth daily with breakfast. After hemodialysis   Yes [provider]  clopidogrel  (PLAVIX ) 75 MG tablet Take 1 tablet (75 mg total) by mouth daily. 03/01/24  Yes Turner, Wilbert SAUNDERS, MD   collagenase  (SANTYL ) 250 UNIT/GM ointment Apply 1 Application topically daily. Wound dimension (3 cmx 2.5 cm x 0.2 cm, 1 cm x 3.5 cm x 0.2 cm) Dispense QTY sufficient per manufactures dosing calculator. 08/23/24  Yes Sikora, Rebecca, DPM  ethyl chloride spray Apply 1 Application topically as needed (prior to dialysis). 09/29/18  Yes [provider]  FOSRENOL  1000 MG PACK Take 1,000-2,000 mg by mouth See admin instructions. Take 2 packets (2000 mg) by mouth with each meal & take 1 packet (1000 mg) by mouth with each snack 01/12/19  Yes [provider]  gabapentin  (NEURONTIN ) 100 MG capsule Take 100-200 mg by mouth See admin instructions. Take 2 capsules (200  mg) by mouth every morning & take 1 capsule (100 mg) by mouth at bedtime. 03/12/21  Yes [provider]  glucagon 1 MG injection Inject 1 mg into the muscle once as needed (low blood sugar). 06/16/18  Yes [provider]  insulin  aspart (NOVOLOG ) 100 UNIT/ML injection Inject 0-10 Units into the skin See admin instructions. Via insulin  pump   Yes [provider]  lidocaine  (LIDODERM ) 5 % Place 1 patch onto the skin daily. 11/27/23  Yes [provider]  metoprolol  tartrate (LOPRESSOR ) 25 MG tablet Take (12.5mg ) half tablet twice a day except on dialysis days Patient taking differently: Take 12.5 mg by mouth See admin instructions. Take (12.5mg ) half tablet twice a day except on dialysis days. Take 12.5mg  at night on dialysis. 07/21/24  Yes Wyn Jackee VEAR Mickey., NP  ammonium lactate  (AMLACTIN) 12 % cream Apply 1 Application topically as needed for dry skin. 12/11/23   Sikora, Rebecca, DPM  ketoconazole (NIZORAL) 2 % shampoo Apply 1 Application topically once a week. Patient not taking: Reported on 09/06/2024 09/03/21   [provider]  OZEMPIC, 0.25 OR 0.5 MG/DOSE, 2 MG/3ML SOPN Inject 2 mg into the skin once a week. Patient not taking: Reported on 09/06/2024 01/14/24   [provider]      Objective    Physical Exam: Vitals:   09/06/24 1629 09/06/24 1630 09/06/24 1751 09/06/24 1903  BP: 116/65   (!) 171/76  Pulse: 84   92  Resp: 18   16  Temp: (!) 100.8 F (38.2 C)  (!) 101.3 F (38.5 C) (!) 102 F (38.9 C)  TempSrc: Oral  Oral Oral  SpO2: 95%   100%  Weight:  90 kg    Height:  5' 5 (1.651 m)      General: appears to be stated age; alert, oriented Skin: warm, dry, no rash Head:  AT/[place] Mouth:  Oral mucosa membranes appear moist, normal dentition Neck: supple; trachea midline Heart:  RRR; did not appreciate any M/R/G Lungs: CTAB, did not appreciate any wheezes, rales, or rhonchi Abdomen: + BS; soft, ND, NT Vascular: 2+ pedal pulses b/l; 2+ radial pulses b/l Extremities: no peripheral edema, no muscle wasting Neuro: strength and sensation intact in upper and lower extremities b/l ***   *** Neuro: 5/5 strength of the proximal and distal flexors and extensors of the upper and lower extremities bilaterally; sensation intact in upper and lower extremities b/l; cranial nerves II through XII grossly intact; no pronator drift; no evidence suggestive of slurred speech, dysarthria, or facial droop; Normal muscle tone. No tremors.  *** Neuro: In the setting of the patient's current mental status and associated inability to follow instructions, unable to perform full neurologic exam at this time.  As such, assessment of strength, sensation, and cranial nerves is limited at this time. Patient noted to spontaneously move all 4 extremities. No tremors.  ***    Labs on Admission: I have personally reviewed following labs and imaging studies  CBC: Recent Labs  Lab 09/06/24 1750  WBC 13.8*  NEUTROABS 10.1*  HGB 10.3*  HCT 33.5*  MCV 97.4  PLT 392   Basic Metabolic Panel: Recent Labs  Lab 09/06/24 1750  NA 137  K 4.7  CL 96*  CO2 26  GLUCOSE 123*  BUN 30*  CREATININE 5.96*  CALCIUM  8.8*   GFR: Estimated Creatinine Clearance: 12.8 mL/min (A) (by  C-G formula based on SCr of 5.96 mg/dL (H)). Liver Function Tests: Recent Labs  Lab  09/06/24 1750  AST 30  ALT 14  ALKPHOS 266*  BILITOT 0.6  PROT 7.8  ALBUMIN  3.1*   No results for input(s): LIPASE, AMYLASE in the last 168 hours. No results for input(s): AMMONIA in the last 168 hours. Coagulation Profile: Recent Labs  Lab 09/06/24 1750  INR 1.1   Cardiac Enzymes: No results for input(s): CKTOTAL, CKMB, CKMBINDEX, TROPONINI in the last 168 hours. BNP (last 3 results) No results for input(s): PROBNP in the last 8760 hours. HbA1C: No results for input(s): HGBA1C in the last 72 hours. CBG: No results for input(s): GLUCAP in the last 168 hours. Lipid Profile: No results for input(s): CHOL, HDL, LDLCALC, TRIG, CHOLHDL, LDLDIRECT in the last 72 hours. Thyroid Function Tests: No results for input(s): TSH, T4TOTAL, FREET4, T3FREE, THYROIDAB in the last 72 hours. Anemia Panel: No results for input(s): VITAMINB12, FOLATE, FERRITIN, TIBC, IRON, RETICCTPCT in the last 72 hours. Urine analysis:    Component Value Date/Time   COLORURINE YELLOW 12/10/2021 2342   APPEARANCEUR TURBID (A) 12/10/2021 2342   LABSPEC 1.015 12/10/2021 2342   PHURINE 7.0 12/10/2021 2342   GLUCOSEU >=500 (A) 12/10/2021 2342   HGBUR MODERATE (A) 12/10/2021 2342   BILIRUBINUR NEGATIVE 12/10/2021 2342   KETONESUR NEGATIVE 12/10/2021 2342   PROTEINUR >300 (A) 12/10/2021 2342   NITRITE NEGATIVE 12/10/2021 2342   LEUKOCYTESUR MODERATE (A) 12/10/2021 2342    Radiological Exams on Admission: DG Foot Complete Left Result Date: 09/06/2024 Please see detailed radiograph report in office note.  DG Foot Complete Right Result Date: 09/06/2024 Please see detailed radiograph report in office note.     Assessment/Plan   Principal Problem:   Cellulitis of left lower  extremity   ***            ***                  ***                   ***                  ***                  ***                  ***                   ***                  ***                  ***                  ***                  ***                 ***                ***  DVT prophylaxis: SCD's ***  Code Status: Full code*** Family Communication: none*** Disposition Plan: Per Rounding Team Consults called: I have discussed with on-call podiatry, Dr. Malvin, who will formally consult and see pt in the AM. Dr. Malvin recommends broad spectrum iv abx, bilateral vascular US  abi w/ or w/o TBI, and MRI of left foot, and has ordered these vascular/mri studies. ;  Admission status: ***     I SPENT GREATER THAN 75 ***  MINUTES IN CLINICAL CARE TIME/MEDICAL DECISION-MAKING IN COMPLETING THIS ADMISSION.      Eva NOVAK Fred Hammes DO [facility]  From 7PM - 7AM   09/06/2024, 8:41 PM   ***

## 2024-09-06 NOTE — ED Notes (Signed)
 Attempted stick for 2nd IV, unsuccessful. Pt refusing to be stuck again stating, I am a very hard stick and I am really tired of being stuck all the time'. MD notified that 2nd set of cultures were unsuccessful. MD okay with starting ABX w/ the 2nd set of cultures.

## 2024-09-06 NOTE — Progress Notes (Signed)
 Pharmacy Antibiotic Note  Karen [name] is a 48 y.o. female admitted on 09/06/2024 foot infection, concern for osteo.  Pharmacy has been consulted for vancomycin  dosing.  ESRD-HD usually MWF  Plan: Vancomycin  2g IV x 1, then 1g IV q HD Monitor HD schedule, Cx and surgical plans Vancomycin  random level as needed  Height: 5' 5 (165.1 cm) Weight: 90 kg (198 lb 6.6 oz) IBW/kg (Calculated) : 57  Temp (24hrs), Avg:101.5 F (38.6 C), Min:100.8 F (38.2 C), Max:102 F (38.9 C)  Recent Labs  Lab 09/06/24 1750 09/06/24 1757 09/06/24 1941  WBC 13.8*  --   --   CREATININE 5.96*  --   --   LATICACIDVEN  --  1.3 2.0*    Estimated Creatinine Clearance: 12.8 mL/min (A) (by C-G formula based on SCr of 5.96 mg/dL (H)).    Allergies  Allergen Reactions   Furosemide Swelling   Heparin  Other (See Comments)    Patient relates vitreous hemorrhage after heparin . Patient relates vitreous hemorrhage after heparin .    Dorn Poot, PharmD, [facility] Clinical Pharmacist ED Pharmacist Phone # [phone] 09/06/2024 9:52 PM
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
## 2024-09-06 NOTE — ED Provider Notes (Signed)
 [place] EMERGENCY DEPARTMENT AT [facility] Provider Note   CSN: [id number] Arrival date & time: 09/06/24  1621     Patient presents with: Wound Check   Karen [name] is a 48 y.o. female.    Wound Check  Patient sent in for admission from podiatry.  Has fever.  Has a left foot infection.  Sent in for IV antibiotics MRI and likely amputation.    Past Medical History:  Diagnosis Date   ADHD (attention deficit hyperactivity disorder)    Anemia    Arthritis    Asthma    mild = rarely uses inhaler   Blindness of left eye    retinopathy   Diabetes mellitus without complication (HCC)    type 2   Dyspnea    with exertion   Fibromyalgia    GERD (gastroesophageal reflux disease)    no meds   Hypertension    Lung nodule    1.0 cm LLL lung nodule 07/16/22 CT (evaluation by [facility] Dr. Allyne Render)   Osteomyelitis [facility])    RIGHT FOOT FIFTH TOE   Ovarian mass, left 06/26/2021   Renal disorder    ESRD   Sleep apnea    does not use cpap    Prior to Admission medications   Medication Sig Start Date End Date Taking? Authorizing Provider  albuterol  (VENTOLIN  HFA) 108 (90 Base) MCG/ACT inhaler Inhale 1-2 puffs into the lungs every 6 (six) hours as needed for wheezing or shortness of breath.    [provider]  ammonium lactate  (AMLACTIN) 12 % cream Apply 1 Application topically as needed for dry skin. 12/11/23   Joya Stabs, DPM  aspirin  EC 81 MG tablet Take 1 tablet (81 mg total) by mouth daily. Swallow whole. 02/26/24   Wyn Jackee VEAR Mickey., NP  B Complex-C-Folic Acid  (RENA-VITE PO) Take 1 tablet by mouth in the morning.    [provider]  brimonidine (ALPHAGAN) 0.2 % ophthalmic solution SMARTSIG:1 In Eye(s) Twice Daily 01/29/24   [provider]  cinacalcet  (SENSIPAR ) 30 MG tablet Take 90 mg by mouth daily with breakfast. After hemodialysis    [provider]  clopidogrel  (PLAVIX ) 75 MG tablet Take 1 tablet (75 mg total) by mouth  daily. 03/01/24   Shlomo Wilbert SAUNDERS, MD  collagenase  (SANTYL ) 250 UNIT/GM ointment Apply 1 Application topically daily. Wound dimension (3 cmx 2.5 cm x 0.2 cm, 1 cm x 3.5 cm x 0.2 cm) Dispense QTY sufficient per manufactures dosing calculator. 08/23/24   Sikora, Rebecca, DPM  ethyl chloride spray Apply 1 Application topically as needed (prior to dialysis). 09/29/18   [provider]  FOSRENOL  1000 MG PACK Take 1,000-2,000 mg by mouth See admin instructions. Take 2 packets (2000 mg) by mouth with each meal & take 1 packet (1000 mg) by mouth with each snack 01/12/19   [provider]  gabapentin  (NEURONTIN ) 100 MG capsule Take 100-200 mg by mouth See admin instructions. Take 2 capsules (200 mg) by mouth every morning & take 1 capsule (100 mg) by mouth at bedtime. 03/12/21   [provider]  glucagon 1 MG injection Inject 1 mg into the muscle once as needed (low blood sugar). 06/16/18   [provider]  insulin  aspart (NOVOLOG ) 100 UNIT/ML injection Inject 0-10 Units into the skin See admin instructions. Via insulin  pump    [provider]  ketoconazole (NIZORAL) 2 % shampoo Apply 1 Application topically once a week. 09/03/21   [provider]  lidocaine  (LIDODERM ) 5 % Place 1 patch onto the skin daily. 11/27/23   [provider]  metoprolol  tartrate (LOPRESSOR ) 25 MG tablet Take (12.5mg ) half tablet twice a day except on dialysis days 07/21/24   Dick, Ernest H Jr., NP  OZEMPIC, 0.25 OR 0.5 MG/DOSE, 2 MG/3ML SOPN Inject 2 mg into the skin once a week. Patient not taking: Reported on 09/06/2024 01/14/24   [provider]    Allergies: Furosemide, Heparin , and Insulin  aspart (human analog) (yeast)    Review of Systems  Updated Vital Signs BP (!) 171/76   Pulse 92   Temp (!) 102 F (38.9 C) (Oral)   Resp 16   Ht 5' 5 (1.651 m)   Wt 90 kg   SpO2 100%   BMI 33.02 kg/m   Physical Exam Vitals and nursing note reviewed.   Cardiovascular:     Rate and Rhythm: Normal rate.  Musculoskeletal:     Comments: Right foot in boot and left foot in shoe.  Neurological:     Mental Status: She is alert.        (all labs ordered are listed, but only abnormal results are displayed) Labs Reviewed  COMPREHENSIVE METABOLIC PANEL WITH GFR - Abnormal; Notable for the following components:      Result Value   Chloride 96 (*)    Glucose, Bld 123 (*)    BUN 30 (*)    Creatinine, Ser 5.96 (*)    Calcium  8.8 (*)    Albumin  3.1 (*)    Alkaline Phosphatase 266 (*)    GFR, Estimated 8 (*)    All other components within normal limits  CBC WITH DIFFERENTIAL/PLATELET - Abnormal; Notable for the following components:   WBC 13.8 (*)    RBC 3.44 (*)    Hemoglobin 10.3 (*)    HCT 33.5 (*)    Neutro Abs 10.1 (*)    Monocytes Absolute 1.1 (*)    All other components within normal limits  CULTURE, BLOOD (ROUTINE X 2)  CULTURE, BLOOD (ROUTINE X 2)  RESP PANEL BY RT-PCR (RSV, FLU A&B, COVID)  RVPGX2  PROTIME-INR  I-STAT CG4 LACTIC ACID, ED  I-STAT CG4 LACTIC ACID, ED    EKG: None  Radiology: DG Foot Complete Left Result Date: 09/06/2024 Please see detailed radiograph report in office note.  DG Foot Complete Right Result Date: 09/06/2024 Please see detailed radiograph report in office note.    Procedures   Medications Ordered in the ED  lactated ringers infusion (has no administration in time range)  Ampicillin-Sulbactam (UNASYN) 3 g in sodium chloride  0.9 % 100 mL IVPB (has no administration in time range)    And  linezolid (ZYVOX) IVPB 600 mg (has no administration in time range)  HYDROmorphone  (DILAUDID ) injection 0.5 mg (has no administration in time range)  acetaminophen  (TYLENOL ) tablet 650 mg (650 mg Oral Given 09/06/24 1757)                                    Medical Decision Making Amount and/or Complexity of Data Reviewed Labs: ordered.  Risk OTC drugs.   Patient with left foot infection.   Also potential right foot infection.  Sent in by podiatry for MRI and admission.  IV antibiotics.  Likely needs surgery.  Antibiotics been ordered.  Will discuss with hospitalist for admission.  Reviewed note from podiatry.   Patient is febrile.  Initial lactic  acid reassuring.  Of note patient is also dialysis patient.  Dialyzed earlier today.  She is Monday Wednesday Friday.  Also has peritoneal catheter placed but has not been used yet.   CRITICAL CARE Performed by: [place] Total critical care time: 30 minutes Critical care time was exclusive of separately billable procedures and treating other patients. Critical care was necessary to treat or prevent imminent or life-threatening deterioration. Critical care was time spent personally by me on the following activities: development of treatment plan with patient and/or surrogate as well as nursing, discussions with consultants, evaluation of patient's response to treatment, examination of patient, obtaining history from patient or surrogate, ordering and performing treatments and interventions, ordering and review of laboratory studies, ordering and review of radiographic studies, pulse oximetry and re-evaluation of patient's condition.      Final diagnoses:  Diabetic foot infection (HCC)  End stage renal disease on dialysis [facility])    ED Discharge Orders     None          River Rankin, MD 09/06/24 [phone]

## 2024-09-06 NOTE — ED Notes (Signed)
 This phlebotomist stuck the patient twice to obtain blood culture and lab. Unable to obtain. Tried to see if patient would let me stick once more she refused. RN Sina aware

## 2024-09-07 ENCOUNTER — Other Ambulatory Visit: Payer: Self-pay

## 2024-09-07 ENCOUNTER — Inpatient Hospital Stay (HOSPITAL_COMMUNITY)

## 2024-09-07 ENCOUNTER — Encounter (HOSPITAL_COMMUNITY): Payer: Self-pay | Admitting: Internal Medicine

## 2024-09-07 ENCOUNTER — Encounter (HOSPITAL_COMMUNITY)

## 2024-09-07 DIAGNOSIS — M869 Osteomyelitis, unspecified: Secondary | ICD-10-CM

## 2024-09-07 DIAGNOSIS — M7989 Other specified soft tissue disorders: Secondary | ICD-10-CM | POA: Diagnosis not present

## 2024-09-07 DIAGNOSIS — I70263 Atherosclerosis of native arteries of extremities with gangrene, bilateral legs: Secondary | ICD-10-CM

## 2024-09-07 DIAGNOSIS — L97519 Non-pressure chronic ulcer of other part of right foot with unspecified severity: Secondary | ICD-10-CM

## 2024-09-07 DIAGNOSIS — M86172 Other acute osteomyelitis, left ankle and foot: Secondary | ICD-10-CM | POA: Diagnosis not present

## 2024-09-07 DIAGNOSIS — Z89421 Acquired absence of other right toe(s): Secondary | ICD-10-CM

## 2024-09-07 DIAGNOSIS — I7090 Unspecified atherosclerosis: Secondary | ICD-10-CM | POA: Diagnosis not present

## 2024-09-07 DIAGNOSIS — I96 Gangrene, not elsewhere classified: Secondary | ICD-10-CM

## 2024-09-07 DIAGNOSIS — D72829 Elevated white blood cell count, unspecified: Secondary | ICD-10-CM | POA: Diagnosis present

## 2024-09-07 DIAGNOSIS — A419 Sepsis, unspecified organism: Secondary | ICD-10-CM | POA: Diagnosis not present

## 2024-09-07 DIAGNOSIS — R6520 Severe sepsis without septic shock: Secondary | ICD-10-CM | POA: Diagnosis not present

## 2024-09-07 DIAGNOSIS — R509 Fever, unspecified: Secondary | ICD-10-CM | POA: Diagnosis present

## 2024-09-07 DIAGNOSIS — I70292 Other atherosclerosis of native arteries of extremities, left leg: Secondary | ICD-10-CM | POA: Diagnosis not present

## 2024-09-07 LAB — VAS US ABI WITH/WO TBI

## 2024-09-07 LAB — COMPREHENSIVE METABOLIC PANEL WITH GFR
ALT: 11 U/L (ref 0–44)
AST: 18 U/L (ref 15–41)
Albumin: 2.4 g/dL — ABNORMAL LOW (ref 3.5–5.0)
Alkaline Phosphatase: 205 U/L — ABNORMAL HIGH (ref 38–126)
Anion gap: 14 (ref 5–15)
BUN: 38 mg/dL — ABNORMAL HIGH (ref 6–20)
CO2: 24 mmol/L (ref 22–32)
Calcium: 8.4 mg/dL — ABNORMAL LOW (ref 8.9–10.3)
Chloride: 97 mmol/L — ABNORMAL LOW (ref 98–111)
Creatinine, Ser: 6.87 mg/dL — ABNORMAL HIGH (ref 0.44–1.00)
GFR, Estimated: 7 mL/min — ABNORMAL LOW (ref >60)
Glucose, Bld: 156 mg/dL — ABNORMAL HIGH (ref 70–99)
Potassium: 4.9 mmol/L (ref 3.5–5.1)
Sodium: 135 mmol/L (ref 135–145)
Total Bilirubin: 0.9 mg/dL (ref 0.0–1.2)
Total Protein: 6.3 g/dL — ABNORMAL LOW (ref 6.5–8.1)

## 2024-09-07 LAB — ABO/RH: ABO/RH(D): A POS

## 2024-09-07 LAB — CBC WITH DIFFERENTIAL/PLATELET
Abs Immature Granulocytes: 0.07 K/uL (ref 0.00–0.07)
Basophils Absolute: 0.1 K/uL (ref 0.0–0.1)
Basophils Relative: 1 %
Eosinophils Absolute: 0.3 K/uL (ref 0.0–0.5)
Eosinophils Relative: 2 %
HCT: 28.7 % — ABNORMAL LOW (ref 36.0–46.0)
Hemoglobin: 8.7 g/dL — ABNORMAL LOW (ref 12.0–15.0)
Immature Granulocytes: 1 %
Lymphocytes Relative: 22 %
Lymphs Abs: 2.7 K/uL (ref 0.7–4.0)
MCH: 29.7 pg (ref 26.0–34.0)
MCHC: 30.3 g/dL (ref 30.0–36.0)
MCV: 98 fL (ref 80.0–100.0)
Monocytes Absolute: 1.1 K/uL — ABNORMAL HIGH (ref 0.1–1.0)
Monocytes Relative: 9 %
Neutro Abs: 8.4 K/uL — ABNORMAL HIGH (ref 1.7–7.7)
Neutrophils Relative %: 65 %
Platelets: 325 K/uL (ref 150–400)
RBC: 2.93 MIL/uL — ABNORMAL LOW (ref 3.87–5.11)
RDW: 14.1 % (ref 11.5–15.5)
WBC: 12.6 K/uL — ABNORMAL HIGH (ref 4.0–10.5)
nRBC: 0 % (ref 0.0–0.2)

## 2024-09-07 LAB — TYPE AND SCREEN
ABO/RH(D): A POS
Antibody Screen: NEGATIVE

## 2024-09-07 LAB — CBG MONITORING, ED
Glucose-Capillary: 117 mg/dL — ABNORMAL HIGH (ref 70–99)
Glucose-Capillary: 158 mg/dL — ABNORMAL HIGH (ref 70–99)
Glucose-Capillary: 300 mg/dL — ABNORMAL HIGH (ref 70–99)

## 2024-09-07 LAB — C-REACTIVE PROTEIN: CRP: 13.7 mg/dL — ABNORMAL HIGH (ref <1.0)

## 2024-09-07 LAB — SEDIMENTATION RATE: Sed Rate: 114 mm/h — ABNORMAL HIGH (ref 0–22)

## 2024-09-07 LAB — MAGNESIUM: Magnesium: 2 mg/dL (ref 1.7–2.4)

## 2024-09-07 MED ORDER — CHLORHEXIDINE GLUCONATE CLOTH 2 % EX PADS
6.0000 | MEDICATED_PAD | Freq: Every day | CUTANEOUS | Status: DC
  Administered 2024-09-09 – 2024-09-13 (×5): 6 via TOPICAL

## 2024-09-07 MED ORDER — ASPIRIN 81 MG PO TBEC
81.0000 mg | DELAYED_RELEASE_TABLET | Freq: Every day | ORAL | Status: DC
  Administered 2024-09-07 – 2024-09-13 (×6): 81 mg via ORAL
  Filled 2024-09-07 (×7): qty 1

## 2024-09-07 MED ORDER — ASPIRIN 81 MG PO TBEC: 81.0000 mg | DELAYED_RELEASE_TABLET | Freq: Every day | ORAL | Status: DC

## 2024-09-07 MED ORDER — BRIMONIDINE TARTRATE 0.2 % OP SOLN
1.0000 [drp] | Freq: Two times a day (BID) | OPHTHALMIC | Status: DC
Start: 2024-09-07 — End: 2024-09-14
  Administered 2024-09-08 – 2024-09-13 (×11): 1 [drp] via OPHTHALMIC
  Filled 2024-09-07 (×2): qty 5

## 2024-09-07 MED ORDER — CLOPIDOGREL BISULFATE 75 MG PO TABS: 75.0000 mg | ORAL_TABLET | Freq: Every day | ORAL | Status: DC

## 2024-09-07 MED ORDER — CINACALCET HCL 30 MG PO TABS
90.0000 mg | ORAL_TABLET | Freq: Every day | ORAL | Status: DC
  Administered 2024-09-09 – 2024-09-13 (×5): 90 mg via ORAL
  Filled 2024-09-07 (×7): qty 3

## 2024-09-07 MED ORDER — CLOPIDOGREL BISULFATE 75 MG PO TABS
75.0000 mg | ORAL_TABLET | Freq: Every day | ORAL | Status: DC
  Administered 2024-09-07 – 2024-09-13 (×6): 75 mg via ORAL
  Filled 2024-09-07 (×7): qty 1

## 2024-09-07 NOTE — Inpatient Diabetes Management (Signed)
 Inpatient Diabetes Program Recommendations  AACE/ADA: New Consensus Statement on Inpatient Glycemic Control (2015)  Target Ranges:  Prepandial:   less than 140 mg/dL      Peak postprandial:   less than 180 mg/dL (1-2 hours)      Critically ill patients:  140 - 180 mg/dL   Lab Results  Component Value Date   GLUCAP 300 (H) 09/07/2024   HGBA1C 6.5 (H) 09/06/2024    Review of Glycemic Control  Diabetes history: DM2 Outpatient Diabetes medications: Insulin  pump Tandem T-slim with Dexcom G6 Current orders for Inpatient glycemic control: Novolog  0-6 Q6H  HgbA1C - 6.5% Endo - Norleen Lipoma, MD. Last OV - 08/31/2024  Pump settings: Basal - 0.60/H  (Total 14.4 units/day) Carb Ratio - 13 CF - 45 with target of 110 mg/dL  Will need basal insulin  and meal coverage when eating  Inpatient Diabetes Program Recommendations:    Consider Lantus  12 units daily  Consider Novolog  4 units TID with meals if eating > 50%  Will speak with pt on 10/8 regarding her insulin  pump, glycemic control and any questions/concerns she might have.   Continue to follow.  Thank you. Shona Brandy, RD, LDN, CDCES Inpatient Diabetes Coordinator [phone]

## 2024-09-07 NOTE — Consult Note (Signed)
 [facility] Renal Consultation Note    Indication for Consultation:  Management of ESRD/hemodialysis; anemia, hypertension/volume and secondary hyperparathyroidism  ERE:[id number], Jon HERO, PA  HPI: Gita Dilger is a 48 y.o. female with ESRD on HD MWF at [facility]. 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 She went to her scheduled podiatry appointment  yesterday for routine follow-up and advised she come to the ED for concern for left diabetic foot wound.  Podiatry is following and noted VVS is also consulted for worsening tissue loss.  We were consulted for ongoing dialysis management.  Patient recently had her PD catheter placed on 08/2424 by Dr. Cara.  She is currently receiving PD catheter flushes at her outpatient HD center.  Seen and examined patient at bedside in the ED.  Patient is on RA and current blood pressure is stable.  She reports her last HD was yesterday and received treatment for 3hrs. She denies SOB, CP, and N/V. She has functional perm vascular access. Plan for IHD for tomorrow and will work around scheduled lower extremity angiogram also scheduled for tomorrow.  Past Medical History:  Diagnosis Date   ADHD (attention deficit hyperactivity disorder)    Anemia    Arthritis    Asthma    mild = rarely uses inhaler   Blindness of left eye    retinopathy   Diabetes mellitus without complication (HCC)    type 2   Dyspnea    with exertion   Fibromyalgia    GERD (gastroesophageal reflux disease)    no meds   Hypertension    Lung nodule    1.0 cm LLL lung nodule 07/16/22 CT (evaluation by [facility] Dr. Allyne Render)   Osteomyelitis [facility])    RIGHT FOOT FIFTH TOE   Ovarian mass, left 06/26/2021   Renal disorder    ESRD   Sleep apnea    does not use cpap   Past Surgical History:  Procedure Laterality Date   ABDOMINAL AORTOGRAM W/LOWER EXTREMITY N/A 06/08/2018    Procedure: ABDOMINAL AORTOGRAM W/LOWER EXTREMITY;  Surgeon: Sheree Penne Bruckner, MD;  Location: [facility] INVASIVE CV LAB;  Service: Cardiovascular;  Laterality: N/A;   ABDOMINAL HYSTERECTOMY  09/2016   AMPUTATION Right 06/11/2018   Procedure: AMPUTATION RIGHT FIFTH TOE;  Surgeon: Eliza Bruckner RAMAN, MD;  Location: [facility] OR;  Service: Vascular;  Laterality: Right;   APPENDECTOMY     AV FISTULA PLACEMENT Left    CHOLECYSTECTOMY     COLON SURGERY     COLONOSCOPY  2023   CORONARY ATHERECTOMY N/A 01/27/2024   Procedure: CORONARY ATHERECTOMY;  Surgeon: Verlin Bruckner BIRCH, MD;  Location: [facility] INVASIVE CV LAB;  Service: Cardiovascular;  Laterality: N/A;   CORONARY STENT INTERVENTION N/A 01/27/2024   Procedure: CORONARY STENT INTERVENTION;  Surgeon: Verlin Bruckner BIRCH, MD;  Location: [facility] INVASIVE CV LAB;  Service: Cardiovascular;  Laterality: N/A;   cysts removed from top of head  84   age 63yr   EYE SURGERY Bilateral    retinal - left eye blind   EYE SURGERY Right    cataract removed with lens placement   INSERTION OF DIALYSIS CATHETER N/A 08/06/2022   Procedure: INSERTION OF RIGHT INTERNAL JUGULAR TUNNELED DIALYSIS CATHETER;  Surgeon: Eliza Bruckner RAMAN, MD;  Location: [facility] OR;  Service: Vascular;  Laterality: N/A;   LEFT HEART CATH AND CORONARY ANGIOGRAPHY N/A 01/27/2024   Procedure: LEFT HEART CATH AND CORONARY ANGIOGRAPHY;  Surgeon:  Verlin Lonni BIRCH, MD;  Location: [facility] INVASIVE CV LAB;  Service: Cardiovascular;  Laterality: N/A;   reimplantation of uterer     at age 19 yrs   REVISON OF ARTERIOVENOUS FISTULA Left 08/06/2022   Procedure: REVISON OF LEFT ARTERIOVENOUS FISTULA WITH REPAIR OF PSEUDOANEURYSMS AND SUPERFISTULIZATIONS;  Surgeon: Eliza Lonni RAMAN, MD;  Location: [facility] OR;  Service: Vascular;  Laterality: Left;   TRIGGER FINGER RELEASE     right hand ring finger   TUBAL LIGATION     UPPER GI ENDOSCOPY  2017   WISDOM TOOTH EXTRACTION     Family History  Problem  Relation Age of Onset   Breast cancer Mother    Diabetes Father    Breast cancer Maternal Grandmother    Stomach cancer Maternal Grandmother    Diabetes Paternal Grandmother    Breast cancer Maternal Aunt    COPD Maternal Aunt    COPD Maternal Aunt    Heart failure Maternal Aunt    COPD Maternal Aunt    Kidney cancer Maternal Aunt    Social History:  reports that she quit smoking about 4 years ago. Her smoking use included cigarettes. She started smoking about 29 years ago. She has a 25 pack-year smoking history. She has never been exposed to tobacco smoke. She has never used smokeless tobacco. She reports that she does not currently use alcohol . She reports current drug use. Drug: Marijuana. Allergies  Allergen Reactions   Furosemide Swelling   Heparin  Other (See Comments)    Patient relates vitreous hemorrhage after heparin . Patient relates vitreous hemorrhage after heparin .   Prior to Admission medications   Medication Sig Start Date End Date Taking? Authorizing Provider  albuterol  (VENTOLIN  HFA) 108 (90 Base) MCG/ACT inhaler Inhale 1-2 puffs into the lungs every 6 (six) hours as needed for wheezing or shortness of breath.   Yes [provider]  aspirin  EC 81 MG tablet Take 1 tablet (81 mg total) by mouth daily. Swallow whole. 02/26/24  Yes Wyn Jackee VEAR Mickey., NP  B Complex-C-Folic Acid  (RENA-VITE PO) Take 1 tablet by mouth in the morning.   Yes [provider]  brimonidine (ALPHAGAN) 0.2 % ophthalmic solution Place 1 drop into both eyes in the morning and at bedtime. 01/29/24  Yes [provider]  cinacalcet  (SENSIPAR ) 30 MG tablet Take 90 mg by mouth daily with breakfast. After hemodialysis   Yes [provider]  clopidogrel  (PLAVIX ) 75 MG tablet Take 1 tablet (75 mg total) by mouth daily. 03/01/24  Yes Turner, Wilbert SAUNDERS, MD  collagenase  (SANTYL ) 250 UNIT/GM ointment Apply 1 Application topically daily. Wound dimension (3 cmx 2.5 cm x 0.2 cm, 1 cm x  3.5 cm x 0.2 cm) Dispense QTY sufficient per manufactures dosing calculator. 08/23/24  Yes Sikora, Rebecca, DPM  ethyl chloride spray Apply 1 Application topically as needed (prior to dialysis). 09/29/18  Yes [provider]  FOSRENOL  1000 MG PACK Take 1,000-2,000 mg by mouth See admin instructions. Take 2 packets (2000 mg) by mouth with each meal & take 1 packet (1000 mg) by mouth with each snack 01/12/19  Yes [provider]  gabapentin  (NEURONTIN ) 100 MG capsule Take 100-200 mg by mouth See admin instructions. Take 2 capsules (200 mg) by mouth every morning & take 1 capsule (100 mg) by mouth at bedtime. 03/12/21  Yes [provider]  glucagon 1 MG injection Inject 1 mg into the muscle once as needed (low blood sugar). 06/16/18  Yes [provider]  insulin  aspart (NOVOLOG ) 100 UNIT/ML injection Inject 0-10 Units into the skin See admin instructions. Via insulin  pump   Yes [provider]  lidocaine  (LIDODERM ) 5 % Place 1 patch onto the skin daily. 11/27/23  Yes [provider]  metoprolol  tartrate (LOPRESSOR ) 25 MG tablet Take (12.5mg ) half tablet twice a day except on dialysis days Patient taking differently: Take 12.5 mg by mouth See admin instructions. Take (12.5mg ) half tablet twice a day except on dialysis days. Take 12.5mg  at night on dialysis. 07/21/24  Yes Wyn Jackee VEAR Mickey., NP  ketoconazole (NIZORAL) 2 % shampoo Apply 1 Application topically once a week. Patient not taking: Reported on 09/06/2024 09/03/21   [provider]   Current Facility-Administered Medications  Medication Dose Route Frequency Provider Last Rate Last Admin   acetaminophen  (TYLENOL ) tablet 650 mg  650 mg Oral Q6H PRN Howerter, Justin B, DO       Or   acetaminophen  (TYLENOL ) suppository 650 mg  650 mg Rectal Q6H PRN Howerter, Justin B, DO       aspirin  EC tablet 81 mg  81 mg Oral Daily Perri DELENA Meliton Mickey., MD   81 mg at 09/07/24 1044   brimonidine (ALPHAGAN)  0.2 % ophthalmic solution 1 drop  1 drop Both Eyes BID Perri DELENA Meliton Mickey., MD       [START ON 09/08/2024] cinacalcet  (SENSIPAR ) tablet 90 mg  90 mg Oral Q breakfast Perri DELENA Meliton Mickey., MD       clopidogrel  (PLAVIX ) tablet 75 mg  75 mg Oral Daily Perri DELENA Meliton Mickey., MD   75 mg at 09/07/24 1044   dextrose  50 % solution 50 mL  1 ampule Intravenous Q1H PRN Howerter, Justin B, DO       fentaNYL  (SUBLIMAZE ) injection 25 mcg  25 mcg Intravenous Q2H PRN Howerter, Justin B, DO   25 mcg at 09/07/24 0107   insulin  aspart (novoLOG ) injection 0-6 Units  0-6 Units Subcutaneous Q6H Howerter, Justin B, DO   1 Units at 09/06/24 2315   melatonin tablet 3 mg  3 mg Oral QHS PRN Howerter, Justin B, DO       naloxone (NARCAN) injection 0.4 mg  0.4 mg Intravenous PRN Howerter, Justin B, DO       ondansetron  (ZOFRAN ) injection 4 mg  4 mg Intravenous Q6H PRN Howerter, Justin B, DO       piperacillin -tazobactam (ZOSYN ) IVPB 2.25 g  2.25 g Intravenous Q8H Gaines Carrier, RPH   Stopped at 09/07/24 9257   silver  sulfADIAZINE  (SILVADENE ) 1 % cream   Topical BID Petery, John, DPM       [START ON 09/08/2024] vancomycin  (VANCOCIN ) IVPB 1000 mg/200 mL premix  1,000 mg Intravenous Q M,W,F-HD Gaines Carrier, [facility]       Current Outpatient Medications  Medication Sig Dispense Refill   albuterol  (VENTOLIN  HFA) 108 (90 Base) MCG/ACT inhaler Inhale 1-2 puffs into the lungs every 6 (six) hours as needed for wheezing or shortness of breath.     aspirin  EC 81 MG tablet Take 1 tablet (81 mg total) by mouth daily. Swallow whole. 90 tablet 3   B Complex-C-Folic Acid  (RENA-VITE PO) Take 1 tablet by mouth in the morning.     brimonidine (ALPHAGAN) 0.2 % ophthalmic solution Place 1 drop into both eyes in the morning and at bedtime.     cinacalcet  (SENSIPAR ) 30 MG tablet Take 90 mg by mouth daily with breakfast. After hemodialysis     clopidogrel  (PLAVIX ) 75  MG tablet Take 1 tablet (75 mg total) by mouth daily. 90 tablet 3    collagenase  (SANTYL ) 250 UNIT/GM ointment Apply 1 Application topically daily. Wound dimension (3 cmx 2.5 cm x 0.2 cm, 1 cm x 3.5 cm x 0.2 cm) Dispense QTY sufficient per manufactures dosing calculator. 15 g 0   ethyl chloride spray Apply 1 Application topically as needed (prior to dialysis).  2   FOSRENOL  1000 MG PACK Take 1,000-2,000 mg by mouth See admin instructions. Take 2 packets (2000 mg) by mouth with each meal & take 1 packet (1000 mg) by mouth with each snack     gabapentin  (NEURONTIN ) 100 MG capsule Take 100-200 mg by mouth See admin instructions. Take 2 capsules (200 mg) by mouth every morning & take 1 capsule (100 mg) by mouth at bedtime.     glucagon 1 MG injection Inject 1 mg into the muscle once as needed (low blood sugar).     insulin  aspart (NOVOLOG ) 100 UNIT/ML injection Inject 0-10 Units into the skin See admin instructions. Via insulin  pump     lidocaine  (LIDODERM ) 5 % Place 1 patch onto the skin daily.     metoprolol  tartrate (LOPRESSOR ) 25 MG tablet Take (12.5mg ) half tablet twice a day except on dialysis days (Patient taking differently: Take 12.5 mg by mouth See admin instructions. Take (12.5mg ) half tablet twice a day except on dialysis days. Take 12.5mg  at night on dialysis.) 180 tablet 1   ketoconazole (NIZORAL) 2 % shampoo Apply 1 Application topically once a week. (Patient not taking: Reported on 09/06/2024)     Labs: Basic Metabolic Panel: Recent Labs  Lab 09/06/24 1750 09/07/24 0609  NA 137 135  K 4.7 4.9  CL 96* 97*  CO2 26 24  GLUCOSE 123* 156*  BUN 30* 38*  CREATININE 5.96* 6.87*  CALCIUM  8.8* 8.4*   Liver Function Tests: Recent Labs  Lab 09/06/24 1750 09/07/24 0609  AST 30 18  ALT 14 11  ALKPHOS 266* 205*  BILITOT 0.6 0.9  PROT 7.8 6.3*  ALBUMIN  3.1* 2.4*   No results for input(s): LIPASE, AMYLASE in the last 168 hours. No results for input(s): AMMONIA in the last 168 hours. CBC: Recent Labs  Lab 09/06/24 1750 09/07/24 0609  WBC  13.8* 12.6*  NEUTROABS 10.1* 8.4*  HGB 10.3* 8.7*  HCT 33.5* 28.7*  MCV 97.4 98.0  PLT 392 325   Cardiac Enzymes: No results for input(s): CKTOTAL, CKMB, CKMBINDEX, TROPONINI in the last 168 hours. CBG: Recent Labs  Lab 09/06/24 2309 09/07/24 0607 09/07/24 1144  GLUCAP 151* 117* 158*   Iron Studies: No results for input(s): IRON, TIBC, TRANSFERRIN, FERRITIN in the last 72 hours. Studies/Results: VAS US  ABI WITH/WO TBI Result Date: 09/07/2024  LOWER EXTREMITY DOPPLER STUDY Patient Name:  Caitlin Ainley  Date of Exam:   09/07/2024 Medical Rec #: [id number]              Accession #:    [id number] Date of Birth: [birth year]              Patient Gender: F Patient Age:   65 years Exam Location:  [facility] Procedure:      VAS US  ABI WITH/WO TBI Referring Phys: PENNE COLORADO --------------------------------------------------------------------------------  Indications: DM foot infection High Risk Factors: Hypertension, Diabetes, past history of smoking, prior MI. Other Factors: ESRD(HD).  Comparison Study: Previous exam was on 06/07/2018 Performing Technologist: Leigh Rom RVT/RDMS  Examination Guidelines: A complete evaluation includes at minimum,  Doppler waveform signals and systolic blood pressure reading at the level of bilateral brachial, anterior tibial, and posterior tibial arteries, when vessel segments are accessible. Bilateral testing is considered an integral part of a complete examination. Photoelectric Plethysmograph (PPG) waveforms and toe systolic pressure readings are included as required and additional duplex testing as needed. Limited examinations for reoccurring indications may be performed as noted.  ABI Findings: +---------+------------------+-----+----------+--------+ Right    Rt Pressure (mmHg)IndexWaveform  Comment  +---------+------------------+-----+----------+--------+ Brachial 195                    triphasic           +---------+------------------+-----+----------+--------+ PTA      239               1.23 monophasic         +---------+------------------+-----+----------+--------+ DP       254               1.30 biphasic           +---------+------------------+-----+----------+--------+ Great Toe121               0.62 Abnormal           +---------+------------------+-----+----------+--------+ +---------+------------------+-----+----------+-------+ Left     Lt Pressure (mmHg)IndexWaveform  Comment +---------+------------------+-----+----------+-------+ Brachial                                  HD      +---------+------------------+-----+----------+-------+ PTA      254               1.30 monophasic        +---------+------------------+-----+----------+-------+ DP       254               1.30 biphasic          +---------+------------------+-----+----------+-------+ Great Toe52                0.27 Abnormal          +---------+------------------+-----+----------+-------+ +-------+-----------+-----------+------------+------------+ ABI/TBIToday's ABIToday's TBIPrevious ABIPrevious TBI +-------+-----------+-----------+------------+------------+ Right  [place]         0.62       1.26        0.56         +-------+-----------+-----------+------------+------------+ Left   [place]         0.27       1.29        0.93         +-------+-----------+-----------+------------+------------+  Waveforms seen better on duplex images.  Summary: Right: Resting right ankle-brachial index indicates noncompressible right lower extremity arteries. The right toe-brachial index is abnormal.  Left: Resting left ankle-brachial index indicates noncompressible left lower extremity arteries. The left toe-brachial index is abnormal.  *See table(s) above for measurements and observations.     Preliminary    MR FOOT LEFT WO CONTRAST Result Date: 09/06/2024 CLINICAL DATA:  Open wound and necrotic appearing  second toe. EXAM: MRI OF THE LEFT FOOT WITHOUT CONTRAST TECHNIQUE: Multiplanar, multisequence MR imaging of the left foot was performed. No intravenous contrast was administered. COMPARISON:  Radiographs, same date. FINDINGS: Large open wound involving medial and dorsal aspect of great toe. Surrounding changes cellulitis with skin thickening subcutaneous edema. Abnormal T1 and T2 signal intensity in the first metacarpal head, proximal and distal phalanges consistent with osteomyelitis. Do not see any definite findings for septic arthritis. No obvious involvement of the sesamoid bones. Tissue loss  involving the second toe with gas in the soft tissues suspected. Abnormal signal intensity in proximal middle and distal phalanges suggesting osteomyelitis. Abnormal T2 signal intensity in the distal phalanx of third toe is also worrisome for osteomyelitis. T2 signal abnormality distal aspect of the proximal phalanx of the fifth toe is also worrisome for osteomyelitis. No discrete drainable soft tissue abscess or findings for pyomyositis. Advanced fatty atrophy of foot musculature. IMPRESSION: 1. Multiple sites of osteomyelitis involving the forefoot as above. 2. No discrete drainable soft tissue abscess or findings for pyomyositis. Electronically Signed   By: MYRTIS Stammer M.D.   On: 09/06/2024 22:13   DG Foot Complete Left Result Date: 09/06/2024 Please see detailed radiograph report in office note.  DG Foot Complete Right Result Date: 09/06/2024 Please see detailed radiograph report in office note.   ROS: All others negative except those listed in HPI.   Physical Exam: Vitals:   09/07/24 0845 09/07/24 0900 09/07/24 1149 09/07/24 1200  BP:  (!) 138/46  (!) 156/54  Pulse: 73 72  76  Resp: 18 16  (!) 21  Temp:   99.3 F (37.4 C)   TempSrc:   Oral   SpO2: 94% 96%  100%  Weight:      Height:         General: Awake, alert, NAD Head: Sclera not icteric  Lungs: CTA bilaterally. No wheeze, rales or  rhonchi. Breathing is unlabored. Heart: RRR. No murmur, rubs or gallops.  Abdomen: PD catheter RUQ; suture CDI; ABD soft and non-tender Lower extremities: No b/l LE edema Neuro: AAOx3. Moves all extremities spontaneously. Dialysis Access: L perm access  Dialysis Orders:  [facility] - MWF [phone] (home therapies) *Called outpatient center today for HD records to be sent for us  to review*  Last Labs: Hgb 8.7, K 4.9, Ca 8.4, Alb 2.4  Assessment/Plan: Infected left diabetic foot wound - Podiatry and VVS following; on broad ABXs; MRI ordered. Per Podiatry, she may need right foot partial third toe amputation. ESRD -  on HD MWF. Plan for HD tomorrow and will work around scheduled angiogram. S/p PD catheter placement 9/24 by Dr. Cara. Plan to flush PD catheter while here. Hypertension/volume  - Euvolemic on exam. BP acceptable Anemia of CKD - Hgb 8.7. Getting records. No IV Fe while on ABXs Secondary Hyperparathyroidism -  Checking phos in AM Nutrition - Renal diet with fluid restriction  Charmaine Piety, NP [facility] 09/07/2024, 1:37 PM

## 2024-09-07 NOTE — ED Notes (Signed)
 EVS contacted to remove the trash.

## 2024-09-07 NOTE — Consult Note (Addendum)
 Hospital Consult    Reason for Consult:  BLE tissue loss Requesting Physician:  Perri MD MRN #:  [id number]  History of Present Illness: Karen [name] is a 48 y.o. 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   We were consulted for vascular evaluation of the patient.  She says this summer she went to the beach and unfortunately got a sunburn on both of her feet.  She has subsequently developed wounds on her feet from her sunburn, which has been followed by podiatry.  She went to her podiatrist office yesterday with concerns for worsening appearance of both of her feet, especially on the left.  She was found to have wet gangrene on her left foot and was sent to the ED for admission.  She denies any pain currently.  She endorses feeling feverish at home over the past couple of days.  She also says that her left foot started smelling bad a couple of days ago.  Past Medical History:  Diagnosis Date   ADHD (attention deficit hyperactivity disorder)    Anemia    Arthritis    Asthma    mild = rarely uses inhaler   Blindness of left eye    retinopathy   Diabetes mellitus without complication (HCC)    type 2   Dyspnea    with exertion   Fibromyalgia    GERD (gastroesophageal reflux disease)    no meds   Hypertension    Lung nodule    1.0 cm LLL lung nodule 07/16/22 CT (evaluation by [facility] Dr. Allyne Render)   Osteomyelitis [facility])    RIGHT FOOT FIFTH TOE   Ovarian mass, left 06/26/2021   Renal disorder    ESRD   Sleep apnea    does not use cpap    Past Surgical History:  Procedure Laterality Date   ABDOMINAL AORTOGRAM W/LOWER EXTREMITY N/A 06/08/2018   Procedure: ABDOMINAL AORTOGRAM W/LOWER EXTREMITY;  Surgeon: Sheree Penne Bruckner, MD;  Location: [facility] INVASIVE CV LAB;  Service: Cardiovascular;  Laterality: N/A;   ABDOMINAL HYSTERECTOMY  09/2016    AMPUTATION Right 06/11/2018   Procedure: AMPUTATION RIGHT FIFTH TOE;  Surgeon: Eliza Bruckner RAMAN, MD;  Location: [facility] OR;  Service: Vascular;  Laterality: Right;   APPENDECTOMY     AV FISTULA PLACEMENT Left    CHOLECYSTECTOMY     COLON SURGERY     COLONOSCOPY  2023   CORONARY ATHERECTOMY N/A 01/27/2024   Procedure: CORONARY ATHERECTOMY;  Surgeon: Verlin Bruckner BIRCH, MD;  Location: [facility] INVASIVE CV LAB;  Service: Cardiovascular;  Laterality: N/A;   CORONARY STENT INTERVENTION N/A 01/27/2024   Procedure: CORONARY STENT INTERVENTION;  Surgeon: Verlin Bruckner BIRCH, MD;  Location: [facility] INVASIVE CV LAB;  Service: Cardiovascular;  Laterality: N/A;   cysts removed from top of head  86   age 49yr   EYE SURGERY Bilateral    retinal - left eye blind   EYE SURGERY Right    cataract removed with lens placement   INSERTION OF DIALYSIS CATHETER N/A 08/06/2022   Procedure: INSERTION OF RIGHT INTERNAL JUGULAR TUNNELED DIALYSIS CATHETER;  Surgeon: Eliza Bruckner RAMAN, MD;  Location: [facility] OR;  Service: Vascular;  Laterality: N/A;   LEFT HEART CATH AND CORONARY ANGIOGRAPHY N/A 01/27/2024   Procedure: LEFT HEART CATH AND CORONARY ANGIOGRAPHY;  Surgeon: Verlin Bruckner BIRCH, MD;  Location: [facility] INVASIVE CV LAB;  Service: Cardiovascular;  Laterality: N/A;   reimplantation of uterer     at age 60 yrs   REVISON OF ARTERIOVENOUS FISTULA Left 08/06/2022   Procedure: REVISON OF LEFT ARTERIOVENOUS FISTULA WITH REPAIR OF PSEUDOANEURYSMS AND SUPERFISTULIZATIONS;  Surgeon: Eliza Lonni RAMAN, MD;  Location: [facility] OR;  Service: Vascular;  Laterality: Left;   TRIGGER FINGER RELEASE     right hand ring finger   TUBAL LIGATION     UPPER GI ENDOSCOPY  2017   WISDOM TOOTH EXTRACTION      Allergies  Allergen Reactions   Furosemide Swelling   Heparin  Other (See Comments)    Patient relates vitreous hemorrhage after heparin . Patient relates vitreous hemorrhage after heparin .    Prior to Admission medications    Medication Sig Start Date End Date Taking? Authorizing Provider  albuterol  (VENTOLIN  HFA) 108 (90 Base) MCG/ACT inhaler Inhale 1-2 puffs into the lungs every 6 (six) hours as needed for wheezing or shortness of breath.   Yes [provider]  aspirin  EC 81 MG tablet Take 1 tablet (81 mg total) by mouth daily. Swallow whole. 02/26/24  Yes Wyn Jackee VEAR Mickey., NP  B Complex-C-Folic Acid  (RENA-VITE PO) Take 1 tablet by mouth in the morning.   Yes [provider]  brimonidine (ALPHAGAN) 0.2 % ophthalmic solution Place 1 drop into both eyes in the morning and at bedtime. 01/29/24  Yes [provider]  cinacalcet  (SENSIPAR ) 30 MG tablet Take 90 mg by mouth daily with breakfast. After hemodialysis   Yes [provider]  clopidogrel  (PLAVIX ) 75 MG tablet Take 1 tablet (75 mg total) by mouth daily. 03/01/24  Yes Turner, Wilbert SAUNDERS, MD  collagenase  (SANTYL ) 250 UNIT/GM ointment Apply 1 Application topically daily. Wound dimension (3 cmx 2.5 cm x 0.2 cm, 1 cm x 3.5 cm x 0.2 cm) Dispense QTY sufficient per manufactures dosing calculator. 08/23/24  Yes Sikora, Rebecca, DPM  ethyl chloride spray Apply 1 Application topically as needed (prior to dialysis). 09/29/18  Yes [provider]  FOSRENOL  1000 MG PACK Take 1,000-2,000 mg by mouth See admin instructions. Take 2 packets (2000 mg) by mouth with each meal & take 1 packet (1000 mg) by mouth with each snack 01/12/19  Yes [provider]  gabapentin  (NEURONTIN ) 100 MG capsule Take 100-200 mg by mouth See admin instructions. Take 2 capsules (200 mg) by mouth every morning & take 1 capsule (100 mg) by mouth at bedtime. 03/12/21  Yes [provider]  glucagon 1 MG injection Inject 1 mg into the muscle once as needed (low blood sugar). 06/16/18  Yes [provider]  insulin  aspart (NOVOLOG ) 100 UNIT/ML injection Inject 0-10 Units into the skin See admin instructions. Via insulin  pump   Yes [provider]  lidocaine  (LIDODERM ) 5 % Place 1 patch onto the skin daily. 11/27/23  Yes [provider]  metoprolol  tartrate (LOPRESSOR ) 25 MG tablet Take (12.5mg ) half tablet twice a day except on dialysis days Patient taking differently: Take 12.5 mg by mouth See admin instructions. Take (12.5mg ) half tablet twice a day except on dialysis days. Take 12.5mg  at night on dialysis. 07/21/24  Yes Wyn Jackee VEAR Mickey., NP  ketoconazole (NIZORAL) 2 % shampoo Apply 1 Application topically once a week. Patient not taking: Reported on 09/06/2024 09/03/21   [provider]    Social History   Socioeconomic History   Marital status: Legally Separated    Spouse name: Not on file   Number of children: Not on file  Years of education: Not on file   Highest education level: Not on file  Occupational History   Not on file  Tobacco Use   Smoking status: Former    Current packs/day: 0.00    Average packs/day: 1 pack/day for 25.0 years (25.0 ttl pk-yrs)    Types: Cigarettes    Start date: 08/03/1995    Quit date: 08/02/2020    Years since quitting: 4.1    Passive exposure: Never   Smokeless tobacco: Never  Vaping Use   Vaping status: Never Used  Substance and Sexual Activity   Alcohol  use: Not Currently    Comment: occ   Drug use: Yes    Types: Marijuana    Comment: Last use was on 08/01/22   Sexual activity: Not Currently    Birth control/protection: Surgical    Comment: Hysterectomy  Other Topics Concern   Not on file  Social History Narrative   Not on file   Social Drivers of Health   Financial Resource Strain: Medium Risk (07/28/2024)   Received from [facility]   Overall Financial Resource Strain (CARDIA)    How hard is it for you to pay for the very basics like food, housing, medical care, and heating?: Somewhat hard  Food Insecurity: Food Insecurity Present (08/25/2024)   Received from [facility]   Hunger Vital Sign    Within the past 12 months, you worried that your food  would run out before you got the money to buy more.: Sometimes true    Within the past 12 months, the food you bought just didn't last and you didn't have money to get more.: Often true  Transportation Needs: No Transportation Needs (08/25/2024)   Received from [facility] - Transportation    In the past 12 months, has lack of transportation kept you from medical appointments or from getting medications?: No    In the past 12 months, has lack of transportation kept you from meetings, work, or from getting things needed for daily living?: No  Physical Activity: Inactive (07/28/2024)   Received from [facility]   Exercise Vital Sign    On average, how many days per week do you engage in moderate to strenuous exercise (like a brisk walk)?: 0 days    Minutes of Exercise per Session: Not on file  Stress: No Stress Concern Present (08/25/2024)   Received from [facility] - Occupational Stress Questionnaire    Do you feel stress - tense, restless, nervous, or anxious, or unable to sleep at night because your mind is troubled all the time - these days?: Only a little  Social Connections: Socially Integrated (07/28/2024)   Received from [facility]   Social Network    How would you rate your social network (family, work, friends)?: Good participation with social networks  Intimate Partner Violence: Not At Risk (08/25/2024)   Received from [facility]   HITS    Over the last 12 months how often did your partner physically hurt you?: Never    Over the last 12 months how often did your partner insult you or talk down to you?: Never    Over the last 12 months how often did your partner threaten you with physical harm?: Never    Over the last 12 months how often did your partner scream or curse at you?: Never    Family History  Problem Relation Age of Onset   Breast cancer  Mother    Diabetes Father    Breast cancer Maternal Grandmother     Stomach cancer Maternal Grandmother    Diabetes Paternal Grandmother    Breast cancer Maternal Aunt    COPD Maternal Aunt    COPD Maternal Aunt    Heart failure Maternal Aunt    COPD Maternal Aunt    Kidney cancer Maternal Aunt     ROS: Otherwise negative unless mentioned in HPI  Physical Examination  Vitals:   09/07/24 0845 09/07/24 0900  BP:  (!) 138/46  Pulse: 73 72  Resp: 18 16  Temp:    SpO2: 94% 96%   Body mass index is 33.02 kg/m.  General:  WDWN in NAD Gait: Not observed HENT: WNL, normocephalic Pulmonary: normal non-labored breathing Cardiac: regular Abdomen:  soft, NT/ND, no masses Skin: without rashes Vascular Exam/Pulses: palpable femoral pulses bilaterally. Nonpalpable popliteal or pedal pulses. Intact DP/PT doppler signals bilaterally Extremities: ulcerations to the dorsal right foot with dry gangrenous changes to the right 2nd and 3rd toes. Wet gangrene of several toes on the left. Erythema and blistering to the top and bottom of the left foot from the toes to the midfoot Musculoskeletal: no muscle wasting or atrophy  Neurologic: A&O X 3;  No focal weakness or paresthesias are detected; speech is fluent/normal Psychiatric:  The pt has Normal affect. Lymph:  Unremarkable         CBC    Component Value Date/Time   WBC 12.6 (H) 09/07/2024 0609   RBC 2.93 (L) 09/07/2024 0609   HGB 8.7 (L) 09/07/2024 0609   HGB 10.7 (L) 07/14/2024 1141   HCT 28.7 (L) 09/07/2024 0609   HCT 32.7 (L) 07/14/2024 1141   PLT 325 09/07/2024 0609   PLT 339 07/14/2024 1141   MCV 98.0 09/07/2024 0609   MCV 95 07/14/2024 1141   MCH 29.7 09/07/2024 0609   MCHC 30.3 09/07/2024 0609   RDW 14.1 09/07/2024 0609   RDW 13.0 07/14/2024 1141   LYMPHSABS 2.7 09/07/2024 0609   LYMPHSABS 1.6 07/14/2024 1141   MONOABS 1.1 (H) 09/07/2024 0609   EOSABS 0.3 09/07/2024 0609   EOSABS 0.3 07/14/2024 1141   BASOSABS 0.1 09/07/2024 0609   BASOSABS 0.1 07/14/2024 1141    BMET     Component Value Date/Time   NA 135 09/07/2024 0609   K 4.9 09/07/2024 0609   CL 97 (L) 09/07/2024 0609   CO2 24 09/07/2024 0609   GLUCOSE 156 (H) 09/07/2024 0609   BUN 38 (H) 09/07/2024 0609   CREATININE 6.87 (H) 09/07/2024 0609   CALCIUM  8.4 (L) 09/07/2024 0609   GFRNONAA 7 (L) 09/07/2024 0609   GFRAA 7 (L) 06/12/2018 0326    COAGS: Lab Results  Component Value Date   INR 1.1 09/06/2024     Non-Invasive Vascular Imaging:   ABI Findings:  +---------+------------------+-----+----------+--------+  Right   Rt Pressure (mmHg)IndexWaveform  Comment   +---------+------------------+-----+----------+--------+  Brachial 195                    triphasic           +---------+------------------+-----+----------+--------+  PTA     239               1.23 monophasic          +---------+------------------+-----+----------+--------+  DP      254               1.30 biphasic            +---------+------------------+-----+----------+--------+  Great Toe121               0.62 Abnormal            +---------+------------------+-----+----------+--------+   +---------+------------------+-----+----------+-------+  Left    Lt Pressure (mmHg)IndexWaveform  Comment  +---------+------------------+-----+----------+-------+  Brachial                                  HD       +---------+------------------+-----+----------+-------+  PTA     254               1.30 monophasic         +---------+------------------+-----+----------+-------+  DP      254               1.30 biphasic           +---------+------------------+-----+----------+-------+  Great Toe52                0.27 Abnormal           +---------+------------------+-----+----------+-------+   +-------+-----------+-----------+------------+------------+  ABI/TBIToday's ABIToday's TBIPrevious ABIPrevious TBI  +-------+-----------+-----------+------------+------------+  Right [place]          0.62       1.26        0.56          +-------+-----------+-----------+------------+------------+  Left  [place]         0.27       1.29        0.93          +-------+-----------+-----------+------------+------------+       Waveforms seen better on duplex images.    Summary:  Right: Resting right ankle-brachial index indicates noncompressible right  lower extremity arteries. The right toe-brachial index is abnormal.    Left: Resting left ankle-brachial index indicates noncompressible left  lower extremity arteries. The left toe-brachial index is abnormal.   Statin:  No. Beta Blocker:  Yes.   Aspirin :  Yes.   ACEI:  No. ARB:  No. CCB use:  No Other antiplatelets/anticoagulants:  Yes.   Plavix    ASSESSMENT/PLAN: This is a 48 y.o. female admitted with left foot osteomyelitis   - The patient was admitted overnight for worsening bilateral lower extremity tissue loss.  She initially presented to her podiatrist office yesterday and was encouraged to go to the ED for wet gangrene of the left foot - The patient sustained significant sunburn to both of her feet this summer when she was at the beach.  This subsequently turned into blisters and worsening wounds.  She has been under the care of podiatry.  Unfortunately her tissue loss has progressed - MRI of the left foot yesterday demonstrates multiple sites of osteomyelitis involving the forefoot - On exam the patient has palpable femoral pulses.  She has nonpalpable popliteal or pedal pulses.  She does have brisk DP/PT Doppler signals bilaterally.  She has wet gangrene of her left forefoot with blistering and erythema to the bottom of her foot.  She also has an ulceration to the dorsal aspect of her right foot, with dry gangrenous changes to her right 2nd and 3rd toes.  She does have a well-healed right fifth toe amputation site. - I have explained to the patient that she does have evidence of critical limb ischemia with tissue loss  of the bilateral lower extremities, left greater than right.  She does not appear to have adequate blood flow to her feet to heal her  wounds.  Her ABIs are pending.  She would benefit from bilateral lower extremity angiogram with possible revascularization prior to any further surgical intervention with podiatry.  She is aware that she will require a future amputation on the left.  - We can plan for bilateral lower extremity angiogram tomorrow in the Cath Lab.  The patient is agreeable. Please keep NPO at midnight   [facility] PA-C [facility] [phone]   I have independently interviewed and examined patient and agree with PA assessment and plan above.  Plan for angiography from right common femoral approach to evaluate both lower extremities possibly intervene on the left.  She will require left transmetatarsal amputation pending revascularization and will allow the right sided wounds to demarcate.  Rosalba Totty C. Sheree, MD [facility] [place] Office: [phone] Pager: [phone]

## 2024-09-07 NOTE — ED Notes (Signed)
 pt is difficult stick and has already had ABX. Per report it was agreed that we would not pursue second set of [facility]

## 2024-09-07 NOTE — Progress Notes (Addendum)
 PROGRESS NOTE    Karen [name]  FMW:[id number] DOB: 05/12/1976 DOA: 09/06/2024 PCP: Lesa Jon HERO, PA  Chief Complaint  Patient presents with   Wound Check    Brief Narrative:    Karen [name] is Karen [name] 48 y.o. 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   Assessment & Plan:   Principal Problem:   Severe sepsis (HCC) Active Problems:   DM2 (diabetes mellitus, type 2) (HCC)   End stage renal disease on dialysis (HCC)   Cellulitis of left lower extremity   Fever   Leukocytosis  Diabetic Foot Infection Osteomyelitis  Sepsis due to Diabetic Foot Infection  MRI with multiple sites of osteomyelitis involving the forefoot (left) ABI's pending Podiatry following, recommending vascular c/s Continue broad spectrum abx  T2DM Continue SSI  On insulin  pump at home A1c 6.5  CAD s/p recent PCI S/p PTCA/orbital atherectomy/DESx1 mid LAD 01/2024 - recommended DAPT with aspirin /brilinta  x1 year at that time (medical management of disease in small caliber distal circumflex) - Per recent cardiology note 07/2024, can hold aspirin  and plavix  5-7 days if possible hold aspirin  and continue plavix  will clarify with podiatry whether they'd prefer antiplatelets held (per discussion with Dr. Malvin, ok with continuing) Curbsided cards who noted it was ok to hold 5-7 days if needed, but resume ASP Will continue DAPT for now  ESRD  Renal c/s for volume/dialysis management   Glaucoma Eye drops   Obesity Body mass index is 33.02 kg/m.    DVT prophylaxis: SCD Code Status: full Family Communication: none Disposition:   Status is:  Inpatient Remains inpatient appropriate because: need for continued inpatient care   Consultants:  Renal Podiatry vascular  Procedures:  none  Antimicrobials:  Anti-infectives (From admission, onward)    Start     Dose/Rate Route Frequency Ordered Stop   09/08/24 1200  vancomycin  (VANCOCIN ) IVPB 1000 mg/200 mL premix        1,000 mg 200 mL/hr over 60 Minutes Intravenous Every M-W-F (Hemodialysis) 09/06/24 2152     09/06/24 2200  piperacillin -tazobactam (ZOSYN ) IVPB 2.25 g        2.25 g 100 mL/hr over 30 Minutes Intravenous Every 8 hours 09/06/24 2148     09/06/24 2200  vancomycin  (VANCOREADY) IVPB 2000 mg/400 mL        2,000 mg 200 mL/hr over 120 Minutes Intravenous  Once 09/06/24 2152 09/07/24 0059   09/06/24 1900  Ampicillin-Sulbactam (UNASYN) 3 g in sodium chloride  0.9 % 100 mL IVPB       Placed in And Linked Group   3 g 200 mL/hr over 30 Minutes Intravenous  Once 09/06/24 1858 09/06/24 2019   09/06/24 1900  linezolid (ZYVOX) IVPB 600 mg       Placed in And Linked Group   600 mg 300 mL/hr over 60 Minutes Intravenous  Once 09/06/24 1858 09/06/24 2057       Subjective: No complaints  Objective: Vitals:   09/07/24 0816 09/07/24 0830 09/07/24 0845 09/07/24 0900  BP:    (!) 138/46  Pulse:  75 73 72  Resp:  17 18 16   Temp:      TempSrc:      SpO2: 100% 95% 94% 96%  Weight:  Height:       No intake or output data in the 24 hours ending 09/07/24 0929 Filed Weights   09/06/24 1630  Weight: 90 kg    Examination:  General exam: Appears calm and comfortable  Respiratory system: Clear to auscultation. Respiratory effort normal. Cardiovascular system: RRR Central nervous system: Alert and oriented. No focal neurological deficits. Extremities: boots on bilateral LE's - images reviewed in media   Data Reviewed: I have personally reviewed following labs and imaging studies  CBC: Recent Labs  Lab 09/06/24 1750 09/07/24 0609  WBC 13.8* 12.6*   NEUTROABS 10.1* 8.4*  HGB 10.3* 8.7*  HCT 33.5* 28.7*  MCV 97.4 98.0  PLT 392 325    Basic Metabolic Panel: Recent Labs  Lab 09/06/24 1750 09/07/24 0609  NA 137 135  K 4.7 4.9  CL 96* 97*  CO2 26 24  GLUCOSE 123* 156*  BUN 30* 38*  CREATININE 5.96* 6.87*  CALCIUM  8.8* 8.4*  MG 2.2 2.0    GFR: Estimated Creatinine Clearance: 11.1 mL/min (Desira Alessandrini) (by C-G formula based on SCr of 6.87 mg/dL (H)).  Liver Function Tests: Recent Labs  Lab 09/06/24 1750 09/07/24 0609  AST 30 18  ALT 14 11  ALKPHOS 266* 205*  BILITOT 0.6 0.9  PROT 7.8 6.3*  ALBUMIN  3.1* 2.4*    CBG: Recent Labs  Lab 09/06/24 2309 09/07/24 0607  GLUCAP 151* 117*     Recent Results (from the past 240 hours)  Culture, blood (Routine x 2)     Status: None (Preliminary result)   Collection Time: 09/06/24  4:33 PM   Specimen: BLOOD RIGHT ARM  Result Value Ref Range Status   Specimen Description BLOOD RIGHT ARM  Final   Special Requests   Final    BOTTLES DRAWN AEROBIC AND ANAEROBIC Blood Culture adequate volume   Culture   Final    NO GROWTH < 24 HOURS Performed at [facility], [address], [place], [place] [address]    Report Status PENDING  Incomplete  Resp panel by RT-PCR (RSV, Flu Madina Galati&B, Covid) Anterior Nasal Swab     Status: None   Collection Time: 09/06/24  6:54 PM   Specimen: Anterior Nasal Swab  Result Value Ref Range Status   SARS Coronavirus 2 by RT PCR NEGATIVE NEGATIVE Final   Influenza Greenley Martone by PCR NEGATIVE NEGATIVE Final   Influenza B by PCR NEGATIVE NEGATIVE Final    Comment: (NOTE) The Xpert Xpress SARS-CoV-2/FLU/RSV plus assay is intended as an aid in the diagnosis of influenza from Nasopharyngeal swab specimens and should not be used as [place] sole basis for treatment. Nasal washings and aspirates are unacceptable for Xpert Xpress SARS-CoV-2/FLU/RSV testing.  Fact Sheet for Patients: BloggerCourse.com  Fact Sheet for Healthcare  Providers: SeriousBroker.it  This test is not yet approved or cleared by the [place]  FDA and has been authorized for detection and/or diagnosis of SARS-CoV-2 by FDA under an Emergency Use Authorization (EUA). This EUA will remain in effect (meaning this test can be used) for the duration of the COVID-19 declaration under Section 564(b)(1) of the Act, 21 U.S.C. section 360bbb-3(b)(1), unless the authorization is terminated or revoked.     Resp Syncytial Virus by PCR NEGATIVE NEGATIVE Final    Comment: (NOTE) Fact Sheet for Patients: BloggerCourse.com  Fact Sheet for Healthcare Providers: SeriousBroker.it  This test is not yet approved or cleared by the [place]  FDA and has been authorized for detection and/or diagnosis of SARS-CoV-2 by  FDA under an Emergency Use Authorization (EUA). This EUA will remain in effect (meaning this test can be used) for the duration of the COVID-19 declaration under Section 564(b)(1) of the Act, 21 U.S.C. section 360bbb-3(b)(1), unless the authorization is terminated or revoked.  Performed at [facility], [address], [place], [place] [address]          Radiology Studies: MR FOOT LEFT WO CONTRAST Result Date: 09/06/2024 CLINICAL DATA:  Open wound and necrotic appearing second toe. EXAM: MRI OF THE LEFT FOOT WITHOUT CONTRAST TECHNIQUE: Multiplanar, multisequence MR imaging of the left foot was performed. No intravenous contrast was administered. COMPARISON:  Radiographs, same date. FINDINGS: Large open wound involving medial and dorsal aspect of great toe. Surrounding changes cellulitis with skin thickening subcutaneous edema. Abnormal T1 and T2 signal intensity in the first metacarpal head, proximal and distal phalanges consistent with osteomyelitis. Do not see any definite findings for septic arthritis. No obvious involvement of the sesamoid bones. Tissue  loss involving the second toe with gas in the soft tissues suspected. Abnormal signal intensity in proximal middle and distal phalanges suggesting osteomyelitis. Abnormal T2 signal intensity in the distal phalanx of third toe is also worrisome for osteomyelitis. T2 signal abnormality distal aspect of the proximal phalanx of the fifth toe is also worrisome for osteomyelitis. No discrete drainable soft tissue abscess or findings for pyomyositis. Advanced fatty atrophy of foot musculature. IMPRESSION: 1. Multiple sites of osteomyelitis involving the forefoot as above. 2. No discrete drainable soft tissue abscess or findings for pyomyositis. Electronically Signed   By: MYRTIS Stammer M.D.   On: 09/06/2024 22:13   DG Foot Complete Left Result Date: 09/06/2024 Please see detailed radiograph report in office note.  DG Foot Complete Right Result Date: 09/06/2024 Please see detailed radiograph report in office note.       Scheduled Meds:  insulin  aspart  0-6 Units Subcutaneous Q6H   silver  sulfADIAZINE    Topical BID   Continuous Infusions:  piperacillin -tazobactam (ZOSYN )  IV Stopped (09/07/24 0742)   [START ON 09/08/2024] vancomycin        LOS: 1 day    Time spent: over 30 min    Meliton Monte, MD [facility]   To contact the attending provider between 7A-7P or the covering provider during after hours 7P-7A, please log into the web site [email or web address] and access using universal  password for that web site. If you do not have the password, please call the hospital operator.  09/07/2024, 9:29 AM
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
## 2024-09-07 NOTE — Progress Notes (Signed)
 ABI has been completed.   Results can be found under chart review under CV PROC. 09/07/2024 11:47 AM Bernice Mcauliffe RVT, RDMS

## 2024-09-07 NOTE — ED Notes (Signed)
 Introduced myself to pt. Had to call pt name multiple times before she woke up. Pt then stated that she was in so much pain and that she needed a dose of Dilaudid  in order to help her out. Pt very adamant about Dilaudid 

## 2024-09-08 ENCOUNTER — Encounter (HOSPITAL_COMMUNITY)
Admission: EM | Disposition: A | Discharge status: Home Health | Payer: Self-pay | Source: Ambulatory Visit | Attending: Internal Medicine

## 2024-09-08 DIAGNOSIS — M86172 Other acute osteomyelitis, left ankle and foot: Secondary | ICD-10-CM | POA: Diagnosis not present

## 2024-09-08 DIAGNOSIS — I70222 Atherosclerosis of native arteries of extremities with rest pain, left leg: Secondary | ICD-10-CM | POA: Diagnosis not present

## 2024-09-08 DIAGNOSIS — M86171 Other acute osteomyelitis, right ankle and foot: Secondary | ICD-10-CM

## 2024-09-08 DIAGNOSIS — I70293 Other atherosclerosis of native arteries of extremities, bilateral legs: Secondary | ICD-10-CM

## 2024-09-08 DIAGNOSIS — S91302A Unspecified open wound, left foot, initial encounter: Secondary | ICD-10-CM

## 2024-09-08 DIAGNOSIS — I70291 Other atherosclerosis of native arteries of extremities, right leg: Secondary | ICD-10-CM

## 2024-09-08 DIAGNOSIS — S91301A Unspecified open wound, right foot, initial encounter: Secondary | ICD-10-CM | POA: Diagnosis not present

## 2024-09-08 DIAGNOSIS — I70223 Atherosclerosis of native arteries of extremities with rest pain, bilateral legs: Secondary | ICD-10-CM | POA: Diagnosis not present

## 2024-09-08 DIAGNOSIS — I70292 Other atherosclerosis of native arteries of extremities, left leg: Secondary | ICD-10-CM

## 2024-09-08 DIAGNOSIS — R6520 Severe sepsis without septic shock: Secondary | ICD-10-CM | POA: Diagnosis not present

## 2024-09-08 DIAGNOSIS — I96 Gangrene, not elsewhere classified: Secondary | ICD-10-CM | POA: Diagnosis not present

## 2024-09-08 DIAGNOSIS — I70262 Atherosclerosis of native arteries of extremities with gangrene, left leg: Secondary | ICD-10-CM

## 2024-09-08 DIAGNOSIS — A419 Sepsis, unspecified organism: Secondary | ICD-10-CM | POA: Diagnosis not present

## 2024-09-08 DIAGNOSIS — M7989 Other specified soft tissue disorders: Secondary | ICD-10-CM

## 2024-09-08 HISTORY — PX: LOWER EXTREMITY ANGIOGRAPHY: CATH118251

## 2024-09-08 HISTORY — PX: LOWER EXTREMITY INTERVENTION: CATH118252

## 2024-09-08 LAB — CBC WITH DIFFERENTIAL/PLATELET
Abs Immature Granulocytes: 0.08 K/uL — ABNORMAL HIGH (ref 0.00–0.07)
Basophils Absolute: 0.1 K/uL (ref 0.0–0.1)
Basophils Relative: 1 %
Eosinophils Absolute: 0.3 K/uL (ref 0.0–0.5)
Eosinophils Relative: 3 %
HCT: 27.7 % — ABNORMAL LOW (ref 36.0–46.0)
Hemoglobin: 8.7 g/dL — ABNORMAL LOW (ref 12.0–15.0)
Immature Granulocytes: 1 %
Lymphocytes Relative: 17 %
Lymphs Abs: 2 K/uL (ref 0.7–4.0)
MCH: 29.6 pg (ref 26.0–34.0)
MCHC: 31.4 g/dL (ref 30.0–36.0)
MCV: 94.2 fL (ref 80.0–100.0)
Monocytes Absolute: 0.9 K/uL (ref 0.1–1.0)
Monocytes Relative: 8 %
Neutro Abs: 8.4 K/uL — ABNORMAL HIGH (ref 1.7–7.7)
Neutrophils Relative %: 70 %
Platelets: 331 K/uL (ref 150–400)
RBC: 2.94 MIL/uL — ABNORMAL LOW (ref 3.87–5.11)
RDW: 13.5 % (ref 11.5–15.5)
WBC: 11.7 K/uL — ABNORMAL HIGH (ref 4.0–10.5)
nRBC: 0 % (ref 0.0–0.2)

## 2024-09-08 LAB — CBG MONITORING, ED
Glucose-Capillary: 169 mg/dL — ABNORMAL HIGH (ref 70–99)
Glucose-Capillary: 183 mg/dL — ABNORMAL HIGH (ref 70–99)
Glucose-Capillary: 211 mg/dL — ABNORMAL HIGH (ref 70–99)
Glucose-Capillary: 272 mg/dL — ABNORMAL HIGH (ref 70–99)

## 2024-09-08 LAB — GLUCOSE, CAPILLARY
Glucose-Capillary: 175 mg/dL — ABNORMAL HIGH (ref 70–99)
Glucose-Capillary: 334 mg/dL — ABNORMAL HIGH (ref 70–99)

## 2024-09-08 MED ORDER — GABAPENTIN 100 MG PO CAPS: 100.0000 mg | ORAL_CAPSULE | ORAL | Status: DC

## 2024-09-08 MED ORDER — SODIUM CHLORIDE 0.9 % IV SOLN: 250.0000 mL | INTRAVENOUS | Status: AC | PRN

## 2024-09-08 MED ORDER — ALTEPLASE 2 MG IJ SOLR: 2.0000 mg | Freq: Once | INTRAMUSCULAR | Status: DC | PRN

## 2024-09-08 MED ORDER — LIDOCAINE HCL (PF) 1 % IJ SOLN
INTRAMUSCULAR | Status: AC
  Filled 2024-09-08: qty 30

## 2024-09-08 MED ORDER — LIDOCAINE HCL (PF) 1 % IJ SOLN
INTRAMUSCULAR | Status: DC | PRN
  Administered 2024-09-08: 5 mL

## 2024-09-08 MED ORDER — FENTANYL CITRATE (PF) 100 MCG/2ML IJ SOLN
INTRAMUSCULAR | Status: DC | PRN
  Administered 2024-09-08: 50 ug via INTRAVENOUS

## 2024-09-08 MED ORDER — SODIUM CHLORIDE 0.9% FLUSH: 3.0000 mL | INTRAVENOUS | Status: DC | PRN

## 2024-09-08 MED ORDER — LIDOCAINE HCL (PF) 1 % IJ SOLN: 5.0000 mL | INTRAMUSCULAR | Status: DC | PRN

## 2024-09-08 MED ORDER — LIDOCAINE-PRILOCAINE 2.5-2.5 % EX CREA: 1.0000 | TOPICAL_CREAM | CUTANEOUS | Status: DC | PRN

## 2024-09-08 MED ORDER — ACETAMINOPHEN 325 MG PO TABS
650.0000 mg | ORAL_TABLET | ORAL | Status: DC | PRN
  Administered 2024-09-10 (×3): 650 mg via ORAL
  Filled 2024-09-08: qty 2

## 2024-09-08 MED ORDER — MIDAZOLAM HCL 2 MG/2ML IJ SOLN
INTRAMUSCULAR | Status: AC
  Filled 2024-09-08: qty 2

## 2024-09-08 MED ORDER — SODIUM CHLORIDE 0.9 % WEIGHT BASED INFUSION: 1.0000 mL/kg/h | INTRAVENOUS | Status: AC

## 2024-09-08 MED ORDER — PENTAFLUOROPROP-TETRAFLUOROETH EX AERO: 1.0000 | INHALATION_SPRAY | CUTANEOUS | Status: DC | PRN

## 2024-09-08 MED ORDER — ANTICOAGULANT SODIUM CITRATE 4% (200MG/5ML) IV SOLN: 5.0000 mL | Status: DC | PRN

## 2024-09-08 MED ORDER — FENTANYL CITRATE (PF) 100 MCG/2ML IJ SOLN
INTRAMUSCULAR | Status: AC
  Filled 2024-09-08: qty 2

## 2024-09-08 MED ORDER — HEPARIN SODIUM (PORCINE) 1000 UNIT/ML IJ SOLN
INTRAMUSCULAR | Status: DC | PRN
  Administered 2024-09-08: 8000 [IU] via INTRAVENOUS

## 2024-09-08 MED ORDER — MIDAZOLAM HCL 2 MG/2ML IJ SOLN
INTRAMUSCULAR | Status: DC | PRN
  Administered 2024-09-08: 1 mg via INTRAVENOUS

## 2024-09-08 MED ORDER — SODIUM CHLORIDE 0.9% FLUSH
3.0000 mL | Freq: Two times a day (BID) | INTRAVENOUS | Status: DC
  Administered 2024-09-09 – 2024-09-12 (×9): 3 mL via INTRAVENOUS

## 2024-09-08 MED ORDER — IODIXANOL 320 MG/ML IV SOLN
INTRAVENOUS | Status: DC | PRN
  Administered 2024-09-08: 75 mL

## 2024-09-08 MED ORDER — HYDRALAZINE HCL 20 MG/ML IJ SOLN
5.0000 mg | INTRAMUSCULAR | Status: AC | PRN
  Administered 2024-09-09 – 2024-09-10 (×2): 5 mg via INTRAVENOUS
  Filled 2024-09-08 (×2): qty 1

## 2024-09-08 MED ORDER — HEPARIN (PORCINE) IN NACL 1000-0.9 UT/500ML-% IV SOLN
INTRAVENOUS | Status: DC | PRN
  Administered 2024-09-08: 1000 mL

## 2024-09-08 MED ORDER — GABAPENTIN 100 MG PO CAPS
100.0000 mg | ORAL_CAPSULE | Freq: Every day | ORAL | Status: DC
  Administered 2024-09-08 – 2024-09-12 (×5): 100 mg via ORAL
  Filled 2024-09-08 (×5): qty 1

## 2024-09-08 MED ORDER — HEPARIN SODIUM (PORCINE) 1000 UNIT/ML IJ SOLN
INTRAMUSCULAR | Status: AC
  Filled 2024-09-08: qty 10

## 2024-09-08 MED ORDER — LABETALOL HCL 5 MG/ML IV SOLN: 10.0000 mg | INTRAVENOUS | Status: DC | PRN

## 2024-09-08 MED ORDER — HYDROMORPHONE HCL 2 MG PO TABS
2.0000 mg | ORAL_TABLET | ORAL | Status: DC | PRN
  Administered 2024-09-08 – 2024-09-13 (×5): 2 mg via ORAL
  Filled 2024-09-08 (×5): qty 1

## 2024-09-08 MED ORDER — HYDROMORPHONE HCL 2 MG PO TABS: 1.0000 mg | ORAL_TABLET | ORAL | Status: DC | PRN

## 2024-09-08 MED ORDER — GABAPENTIN 100 MG PO CAPS
200.0000 mg | ORAL_CAPSULE | Freq: Every morning | ORAL | Status: DC
  Administered 2024-09-09 – 2024-09-13 (×5): 200 mg via ORAL
  Filled 2024-09-08 (×5): qty 2

## 2024-09-08 NOTE — Progress Notes (Signed)
 [facility] Progress Note   Assessment/ Plan:   Dialysis Orders:  [facility] - MWF [id number] [phone] (home therapies) *Called outpatient center today for HD records to be sent for us  to review*   Last Labs: Hgb 8.7, K 4.9, Ca 8.4, Alb 2.4   Assessment/Plan: Infected left diabetic foot wound - Podiatry and VVS following; on broad ABXs; MRI ordered. Per Podiatry, she may need right foot partial third toe amputation.  Angio today.   ESRD -  on HD MWF. HD today around angio schedule.  Got PD flush yesterday Hypertension/volume  - Euvolemic on exam. BP acceptable Anemia of CKD - Hgb 8.7. Getting records. No IV Fe while on ABXs Secondary Hyperparathyroidism -  Checking phos in AM Nutrition - Renal diet with fluid restriction  Subjective:    Seen in room.  Had PD flush yesterday, went well.  For angio today and dialysis thereafter   Objective:   BP 97/62   Pulse 72   Temp 98.4 F (36.9 C) (Oral)   Resp 14   Ht 5' 5 (1.651 m)   Wt 90 kg   SpO2 100%   BMI 33.02 kg/m   Physical Exam: Gen:NAD CVS: RRR Resp: clear Abd: soft, PD cath in place, slight scabbing around exit site Ext: NO LE edema ACCESS: + L AVF  Labs: BMET Recent Labs  Lab 09/06/24 1750 09/07/24 0609  NA 137 135  K 4.7 4.9  CL 96* 97*  CO2 26 24  GLUCOSE 123* 156*  BUN 30* 38*  CREATININE 5.96* 6.87*  CALCIUM  8.8* 8.4*   CBC Recent Labs  Lab 09/06/24 1750 09/07/24 0609  WBC 13.8* 12.6*  NEUTROABS 10.1* 8.4*  HGB 10.3* 8.7*  HCT 33.5* 28.7*  MCV 97.4 98.0  PLT 392 325      Medications:     aspirin  EC  81 mg Oral Daily   brimonidine  1 drop Both Eyes BID   Chlorhexidine  Gluconate Cloth  6 each Topical Q0600   cinacalcet   90 mg Oral Q breakfast   clopidogrel   75 mg Oral Daily   insulin  aspart  0-6 Units Subcutaneous Q6H   silver  sulfADIAZINE    Topical BID     Almarie Bonine, MD 09/08/2024, 12:26 PM

## 2024-09-08 NOTE — ED Notes (Signed)
 Report called to Cath Lab to [facility]. Patient being transported to Cath Lab at this time.

## 2024-09-08 NOTE — Progress Notes (Signed)
 PODIATRY PROGRESS NOTE Patient Name: Karen [name]  DOB [birth year] DOA 09/06/2024  Hospital Day: 3  Assessment:  48 y.o. 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   WBC 12.6 ESR 114 CRP 13.7  MRI left foot without contrast: Concern for osteomyelitis in the first metatarsal head proximal and distal phalanges, second toe and possibly distal phalanx 3rd and 5th toes. MRI R foot: osteomyelitis distal phalanx R 3rd toe   ABI PVR: Abnormal TBI on the left foot 0.27 noncompressible ABI with dampened waveforms.  Right foot with TBI 0.62 and dampened waveforms  Plan:  -Appreciate vascular surgery, plan for angiogram today.    -Left foot will require transmetatarsal amputation.  Plan now also for Right third toe partial amputation. She is in agreement. Will be Friday AM 0730, NPO p MN prior - Continue IV abx broad spectrum pending further culture data - Anticoagulation: Okay to continue per primary/vascular recommendations - Wound care: Betadine wet-to-dry dressing preop, left foot - WB status: Will be nonweightbearing postoperatively left lower extremity, wbat in post op shoe right foot - Will continue to follow        Karen [name], DPM [facility]    Subjective:  Discussed findings from right foot mri and concern for infection in third toe. Discussed plan for OR Friday am and she is in agreement. Aware of plan for angiogram today  Objective:   Vitals:   09/08/24 0952 09/08/24 1000  BP:  (!) 124/45  Pulse:  71  Resp:  12  Temp: 97.7 F (36.5 C)   SpO2:  97%       Latest Ref Rng & Units 09/07/2024    6:09 AM 09/06/2024    5:50 PM 07/14/2024   11:41 AM  CBC  WBC 4.0 - 10.5 K/uL 12.6  13.8  7.5   Hemoglobin 12.0 - 15.0 g/dL 8.7  89.6  89.2   Hematocrit 36.0 - 46.0 % 28.7  33.5  32.7   Platelets 150 - 400 K/uL 325  392  339         Latest Ref Rng & Units 09/07/2024    6:09 AM 09/06/2024    5:50 PM 06/21/2024   12:47 AM  BMP  Glucose 70 - 99 mg/dL 843  876  887   BUN 6 - 20 mg/dL 38  30  93   Creatinine 0.44 - 1.00 mg/dL 3.12  4.03  0.07   Sodium 135 - 145 mmol/L 135  137  139   Potassium 3.5 - 5.1 mmol/L 4.9  4.7  5.3   Chloride 98 - 111 mmol/L 97  96  100   CO2 22 - 32 mmol/L 24  26  24    Calcium  8.9 - 10.3 mg/dL 8.4  8.8  8.6     General: AAOx3, NAD  Lower Extremity Exam Nonpalpable DP and PT pulse on the left foot.  Weakly palpable DP pulse on the right foot but nonpalpable PT   Right foot with necrotic ulceration unstageable to the distal aspect of the third toe with some erythema and edema distally.  Also there is a superficial nearly healed ulceration on the dorsal aspect of the right forefoot that appears healthy without evidence of infection.   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  There is erythema and edema as well as malodor maceration evidence of wet gangrene in the forefoot.           Radiology:  Results reviewed. See assessment for pertinent imaging results
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
## 2024-09-08 NOTE — ED Notes (Signed)
 EVS called to replace the SHARPS bin.

## 2024-09-08 NOTE — Progress Notes (Signed)
   09/08/24 1826  Vitals  Temp 98.3 F (36.8 C)  Pulse Rate 80  Resp 11  BP 120/62  SpO2 97 %  O2 Device Room Air  Weight 90.5 kg  Type of Weight Post-Dialysis  Post Treatment  Dialyzer Clearance Lightly streaked  Hemodialysis Intake (mL) 0 mL  Liters Processed 75.5  Fluid Removed (mL) 1300 mL  Tolerated HD Treatment Yes  AVG/AVF Arterial Site Held (minutes) 5 minutes  AVG/AVF Venous Site Held (minutes) 5 minutes   Received patient in bed to unit.  Alert and oriented.  Informed consent signed and in chart.   TX duration:3.5  Patient tolerated well.  Transported back to the room  Alert, without acute distress.  Hand-off given to patient's nurse.   Access used: Yes Access issues: No  Total UF removed: 1300 Medication(s) given: See MAR Post HD VS: See Above Grid Post HD weight: 90.5 kg   Zebedee DELENA [facility]
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
## 2024-09-08 NOTE — Progress Notes (Signed)
 Pt receives out-pt HD at [facility], MWF, 0630 chair time. Per clinic, she has been getting her PD flushed but has not started actual PD yet due to scheduling/ staff issues. Informed clinic of pt arrival. Will continue to assist as needed.   [facility] [phone]

## 2024-09-08 NOTE — Op Note (Signed)
    Patient name: Karen [name] MRN: [id number] DOB: 03-25-1976 Sex: female  09/08/2024 Pre-operative Diagnosis: Bilateral lower extremity critical limb ischemia with tissue loss at the toes, left greater than right Post-operative diagnosis:  Same Surgeon:  Fonda FORBES Rim, MD Procedure Performed: 1.  Ultrasound-guided micropuncture access of the right common femoral artery 2.  Aortogram 3.  Second-order cannulation, left lower extremity angiogram 4.  Left popliteal artery drug-coated balloon angioplasty 4 x 60 mm 5.  Moderate sedation time 24 minutes, contrast volume 75 mL 6.  Device assisted closure-Mynx   Indications: Patient is a 48 year old female with history of nonhealing wounds to bilateral lower extremities.  Tissue loss is worse on the left as compared to the right.  I have discussed the risk and benefits of left lower extremity angiogram in effort to define and improve distal perfusion to aid in wound healing, the patient elected to proceed.  Findings:  Aortogram: Patent left renal artery, right renal artery does not fill.  No significant disease in the aortoiliac segments bilaterally.  On the left: Widely patent common femoral, profunda, superficial femoral artery.  There was mild disease in the superficial femoral artery with a 70% focal stenosis in the P1 segment of the popliteal artery.  The remainder the popliteal artery was widely patent.  Two-vessel runoff was anterior tibial artery dominant with the posterior tibial artery also feeling.  The dorsalis pedis provided the majority of flow to the foot.  The pedal arch was not intact.   Procedure:  The patient was identified in the holding area and taken to room 8.  The patient was then placed supine on the table and prepped and draped in the usual sterile fashion.  A time out was called.  Ultrasound was used to evaluate the right common femoral artery.  It was patent .  A digital ultrasound image was acquired.  A  micropuncture needle was used to access the right common femoral artery under ultrasound guidance.  An 018 wire was advanced without resistance and a micropuncture sheath was placed.  The 018 wire was removed and a benson wire was placed.  The micropuncture sheath was exchanged for a 5 french sheath.  An omniflush catheter was advanced over the wire to the level of L-1.  An abdominal angiogram was obtained.  Next, using the omniflush catheter and a benson wire, the aortic bifurcation was crossed and the catheter was placed into theleft external iliac artery and left runoff was obtained.   I elected to attempt intervention on the left popliteal artery.  The patient was heparinized and a 6 x 45 cm sheath was brought into the field and parked in the left common femoral artery.  From this location.  A series of wires and catheters were used to cross the popliteal artery lesion.  Next, a 4 x 60 mm balloon was brought into the field and the lesion angioplastied.  This demonstrated nice result, therefore I elected to use a 4 x 60 mm drug-coated balloon for subsequent angioplasty.  Follow-up angiography demonstrated excellent result with resolution of flow-limiting stenosis.  A Mynx device was used to manage the right-sided arteriotomy without issue.  Impression: Successful drug-coated balloon angioplasty of the left popliteal artery.  Patient has two-vessel runoff to the foot.  The pedal arch is not intact.  She has been maximally revascularized in the left lower extremity.     Fonda FORBES Rim MD [facility] [place] Office: [phone]

## 2024-09-09 ENCOUNTER — Other Ambulatory Visit: Payer: Self-pay

## 2024-09-09 ENCOUNTER — Encounter (HOSPITAL_COMMUNITY): Payer: Self-pay | Admitting: Vascular Surgery

## 2024-09-09 DIAGNOSIS — Z95820 Peripheral vascular angioplasty status with implants and grafts: Secondary | ICD-10-CM | POA: Diagnosis not present

## 2024-09-09 DIAGNOSIS — A419 Sepsis, unspecified organism: Secondary | ICD-10-CM | POA: Diagnosis not present

## 2024-09-09 DIAGNOSIS — R6520 Severe sepsis without septic shock: Secondary | ICD-10-CM | POA: Diagnosis not present

## 2024-09-09 LAB — HEPATITIS B SURFACE ANTIBODY, QUANTITATIVE: Hep B S AB Quant (Post): 88.2 m[IU]/mL

## 2024-09-09 LAB — MISC LABCORP TEST (SEND OUT): Labcorp test code: 6510

## 2024-09-09 LAB — GLUCOSE, CAPILLARY
Glucose-Capillary: 287 mg/dL — ABNORMAL HIGH (ref 70–99)
Glucose-Capillary: 310 mg/dL — ABNORMAL HIGH (ref 70–99)
Glucose-Capillary: 258 mg/dL — ABNORMAL HIGH (ref 70–99)
Glucose-Capillary: 154 mg/dL — ABNORMAL HIGH (ref 70–99)
Glucose-Capillary: 252 mg/dL — ABNORMAL HIGH (ref 70–99)

## 2024-09-09 MED ORDER — ATORVASTATIN CALCIUM 40 MG PO TABS: 40.0000 mg | ORAL_TABLET | Freq: Every day | ORAL | Status: DC

## 2024-09-09 MED ORDER — FENTANYL CITRATE (PF) 50 MCG/ML IJ SOSY: 25.0000 ug | PREFILLED_SYRINGE | INTRAMUSCULAR | Status: DC | PRN

## 2024-09-09 MED ORDER — INSULIN ASPART 100 UNIT/ML IJ SOLN
0.0000 [IU] | INTRAMUSCULAR | Status: DC
  Administered 2024-09-09 (×2): 3 [IU] via SUBCUTANEOUS
  Administered 2024-09-10 (×2): 2 [IU] via SUBCUTANEOUS
  Administered 2024-09-10: 1 [IU] via SUBCUTANEOUS
  Administered 2024-09-10: 4 [IU] via SUBCUTANEOUS
  Administered 2024-09-10: 1 [IU] via SUBCUTANEOUS
  Administered 2024-09-11 (×3): 2 [IU] via SUBCUTANEOUS

## 2024-09-09 MED ORDER — INSULIN GLARGINE 100 UNIT/ML ~~LOC~~ SOLN
5.0000 [IU] | Freq: Every day | SUBCUTANEOUS | Status: DC
  Administered 2024-09-09 – 2024-09-11 (×3): 5 [IU] via SUBCUTANEOUS
  Filled 2024-09-09 (×3): qty 0.05

## 2024-09-09 MED ORDER — VANCOMYCIN HCL IN DEXTROSE 1-5 GM/200ML-% IV SOLN
1000.0000 mg | Freq: Once | INTRAVENOUS | Status: AC
  Administered 2024-09-09: 1000 mg via INTRAVENOUS
  Filled 2024-09-09: qty 200

## 2024-09-09 MED ORDER — METHOCARBAMOL 500 MG PO TABS
750.0000 mg | ORAL_TABLET | Freq: Three times a day (TID) | ORAL | Status: AC | PRN
  Administered 2024-09-09 (×2): 750 mg via ORAL
  Filled 2024-09-09 (×2): qty 2

## 2024-09-09 MED ORDER — VANCOMYCIN HCL IN DEXTROSE 1-5 GM/200ML-% IV SOLN
1000.0000 mg | Freq: Once | INTRAVENOUS | Status: DC
  Filled 2024-09-09: qty 200

## 2024-09-09 MED ORDER — SODIUM CHLORIDE 0.9% FLUSH: 10.0000 mL | INTRAVENOUS | Status: DC | PRN

## 2024-09-09 MED ORDER — SODIUM CHLORIDE 0.9% FLUSH
10.0000 mL | Freq: Two times a day (BID) | INTRAVENOUS | Status: DC
  Administered 2024-09-09 – 2024-09-13 (×9): 10 mL

## 2024-09-09 NOTE — Plan of Care (Signed)

## 2024-09-09 NOTE — Progress Notes (Signed)
 PICC order received. Reviewed with nephrology: OK to place PICC for short-term use per Ronnald Acosta, PA-C.

## 2024-09-09 NOTE — Progress Notes (Addendum)
  Progress Note    09/09/2024 6:41 AM 1 Day Post-Op  Subjective:  sitting up in bed eating breakfast  afebrile  Vitals:   09/08/24 2329 09/09/24 0555  BP: (!) 129/58 (!) 167/67  Pulse: 86 81  Resp: 15 20  Temp: 98.6 F (37 C) 98.6 F (37 C)  SpO2: 95% 98%    Physical Exam: General:  no distress Cardiac:  regular Lungs:  non labored Incisions:  right groin is soft without hematoma Extremities:  brisk doppler flow left DP/peroneal; left foot with gangrenous changes   CBC    Component Value Date/Time   WBC 11.7 (H) 09/08/2024 1510   RBC 2.94 (L) 09/08/2024 1510   HGB 8.7 (L) 09/08/2024 1510   HGB 10.7 (L) 07/14/2024 1141   HCT 27.7 (L) 09/08/2024 1510   HCT 32.7 (L) 07/14/2024 1141   PLT 331 09/08/2024 1510   PLT 339 07/14/2024 1141   MCV 94.2 09/08/2024 1510   MCV 95 07/14/2024 1141   MCH 29.6 09/08/2024 1510   MCHC 31.4 09/08/2024 1510   RDW 13.5 09/08/2024 1510   RDW 13.0 07/14/2024 1141   LYMPHSABS 2.0 09/08/2024 1510   LYMPHSABS 1.6 07/14/2024 1141   MONOABS 0.9 09/08/2024 1510   EOSABS 0.3 09/08/2024 1510   EOSABS 0.3 07/14/2024 1141   BASOSABS 0.1 09/08/2024 1510   BASOSABS 0.1 07/14/2024 1141    BMET    Component Value Date/Time   NA 135 09/07/2024 0609   K 4.9 09/07/2024 0609   CL 97 (L) 09/07/2024 0609   CO2 24 09/07/2024 0609   GLUCOSE 156 (H) 09/07/2024 0609   BUN 38 (H) 09/07/2024 0609   CREATININE 6.87 (H) 09/07/2024 0609   CALCIUM  8.4 (L) 09/07/2024 0609   GFRNONAA 7 (L) 09/07/2024 0609   GFRAA 7 (L) 06/12/2018 0326    INR    Component Value Date/Time   INR 1.1 09/06/2024 1750     Intake/Output Summary (Last 24 hours) at 09/09/2024 0641 Last data filed at 09/08/2024 2330 Gross per 24 hour  Intake 360 ml  Output 1300 ml  Net -940 ml      Assessment/Plan:  48 y.o. female is s/p:  Angiogram with left popliteal artery DCB angioplasty via right CFA 09/09/2024 by Dr. Lanis.  1 Day Post-Op   -pt with brisk doppler flow  left foot.   -plan to OR tomorrow with podiatry -continue asa/plavix .  I do not see that she is on a statin.  Would benefit if primary team feels she is candidate for this.  -she will f/u in our office in 4-6 weeks with LLE arterial duplex and ABI.   Lucie Apt, PA-C [facility] [phone] 09/09/2024 6:41 AM   I have interviewed and examined the patient with PA and agree with assessment and plan above.  Angiography reviewed with excellent result with brisk flow via the AT to the dorsalis pedis and very strong low resistance dorsalis pedis signal on today's exam.  Plan is for left foot partial amputation with podiatry tomorrow.  Based on preoperative noninvasive examination she should have adequate flow to heal intervention of the right lower extremity as well.  She will follow-up in the office as above.  Leightyn Cina C. Sheree, MD [facility] [place] Office: [phone] Pager: [phone]

## 2024-09-09 NOTE — Progress Notes (Addendum)
 [facility] Progress Note   Assessment/ Plan:   Dialysis Orders:  [facility] - MWF  Last Labs: Hgb 8.7, K 4.9, Ca 8.4, Alb 2.4   Assessment/Plan: Osteomyelitis of left forefoot/R distal toe- Osteo on MRI. On IV Vanc/Zosyn . Podiatry and VVS following; s/p angioplasty to L popliteal artery;  To OR tomorrow for TMA.  ESRD -  on HD MWF.  HD 10/10 around OR schedule. PD cath in place. Hasn't started training yet.  Got PD flush 10/7 Hypertension/volume  - Euvolemic on exam. BP acceptable Anemia of CKD - Hgb 8.7. Getting records. No IV Fe while on ABXs Secondary Hyperparathyroidism -  Continue home meds. On Fosrenol  powder; doesn't like chewable (called pharmacy powder not on formulary here)  Nutrition - Renal diet with fluid restriction  Subjective:    Seen in room.  No events overnight. No new complaints. Surgery tomorrow.    Objective:   BP (!) 144/78 (BP Location: Right Arm)   Pulse 81   Temp 98.9 F (37.2 C) (Oral)   Resp 12   Ht 5' 5 (1.651 m)   Wt 90.9 kg   SpO2 98%   BMI 33.35 kg/m   Physical Exam: Gen:NAD CVS: RRR Resp: clear Abd: soft, PD cath in place, slight scabbing around exit site Ext: NO LE edema ACCESS: + L AVF  Labs: BMET Recent Labs  Lab 09/06/24 1750 09/07/24 0609  NA 137 135  K 4.7 4.9  CL 96* 97*  CO2 26 24  GLUCOSE 123* 156*  BUN 30* 38*  CREATININE 5.96* 6.87*  CALCIUM  8.8* 8.4*   CBC Recent Labs  Lab 09/06/24 1750 09/07/24 0609 09/08/24 1510  WBC 13.8* 12.6* 11.7*  NEUTROABS 10.1* 8.4* 8.4*  HGB 10.3* 8.7* 8.7*  HCT 33.5* 28.7* 27.7*  MCV 97.4 98.0 94.2  PLT 392 325 331      Medications:     aspirin  EC  81 mg Oral Daily   atorvastatin   40 mg Oral Daily   brimonidine  1 drop Both Eyes BID   Chlorhexidine  Gluconate Cloth  6 each Topical Q0600   cinacalcet   90 mg Oral Q breakfast   clopidogrel   75 mg Oral Daily   gabapentin   200 mg Oral q AM   And   gabapentin   100 mg Oral QHS   insulin  aspart   0-6 Units Subcutaneous Q6H   sodium chloride  flush  3 mL Intravenous Q12H     Maisie Ronnald Acosta PA-C [facility] 09/09/2024,8:53 AM

## 2024-09-09 NOTE — Inpatient Diabetes Management (Signed)
 Inpatient Diabetes Program Recommendations  AACE/ADA: New Consensus Statement on Inpatient Glycemic Control (2015)  Target Ranges:  Prepandial:   less than 140 mg/dL      Peak postprandial:   less than 180 mg/dL (1-2 hours)      Critically ill patients:  140 - 180 mg/dL   Lab Results  Component Value Date   GLUCAP 310 (H) 09/09/2024   HGBA1C 6.5 (H) 09/06/2024    Review of Glycemic Control  Latest Reference Range & Units 09/08/24 08:16 09/08/24 11:44 09/08/24 13:58 09/08/24 22:41 09/09/24 05:58 09/09/24 12:09  Glucose-Capillary 70 - 99 mg/dL 816 (H) 830 (H) 824 (H) 334 (H) 287 (H) 310 (H)   Diabetes history: DM Outpatient Diabetes medications:  T-Slim insulin  pump: INSULIN  PUMP SETTINGS (Tandem t:slim X2): BASAL RATES: 00:00 0.60  CARB RATIO: 00:00 15  CORRECTION FACTOR: 00:00 50  TARGET BLOOD GLUCOSE: 00:00 110  TOTAL DAILY INSULIN  = 29 units (46% basal; 54% bolus)  Current orders for Inpatient glycemic control:  Novolog  0-6 units q 6 hours  Inpatient Diabetes Program Recommendations:   Note patient wears insulin  pump at home.  Please add Lantus  5 units bid.  Also consider changing Novolog  to q 4 hours since patient is NPO after midnight.  When eating will need Novolog  2-3 units tid with meals.   Thanks,  Randall Bullocks, RN, BC-ADM Inpatient Diabetes Coordinator Pager [phone]  (8a-5p)

## 2024-09-09 NOTE — Progress Notes (Signed)
 Peripherally Inserted Central Catheter Placement  The IV Nurse has discussed with the patient and/or persons authorized to consent for the patient, the purpose of this procedure and the potential benefits and risks involved with this procedure.  The benefits include less needle sticks, lab draws from the catheter, and the patient may be discharged home with the catheter. Risks include, but not limited to, infection, bleeding, blood clot (thrombus formation), and puncture of an artery; nerve damage and irregular heartbeat and possibility to perform a PICC exchange if needed/ordered by physician.  Alternatives to this procedure were also discussed.  Bard Power PICC patient education guide, fact sheet on infection prevention and patient information card has been provided to patient /or left at bedside.    PICC Placement Documentation  PICC Double Lumen 09/09/24 Right Basilic 35 cm 0 cm (Active)  Indication for Insertion or Continuance of Line Limited venous access - need for IV therapy >5 days (PICC only) 09/09/24 1404  Exposed Catheter (cm) 0 cm 09/09/24 1404  Site Assessment Clean, Dry, Intact 09/09/24 1404  Lumen #1 Status Saline locked;Blood return noted 09/09/24 1404  Lumen #2 Status Saline locked;Blood return noted 09/09/24 1404  Dressing Type Transparent;Securing device 09/09/24 1404  Dressing Status Antimicrobial disc/dressing in place;Clean, Dry, Intact 09/09/24 1404  Line Care Connections checked and tightened 09/09/24 1404  Line Adjustment (NICU/IV Team Only) No 09/09/24 1404  Dressing Intervention New dressing 09/09/24 1404  Dressing Change Due 09/16/24 09/09/24 1404       Matthias Merle Chenice 09/09/2024, 2:05 PM

## 2024-09-09 NOTE — Progress Notes (Signed)
 Pharmacy Antibiotic Note  Karen [name] is a 48 y.o. female admitted on 09/06/2024 foot infection, concern for osteo.  Pharmacy has been consulted for vancomycin  dosing.  ESRD-HD MWF  -s/p HD 10/8 but did not get vancomycin   Plan: Vancomycin  1gm x 1 today Monitor HD schedule, Cx and surgical plans Vancomycin  random level as needed  Height: 5' 5 (165.1 cm) Weight: 90.9 kg (200 lb 6.4 oz) IBW/kg (Calculated) : 57  Temp (24hrs), Avg:98.4 F (36.9 C), Min:97.7 F (36.5 C), Max:98.9 F (37.2 C)  Recent Labs  Lab 09/06/24 1750 09/06/24 1757 09/06/24 1941 09/06/24 2324 09/07/24 0609 09/08/24 1510  WBC 13.8*  --   --   --  12.6* 11.7*  CREATININE 5.96*  --   --   --  6.87*  --   LATICACIDVEN  --  1.3 2.0* 0.8  --   --     Estimated Creatinine Clearance: 11.2 mL/min (A) (by C-G formula based on SCr of 6.87 mg/dL (H)).    Allergies  Allergen Reactions   Furosemide Swelling   Heparin  Other (See Comments)    Patient relates vitreous hemorrhage after heparin . Patient relates vitreous hemorrhage after heparin .    Prentice Poisson, PharmD Clinical Pharmacist **Pharmacist phone directory can now be found on [email or web address] ([email or web address]).  Listed under [facility].
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
## 2024-09-09 NOTE — Progress Notes (Signed)
 Responded to consult for IV. Pt is declining IV attempt at this time and is requesting a lidocaine  injection prior to IV placement. Notified RN.

## 2024-09-09 NOTE — Care Management Important Message (Signed)
 Important Message  Patient Details  Name: Carolyn Sylvia MRN: [id number] Date of Birth: [birth year]   Important Message Given:  Yes - Medicare IM     Vonzell Arrie Sharps 09/09/2024, 12:47 PM

## 2024-09-09 NOTE — TOC Initial Note (Signed)
 Transition of Care [facility]) - Initial/Assessment Note    Patient Details  Name: Karen [name] MRN: [id number] Date of Birth: Jul 08, 1976  Transition of Care [facility]) CM/SW Contact:    Sudie Erminio Deems, RN Phone Number: 09/09/2024, 11:43 AM  Clinical Narrative: Patient presented for infected diabetic left foot wound-Initiated on IV Vancomycin . Plan for transmetatarsal amputation 09-10-24.  Patient has a hx ESRD-MWF schedule. PTA patient states she stays with her daughter and her friend that checks in is also her CAP's Aide. Patient receives CAP's 7 days a week 40 hours total. Patient has DME electric wheelchair, manual wheelchair, and rollator. Patient states her friend takes her to HD and PCP appointments. Patient states she has additional support from her sisters. Patient reports that she has no issues paying for medications. Inpatient Case Manager will continue to follow for additional disposition needs.            Expected Discharge Plan: Home w Home Health Services Barriers to Discharge: Continued Medical Work up   Patient Goals and CMS Choice Patient states their goals for this hospitalization and ongoing recovery are:: Plans to return home with family support.          Expected Discharge Plan and Services In-house Referral: NA Discharge Planning Services: CM Consult Post Acute Care Choice: Home Health Living arrangements for the past 2 months: Single Family Home                   DME Agency: NA                  Prior Living Arrangements/Services Living arrangements for the past 2 months: Single Family Home Lives with:: Friends Patient language and need for interpreter reviewed:: Yes Do you feel safe going back to the place where you live?: Yes      Need for Family Participation in Patient Care: Yes (Comment) Care giver support system in place?: Yes (comment) Current home services: Homehealth aide, DME (DME; Electric wheelchair, rollator, Electronics engineer, CAP's worker; 40 hours a week 7 days) Criminal Activity/Legal Involvement Pertinent to Current Situation/Hospitalization: No - Comment as needed  Activities of Daily Living   ADL Screening (condition at time of admission) Independently performs ADLs?: No Does the patient have a NEW difficulty with bathing/dressing/toileting/self-feeding that is expected to last >3 days?: No Does the patient have a NEW difficulty with getting in/out of bed, walking, or climbing stairs that is expected to last >3 days?: No Does the patient have a NEW difficulty with communication that is expected to last >3 days?: No Is the patient deaf or have difficulty hearing?: No Does the patient have difficulty seeing, even when wearing glasses/contacts?: No  Permission Sought/Granted Permission sought to share information with : Family Supports, Case Manager                Emotional Assessment Appearance:: Appears stated age Attitude/Demeanor/Rapport: Engaged Affect (typically observed): Appropriate Orientation: : Oriented to Self, Oriented to Place, Oriented to  Time, Oriented to Situation Alcohol  / Substance Use: Not Applicable Psych Involvement: No (comment)  Admission diagnosis:  End stage renal disease on dialysis (HCC) [N18.6, Z99.2] Cellulitis of left lower extremity [L03.116] Diabetic foot infection (HCC) [Z88.371, L08.9] Patient Active Problem List   Diagnosis Date Noted   Acute osteomyelitis of toe, right (HCC) 09/08/2024   Acute osteomyelitis of left foot (HCC) 09/08/2024   Severe sepsis (HCC) 09/07/2024   Fever 09/07/2024   Leukocytosis 09/07/2024   Pyogenic inflammation of bone (HCC)  09/07/2024   Gangrene of toe of left foot (HCC) 09/07/2024   Cellulitis of left lower extremity 09/06/2024   Severe obesity (BMI 35.0-39.9) with comorbidity (HCC) 05/31/2024   Acquired absence of other right toe(s) 05/04/2024   Acquired absence of lung (part of) 05/04/2024   Chest pain 01/26/2024    NSTEMI (non-ST elevated myocardial infarction) (HCC) 01/26/2024   Elevated troponin 01/26/2024   Lung nodule 08/23/2022   Dependence on renal dialysis 06/21/2022   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) 06/21/2022   Personal history of nicotine dependence 06/21/2022   Long term (current) use of insulin  (HCC) 06/21/2022   Type 2 diabetes mellitus with unspecified diabetic retinopathy without macular edema (HCC) 06/21/2022   OSA (obstructive sleep apnea) 06/13/2022   Adnexal mass 05/14/2022   Wheelchair dependent 01/24/2022   Fibromyalgia 01/24/2022   Incontinence of feces 07/13/2021   Irritable bowel syndrome with diarrhea 07/13/2021   Ambulatory dysfunction 05/28/2021   Debility 05/28/2021   Diabetic neuropathy with neurologic complication (HCC) 05/28/2021   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 05/28/2021   Risk for falls 05/28/2021   Viral pneumonia 11/12/2020   Encounter for removal of sutures 06/29/2019   Diarrhea, unspecified 05/05/2019   Mild intermittent asthma without complication 01/19/2019   Headache, unspecified 12/25/2018   Pruritus, unspecified 11/27/2018   Encounter for immunization 08/19/2018   Chronic low back pain without sciatica 08/13/2018   Mild protein-calorie malnutrition 07/02/2018   Diabetic foot infection (HCC) 06/07/2018   Essential hypertension    Chronic pancreatitis (HCC) 05/12/2018   Vitamin D deficiency 05/12/2018   ESRD on hemodialysis (HCC) 05/07/2018   History of recurrent UTIs 05/07/2018   DM2 (diabetes mellitus, type 2) (HCC) 05/07/2018   Shortness of breath 05/07/2018   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   End stage renal disease on dialysis (HCC) 04/28/2018   History of nephrotic syndrome 04/28/2018   Iron deficiency anemia, unspecified 04/28/2018   Secondary hyperparathyroidism of renal origin 04/28/2018   History of endocarditis 01/16/2018    History of MRSA infection 01/16/2018   Bilateral pleural effusion 10/10/2017   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   Solitary kidney, 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/27/2015   PCP:  Lesa Jon HERO, PA Pharmacy:   [id number] - [place], [place] - [address] AT [facility] [address] [place] [address] Phone: [phone] Fax: [phone]  [id number] - [place], [place] - [address] AT [facility] [address] [place] [address] Phone: [phone] Fax: [phone]     Social Drivers of Health (SDOH) Social History: SDOH Screenings   Food Insecurity: No Food Insecurity (09/07/2024)  Recent Concern: Food Insecurity - Food Insecurity Present (08/25/2024)   Received from [facility]  Housing: Low Risk  (09/07/2024)  Transportation Needs: No Transportation Needs (09/07/2024)  Utilities: Not At Risk (09/07/2024)  Financial Resource Strain: Medium Risk (07/28/2024)   Received from [facility]  Physical Activity: Inactive (07/28/2024)   Received from [facility]  Social Connections: Socially Integrated (07/28/2024)   Received from [facility]  Stress: No Stress Concern Present (08/25/2024)   Received from [facility]  Tobacco Use: Medium Risk (09/07/2024)   SDOH Interventions:     Readmission Risk Interventions     No data to display

## 2024-09-10 ENCOUNTER — Encounter (HOSPITAL_COMMUNITY)
Admission: EM | Disposition: A | Discharge status: Home Health | Payer: Self-pay | Source: Ambulatory Visit | Attending: Internal Medicine

## 2024-09-10 ENCOUNTER — Encounter (HOSPITAL_COMMUNITY): Payer: Self-pay | Admitting: Internal Medicine

## 2024-09-10 ENCOUNTER — Other Ambulatory Visit: Payer: Self-pay

## 2024-09-10 ENCOUNTER — Inpatient Hospital Stay (HOSPITAL_COMMUNITY)

## 2024-09-10 DIAGNOSIS — M869 Osteomyelitis, unspecified: Secondary | ICD-10-CM | POA: Diagnosis not present

## 2024-09-10 DIAGNOSIS — M86171 Other acute osteomyelitis, right ankle and foot: Secondary | ICD-10-CM | POA: Diagnosis not present

## 2024-09-10 DIAGNOSIS — N186 End stage renal disease: Secondary | ICD-10-CM

## 2024-09-10 DIAGNOSIS — I120 Hypertensive chronic kidney disease with stage 5 chronic kidney disease or end stage renal disease: Secondary | ICD-10-CM

## 2024-09-10 DIAGNOSIS — A419 Sepsis, unspecified organism: Secondary | ICD-10-CM | POA: Diagnosis not present

## 2024-09-10 DIAGNOSIS — M86172 Other acute osteomyelitis, left ankle and foot: Secondary | ICD-10-CM | POA: Diagnosis not present

## 2024-09-10 DIAGNOSIS — R6520 Severe sepsis without septic shock: Secondary | ICD-10-CM | POA: Diagnosis not present

## 2024-09-10 DIAGNOSIS — E1169 Type 2 diabetes mellitus with other specified complication: Secondary | ICD-10-CM

## 2024-09-10 HISTORY — PX: TRANSMETATARSAL AMPUTATION: SHX6197

## 2024-09-10 HISTORY — PX: AMPUTATION TOE: SHX6595

## 2024-09-10 LAB — RENAL FUNCTION PANEL
Albumin: 2.2 g/dL — ABNORMAL LOW (ref 3.5–5.0)
Anion gap: 17 — ABNORMAL HIGH (ref 5–15)
BUN: 51 mg/dL — ABNORMAL HIGH (ref 6–20)
CO2: 24 mmol/L (ref 22–32)
Calcium: 8.1 mg/dL — ABNORMAL LOW (ref 8.9–10.3)
Chloride: 91 mmol/L — ABNORMAL LOW (ref 98–111)
Creatinine, Ser: 7.97 mg/dL — ABNORMAL HIGH (ref 0.44–1.00)
GFR, Estimated: 6 mL/min — ABNORMAL LOW (ref >60)
Glucose, Bld: 180 mg/dL — ABNORMAL HIGH (ref 70–99)
Phosphorus: 5.3 mg/dL — ABNORMAL HIGH (ref 2.5–4.6)
Potassium: 4.8 mmol/L (ref 3.5–5.1)
Sodium: 132 mmol/L — ABNORMAL LOW (ref 135–145)

## 2024-09-10 LAB — CBC WITH DIFFERENTIAL/PLATELET
Abs Immature Granulocytes: 0.06 K/uL (ref 0.00–0.07)
Basophils Absolute: 0.1 K/uL (ref 0.0–0.1)
Basophils Relative: 1 %
Eosinophils Absolute: 0.2 K/uL (ref 0.0–0.5)
Eosinophils Relative: 2 %
HCT: 25.2 % — ABNORMAL LOW (ref 36.0–46.0)
Hemoglobin: 8.1 g/dL — ABNORMAL LOW (ref 12.0–15.0)
Immature Granulocytes: 1 %
Lymphocytes Relative: 14 %
Lymphs Abs: 1.5 K/uL (ref 0.7–4.0)
MCH: 29.7 pg (ref 26.0–34.0)
MCHC: 32.1 g/dL (ref 30.0–36.0)
MCV: 92.3 fL (ref 80.0–100.0)
Monocytes Absolute: 1.1 K/uL — ABNORMAL HIGH (ref 0.1–1.0)
Monocytes Relative: 10 %
Neutro Abs: 7.6 K/uL (ref 1.7–7.7)
Neutrophils Relative %: 72 %
Platelets: 309 K/uL (ref 150–400)
RBC: 2.73 MIL/uL — ABNORMAL LOW (ref 3.87–5.11)
RDW: 13.6 % (ref 11.5–15.5)
WBC: 10.3 K/uL (ref 4.0–10.5)
nRBC: 0 % (ref 0.0–0.2)

## 2024-09-10 LAB — IRON AND TIBC
Iron: 19 ug/dL — ABNORMAL LOW (ref 28–170)
Saturation Ratios: 13 % (ref 10.4–31.8)
TIBC: 148 ug/dL — ABNORMAL LOW (ref 250–450)
UIBC: 129 ug/dL

## 2024-09-10 LAB — GLUCOSE, CAPILLARY
Glucose-Capillary: 146 mg/dL — ABNORMAL HIGH (ref 70–99)
Glucose-Capillary: 149 mg/dL — ABNORMAL HIGH (ref 70–99)
Glucose-Capillary: 153 mg/dL — ABNORMAL HIGH (ref 70–99)
Glucose-Capillary: 169 mg/dL — ABNORMAL HIGH (ref 70–99)
Glucose-Capillary: 214 mg/dL — ABNORMAL HIGH (ref 70–99)
Glucose-Capillary: 223 mg/dL — ABNORMAL HIGH (ref 70–99)
Glucose-Capillary: 308 mg/dL — ABNORMAL HIGH (ref 70–99)

## 2024-09-10 LAB — FERRITIN: Ferritin: 1207 ng/mL — ABNORMAL HIGH (ref 11–307)

## 2024-09-10 LAB — VITAMIN B12: Vitamin B-12: 642 pg/mL (ref 180–914)

## 2024-09-10 LAB — FOLATE: Folate: >20 ng/mL (ref >5.9)

## 2024-09-10 SURGERY — AMPUTATION, FOOT, TRANSMETATARSAL
Anesthesia: Monitor Anesthesia Care | Site: Toe | Laterality: Right

## 2024-09-10 MED ORDER — VANCOMYCIN HCL 1000 MG IV SOLR
INTRAVENOUS | Status: AC
  Filled 2024-09-10: qty 20

## 2024-09-10 MED ORDER — LIDOCAINE HCL (PF) 1 % IJ SOLN
INTRAMUSCULAR | Status: AC
  Filled 2024-09-10: qty 30

## 2024-09-10 MED ORDER — SODIUM CHLORIDE 0.9 % IR SOLN
Status: DC | PRN
  Administered 2024-09-10: 1000 mL

## 2024-09-10 MED ORDER — LIDOCAINE HCL (PF) 1 % IJ SOLN: 5.0000 mL | INTRAMUSCULAR | Status: DC | PRN

## 2024-09-10 MED ORDER — LIDOCAINE 2% (20 MG/ML) 5 ML SYRINGE
INTRAMUSCULAR | Status: AC
  Filled 2024-09-10: qty 5

## 2024-09-10 MED ORDER — MIDAZOLAM HCL 2 MG/2ML IJ SOLN
INTRAMUSCULAR | Status: DC | PRN
  Administered 2024-09-10: 1 mg via INTRAVENOUS

## 2024-09-10 MED ORDER — PROPOFOL 10 MG/ML IV BOLUS
INTRAVENOUS | Status: AC
  Filled 2024-09-10: qty 20

## 2024-09-10 MED ORDER — ACETAMINOPHEN 10 MG/ML IV SOLN: 1000.0000 mg | Freq: Once | INTRAVENOUS | Status: DC | PRN

## 2024-09-10 MED ORDER — PENTAFLUOROPROP-TETRAFLUOROETH EX AERO: 1.0000 | INHALATION_SPRAY | CUTANEOUS | Status: DC | PRN

## 2024-09-10 MED ORDER — ALTEPLASE 2 MG IJ SOLR: 2.0000 mg | Freq: Once | INTRAMUSCULAR | Status: DC | PRN

## 2024-09-10 MED ORDER — GENTAMICIN SULFATE 0.1 % EX CREA
1.0000 | TOPICAL_CREAM | Freq: Every day | CUTANEOUS | Status: DC
  Administered 2024-09-11 – 2024-09-13 (×3): 1 via TOPICAL
  Filled 2024-09-10 (×2): qty 15

## 2024-09-10 MED ORDER — CHLORHEXIDINE GLUCONATE 0.12 % MT SOLN
OROMUCOSAL | Status: AC
  Administered 2024-09-10: 15 mL via OROMUCOSAL
  Filled 2024-09-10: qty 15

## 2024-09-10 MED ORDER — TOBRAMYCIN SULFATE 1.2 G IJ SOLR
INTRAMUSCULAR | Status: AC
  Filled 2024-09-10: qty 1.2

## 2024-09-10 MED ORDER — ANTICOAGULANT SODIUM CITRATE 4% (200MG/5ML) IV SOLN: 5.0000 mL | Status: DC | PRN

## 2024-09-10 MED ORDER — FENTANYL CITRATE (PF) 100 MCG/2ML IJ SOLN: 25.0000 ug | INTRAMUSCULAR | Status: DC | PRN

## 2024-09-10 MED ORDER — BUPIVACAINE HCL (PF) 0.5 % IJ SOLN
INTRAMUSCULAR | Status: AC
  Filled 2024-09-10: qty 30

## 2024-09-10 MED ORDER — LIDOCAINE HCL (PF) 1 % IJ SOLN
INTRAMUSCULAR | Status: DC | PRN
  Administered 2024-09-10: 10 mL

## 2024-09-10 MED ORDER — BUPIVACAINE HCL (PF) 0.5 % IJ SOLN
INTRAMUSCULAR | Status: DC | PRN
  Administered 2024-09-10: 10 mL

## 2024-09-10 MED ORDER — LIDOCAINE-PRILOCAINE 2.5-2.5 % EX CREA: 1.0000 | TOPICAL_CREAM | CUTANEOUS | Status: DC | PRN

## 2024-09-10 MED ORDER — ONDANSETRON HCL 4 MG/2ML IJ SOLN
INTRAMUSCULAR | Status: AC
  Filled 2024-09-10: qty 2

## 2024-09-10 MED ORDER — SODIUM CHLORIDE 0.9 % IR SOLN
Status: DC | PRN
Start: 2024-09-10 — End: 2024-09-10
  Administered 2024-09-10: 3000 mL

## 2024-09-10 MED ORDER — CHLORHEXIDINE GLUCONATE 0.12 % MT SOLN: 15.0000 mL | Freq: Once | OROMUCOSAL | Status: AC

## 2024-09-10 MED ORDER — VANCOMYCIN HCL 500 MG IV SOLR: INTRAVENOUS | Status: DC | PRN

## 2024-09-10 MED ORDER — ACETAMINOPHEN 325 MG PO TABS
ORAL_TABLET | ORAL | Status: AC
  Filled 2024-09-10: qty 2

## 2024-09-10 MED ORDER — VANCOMYCIN HCL 500 MG IV SOLR
INTRAVENOUS | Status: AC
  Filled 2024-09-10: qty 10

## 2024-09-10 MED ORDER — INSULIN ASPART 100 UNIT/ML IJ SOLN: 0.0000 [IU] | INTRAMUSCULAR | Status: DC | PRN

## 2024-09-10 MED ORDER — LIDOCAINE 2% (20 MG/ML) 5 ML SYRINGE
INTRAMUSCULAR | Status: DC | PRN
  Administered 2024-09-10: 20 mg via INTRAVENOUS

## 2024-09-10 MED ORDER — FENTANYL CITRATE (PF) 250 MCG/5ML IJ SOLN
INTRAMUSCULAR | Status: AC
  Filled 2024-09-10: qty 5

## 2024-09-10 MED ORDER — PROPOFOL 10 MG/ML IV BOLUS
INTRAVENOUS | Status: DC | PRN
  Administered 2024-09-10: 20 mg via INTRAVENOUS
  Administered 2024-09-10: 30 mg via INTRAVENOUS
  Administered 2024-09-10: 20 mg via INTRAVENOUS

## 2024-09-10 MED ORDER — OXYCODONE HCL 5 MG/5ML PO SOLN: 5.0000 mg | Freq: Once | ORAL | Status: DC | PRN

## 2024-09-10 MED ORDER — FENTANYL CITRATE (PF) 100 MCG/2ML IJ SOLN
INTRAMUSCULAR | Status: DC | PRN
  Administered 2024-09-10: 50 ug via INTRAVENOUS

## 2024-09-10 MED ORDER — ORAL CARE MOUTH RINSE: 15.0000 mL | Freq: Once | OROMUCOSAL | Status: AC

## 2024-09-10 MED ORDER — OXYCODONE HCL 5 MG PO TABS: 5.0000 mg | ORAL_TABLET | Freq: Once | ORAL | Status: DC | PRN

## 2024-09-10 MED ORDER — SODIUM CHLORIDE 0.9 % IV SOLN: INTRAVENOUS | Status: DC | PRN

## 2024-09-10 MED ORDER — MIDAZOLAM HCL 2 MG/2ML IJ SOLN
INTRAMUSCULAR | Status: AC
  Filled 2024-09-10: qty 2

## 2024-09-10 SURGICAL SUPPLY — 43 items
BLADE AVERAGE 25X9 (BLADE) IMPLANT
BLADE SURG 10 STRL SS (BLADE) ×2 IMPLANT
BLADE SURG 15 STRL LF DISP TIS (BLADE) ×2 IMPLANT
BNDG COHESIVE 3X5 TAN ST LF (GAUZE/BANDAGES/DRESSINGS) ×2 IMPLANT
BNDG COMPR ESMARK 4X3 LF (GAUZE/BANDAGES/DRESSINGS) ×2 IMPLANT
BNDG ELASTIC 3INX 5YD STR LF (GAUZE/BANDAGES/DRESSINGS) ×2 IMPLANT
BNDG ELASTIC 4INX 5YD STR LF (GAUZE/BANDAGES/DRESSINGS) IMPLANT
BNDG GAUZE DERMACEA FLUFF 4 (GAUZE/BANDAGES/DRESSINGS) IMPLANT
CHLORAPREP W/TINT 26 (MISCELLANEOUS) IMPLANT
DRAPE BILATERAL LIMB T (DRAPES) IMPLANT
DRAPE IMP U-DRAPE 54X76 (DRAPES) IMPLANT
DRSG ADAPTIC 3X8 NADH LF (GAUZE/BANDAGES/DRESSINGS) IMPLANT
DRSG XEROFORM 1X8 (GAUZE/BANDAGES/DRESSINGS) IMPLANT
ELECTRODE REM PT RTRN 9FT ADLT (ELECTROSURGICAL) ×2 IMPLANT
GAUZE PAD ABD 8X10 STRL (GAUZE/BANDAGES/DRESSINGS) IMPLANT
GAUZE SPONGE 2X2 STRL 8-PLY (GAUZE/BANDAGES/DRESSINGS) IMPLANT
GAUZE SPONGE 4X4 12PLY STRL (GAUZE/BANDAGES/DRESSINGS) ×2 IMPLANT
GAUZE STRETCH 2X75IN STRL (MISCELLANEOUS) ×2 IMPLANT
GAUZE XEROFORM 1X8 LF (GAUZE/BANDAGES/DRESSINGS) ×2 IMPLANT
GLOVE BIO SURGEON STRL SZ7.5 (GLOVE) ×2 IMPLANT
GLOVE BIOGEL PI IND STRL 7.5 (GLOVE) ×2 IMPLANT
GOWN STRL REUS W/ TWL LRG LVL3 (GOWN DISPOSABLE) ×4 IMPLANT
KIT BASIN OR (CUSTOM PROCEDURE TRAY) ×2 IMPLANT
NDL HYPO 25X1 1.5 SAFETY (NEEDLE) ×2 IMPLANT
NEEDLE HYPO 25X1 1.5 SAFETY (NEEDLE) ×2 IMPLANT
PACK ORTHO EXTREMITY (CUSTOM PROCEDURE TRAY) ×2 IMPLANT
PADDING CAST ABS COTTON 4X4 ST (CAST SUPPLIES) ×4 IMPLANT
PENCIL BUTTON HOLSTER BLD 10FT (ELECTRODE) IMPLANT
SET HNDPC FAN SPRY TIP SCT (DISPOSABLE) IMPLANT
SOL .9 NS 3000ML IRR UROMATIC (IV SOLUTION) IMPLANT
SOLN STERILE WATER 1000 ML (IV SOLUTION) ×2 IMPLANT
SOLN STERILE WATER BTL 1000 ML (IV SOLUTION) ×2 IMPLANT
SPIKE FLUID TRANSFER (MISCELLANEOUS) IMPLANT
STAPLER SKIN PROX 35W (STAPLE) IMPLANT
STOCKINETTE 4X48 STRL (DRAPES) IMPLANT
SUT ETHILON 3 0 FSLX (SUTURE) IMPLANT
SUT PROLENE 2 0 FS (SUTURE) IMPLANT
SUT PROLENE 3 0 PS 2 (SUTURE) IMPLANT
SUT PROLENE 4 0 PS 2 18 (SUTURE) IMPLANT
SYR CONTROL 10ML LL (SYRINGE) ×2 IMPLANT
TUBE CONNECTING 12X1/4 (SUCTIONS) IMPLANT
UNDERPAD 30X36 HEAVY ABSORB (UNDERPADS AND DIAPERS) ×2 IMPLANT
YANKAUER SUCT BULB TIP NO VENT (SUCTIONS) IMPLANT

## 2024-09-10 NOTE — Procedures (Signed)
 Received patient in bed to unit.  Alert and oriented.  Informed consent signed and in chart.   TX duration:  Patient tolerated well.  Transported back to the room  Alert, without acute distress.  Hand-off given to patient's nurse.   Access used: Left AVF  Access issues: None  Total UF removed: 2L Medication(s) given: acetaminophen  650mg  Post HD weight:  Post HD VS:   Pt PD catheter flushed during tx.   Jariah Tarkowski S [facility]

## 2024-09-10 NOTE — Progress Notes (Signed)
 Karen [name] Progress Note   Subjective:  Seen in room. Back from OR - had L TMA, R toe amp Feeling ok. Asks about binders - unable to get fosrenol  powder here, which she prefers  Dialysis today.   Objective Vitals:   09/10/24 0657 09/10/24 0835 09/10/24 0845 09/10/24 0849  BP: (!) 140/72 127/60 (!) 108/45 (!) 114/44  Pulse: 80 76 74 73  Resp: 20 12 16 16   Temp: 98.9 F (37.2 C) 98 F (36.7 C)  98 F (36.7 C)  TempSrc: Oral     SpO2: 97% 90% 97% 97%  Weight: 92.6 kg     Height: 5' 5 (1.651 m)        Additional Objective Labs: Basic Metabolic Panel: Recent Labs  Lab 09/06/24 1750 09/07/24 0609 09/10/24 0450  NA 137 135 132*  K 4.7 4.9 4.8  CL 96* 97* 91*  CO2 26 24 24   GLUCOSE 123* 156* 180*  BUN 30* 38* 51*  CREATININE 5.96* 6.87* 7.97*  CALCIUM  8.8* 8.4* 8.1*  PHOS  --   --  5.3*   CBC: Recent Labs  Lab 09/06/24 1750 09/07/24 0609 09/08/24 1510 09/10/24 0450  WBC 13.8* 12.6* 11.7* 10.3  NEUTROABS 10.1* 8.4* 8.4* 7.6  HGB 10.3* 8.7* 8.7* 8.1*  HCT 33.5* 28.7* 27.7* 25.2*  MCV 97.4 98.0 94.2 92.3  PLT 392 325 331 309   Blood Culture    Component Value Date/Time   SDES BLOOD RIGHT ARM 09/06/2024 1633   SPECREQUEST  09/06/2024 1633    BOTTLES DRAWN AEROBIC AND ANAEROBIC Blood Culture adequate volume   CULT  09/06/2024 1633    NO GROWTH 4 DAYS Performed at [facility], [address] [place], [place] [address]    REPTSTATUS PENDING 09/06/2024 1633     Physical Exam General: Alert,nad Heart: RRR Lungs: Clear Abdomen: non -tender Extremities: No LE edema  Dialysis Access: L AVF +bruit   Medications:  piperacillin -tazobactam (ZOSYN )  IV 2.25 g (09/10/24 0018)   vancomycin       aspirin  EC  81 mg Oral Daily   brimonidine  1 drop Both Eyes BID   Chlorhexidine  Gluconate Cloth  6 each Topical Q0600   cinacalcet   90 mg Oral Q breakfast   clopidogrel   75 mg Oral Daily   gabapentin   200 mg Oral q AM   And   gabapentin    100 mg Oral QHS   insulin  aspart  0-6 Units Subcutaneous Q4H   insulin  glargine  5 Units Subcutaneous Daily   sodium chloride  flush  10-40 mL Intracatheter Q12H   sodium chloride  flush  3 mL Intravenous Q12H    Dialysis Orders:  [facility] - MWF 4H BFR 400 EDW 90 kg 2/2.5 AVF No heparin   Last Labs: Hgb 8.7, K 4.9, Ca 8.4, Alb 2.4   Assessment/Plan: Osteomyelitis of left forefoot/R distal toe- Osteo on MRI. On IV Vanc/Zosyn . Podiatry and VVS following; s/p angioplasty to L popliteal artery;  L TMA + R toe amp with podiatry today.  ESRD -  on HD MWF.  HD 10/10. PD cath in place. Hasn't started training yet.  Got PD flush 10/7 Hypertension/volume  - Euvolemic on exam. BP acceptable Anemia of CKD - Hgb 8.1. Will order ESA here.  No IV Fe while on ABXs Secondary Hyperparathyroidism -  Continue home meds. On Fosrenol  powder; doesn't like chewable (called pharmacy powder not on formulary here) doesn't want alternative binder Nutrition- Renal with fluid restriction  Maisie Ronnald Acosta PA-C [name] 09/10/2024,11:17 AM

## 2024-09-10 NOTE — Op Note (Signed)
 Full Operative Report  Date of Operation: 7:36 AM, 09/10/2024   Patient: Karen [name] - 48 y.o. female  Surgeon: Malvin Marsa FALCON, DPM   Assistant: None  Diagnosis: Gangrene and osteomyeleitis of left forefoot, right third toe  Procedure:  1. Amputation of right third toe at PIPJ level 2. Transmetatarsal amputation of left foot    Anesthesia: Anesthesia type not filed in the log.  No responsible provider has been recorded for the case.  No anesthesia staff entered.   Estimated Blood Loss: 20 mL  Hemostasis: 1) Anatomical dissection, mechanical compression, electrocautery 2) No tourniquet was used   Implants: * No implants in log *  Materials: prolene 3-0 right, prolene 2-0 and skin staples left  Injectables: 1) Pre-operatively: 20 cc of 50:50 mixture 1%lidocaine  plain and 0.5% marcaine plain 2) Post-operatively: None   Specimens: - Pathology: Right third toe, left forefoot fot pathology - Microbiology: tissue culture left forefoot    Antibiotics: IV antibiotics given per schedule on the floor  Drains: None  Complications: Patient tolerated the procedure well without complication.   Operative findings: As below in detailed report  Indications for Procedure: Karen [name] presents to [place], [facility] FAL[facility], [place] with a chief complaint of gangrene of the left forefoot with underlying osteomyelitis, also with gangrene and osteomyelitis of distal right third toe The patient has failed conservative treatments of various modalities. At this time the patient has elected to proceed with surgical correction. All alternatives, risks, and complications of the procedures were thoroughly explained to the patient. Patient exhibits appropriate understanding of all discussion points and informed consent was signed and obtained in the chart with no guarantees to surgical outcome given or implied.  Description of Procedure: Patient was brought to the operating  room. Patient remained on their hospital bed in the supine position. A surgical timeout was performed and all members of the operating room, the procedure, and the surgical site were identified. anesthesia occurred as per anesthesia record. Local anesthetic as previously described was then injected about the operative field in a local infiltrative block.  The operative lower extremity as noted above was then prepped and draped in the usual sterile manner. The following procedure then began.  Attention was directed to the third digit on the RIGHT foot. A full-thickness incision encompassing the entire digit was made using a #15 blade. Dissection was carried down to bone. The toe was secured with a towel clamp, further dissected in its entirety, and disarticulated at the PIPJ and passed to the back table as a gross specimen. This was then labled and sent to pathology. The bone was noted to be soft and eroded, and consistent with osteomyelitis. All remaining necrotic and devitalized soft tissue structures were visualized and dissected away using sharp and dull dissection. Care was taken to protect all neurovascular structures throughout the dissection. All bleeders were cauterized as necessary.  The area was then flushed with copious amounts of sterile saline. Then using the suture materials previously described, the site was closed in anatomic layers and the skin was well approximated under minimal tension.  Attention was directed to the LEFT lower extremity. A fish-mouth type incision was made proximal to the web spaces and encompassed the entire forefoot. The full-thickness incision was made with a longer plantar flap to allow for wound closure. The incision was continued through the soft tissue down to the shafts of the metatarsal bones. A blade was then used to free up the periosteum on all the metatarsal shafts. Using  an oscillating saw, the metatarsals were cut in a dorsal distal to plantar proximal  orientation. The first metatarsal was beveled so that the medial cortex was shorter than the lateral, and the fifth metatarsal was beveled so that the lateral cortex was shorter than the medial, thus less prominent. The amputation was done so that a metatarsal parabola was maintained.  The distal portion including all the digits were freed from the metatarsals and soft tissue attachments. The specimen was passed off the field and sent for gross pathology.  A tissue culture was harvested with rongeur from around the 2nd toe base and sent for micro. All remaining non-viable and necrotic tissues were sharply resected and removed. Extensor and flexors tendons were grasped with a hemostat and cut proximally. Good bleeding was seen from the dorsal and plantar tissue flaps.   . The surgical site was then flushed with 1000ml of saline under power pulse lavage. The plantar flap was brought in approximation with the dorsal flap and the sutures material previously described was used for closure. Care was taken not to place the flaps under tension in order not to jeopardize the vascular supply.  The surgical site was then dressed with xeroform 4x4 kerlix and ace, abd pad on left foot as well. The patient tolerated both the procedure and anesthesia well with vital signs stable throughout. The patient was transferred in good condition and all vital signs stable  from the OR to recovery under the discretion of anesthesia.  Condition: Vital signs stable, neurovascular status unchanged from preoperative   Surgical plan:  Expect clean margin bilaterally, recommend 7 days po abx. NWB LLE, will need pt eval. Ok to be WBAT on right foot in post op shoe. Dressings to be changed this weekend by on call dr for our group.   The patient will be WBAT in a post op shoe on right foot, NWB in post op shoe on left foot to the operative limb until further instructed. The dressing is to remain clean, dry, and intact. Will continue to  follow unless noted elsewhere.   Marsa Honour, DPM [facility]

## 2024-09-10 NOTE — Transfer of Care (Signed)
 Immediate Anesthesia Transfer of Care Note  Patient: [facility]  Procedure(s) Performed: AMPUTATION, FOOT, TRANSMETATARSAL (Left: Toe) AMPUTATION, TOE (Right: Toe)  Patient Location: PACU  Anesthesia Type:MAC  Level of Consciousness: awake, alert , and oriented  Airway & Oxygen Therapy: Patient Spontanous Breathing and Patient connected to face mask oxygen  Post-op Assessment: Report given to RN and Post -op Vital signs reviewed and stable  Post vital signs: Reviewed and stable  Last Vitals:  Vitals Value Taken Time  BP 127/60 09/10/24 08:34  Temp    Pulse 76 09/10/24 08:35  Resp 11 09/10/24 08:35  SpO2 96 % 09/10/24 08:35  Vitals shown include unfiled device data.  Last Pain:  Vitals:   09/10/24 0718  TempSrc:   PainSc: 4       Patients Stated Pain Goal: 3 (09/10/24 0718)  Complications: No notable events documented.

## 2024-09-10 NOTE — Progress Notes (Signed)
 History and Physical Interval Note:  09/10/2024 7:12 AM  [facility]  has presented today for surgery, with the diagnosis of gangrene and osteomyelitis of left forefoot and osteomyelitis right distal third toe.  The various methods of treatment have been discussed with the patient and family. After consideration of risks, benefits and other options for treatment, the patient has consented to   Procedure(s) with comments: AMPUTATION, FOOT, TRANSMETATARSAL (Left) - Left foot TMA AMPUTATION, TOE (Right) - Right third toe partial amp as a surgical intervention.  The patient's history has been reviewed, patient examined, no change in status, stable for surgery.  I have reviewed the patient's chart and labs.  Questions were answered to the patient's satisfaction.     Marsa FALCON Brittany Amirault

## 2024-09-10 NOTE — Anesthesia Preprocedure Evaluation (Signed)
 Anesthesia Evaluation  Patient identified by MRN, date of birth, ID band Patient awake    Reviewed: Allergy & Precautions, NPO status , Patient's Chart, lab work & pertinent test results  History of Anesthesia Complications Negative for: history of anesthetic complications  Airway Mallampati: I  TM Distance: >3 FB Neck ROM: Full    Dental  (+) Teeth Intact, Dental Advisory Given   Pulmonary shortness of breath, asthma , sleep apnea , former smoker   breath sounds clear to auscultation       Cardiovascular hypertension, Pt. on medications and Pt. on home beta blockers + Past MI and + Cardiac Stents   Rhythm:Regular  1. Left ventricular ejection fraction, by estimation, is 50 to 55%. The  left ventricle has low normal function. The left ventricle has no regional  wall motion abnormalities. Left ventricular diastolic parameters are  consistent with Grade I diastolic  dysfunction (impaired relaxation). The average left ventricular global  longitudinal strain is -21.6 %. The global longitudinal strain is normal.   2. Right ventricular systolic function is normal. The right ventricular  size is normal.   3. The mitral valve is normal in structure. No evidence of mitral valve  regurgitation. No evidence of mitral stenosis.   4. The aortic valve is tricuspid. There is mild calcification of the  aortic valve. Aortic valve regurgitation is not visualized. No aortic  stenosis is present.   5. The inferior vena cava is normal in size with greater than 50%  respiratory variability, suggesting right atrial pressure of 3 mmHg.      Prox RCA to Mid RCA lesion is 20% stenosed.   Ost Cx to Prox Cx lesion is 20% stenosed.   Dist Cx lesion is 99% stenosed.   Mid LAD lesion is 99% stenosed.   Mid LAD to Dist LAD lesion is 30% stenosed.   A drug-eluting stent was successfully placed using a SYNERGY XD 2.50X24.   Post intervention, there  is a 0% residual stenosis.   Severe, heavily calcified stenosis in the mid LAD.  Successful PTCA/orbital atherectomy/DES x 1 mid LAD Severe stenosis in the small caliber distal Circumflex. Vessel too small for PCI Large dominant RCA with heavy calcification throughout the proximal and mid vessel with mild diffuse stenosis.  LVEDP=18 mmHg   Recommendations: Will continue DAPT with ASA and Brilinta  for one year. High intensity statin. Medical management of the disease in the small caliber distal Circumflex.      Neuro/Psych  Headaches  Neuromuscular disease    GI/Hepatic ,GERD  ,,  Endo/Other  diabetes, Insulin  Dependent    Renal/GU ESRF and DialysisRenal diseaseLab Results      Component                Value               Date                      NA                       132 (L)             09/10/2024                K                        4.8  09/10/2024                CO2                      24                  09/10/2024                GLUCOSE                  180 (H)             09/10/2024                BUN                      51 (H)              09/10/2024                CREATININE               7.97 (H)            09/10/2024                CALCIUM                   8.1 (L)             09/10/2024                GFRNONAA                 6 (L)               09/10/2024                Musculoskeletal  (+) Arthritis ,  Fibromyalgia -  Abdominal   Peds  Hematology  (+) Blood dyscrasia, anemia Lab Results      Component                Value               Date                      WBC                      10.3                09/10/2024                HGB                      8.1 (L)             09/10/2024                HCT                      25.2 (L)            09/10/2024                MCV                      92.3                09/10/2024  PLT                      309                 09/10/2024              Anesthesia Other  Findings   Reproductive/Obstetrics                              Anesthesia Physical Anesthesia Plan  ASA: 3  Anesthesia Plan: MAC   Post-op Pain Management: Minimal or no pain anticipated   Induction:   PONV Risk Score and Plan: 2 and Treatment may vary due to age or medical condition  Airway Management Planned: Nasal Cannula, Natural Airway and Simple Face Mask  Additional Equipment: None  Intra-op Plan:   Post-operative Plan:   Informed Consent: I have reviewed the patients History and Physical, chart, labs and discussed the procedure including the risks, benefits and alternatives for the proposed anesthesia with the patient or authorized representative who has indicated his/her understanding and acceptance.     Dental advisory given  Plan Discussed with: CRNA  Anesthesia Plan Comments:          Anesthesia Quick Evaluation

## 2024-09-10 NOTE — Progress Notes (Signed)
 Patient has PD catheter in place and receiving biweekly flushes. Usually Mon/Fri. Last flush on 10/7. Will plan to flush cathter again today.   Maisie Ronnald Acosta PA-C [facility] 09/10/2024,3:18 PM

## 2024-09-10 NOTE — Plan of Care (Signed)
 Problem: Education: Goal: Ability to describe self-care measures that may prevent or decrease complications (Diabetes Survival Skills Education) will improve 09/10/2024 1447 by Emil Roselie SAUNDERS, RN Outcome: Progressing 09/10/2024 1447 by Emil Roselie SAUNDERS, RN Outcome: Progressing Goal: Individualized Educational Video(s) 09/10/2024 1447 by Emil Roselie SAUNDERS, RN Outcome: Progressing 09/10/2024 1447 by Emil Roselie SAUNDERS, RN Outcome: Progressing   Problem: Coping: Goal: Ability to adjust to condition or change in health will improve 09/10/2024 1447 by Emil Roselie SAUNDERS, RN Outcome: Progressing 09/10/2024 1447 by Emil Roselie SAUNDERS, RN Outcome: Progressing   Problem: Fluid Volume: Goal: Ability to maintain a balanced intake and output will improve 09/10/2024 1447 by Emil Roselie SAUNDERS, RN Outcome: Progressing 09/10/2024 1447 by Emil Roselie SAUNDERS, RN Outcome: Progressing   Problem: Health Behavior/Discharge Planning: Goal: Ability to identify and utilize available resources and services will improve 09/10/2024 1447 by Emil Roselie SAUNDERS, RN Outcome: Progressing 09/10/2024 1447 by Emil Roselie SAUNDERS, RN Outcome: Progressing Goal: Ability to manage health-related needs will improve 09/10/2024 1447 by Emil Roselie SAUNDERS, RN Outcome: Progressing 09/10/2024 1447 by Emil Roselie SAUNDERS, RN Outcome: Progressing   Problem: Metabolic: Goal: Ability to maintain appropriate glucose levels will improve 09/10/2024 1447 by Emil Roselie SAUNDERS, RN Outcome: Progressing 09/10/2024 1447 by Emil Roselie SAUNDERS, RN Outcome: Progressing   Problem: Nutritional: Goal: Maintenance of adequate nutrition will improve 09/10/2024 1447 by Emil Roselie SAUNDERS, RN Outcome: Progressing 09/10/2024 1447 by Emil Roselie SAUNDERS, RN Outcome: Progressing Goal: Progress toward achieving an optimal weight will improve 09/10/2024 1447 by Emil Roselie SAUNDERS, RN Outcome: Progressing 09/10/2024 1447 by  Emil Roselie SAUNDERS, RN Outcome: Progressing   Problem: Skin Integrity: Goal: Risk for impaired skin integrity will decrease 09/10/2024 1447 by Emil Roselie SAUNDERS, RN Outcome: Progressing 09/10/2024 1447 by Emil Roselie SAUNDERS, RN Outcome: Progressing   Problem: Tissue Perfusion: Goal: Adequacy of tissue perfusion will improve 09/10/2024 1447 by Emil Roselie SAUNDERS, RN Outcome: Progressing 09/10/2024 1447 by Emil Roselie SAUNDERS, RN Outcome: Progressing   Problem: Education: Goal: Knowledge of General Education information will improve Description: Including pain rating scale, medication(s)/side effects and non-pharmacologic comfort measures 09/10/2024 1447 by Emil Roselie SAUNDERS, RN Outcome: Progressing 09/10/2024 1447 by Emil Roselie SAUNDERS, RN Outcome: Progressing   Problem: Health Behavior/Discharge Planning: Goal: Ability to manage health-related needs will improve 09/10/2024 1447 by Emil Roselie SAUNDERS, RN Outcome: Progressing 09/10/2024 1447 by Emil Roselie SAUNDERS, RN Outcome: Progressing   Problem: Clinical Measurements: Goal: Ability to maintain clinical measurements within normal limits will improve 09/10/2024 1447 by Emil Roselie SAUNDERS, RN Outcome: Progressing 09/10/2024 1447 by Emil Roselie SAUNDERS, RN Outcome: Progressing Goal: Will remain free from infection 09/10/2024 1447 by Emil Roselie SAUNDERS, RN Outcome: Progressing 09/10/2024 1447 by Emil Roselie SAUNDERS, RN Outcome: Progressing Goal: Diagnostic test results will improve 09/10/2024 1447 by Emil Roselie SAUNDERS, RN Outcome: Progressing 09/10/2024 1447 by Emil Roselie SAUNDERS, RN Outcome: Progressing Goal: Respiratory complications will improve 09/10/2024 1447 by Emil Roselie SAUNDERS, RN Outcome: Progressing 09/10/2024 1447 by Emil Roselie SAUNDERS, RN Outcome: Progressing Goal: Cardiovascular complication will be avoided 09/10/2024 1447 by Emil Roselie SAUNDERS, RN Outcome: Progressing 09/10/2024 1447 by Emil Roselie SAUNDERS, RN Outcome: Progressing   Problem: Activity: Goal: Risk for activity intolerance will decrease 09/10/2024 1447 by Emil Roselie SAUNDERS, RN Outcome: Progressing 09/10/2024 1447 by Emil Roselie SAUNDERS, RN Outcome: Progressing   Problem: Nutrition: Goal: Adequate nutrition will be maintained 09/10/2024 1447 by Emil Roselie SAUNDERS, RN Outcome: Progressing 09/10/2024 1447 by Emil Roselie SAUNDERS, RN Outcome: Progressing  Problem: Coping: Goal: Level of anxiety will decrease 09/10/2024 1447 by Emil Roselie SAUNDERS, RN Outcome: Progressing 09/10/2024 1447 by Emil Roselie SAUNDERS, RN Outcome: Progressing   Problem: Elimination: Goal: Will not experience complications related to bowel motility 09/10/2024 1447 by Emil Roselie SAUNDERS, RN Outcome: Progressing 09/10/2024 1447 by Emil Roselie SAUNDERS, RN Outcome: Progressing Goal: Will not experience complications related to urinary retention 09/10/2024 1447 by Emil Roselie SAUNDERS, RN Outcome: Progressing 09/10/2024 1447 by Emil Roselie SAUNDERS, RN Outcome: Progressing   Problem: Pain Managment: Goal: General experience of comfort will improve and/or be controlled 09/10/2024 1447 by Emil Roselie SAUNDERS, RN Outcome: Progressing 09/10/2024 1447 by Emil Roselie SAUNDERS, RN Outcome: Progressing   Problem: Safety: Goal: Ability to remain free from injury will improve 09/10/2024 1447 by Emil Roselie SAUNDERS, RN Outcome: Progressing 09/10/2024 1447 by Emil Roselie SAUNDERS, RN Outcome: Progressing   Problem: Skin Integrity: Goal: Risk for impaired skin integrity will decrease 09/10/2024 1447 by Emil Roselie SAUNDERS, RN Outcome: Progressing 09/10/2024 1447 by Emil Roselie SAUNDERS, RN Outcome: Progressing   Problem: Education: Goal: Understanding of CV disease, CV risk reduction, and recovery process will improve 09/10/2024 1447 by Emil Roselie SAUNDERS, RN Outcome: Progressing 09/10/2024 1447 by Emil Roselie SAUNDERS, RN Outcome:  Progressing Goal: Individualized Educational Video(s) 09/10/2024 1447 by Emil Roselie SAUNDERS, RN Outcome: Progressing 09/10/2024 1447 by Emil Roselie SAUNDERS, RN Outcome: Progressing   Problem: Activity: Goal: Ability to return to baseline activity level will improve 09/10/2024 1447 by Emil Roselie SAUNDERS, RN Outcome: Progressing 09/10/2024 1447 by Emil Roselie SAUNDERS, RN Outcome: Progressing   Problem: Cardiovascular: Goal: Ability to achieve and maintain adequate cardiovascular perfusion will improve 09/10/2024 1447 by Emil Roselie SAUNDERS, RN Outcome: Progressing 09/10/2024 1447 by Emil Roselie SAUNDERS, RN Outcome: Progressing Goal: Vascular access site(s) Level 0-1 will be maintained 09/10/2024 1447 by Emil Roselie SAUNDERS, RN Outcome: Progressing 09/10/2024 1447 by Emil Roselie SAUNDERS, RN Outcome: Progressing   Problem: Health Behavior/Discharge Planning: Goal: Ability to safely manage health-related needs after discharge will improve 09/10/2024 1447 by Emil Roselie SAUNDERS, RN Outcome: Progressing 09/10/2024 1447 by Emil Roselie SAUNDERS, RN Outcome: Progressing

## 2024-09-10 NOTE — Progress Notes (Signed)
 PROGRESS NOTE    Karen [name]  FMW:[id number] DOB: 1976/07/26 DOA: 09/06/2024 PCP: Lesa Jon HERO, PA  Chief Complaint  Patient presents with   Wound Check    Brief Narrative:  Karen [name] is a 48 y.o. 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   Assessment & Plan:   Principal Problem:   Severe sepsis (HCC) Active Problems:   DM2 (diabetes mellitus, type 2) (HCC)   End stage renal disease on dialysis (HCC)   Cellulitis of left lower extremity   Fever   Leukocytosis   Pyogenic inflammation of bone (HCC)   Gangrene of toe of left foot (HCC)   Acute osteomyelitis of toe, right (HCC)   Acute osteomyelitis of left foot (HCC)   Left Diabetic Foot Infection Sepsis due to L Diabetic Foot Infection/osteomyelitis Bilateral lower extremity critical limb ischemia with tissue loss at the toes, left greater than right Currently afebrile, with leukocytosis MRI with multiple sites of osteomyelitis involving the forefoot (left) Vascular surgery consulted, s/p aortogram, left lower extremity angiogram s/p left popliteal artery drug-coated balloon angioplasty on 10/8 Podiatry on board, s/p left foot transmetatarsal amputation, and right third toe partial amputation on 09/10/2024, recommend 7 days p.o. antibiotics, NWB LLE, okay to WBAT on right foot in postop shoe Continue IV vancomycin , Zosyn , pending surgical wound culture Unable to tolerate statins, follows outpatient [facility] lipid clinic PT/OT  T2DM with neuropathy Last A1c 6.5 on 09/2024 Continue SSI, lantus , Accu-Cheks, hypoglycemic protocol On insulin  pump at home Continue PTA  gabapentin   CAD s/p recent PCI HLD-LDL 136 on 07/2024 S/p PTCA/orbital atherectomy/DESx1 mid LAD 01/2024 - recommended DAPT with aspirin /brilinta  x1 year at that time (medical management of disease in small caliber distal circumflex) - Per recent cardiology note 07/2024, can hold aspirin  and plavix  5-7 days if possible hold aspirin  and continue plavix  will clarify with podiatry whether they'd prefer antiplatelets held (per discussion with Dr. Malvin, ok with continuing) Curbsided cards who noted it was ok to hold 5-7 days if needed, but resume ASP Will continue DAPT for now Patient unable to tolerate statins as it causes her LFTs to be elevated, follows with [facility] lipid clinic, considering her for Repatha/injectables  ESRD  Renal c/s for volume/dialysis management  On HD MWF Plan to initiate PD once able (noted PD catheter in place), defer to outpatient nephrologist   Anemia of chronic kidney disease Normocytic anemia Baseline hemoglobin around 10, currently around 8 Anemia panel showed iron 19, sats 13, ferritin 1207 Daily CBC  Glaucoma Eye drops   Obesity class I Body mass index is 33.97 kg/m.    DVT prophylaxis: SCD, (attributes Franklin heparin  to vitreous haemorrhage) Code Status: full Family Communication: none Disposition:   Status is: Inpatient Remains inpatient appropriate because: need for continued inpatient care   Consultants:  Renal Podiatry vascular  Procedures:  Left lower extremity angiogram on 10/8 Amputation as noted above by podiatry on 10/10  Antimicrobials:  Anti-infectives (From admission, onward)    Start     Dose/Rate Route Frequency Ordered Stop   09/10/24 0727  vancomycin  (VANCOCIN ) powder  Status:  Discontinued          As needed 09/10/24 0728 09/10/24 0831   09/09/24  1515  vancomycin  (VANCOCIN ) IVPB 1000 mg/200 mL premix        1,000 mg 200 mL/hr over 60 Minutes Intravenous  Once 09/09/24 1420 09/09/24 1622   09/09/24 1000  vancomycin   (VANCOCIN ) IVPB 1000 mg/200 mL premix  Status:  Discontinued        1,000 mg 200 mL/hr over 60 Minutes Intravenous  Once 09/09/24 0902 09/09/24 1116   09/08/24 1200  vancomycin  (VANCOCIN ) IVPB 1000 mg/200 mL premix        1,000 mg 200 mL/hr over 60 Minutes Intravenous Every M-W-F (Hemodialysis) 09/06/24 2152     09/06/24 2200  piperacillin -tazobactam (ZOSYN ) IVPB 2.25 g        2.25 g 100 mL/hr over 30 Minutes Intravenous Every 8 hours 09/06/24 2148     09/06/24 2200  vancomycin  (VANCOREADY) IVPB 2000 mg/400 mL        2,000 mg 200 mL/hr over 120 Minutes Intravenous  Once 09/06/24 2152 09/07/24 0059   09/06/24 1900  Ampicillin-Sulbactam (UNASYN) 3 g in sodium chloride  0.9 % 100 mL IVPB       Placed in And Linked Group   3 g 200 mL/hr over 30 Minutes Intravenous  Once 09/06/24 1858 09/06/24 2019   09/06/24 1900  linezolid (ZYVOX) IVPB 600 mg       Placed in And Linked Group   600 mg 300 mL/hr over 60 Minutes Intravenous  Once 09/06/24 1858 09/06/24 2057       Subjective: Saw patient post amputation, denies any new complaints.  Reported having night sweats, noted some low-grade temp, with Tmax of about 100.  Denies any abdominal pain, chest pain, nausea/vomiting.  Objective: Vitals:   09/10/24 1431 09/10/24 1432 09/10/24 1446 09/10/24 1447  BP:  (!) 139/49  (!) 148/52  Pulse: 79 78 81 81  Resp: 19 19 (!) 22 (!) 23  Temp:      TempSrc:      SpO2: 91% 91% 90% 92%  Weight:      Height:        Intake/Output Summary (Last 24 hours) at 09/10/2024 1455 Last data filed at 09/10/2024 0918 Gross per 24 hour  Intake 397 ml  Output --  Net 397 ml   Filed Weights   09/09/24 0555 09/10/24 0448 09/10/24 0657  Weight: 90.9 kg 92.6 kg 92.6 kg    Examination: General: NAD  Cardiovascular: S1, S2 present Respiratory: CTAB Abdomen: Soft, nontender, nondistended, bowel sounds present Musculoskeletal: Noted BLE dressing c/d/I Skin: As noted above Psychiatry: Normal mood      Data Reviewed: I have personally reviewed following labs and imaging studies  CBC: Recent Labs  Lab 09/06/24 1750 09/07/24 0609 09/08/24 1510 09/10/24 0450  WBC 13.8* 12.6* 11.7* 10.3  NEUTROABS 10.1* 8.4* 8.4* 7.6  HGB 10.3* 8.7* 8.7* 8.1*  HCT 33.5* 28.7* 27.7* 25.2*  MCV 97.4 98.0 94.2 92.3  PLT 392 325 331 309    Basic Metabolic Panel: Recent Labs  Lab 09/06/24 1750 09/07/24 0609 09/10/24 0450  NA 137 135 132*  K 4.7 4.9 4.8  CL 96* 97* 91*  CO2 26 24 24   GLUCOSE 123* 156* 180*  BUN 30* 38* 51*  CREATININE 5.96* 6.87* 7.97*  CALCIUM  8.8* 8.4* 8.1*  MG 2.2 2.0  --   PHOS  --   --  5.3*    GFR: Estimated Creatinine Clearance: 9.7 mL/min (A) (by C-G formula based on SCr of 7.97 mg/dL (H)).  Liver Function Tests: Recent Labs  Lab 09/06/24  1750 09/07/24 0609 09/10/24 0450  AST 30 18  --   ALT 14 11  --   ALKPHOS 266* 205*  --   BILITOT 0.6 0.9  --   PROT 7.8 6.3*  --   ALBUMIN  3.1* 2.4* 2.2*    CBG: Recent Labs  Lab 09/10/24 0015 09/10/24 0446 09/10/24 0702 09/10/24 0836 09/10/24 1159  GLUCAP 223* 153* 149* 146* 308*     Recent Results (from the past 240 hours)  Culture, blood (Routine x 2)     Status: None (Preliminary result)   Collection Time: 09/06/24  4:33 PM   Specimen: BLOOD RIGHT ARM  Result Value Ref Range Status   Specimen Description BLOOD RIGHT ARM  Final   Special Requests   Final    BOTTLES DRAWN AEROBIC AND ANAEROBIC Blood Culture adequate volume   Culture   Final    NO GROWTH 4 DAYS Performed at [facility], [address], [place], [place] [address]    Report Status PENDING  Incomplete  Resp panel by RT-PCR (RSV, Flu A&B, Covid) Anterior Nasal Swab     Status: None   Collection Time: 09/06/24  6:54 PM   Specimen: Anterior Nasal Swab  Result Value Ref Range Status   SARS Coronavirus 2 by RT PCR NEGATIVE NEGATIVE Final   Influenza A by PCR NEGATIVE NEGATIVE Final   Influenza B by PCR NEGATIVE NEGATIVE  Final    Comment: (NOTE) The Xpert Xpress SARS-CoV-2/FLU/RSV plus assay is intended as an aid in the diagnosis of influenza from Nasopharyngeal swab specimens and should not be used as a sole basis for treatment. Nasal washings and aspirates are unacceptable for Xpert Xpress SARS-CoV-2/FLU/RSV testing.  Fact Sheet for Patients: BloggerCourse.com  Fact Sheet for Healthcare Providers: SeriousBroker.it  This test is not yet approved or cleared by the [place]  FDA and has been authorized for detection and/or diagnosis of SARS-CoV-2 by FDA under an Emergency Use Authorization (EUA). This EUA will remain in effect (meaning this test can be used) for the duration of the COVID-19 declaration under Section 564(b)(1) of the Act, 21 U.S.C. section 360bbb-3(b)(1), unless the authorization is terminated or revoked.     Resp Syncytial Virus by PCR NEGATIVE NEGATIVE Final    Comment: (NOTE) Fact Sheet for Patients: BloggerCourse.com  Fact Sheet for Healthcare Providers: SeriousBroker.it  This test is not yet approved or cleared by the [place]  FDA and has been authorized for detection and/or diagnosis of SARS-CoV-2 by FDA under an Emergency Use Authorization (EUA). This EUA will remain in effect (meaning this test can be used) for the duration of the COVID-19 declaration under Section 564(b)(1) of the Act, 21 U.S.C. section 360bbb-3(b)(1), unless the authorization is terminated or revoked.  Performed at [facility], [address] [place], [place] [address]          Radiology Studies: DG Foot 2 Views Left Result Date: 09/10/2024 EXAM: 2 VIEW(S) XRAY OF THE LEFT FOOT 09/10/2024 08:59:00 AM COMPARISON: 09/06/2024 CLINICAL HISTORY: [id number] Post-operative state 252351. Table formatting from the original note was not included.; Post-operative state 352351 Post-operative  state 252351. Table formatting from the original note was not included.; Post-operative state [phone] FINDINGS: BONES AND JOINTS: Status post surgical amputation of all 5 rays at mid metatarsal level. SOFT TISSUES: Vascular calcifications are noted. IMPRESSION: 1. Status post amputation of all five toes at the mid metatarsal level. 2. Vascular calcifications. Electronically signed by: Lynwood Seip MD 09/10/2024 09:51 AM EDT RP  Workstation: [id number]   DG Foot 2 Views Right Result Date: 09/10/2024 EXAM: 1 or 2 VIEW(S) XRAY OF THE RIGHT FOOT 09/10/2024 08:59:00 AM COMPARISON: 09/06/2024 CLINICAL HISTORY: Post-operative state 747648. Table formatting from the original note was not included.; Post-operative state [id number] FINDINGS: BONES AND JOINTS: Interval surgical amputation of third middle and distal phalanges. Stable postsurgical changes seen involving fifth metatarsal. Stable chronic findings involving mid foot. No acute fracture. No focal osseous lesion. No joint dislocation. SOFT TISSUES: Vascular calcifications are noted. IMPRESSION: 1. Interval surgical amputation of the third middle and distal phalanges. 2. Stable postsurgical changes involving the fifth metatarsal. 3. Stable chronic changes of the midfoot. 4. Vascular calcifications. Electronically signed by: Lynwood Seip MD 09/10/2024 09:47 AM EDT RP Workstation: [id number]   US  EKG SITE RITE Result Date: 09/09/2024 If Site Rite image not attached, placement could not be confirmed due to current cardiac rhythm.       Scheduled Meds:  aspirin  EC  81 mg Oral Daily   brimonidine  1 drop Both Eyes BID   Chlorhexidine  Gluconate Cloth  6 each Topical Q0600   cinacalcet   90 mg Oral Q breakfast   clopidogrel   75 mg Oral Daily   gabapentin   200 mg Oral q AM   And   gabapentin   100 mg Oral QHS   insulin  aspart  0-6 Units Subcutaneous Q4H   insulin  glargine  5 Units Subcutaneous Daily   sodium chloride  flush  10-40 mL Intracatheter Q12H   sodium  chloride flush  3 mL Intravenous Q12H   Continuous Infusions:  anticoagulant sodium citrate      piperacillin -tazobactam (ZOSYN )  IV 2.25 g (09/10/24 0018)   vancomycin        LOS: 4 days     Lebron JINNY Cage, MD [facility]   To contact the attending provider between 7A-7P or the covering provider during after hours 7P-7A, please log into the web site [email or web address] and access using universal [place] password for that web site. If you do not have the password, please call the hospital operator.  09/10/2024, 2:55 PM

## 2024-09-10 NOTE — Procedures (Signed)
 PD note:  PD catheter was flushed with approximately  500 ml x 3 flushes.  Patient had discomfort with initiation of the first fill.  Speed of flush reduced at that time with good results.  Patient did not have any further issues during fill. Patient had discomfort with the last drain at the beginning of the drain.  The drain speed was lowered by closing the clamp partially and there was no further issue.  Dressing changed with gentamicin cream used.  Site was without signs or symptoms of infection.  No tenderness at site.

## 2024-09-10 NOTE — Procedures (Signed)
 HD Note:  Some information was entered later than the data was gathered due to patient care needs. The stated time with the data is accurate.  Received patient in bed to unit.   Alert and oriented.   Informed consent signed and in chart.   Access used: left arm fistula Access issues: None  Patient tolerated treatment well.  PD catheter flushed, see PD note  TX duration: 3.5 hours  Alert, without acute distress.  Total UF removed: 2000 ml  Hand-off given to patient's nurse.   Transported back to the room   Louann Hopson L. Lenon, RN Kidney Dialysis Unit.

## 2024-09-10 NOTE — Progress Notes (Signed)
 Orthopedic Tech Progress Note Patient Details:  Karen [name] [birth year] [id number]  Ortho Devices Type of Ortho Device: Postop shoe/boot Ortho Device/Splint Location: Bilateral LLE small POS RLE medium POS. Ortho Device/Splint Interventions: Ordered, Application, Adjustment   Post Interventions Patient Tolerated: Well Instructions Provided: Care of device, Adjustment of device  Adine MARLA Blush 09/10/2024, 11:49 AM

## 2024-09-10 NOTE — Plan of Care (Signed)

## 2024-09-11 DIAGNOSIS — R6520 Severe sepsis without septic shock: Secondary | ICD-10-CM | POA: Diagnosis not present

## 2024-09-11 DIAGNOSIS — A419 Sepsis, unspecified organism: Secondary | ICD-10-CM | POA: Diagnosis not present

## 2024-09-11 LAB — CBC WITH DIFFERENTIAL/PLATELET
Abs Immature Granulocytes: 0.03 K/uL (ref 0.00–0.07)
Basophils Absolute: 0.1 K/uL (ref 0.0–0.1)
Basophils Relative: 1 %
Eosinophils Absolute: 0.3 K/uL (ref 0.0–0.5)
Eosinophils Relative: 4 %
HCT: 27.6 % — ABNORMAL LOW (ref 36.0–46.0)
Hemoglobin: 8.7 g/dL — ABNORMAL LOW (ref 12.0–15.0)
Immature Granulocytes: 0 %
Lymphocytes Relative: 28 %
Lymphs Abs: 2 K/uL (ref 0.7–4.0)
MCH: 29.9 pg (ref 26.0–34.0)
MCHC: 31.5 g/dL (ref 30.0–36.0)
MCV: 94.8 fL (ref 80.0–100.0)
Monocytes Absolute: 1 K/uL (ref 0.1–1.0)
Monocytes Relative: 14 %
Neutro Abs: 3.8 K/uL (ref 1.7–7.7)
Neutrophils Relative %: 53 %
Platelets: 332 K/uL (ref 150–400)
RBC: 2.91 MIL/uL — ABNORMAL LOW (ref 3.87–5.11)
RDW: 13.6 % (ref 11.5–15.5)
WBC: 7.1 K/uL (ref 4.0–10.5)
nRBC: 0 % (ref 0.0–0.2)

## 2024-09-11 LAB — RENAL FUNCTION PANEL
Albumin: 2.2 g/dL — ABNORMAL LOW (ref 3.5–5.0)
Anion gap: 13 (ref 5–15)
BUN: 27 mg/dL — ABNORMAL HIGH (ref 6–20)
CO2: 27 mmol/L (ref 22–32)
Calcium: 7.8 mg/dL — ABNORMAL LOW (ref 8.9–10.3)
Chloride: 95 mmol/L — ABNORMAL LOW (ref 98–111)
Creatinine, Ser: 5.18 mg/dL — ABNORMAL HIGH (ref 0.44–1.00)
GFR, Estimated: 10 mL/min — ABNORMAL LOW (ref >60)
Glucose, Bld: 255 mg/dL — ABNORMAL HIGH (ref 70–99)
Phosphorus: 4.5 mg/dL (ref 2.5–4.6)
Potassium: 4.3 mmol/L (ref 3.5–5.1)
Sodium: 135 mmol/L (ref 135–145)

## 2024-09-11 LAB — GLUCOSE, CAPILLARY
Glucose-Capillary: 153 mg/dL — ABNORMAL HIGH (ref 70–99)
Glucose-Capillary: 185 mg/dL — ABNORMAL HIGH (ref 70–99)
Glucose-Capillary: 202 mg/dL — ABNORMAL HIGH (ref 70–99)
Glucose-Capillary: 239 mg/dL — ABNORMAL HIGH (ref 70–99)
Glucose-Capillary: 242 mg/dL — ABNORMAL HIGH (ref 70–99)
Glucose-Capillary: 246 mg/dL — ABNORMAL HIGH (ref 70–99)

## 2024-09-11 LAB — CULTURE, BLOOD (ROUTINE X 2)
Culture: NO GROWTH
Special Requests: ADEQUATE

## 2024-09-11 MED ORDER — PROSOURCE PLUS PO LIQD
30.0000 mL | Freq: Two times a day (BID) | ORAL | Status: DC
  Administered 2024-09-11 – 2024-09-13 (×4): 30 mL via ORAL
  Filled 2024-09-11 (×6): qty 30

## 2024-09-11 MED ORDER — INSULIN GLARGINE 100 UNIT/ML ~~LOC~~ SOLN
5.0000 [IU] | Freq: Two times a day (BID) | SUBCUTANEOUS | Status: DC
  Administered 2024-09-11 – 2024-09-13 (×4): 5 [IU] via SUBCUTANEOUS
  Filled 2024-09-11 (×5): qty 0.05

## 2024-09-11 MED ORDER — INSULIN ASPART 100 UNIT/ML IJ SOLN
0.0000 [IU] | Freq: Three times a day (TID) | INTRAMUSCULAR | Status: DC
  Administered 2024-09-11 (×2): 1 [IU] via SUBCUTANEOUS
  Administered 2024-09-12: 4 [IU] via SUBCUTANEOUS

## 2024-09-11 MED ORDER — INSULIN ASPART 100 UNIT/ML IJ SOLN
2.0000 [IU] | Freq: Three times a day (TID) | INTRAMUSCULAR | Status: DC
  Administered 2024-09-11 (×2): 2 [IU] via SUBCUTANEOUS

## 2024-09-11 MED ORDER — DARBEPOETIN ALFA 60 MCG/0.3ML IJ SOSY: 60.0000 ug | PREFILLED_SYRINGE | INTRAMUSCULAR | Status: DC

## 2024-09-11 MED ORDER — METHOCARBAMOL 500 MG PO TABS
750.0000 mg | ORAL_TABLET | Freq: Three times a day (TID) | ORAL | Status: DC | PRN
Start: 2024-09-11 — End: 2024-09-14
  Administered 2024-09-11 – 2024-09-13 (×5): 750 mg via ORAL
  Filled 2024-09-11 (×6): qty 2

## 2024-09-11 NOTE — Plan of Care (Signed)

## 2024-09-11 NOTE — Evaluation (Signed)
 Physical Therapy Evaluation Patient Details Name: Karen [name] MRN: [id number] DOB: 1976-10-15 Today's Date: 09/11/2024  History of Present Illness  Karen [name] is a 48 y.o. female admitted on 09/06/24 with infected left diabetic foot wound. Now s/p left foot transmetatarsal amputation, and right third toe partial amputation on 09/10/2024, recommend 7 days p.o. antibiotics, NWB LLE, okay to WBAT on right foot in postop shoe. 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 Patient presents today with updated WB status, NWB LLE and WBAT R LE. Patient is limited by impaired activity tolerance, B LE weakness, chronic sensation deficits, standing balance deficits, and impaired functional mobility.   Patient is CGA for stand pivot transfers using 2WW. Extensive discussion with patient on stair navigation as patient does not have bilateral rails and had difficulty completing stairs before NWB status. She plans to return to sister's home until a ramp has been built for use. Sister's home is one level and w/c accessible in the bathroom. Patient is clear to d/c to sister's home at wheelchair level with use of 2WW for transfers and 24/7 supervision/assist. Will continue to follow acutely until patient's discharges.       If plan is discharge home, recommend the following: A little help with walking and/or transfers;A little help with bathing/dressing/bathroom;Help with stairs or ramp for entrance;Assist for transportation;Assistance with cooking/housework   Can travel by private Tax inspector (2 wheels)  Recommendations for Other Services       Functional Status Assessment Patient has had a  recent decline in their functional status and demonstrates the ability to make significant improvements in function in a reasonable and predictable amount of time.     Precautions / Restrictions Precautions Precautions: Fall Recall of Precautions/Restrictions: Intact Restrictions Weight Bearing Restrictions Per Provider Order: Yes RLE Weight Bearing Per Provider Order: Weight bearing as tolerated LLE Weight Bearing Per Provider Order: Non weight bearing Other Position/Activity Restrictions: WBAT R foot with post op shoe, NWB LLE with post op shoe      Mobility  Bed Mobility Overal bed mobility: Modified Independent Bed Mobility: Supine to Sit     Supine to sit: Modified independent (Device/Increase time)          Transfers Overall transfer level: Needs assistance Equipment used: Rolling walker (2 wheels) Transfers: Sit to/from Stand Sit to Stand: Contact guard assist, Supervision           General transfer comment: initially CGA with progression to supervision. Cues for hand placement. Mild instability. Able to maintain NWB.    Ambulation/Gait Ambulation/Gait assistance: Min assist Gait Distance (Feet): 2 Feet Assistive device: Rolling walker (2 wheels)         General Gait Details: hop to gait pattern, cues for sequencing, able to maintain L LE NWB, poor tolerance  Stairs Stairs:  (Discussed with patient navigating stairs NWB, necessity of two railings however patient limited in hopping. Also discussed bumping up stairs backwards in w/c with +2 assist, patient will not have this assistance. She is in process of getting ramp.)          Wheelchair Mobility     Tilt Bed    Modified Rankin (Stroke Patients Only)       Balance  Overall balance assessment: Needs assistance Sitting-balance support: No upper extremity supported Sitting balance-Leahy Scale: Good     Standing balance support: Reliant on assistive device for balance, Bilateral upper  extremity supported Standing balance-Leahy Scale: Poor Standing balance comment: Assistance of device secondary to L LE NWB. Initially requiring minA for stability and walker stabilization                             Pertinent Vitals/Pain Pain Assessment Pain Assessment: 0-10 Pain Score: 4  Pain Location: back Pain Descriptors / Indicators: Aching, Sharp Pain Intervention(s): Monitored during session, Premedicated before session    Home Living Family/patient expects to be discharged to:: Private residence Living Arrangements: Non-relatives/Friends Available Help at Discharge: Friend(s) Type of Home: House Home Access: Stairs to enter Entrance Stairs-Rails: Right;Left (cannot reach both) Entrance Stairs-Number of Steps: 3   Home Layout: One level Home Equipment: Rollator (4 wheels);Wheelchair - manual;Shower seat;Hand held shower head      Prior Function Prior Level of Function : Needs assist             Mobility Comments: Pt. independent with ambulation short household distances using 4WW, would use w/c for community distances. Most of the time would need minA for STS from commode. She best friend that she has moved in with is her caregiver. Poor ability to ascend/descend stairs prior to admission.       Extremity/Trunk Assessment        Lower Extremity Assessment Lower Extremity Assessment: RLE deficits/detail;LLE deficits/detail RLE Deficits / Details: grossly 3/5 MMT RLE Sensation: decreased light touch LLE Deficits / Details: grossly 3/5 MMT LLE Sensation: decreased light touch       Communication   Communication Communication: No apparent difficulties    Cognition Arousal: Alert Behavior During Therapy: WFL for tasks assessed/performed   PT - Cognitive impairments: No apparent impairments                         Following commands: Intact       Cueing       General Comments General comments (skin integrity, edema, etc.):  Education provided to patient on safe mobility upon discharge. Patient agreed that discharge to sister's home at this time would be safest to avoid stairs until her ramp has been built. Discussed taking w/c into bathroom, completing stand pivot transfers with 2WW and caregiver assist.    Exercises     Assessment/Plan    PT Assessment Patient needs continued PT services  PT Problem List Decreased strength;Decreased balance;Decreased activity tolerance;Decreased mobility;Decreased knowledge of use of DME       PT Treatment Interventions DME instruction;Gait training;Stair training;Functional mobility training;Therapeutic activities;Therapeutic exercise;Balance training;Neuromuscular re-education;Patient/family education    PT Goals (Current goals can be found in the Care Plan section)  Acute Rehab PT Goals Patient Stated Goal: to return home as soon as possible PT Goal Formulation: With patient Time For Goal Achievement: 09/25/24 Potential to Achieve Goals: Good    Frequency Min 2X/week     Co-evaluation               AM-PAC PT 6 Clicks Mobility  Outcome Measure Help needed turning from your back to your side while in a flat bed without using bedrails?: None Help needed moving from lying on your back to sitting on the side of a flat bed without using bedrails?: None Help needed moving to and from a  bed to a chair (including a wheelchair)?: A Little Help needed standing up from a chair using your arms (e.g., wheelchair or bedside chair)?: A Little Help needed to walk in hospital room?: A Lot Help needed climbing 3-5 steps with a railing? : Total 6 Click Score: 17    End of Session Equipment Utilized During Treatment: Gait belt;Other (comment) (bilateral post op shoes) Activity Tolerance: Patient tolerated treatment well Patient left: in bed;with bed alarm set;with call bell/phone within reach Nurse Communication: Mobility status;Other (comment) (discharge  recommendations) PT Visit Diagnosis: Unsteadiness on feet (R26.81);Other abnormalities of gait and mobility (R26.89);Muscle weakness (generalized) (M62.81);Difficulty in walking, not elsewhere classified (R26.2)    Time: 8487-8446 PT Time Calculation (min) (ACUTE ONLY): 41 min   Charges:   PT Evaluation $PT Eval Moderate Complexity: 1 Mod PT Treatments $Therapeutic Activity: 23-37 mins PT General Charges $$ ACUTE PT VISIT: 1 Visit        Sherryle [place], PT, DPT [facility] Acute Rehabilitation Office: [phone]   Sherryle VEAR Weskan 09/11/2024, 4:55 PM

## 2024-09-11 NOTE — Progress Notes (Signed)
    Durable Medical Equipment  (From admission, onward)           Start     Ordered   09/11/24 1613  For home use only DME Walker rolling  Once       Question Answer Comment  Walker: With 5 Inch Wheels   Patient needs a walker to treat with the following condition Diabetic ulcer of left foot (HCC)      09/11/24 1612

## 2024-09-11 NOTE — Progress Notes (Signed)
 PROGRESS NOTE    Karen [name]  FMW:[id number] DOB: 11/20/1976 DOA: 09/06/2024 PCP: Lesa Jon HERO, PA  Chief Complaint  Patient presents with   Wound Check    Brief Narrative:  Karen [name] is a 48 y.o. 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   Assessment & Plan:   Principal Problem:   Severe sepsis (HCC) Active Problems:   DM2 (diabetes mellitus, type 2) (HCC)   End stage renal disease on dialysis (HCC)   Cellulitis of left lower extremity   Fever   Leukocytosis   Pyogenic inflammation of bone (HCC)   Gangrene of toe of left foot (HCC)   Acute osteomyelitis of toe, right (HCC)   Acute osteomyelitis of left foot (HCC)   Left Diabetic Foot Infection Sepsis due to L Diabetic Foot Infection/osteomyelitis Bilateral lower extremity critical limb ischemia with tissue loss at the toes, left greater than right Currently afebrile, with leukocytosis MRI with multiple sites of osteomyelitis involving the forefoot (left) Vascular surgery consulted, s/p aortogram, left lower extremity angiogram s/p left popliteal artery drug-coated balloon angioplasty on 10/8 Podiatry on board, s/p left foot transmetatarsal amputation, and right third toe partial amputation on 09/10/2024, recommend 7 days p.o. antibiotics, NWB LLE, okay to WBAT on right foot in postop shoe Surgical wound culture growing abundant Morganella morganii, Proteus mirabilis, awaiting final report Continue IV vancomycin , Zosyn , pending surgical wound culture Unable to tolerate statins, follows outpatient [facility] lipid clinic PT/OT  T2DM with neuropathy Last A1c 6.5 on  09/2024 Continue SSI, lantus , Accu-Cheks, hypoglycemic protocol On insulin  pump at home Continue PTA gabapentin   CAD s/p recent PCI HLD-LDL 136 on 07/2024 S/p PTCA/orbital atherectomy/DESx1 mid LAD 01/2024 - recommended DAPT with aspirin /brilinta  x1 year at that time (medical management of disease in small caliber distal circumflex) - Per recent cardiology note 07/2024, can hold aspirin  and plavix  5-7 days if possible hold aspirin  and continue plavix  will clarify with podiatry whether they'd prefer antiplatelets held (per discussion with Dr. Malvin, ok with continuing) Curbsided cards who noted it was ok to hold 5-7 days if needed, but resume ASP Will continue DAPT for now Patient unable to tolerate statins as it causes her LFTs to be elevated, follows with [facility] lipid clinic, considering her for Repatha/injectables  ESRD  Renal c/s for volume/dialysis management  On HD MWF Plan to initiate PD once able (noted PD catheter in place), defer to outpatient nephrologist   Anemia of chronic kidney disease Normocytic anemia Baseline hemoglobin around 10, currently around 8 Anemia panel showed iron 19, sats 13, ferritin 1207 Daily CBC  Glaucoma Eye drops   Obesity class I Body mass index is 32.91 kg/m.    DVT prophylaxis: SCD, (attributes Alexander heparin  to vitreous haemorrhage) Code Status: full Family Communication: none Disposition:   Status is: Inpatient Remains inpatient appropriate because: need for continued inpatient care   Consultants:  Renal Podiatry vascular  Procedures:  Left lower extremity angiogram on 10/8 Amputation as noted above by podiatry on 10/10  Antimicrobials:  Anti-infectives (From admission, onward)    Start     Dose/Rate Route Frequency Ordered Stop   09/10/24 0727  vancomycin  (VANCOCIN ) powder  Status:  Discontinued  As needed 09/10/24 0728 09/10/24 0831   09/09/24 1515  vancomycin  (VANCOCIN ) IVPB 1000 mg/200 mL premix        1,000  mg 200 mL/hr over 60 Minutes Intravenous  Once 09/09/24 1420 09/09/24 1622   09/09/24 1000  vancomycin  (VANCOCIN ) IVPB 1000 mg/200 mL premix  Status:  Discontinued        1,000 mg 200 mL/hr over 60 Minutes Intravenous  Once 09/09/24 0902 09/09/24 1116   09/08/24 1200  vancomycin  (VANCOCIN ) IVPB 1000 mg/200 mL premix        1,000 mg 200 mL/hr over 60 Minutes Intravenous Every M-W-F (Hemodialysis) 09/06/24 2152     09/06/24 2200  piperacillin -tazobactam (ZOSYN ) IVPB 2.25 g        2.25 g 100 mL/hr over 30 Minutes Intravenous Every 8 hours 09/06/24 2148     09/06/24 2200  vancomycin  (VANCOREADY) IVPB 2000 mg/400 mL        2,000 mg 200 mL/hr over 120 Minutes Intravenous  Once 09/06/24 2152 09/07/24 0059   09/06/24 1900  Ampicillin-Sulbactam (UNASYN) 3 g in sodium chloride  0.9 % 100 mL IVPB       Placed in And Linked Group   3 g 200 mL/hr over 30 Minutes Intravenous  Once 09/06/24 1858 09/06/24 2019   09/06/24 1900  linezolid (ZYVOX) IVPB 600 mg       Placed in And Linked Group   600 mg 300 mL/hr over 60 Minutes Intravenous  Once 09/06/24 1858 09/06/24 2057       Subjective: Denies any new complaints today, continues to report night sweats, possibly from ongoing infection.   Objective: Vitals:   09/11/24 0002 09/11/24 0441 09/11/24 0829 09/11/24 1253  BP: (!) 149/66 (!) 144/88 (!) 108/58 137/65  Pulse: 78 85 76   Resp: 15 16 18 17   Temp: 98.6 F (37 C) 98.6 F (37 C) 99.6 F (37.6 C) 99 F (37.2 C)  TempSrc: Oral Oral Rectal Oral  SpO2: 98% 98%    Weight:  89.7 kg    Height:        Intake/Output Summary (Last 24 hours) at 09/11/2024 1607 Last data filed at 09/10/2024 1732 Gross per 24 hour  Intake --  Output 2000 ml  Net -2000 ml   Filed Weights   09/10/24 0448 09/10/24 0657 09/11/24 0441  Weight: 92.6 kg 92.6 kg 89.7 kg    Examination: General: NAD  Cardiovascular: S1, S2 present Respiratory: CTAB Abdomen: Soft, nontender, nondistended, bowel sounds  present Musculoskeletal: Noted BLE dressing c/d/I Skin: As noted above Psychiatry: Normal mood     Data Reviewed: I have personally reviewed following labs and imaging studies  CBC: Recent Labs  Lab 09/06/24 1750 09/07/24 0609 09/08/24 1510 09/10/24 0450 09/11/24 0500  WBC 13.8* 12.6* 11.7* 10.3 7.1  NEUTROABS 10.1* 8.4* 8.4* 7.6 3.8  HGB 10.3* 8.7* 8.7* 8.1* 8.7*  HCT 33.5* 28.7* 27.7* 25.2* 27.6*  MCV 97.4 98.0 94.2 92.3 94.8  PLT 392 325 331 309 332    Basic Metabolic Panel: Recent Labs  Lab 09/06/24 1750 09/07/24 0609 09/10/24 0450 09/11/24 0500  NA 137 135 132* 135  K 4.7 4.9 4.8 4.3  CL 96* 97* 91* 95*  CO2 26 24 24 27   GLUCOSE 123* 156* 180* 255*  BUN 30* 38* 51* 27*  CREATININE 5.96* 6.87* 7.97* 5.18*  CALCIUM  8.8* 8.4* 8.1* 7.8*  MG 2.2 2.0  --   --   PHOS  --   --  5.3* 4.5  GFR: Estimated Creatinine Clearance: 14.7 mL/min (A) (by C-G formula based on SCr of 5.18 mg/dL (H)).  Liver Function Tests: Recent Labs  Lab 09/06/24 1750 09/07/24 0609 09/10/24 0450 09/11/24 0500  AST 30 18  --   --   ALT 14 11  --   --   ALKPHOS 266* 205*  --   --   BILITOT 0.6 0.9  --   --   PROT 7.8 6.3*  --   --   ALBUMIN  3.1* 2.4* 2.2* 2.2*    CBG: Recent Labs  Lab 09/10/24 2056 09/11/24 0004 09/11/24 0439 09/11/24 0742 09/11/24 1253  GLUCAP 214* 246* 242* 202* 185*     Recent Results (from the past 240 hours)  Culture, blood (Routine x 2)     Status: None   Collection Time: 09/06/24  4:33 PM   Specimen: BLOOD RIGHT ARM  Result Value Ref Range Status   Specimen Description BLOOD RIGHT ARM  Final   Special Requests   Final    BOTTLES DRAWN AEROBIC AND ANAEROBIC Blood Culture adequate volume   Culture   Final    NO GROWTH 5 DAYS Performed at [facility], [address], [place], [place] [address]    Report Status 09/11/2024 FINAL  Final  Resp panel by RT-PCR (RSV, Flu A&B, Covid) Anterior Nasal Swab     Status: None   Collection Time:  09/06/24  6:54 PM   Specimen: Anterior Nasal Swab  Result Value Ref Range Status   SARS Coronavirus 2 by RT PCR NEGATIVE NEGATIVE Final   Influenza A by PCR NEGATIVE NEGATIVE Final   Influenza B by PCR NEGATIVE NEGATIVE Final    Comment: (NOTE) The Xpert Xpress SARS-CoV-2/FLU/RSV plus assay is intended as an aid in the diagnosis of influenza from Nasopharyngeal swab specimens and should not be used as a sole basis for treatment. Nasal washings and aspirates are unacceptable for Xpert Xpress SARS-CoV-2/FLU/RSV testing.  Fact Sheet for Patients: BloggerCourse.com  Fact Sheet for Healthcare Providers: SeriousBroker.it  This test is not yet approved or cleared by the [place]  FDA and has been authorized for detection and/or diagnosis of SARS-CoV-2 by FDA under an Emergency Use Authorization (EUA). This EUA will remain in effect (meaning this test can be used) for the duration of the COVID-19 declaration under Section 564(b)(1) of the Act, 21 U.S.C. section 360bbb-3(b)(1), unless the authorization is terminated or revoked.     Resp Syncytial Virus by PCR NEGATIVE NEGATIVE Final    Comment: (NOTE) Fact Sheet for Patients: BloggerCourse.com  Fact Sheet for Healthcare Providers: SeriousBroker.it  This test is not yet approved or cleared by the [place]  FDA and has been authorized for detection and/or diagnosis of SARS-CoV-2 by FDA under an Emergency Use Authorization (EUA). This EUA will remain in effect (meaning this test can be used) for the duration of the COVID-19 declaration under Section 564(b)(1) of the Act, 21 U.S.C. section 360bbb-3(b)(1), unless the authorization is terminated or revoked.  Performed at [facility], [address] [place], [place] [address]   Aerobic/Anaerobic Culture w Gram Stain (surgical/deep wound)     Status: None (Preliminary  result)   Collection Time: 09/10/24  8:00 AM   Specimen: Foot, Left; Tissue  Result Value Ref Range Status   Specimen Description TISSUE  Final   Special Requests LEFT FOREFOOT  Final   Gram Stain NO WBC SEEN FEW GRAM POSITIVE COCCI IN PAIRS   Final   Culture  Final    ABUNDANT MORGANELLA MORGANII ABUNDANT PROTEUS MIRABILIS CULTURE REINCUBATED FOR BETTER GROWTH Performed at [facility], [address], [place], [place] [address]    Report Status PENDING  Incomplete         Radiology Studies: DG Foot 2 Views Left Result Date: 09/10/2024 EXAM: 2 VIEW(S) XRAY OF THE LEFT FOOT 09/10/2024 08:59:00 AM COMPARISON: 09/06/2024 CLINICAL HISTORY: [id number] Post-operative state 252351. Table formatting from the original note was not included.; Post-operative state 352351 Post-operative state 252351. Table formatting from the original note was not included.; Post-operative state [id number] FINDINGS: BONES AND JOINTS: Status post surgical amputation of all 5 rays at mid metatarsal level. SOFT TISSUES: Vascular calcifications are noted. IMPRESSION: 1. Status post amputation of all five toes at the mid metatarsal level. 2. Vascular calcifications. Electronically signed by: Lynwood Seip MD 09/10/2024 09:51 AM EDT RP Workstation: [id number]   DG Foot 2 Views Right Result Date: 09/10/2024 EXAM: 1 or 2 VIEW(S) XRAY OF THE RIGHT FOOT 09/10/2024 08:59:00 AM COMPARISON: 09/06/2024 CLINICAL HISTORY: Post-operative state 747648. Table formatting from the original note was not included.; Post-operative state [phone] FINDINGS: BONES AND JOINTS: Interval surgical amputation of third middle and distal phalanges. Stable postsurgical changes seen involving fifth metatarsal. Stable chronic findings involving mid foot. No acute fracture. No focal osseous lesion. No joint dislocation. SOFT TISSUES: Vascular calcifications are noted. IMPRESSION: 1. Interval surgical amputation of the third middle and distal phalanges. 2.  Stable postsurgical changes involving the fifth metatarsal. 3. Stable chronic changes of the midfoot. 4. Vascular calcifications. Electronically signed by: Lynwood Seip MD 09/10/2024 09:47 AM EDT RP Workstation: [id number]        Scheduled Meds:  (feeding supplement) PROSource Plus  30 mL Oral BID BM   aspirin  EC  81 mg Oral Daily   brimonidine  1 drop Both Eyes BID   Chlorhexidine  Gluconate Cloth  6 each Topical Q0600   cinacalcet   90 mg Oral Q breakfast   clopidogrel   75 mg Oral Daily   [START ON 09/17/2024] darbepoetin (ARANESP ) injection - DIALYSIS  60 mcg Subcutaneous Q Fri   gabapentin   200 mg Oral q AM   And   gabapentin   100 mg Oral QHS   gentamicin cream  1 Application Topical Daily   insulin  aspart  0-6 Units Subcutaneous TID WC   insulin  aspart  2 Units Subcutaneous TID WC   insulin  glargine  5 Units Subcutaneous BID   sodium chloride  flush  10-40 mL Intracatheter Q12H   sodium chloride  flush  3 mL Intravenous Q12H   Continuous Infusions:  piperacillin -tazobactam (ZOSYN )  IV 2.25 g (09/11/24 0914)   vancomycin  1,000 mg (09/10/24 1836)     LOS: 5 days     Lebron JINNY Cage, MD [facility]   To contact the attending provider between 7A-7P or the covering provider during after hours 7P-7A, please log into the web site [email or web address] and access using universal [place] password for that web site. If you do not have the password, please call the hospital operator.  09/11/2024, 4:07 PM

## 2024-09-11 NOTE — Progress Notes (Addendum)
 PT is recommending a RW. Pt doesn't have a preference for a DME agency. [facility] is in network with pt's insurance for DME. Pt agrees to use [facility]. Contacted Ada at [employer] and she accepted the referral. Ada reports they can't deliver the DME until they know when the pt is going home.

## 2024-09-11 NOTE — Evaluation (Signed)
 Occupational Therapy Evaluation Patient Details Name: Karen [name] MRN: [id number] DOB: 1976-07-11 Today's Date: 09/11/2024   History of Present Illness   Karen [name] is a 48 y.o. 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      s/p left foot transmetatarsal amputation, and right third toe partial amputation on 09/10/2024, recommend 7 days p.o. antibiotics, NWB LLE, okay to WBAT on right foot in postop shoe     Clinical Impressions Pt. Is very pleasant and cooperative during session. Pt. Is very motivated to work with therapy. Pt. Does not want to dc to snf. Pt. UE is wnl to assist with mobility. Pt. Is blind in L eye but states she compensates well. Pt. Lives with friend who is her primary caregiver. Pt. Friend owns the home and may be willing to make changes to make home more accessible. Pt. Wants to be as mobile and I  as possible when she dc home.      If plan is discharge home, recommend the following:   A lot of help with walking and/or transfers;A lot of help with bathing/dressing/bathroom     Functional Status Assessment   Patient has had a recent decline in their functional status and demonstrates the ability to make significant improvements in function in a reasonable and predictable amount of time.     Equipment Recommendations    (Pt. does not want bsc. discussed tub transfer bench.)     Recommendations for Other Services         Precautions/Restrictions   Precautions Precautions: Fall Restrictions Weight Bearing Restrictions Per Provider Order: Yes RLE Weight Bearing Per Provider Order: Weight bearing as tolerated (with  ortho shoe) LLE Weight Bearing Per Provider Order: Non weight bearing     Mobility Bed Mobility Overal bed mobility: Needs Assistance Bed Mobility: Supine to Sit     Supine to sit: Supervision, Used rails, HOB elevated          Transfers                          Balance                                           ADL either performed or assessed with clinical judgement   ADL Overall ADL's : Needs assistance/impaired Eating/Feeding: Independent   Grooming: Wash/dry hands;Wash/dry face;Oral care;Set up   Upper Body Bathing: Set up   Lower Body Bathing: Maximal assistance;Bed level   Upper Body Dressing : Sitting;Minimal assistance   Lower Body Dressing: Total assistance;Bed level                 General ADL Comments: pt. 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 does not want pain medicine at this time.)     Extremity/Trunk Assessment Upper Extremity Assessment Upper Extremity Assessment: RUE deficits/detail;LUE deficits/detail RUE Sensation: decreased light touch (fingertips) LUE Sensation: decreased light touch (fingertips)           Communication Communication Communication: No apparent difficulties   Cognition Arousal: Alert Behavior During Therapy: WFL for tasks assessed/performed Cognition: No apparent impairments                               Following commands: Intact       Cueing  General Comments          Exercises     Shoulder  Instructions      Home Living Family/patient expects to be discharged to:: Private residence Living Arrangements: Non-relatives/Friends Available Help at Discharge: Friend(s) Type of Home: House Home Access: Stairs to enter Secretary/administrator of Steps: 3 Entrance Stairs-Rails: Right;Left (can not reach both) Home Layout: One level     Bathroom Shower/Tub: Chief Strategy Officer: Standard Bathroom Accessibility:  (pt is unsure if she will be able to access.)   Home Equipment: Rollator (4 wheels);Wheelchair - manual;Shower seat;Hand held shower head          Prior Functioning/Environment Prior Level of Function : Needs assist       Physical Assist : Mobility (physical);ADLs (physical) Mobility (physical): Transfers;Stairs ADLs (physical): Bathing;Dressing;Toileting;IADLs Mobility Comments: Pt. needed Min a with sit to stand and could amb with rollator without assist in home. ADLs Comments: Pt. was Mod a with bathing. Pt. was Max A with LE dressing.    OT Problem List: Decreased activity tolerance;Impaired balance (sitting and/or standing)   OT Treatment/Interventions: Self-care/ADL training;DME and/or AE instruction;Therapeutic activities;Patient/family education      OT Goals(Current goals can be found in the care plan section)   Acute Rehab OT Goals Patient Stated Goal: to return home. OT Goal Formulation: With patient/family Time For Goal Achievement: 09/25/24 Potential to Achieve Goals: Good ADL Goals Pt Will Perform Lower Body Bathing: with mod assist Pt Will Perform Upper Body Dressing: with set-up;sitting Pt Will Perform Lower Body Dressing: with max assist;sitting/lateral leans Pt Will Transfer to Toilet: with min assist;stand pivot transfer Pt Will Perform Toileting - Clothing Manipulation and hygiene: with max assist   OT Frequency:  Min 2X/week    Co-evaluation              AM-PAC OT 6 Clicks Daily Activity     Outcome  Measure Help from another person eating meals?: None Help from another person taking care of personal grooming?: A Little Help from another person toileting, which includes using toliet, bedpan, or urinal?: Total Help from another person bathing (including washing, rinsing, drying)?: A Lot Help from another person to put on and taking off regular upper body clothing?: A Little Help from another person to put on and taking off regular lower body clothing?: Total 6 Click Score: 14   End of Session Nurse Communication:  (ok therapy)  Activity Tolerance: Patient tolerated treatment well Patient left: in bed;with call bell/phone within reach  OT Visit Diagnosis: Unsteadiness on feet (R26.81)                  Karen [name] 09/11/2024, 10:37 AM

## 2024-09-11 NOTE — Progress Notes (Signed)
 [facility] Progress Note   Subjective:   Seen in room. Resting but answering questions. No CP/dyspnea. S/p L TMA and R 3rd toe amputation yesterday, then dialyzed afterwards with 2L off. PD cath flushed yesterday as well.  Objective Vitals:   09/10/24 2022 09/11/24 0002 09/11/24 0441 09/11/24 0829  BP: (!) 138/55 (!) 149/66 (!) 144/88 (!) 108/58  Pulse: 88 78 85 76  Resp: 15 15 16 18   Temp: 99.6 F (37.6 C) 98.6 F (37 C) 98.6 F (37 C) 99.6 F (37.6 C)  TempSrc: Oral Oral Oral Rectal  SpO2: 96% 98% 98%   Weight:   89.7 kg   Height:       Physical Exam General: Well appearing, NAD Heart: RRR Lungs: CTA anteriorly Abdomen: soft, PD cath in place Extremities: no edema, feet bandaged Dialysis Access: LUE AVF + t/b  Additional Objective Labs: Basic Metabolic Panel: Recent Labs  Lab 09/07/24 0609 09/10/24 0450 09/11/24 0500  NA 135 132* 135  K 4.9 4.8 4.3  CL 97* 91* 95*  CO2 24 24 27   GLUCOSE 156* 180* 255*  BUN 38* 51* 27*  CREATININE 6.87* 7.97* 5.18*  CALCIUM  8.4* 8.1* 7.8*  PHOS  --  5.3* 4.5   Liver Function Tests: Recent Labs  Lab 09/06/24 1750 09/07/24 0609 09/10/24 0450 09/11/24 0500  AST 30 18  --   --   ALT 14 11  --   --   ALKPHOS 266* 205*  --   --   BILITOT 0.6 0.9  --   --   PROT 7.8 6.3*  --   --   ALBUMIN  3.1* 2.4* 2.2* 2.2*   CBC: Recent Labs  Lab 09/06/24 1750 09/07/24 0609 09/08/24 1510 09/10/24 0450 09/11/24 0500  WBC 13.8* 12.6* 11.7* 10.3 7.1  NEUTROABS 10.1* 8.4* 8.4* 7.6 3.8  HGB 10.3* 8.7* 8.7* 8.1* 8.7*  HCT 33.5* 28.7* 27.7* 25.2* 27.6*  MCV 97.4 98.0 94.2 92.3 94.8  PLT 392 325 331 309 332   Blood Culture    Component Value Date/Time   SDES TISSUE 09/10/2024 0800   SPECREQUEST LEFT FOREFOOT 09/10/2024 0800   CULT  09/10/2024 0800    CULTURE REINCUBATED FOR BETTER GROWTH Performed at [facility], [address] [place], [place] [address]    REPTSTATUS PENDING 09/10/2024 0800    Studies/Results: DG Foot 2 Views Left Result Date: 09/10/2024 EXAM: 2 VIEW(S) XRAY OF THE LEFT FOOT 09/10/2024 08:59:00 AM COMPARISON: 09/06/2024 CLINICAL HISTORY: [id number] Post-operative state 252351. Table formatting from the original note was not included.; Post-operative state [id number] Post-operative state 252351. Table formatting from the original note was not included.; Post-operative state [phone] FINDINGS: BONES AND JOINTS: Status post surgical amputation of all 5 rays at mid metatarsal level. SOFT TISSUES: Vascular calcifications are noted. IMPRESSION: 1. Status post amputation of all five toes at the mid metatarsal level. 2. Vascular calcifications. Electronically signed by: Lynwood Seip MD 09/10/2024 09:51 AM EDT RP Workstation: [id number]   DG Foot 2 Views Right Result Date: 09/10/2024 EXAM: 1 or 2 VIEW(S) XRAY OF THE RIGHT FOOT 09/10/2024 08:59:00 AM COMPARISON: 09/06/2024 CLINICAL HISTORY: Post-operative state [id number]. Table formatting from the original note was not included.; Post-operative state [phone] FINDINGS: BONES AND JOINTS: Interval surgical amputation of third middle and distal phalanges. Stable postsurgical changes seen involving fifth metatarsal. Stable chronic findings involving mid foot. No acute fracture. No focal osseous lesion. No joint dislocation. SOFT TISSUES: Vascular calcifications are noted. IMPRESSION: 1. Interval surgical amputation  of the third middle and distal phalanges. 2. Stable postsurgical changes involving the fifth metatarsal. 3. Stable chronic changes of the midfoot. 4. Vascular calcifications. Electronically signed by: Lynwood Seip MD 09/10/2024 09:47 AM EDT RP Workstation: [id number]   US  EKG SITE RITE Result Date: 09/09/2024 If Site Rite image not attached, placement could not be confirmed due to current cardiac rhythm.  Medications:  piperacillin -tazobactam (ZOSYN )  IV 2.25 g (09/11/24 0914)   vancomycin  1,000 mg (09/10/24 1836)    aspirin  EC  81 mg  Oral Daily   brimonidine  1 drop Both Eyes BID   Chlorhexidine  Gluconate Cloth  6 each Topical Q0600   cinacalcet   90 mg Oral Q breakfast   clopidogrel   75 mg Oral Daily   gabapentin   200 mg Oral q AM   And   gabapentin   100 mg Oral QHS   gentamicin cream  1 Application Topical Daily   insulin  aspart  0-6 Units Subcutaneous Q4H   insulin  glargine  5 Units Subcutaneous Daily   sodium chloride  flush  10-40 mL Intracatheter Q12H   sodium chloride  flush  3 mL Intravenous Q12H    Dialysis Orders MWF - [facility], BFR 400, EDW 90kg, 2K/2.5Ca bath, AVF, no heparin   Assessment/Plan: 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 On IV abx. ESRD: Continue HD on MWF schedule - next HD 10/13.  PD cath in place: Hasn't started training yet - continue flushes 2d/week (Mon/Fri).  HTN/volume: BP stable, below prior EDW - lower on discharge. Anemia of ESRD: Hgb 8.7 - start Aranesp  today. Secondary HPTH: CorrCa/Phos ok - on Fosrenol  powder at home, not on formulary here and refusing alt binder. Continue cinacalcet . Nutrition: Alb low, adding protein supps. T2DM: Insulin  per primary.   Izetta Boehringer, PA-C 09/11/2024, 9:45 AM  [employer]

## 2024-09-11 NOTE — Progress Notes (Signed)
 Subjective:  Patient ID: Karen [name], female    DOB: [birth year],  MRN: [id number]  Patient seen at bedside this morning s/p left transmetatarsal amputation and right third toe amputattion at PIPJ level. POD #1. Relates doing well no pain. She is ready to go home as soon as she can. Denies n/v/f/c.     Past Medical History:  Diagnosis Date   ADHD (attention deficit hyperactivity disorder)    Anemia    Arthritis    Asthma    mild = rarely uses inhaler   Blindness of left eye    retinopathy   Diabetes mellitus without complication (HCC)    type 2   Dyspnea    with exertion   Fibromyalgia    GERD (gastroesophageal reflux disease)    no meds   Hypertension    Lung nodule    1.0 cm LLL lung nodule 07/16/22 CT (evaluation by [facility] Dr. Allyne Render)   Osteomyelitis [facility])    RIGHT FOOT FIFTH TOE   Ovarian mass, left 06/26/2021   Renal disorder    ESRD   Sleep apnea    does not use cpap     Past Surgical History:  Procedure Laterality Date   ABDOMINAL AORTOGRAM W/LOWER EXTREMITY N/A 06/08/2018   Procedure: ABDOMINAL AORTOGRAM W/LOWER EXTREMITY;  Surgeon: Sheree Penne Bruckner, MD;  Location: [facility] INVASIVE CV LAB;  Service: Cardiovascular;  Laterality: N/A;   ABDOMINAL HYSTERECTOMY  09/2016   AMPUTATION Right 06/11/2018   Procedure: AMPUTATION RIGHT FIFTH TOE;  Surgeon: Eliza Bruckner RAMAN, MD;  Location: [facility] OR;  Service: Vascular;  Laterality: Right;   APPENDECTOMY     AV FISTULA PLACEMENT Left    CHOLECYSTECTOMY     COLON SURGERY     COLONOSCOPY  2023   CORONARY ATHERECTOMY N/A 01/27/2024   Procedure: CORONARY ATHERECTOMY;  Surgeon: Verlin Bruckner BIRCH, MD;  Location: [facility] INVASIVE CV LAB;  Service: Cardiovascular;  Laterality: N/A;   CORONARY STENT INTERVENTION N/A 01/27/2024   Procedure: CORONARY STENT INTERVENTION;  Surgeon: Verlin Bruckner BIRCH, MD;  Location: [facility] INVASIVE CV LAB;  Service: Cardiovascular;  Laterality: N/A;   cysts removed from top of head   59   age 87yr   EYE SURGERY Bilateral    retinal - left eye blind   EYE SURGERY Right    cataract removed with lens placement   INSERTION OF DIALYSIS CATHETER N/A 08/06/2022   Procedure: INSERTION OF RIGHT INTERNAL JUGULAR TUNNELED DIALYSIS CATHETER;  Surgeon: Eliza Bruckner RAMAN, MD;  Location: [facility] OR;  Service: Vascular;  Laterality: N/A;   LEFT HEART CATH AND CORONARY ANGIOGRAPHY N/A 01/27/2024   Procedure: LEFT HEART CATH AND CORONARY ANGIOGRAPHY;  Surgeon: Verlin Bruckner BIRCH, MD;  Location: [facility] INVASIVE CV LAB;  Service: Cardiovascular;  Laterality: N/A;   LOWER EXTREMITY ANGIOGRAPHY N/A 09/08/2024   Procedure: Lower Extremity Angiography;  Surgeon: Lanis Fonda BRAVO, MD;  Location: [facility] INVASIVE CV LAB;  Service: Cardiovascular;  Laterality: N/A;   LOWER EXTREMITY INTERVENTION  09/08/2024   Procedure: LOWER EXTREMITY INTERVENTION;  Surgeon: Lanis Fonda BRAVO, MD;  Location: [facility] INVASIVE CV LAB;  Service: Cardiovascular;;   reimplantation of uterer     at age 63 yrs   REVISON OF ARTERIOVENOUS FISTULA Left 08/06/2022   Procedure: REVISON OF LEFT ARTERIOVENOUS FISTULA WITH REPAIR OF PSEUDOANEURYSMS AND SUPERFISTULIZATIONS;  Surgeon: Eliza Bruckner RAMAN, MD;  Location: [facility] OR;  Service: Vascular;  Laterality: Left;   TRIGGER FINGER RELEASE     right hand ring finger  TUBAL LIGATION     UPPER GI ENDOSCOPY  2017   WISDOM TOOTH EXTRACTION         Latest Ref Rng & Units 09/11/2024    5:00 AM 09/10/2024    4:50 AM 09/08/2024    3:10 PM  CBC  WBC 4.0 - 10.5 K/uL 7.1  10.3  11.7   Hemoglobin 12.0 - 15.0 g/dL 8.7  8.1  8.7   Hematocrit 36.0 - 46.0 % 27.6  25.2  27.7   Platelets 150 - 400 K/uL 332  309  331        Latest Ref Rng & Units 09/11/2024    5:00 AM 09/10/2024    4:50 AM 09/07/2024    6:09 AM  BMP  Glucose 70 - 99 mg/dL 744  819  843   BUN 6 - 20 mg/dL 27  51  38   Creatinine 0.44 - 1.00 mg/dL 4.81  2.02  3.12   Sodium 135 - 145 mmol/L 135  132  135   Potassium 3.5 - 5.1  mmol/L 4.3  4.8  4.9   Chloride 98 - 111 mmol/L 95  91  97   CO2 22 - 32 mmol/L 27  24  24    Calcium  8.9 - 10.3 mg/dL 7.8  8.1  8.4      Objective:   Vitals:   09/11/24 0441 09/11/24 0829  BP: (!) 144/88 (!) 108/58  Pulse: 85 76  Resp: 16 18  Temp: 98.6 F (37 C) 99.6 F (37.6 C)  SpO2: 98%     General:AA&O x 3. Normal mood and affect   Vascular: DP and PT pulses 2/4 bilateral. Brisk capillary refill to all digits. Pedal hair present   Neruological. Epicritic sensation grossly intact.   Derm: Bilateral dressing clean dry and intact with no strike through noted.   MSK: MMT 5/5 in dorsiflexion, plantar flexion, inversion and eversion. Normal joint ROM without pain or crepitus.        Assessment & Plan:  Patient was evaluated and treated and all questions answered.  DX: s/p left transmetatarsal amputation and right third toe amputattion at PIPJ level Patient will be WBAT in surgical shoe on right. NWB in surgical shoe on left. The dressing to remain clean dry and intact.  Recommend 7 days po abx.  Will need PT eval to make sure she can navigate a few steps getting into her house NWB on left.  Patient is ok for discharge follow PT evaluation if still here Sunday will plan on changing dressing. Other wise will follow-up in our office this week for dressing change.   Asberry Failing, DPM  Accessible via secure chat for questions or concerns.

## 2024-09-11 NOTE — Inpatient Diabetes Management (Signed)
 Inpatient Diabetes Program Recommendations  AACE/ADA: New Consensus Statement on Inpatient Glycemic Control (2015)  Target Ranges:  Prepandial:   less than 140 mg/dL      Peak postprandial:   less than 180 mg/dL (1-2 hours)      Critically ill patients:  140 - 180 mg/dL   Lab Results  Component Value Date   GLUCAP 202 (H) 09/11/2024   HGBA1C 6.5 (H) 09/06/2024    Review of Glycemic Control  Diabetes history: DM Outpatient Diabetes medications: Insulin  pump Current orders for Inpatient glycemic control: Lantus  5 units BID, Novolog  0-6 Q4H  Inpatient Diabetes Program Recommendations:    Consider changing Novolog  to 0-6 TID with meals  Consider adding Novolog  3 units TID with meals if eating > 50%  Continue to follow.  Thank you. Shona Brandy, RD, LDN, CDCES Inpatient Diabetes Coordinator [phone]

## 2024-09-12 DIAGNOSIS — A419 Sepsis, unspecified organism: Secondary | ICD-10-CM | POA: Diagnosis not present

## 2024-09-12 DIAGNOSIS — R6520 Severe sepsis without septic shock: Secondary | ICD-10-CM | POA: Diagnosis not present

## 2024-09-12 LAB — CBC WITH DIFFERENTIAL/PLATELET
Abs Immature Granulocytes: 0.04 K/uL (ref 0.00–0.07)
Basophils Absolute: 0.1 K/uL (ref 0.0–0.1)
Basophils Relative: 1 %
Eosinophils Absolute: 0.3 K/uL (ref 0.0–0.5)
Eosinophils Relative: 4 %
HCT: 24.8 % — ABNORMAL LOW (ref 36.0–46.0)
Hemoglobin: 7.8 g/dL — ABNORMAL LOW (ref 12.0–15.0)
Immature Granulocytes: 1 %
Lymphocytes Relative: 22 %
Lymphs Abs: 1.8 K/uL (ref 0.7–4.0)
MCH: 29.7 pg (ref 26.0–34.0)
MCHC: 31.5 g/dL (ref 30.0–36.0)
MCV: 94.3 fL (ref 80.0–100.0)
Monocytes Absolute: 1 K/uL (ref 0.1–1.0)
Monocytes Relative: 11 %
Neutro Abs: 5.3 K/uL (ref 1.7–7.7)
Neutrophils Relative %: 61 %
Platelets: 297 K/uL (ref 150–400)
RBC: 2.63 MIL/uL — ABNORMAL LOW (ref 3.87–5.11)
RDW: 13.4 % (ref 11.5–15.5)
WBC: 8.6 K/uL (ref 4.0–10.5)
nRBC: 0 % (ref 0.0–0.2)

## 2024-09-12 LAB — GLUCOSE, CAPILLARY
Glucose-Capillary: 315 mg/dL — ABNORMAL HIGH (ref 70–99)
Glucose-Capillary: 328 mg/dL — ABNORMAL HIGH (ref 70–99)
Glucose-Capillary: 137 mg/dL — ABNORMAL HIGH (ref 70–99)
Glucose-Capillary: 278 mg/dL — ABNORMAL HIGH (ref 70–99)

## 2024-09-12 LAB — RENAL FUNCTION PANEL
Albumin: 2 g/dL — ABNORMAL LOW (ref 3.5–5.0)
Anion gap: 13 (ref 5–15)
BUN: 42 mg/dL — ABNORMAL HIGH (ref 6–20)
CO2: 27 mmol/L (ref 22–32)
Calcium: 7.8 mg/dL — ABNORMAL LOW (ref 8.9–10.3)
Chloride: 91 mmol/L — ABNORMAL LOW (ref 98–111)
Creatinine, Ser: 6.87 mg/dL — ABNORMAL HIGH (ref 0.44–1.00)
GFR, Estimated: 7 mL/min — ABNORMAL LOW (ref >60)
Glucose, Bld: 360 mg/dL — ABNORMAL HIGH (ref 70–99)
Phosphorus: 4.9 mg/dL — ABNORMAL HIGH (ref 2.5–4.6)
Potassium: 4.6 mmol/L (ref 3.5–5.1)
Sodium: 131 mmol/L — ABNORMAL LOW (ref 135–145)

## 2024-09-12 MED ORDER — INSULIN ASPART 100 UNIT/ML IJ SOLN: 0.0000 [IU] | Freq: Three times a day (TID) | INTRAMUSCULAR | Status: DC

## 2024-09-12 MED ORDER — INSULIN ASPART 100 UNIT/ML IJ SOLN
3.0000 [IU] | Freq: Three times a day (TID) | INTRAMUSCULAR | Status: DC
  Administered 2024-09-12 – 2024-09-13 (×3): 3 [IU] via SUBCUTANEOUS

## 2024-09-12 MED ORDER — INSULIN ASPART 100 UNIT/ML IJ SOLN
0.0000 [IU] | INTRAMUSCULAR | Status: DC
  Administered 2024-09-12: 1 [IU] via SUBCUTANEOUS
  Administered 2024-09-12: 5 [IU] via SUBCUTANEOUS
  Administered 2024-09-12: 7 [IU] via SUBCUTANEOUS
  Administered 2024-09-13: 2 [IU] via SUBCUTANEOUS
  Administered 2024-09-13: 1 [IU] via SUBCUTANEOUS

## 2024-09-12 MED ORDER — INSULIN ASPART 100 UNIT/ML IJ SOLN: 0.0000 [IU] | Freq: Every day | INTRAMUSCULAR | Status: DC

## 2024-09-12 NOTE — Progress Notes (Signed)
 PROGRESS NOTE    Karen [name]  FMW:[id number] DOB: 1975-12-16 DOA: 09/06/2024 PCP: Lesa Jon HERO, PA  Chief Complaint  Patient presents with   Wound Check    Brief Narrative:  Karen [name] is a 48 y.o. 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     Assessment & Plan:   Principal Problem:   Severe sepsis (HCC) Active Problems:   DM2 (diabetes mellitus, type 2) (HCC)   End stage renal disease on dialysis (HCC)   Cellulitis of left lower extremity   Fever   Leukocytosis   Pyogenic inflammation of bone (HCC)   Gangrene of toe of left foot (HCC)   Acute osteomyelitis of toe, right (HCC)   Acute osteomyelitis of left foot (HCC)   Left Diabetic Foot Infection Sepsis due to L Diabetic Foot Infection/osteomyelitis Bilateral lower extremity critical limb ischemia with tissue loss at the toes, left greater than right Currently afebrile, with leukocytosis MRI with multiple sites of osteomyelitis involving the forefoot (left) Vascular surgery consulted, s/p aortogram, left lower extremity angiogram s/p left popliteal artery drug-coated balloon angioplasty on 10/8 Podiatry on board, s/p left foot transmetatarsal amputation, and right third toe partial amputation on 09/10/2024, recommend 7 days p.o. antibiotics, okay to WBAT on right foot in postop shoe, NWB on left, concern for healing of plantar flap, will monitor Surgical wound culture growing abundant Morganella morganii, Proteus mirabilis, awaiting final report Continue IV vancomycin , Zosyn , pending surgical wound culture Unable to tolerate statins, follows outpatient [facility] lipid  clinic PT/OT  T2DM with neuropathy, uncontrolled Last A1c 6.5 on 09/2024 Continue SSI, lantus , Accu-Cheks, hypoglycemic protocol, adjust  On insulin  pump at home Continue PTA gabapentin   CAD s/p recent PCI HLD-LDL 136 on 07/2024 S/p PTCA/orbital atherectomy/DESx1 mid LAD 01/2024 - recommended DAPT with aspirin /brilinta  x1 year at that time (medical management of disease in small caliber distal circumflex) - Per recent cardiology note 07/2024, can hold aspirin  and plavix  5-7 days if possible hold aspirin  and continue plavix  will clarify with podiatry whether they'd prefer antiplatelets held (per discussion with Dr. Malvin, ok with continuing) Curbsided cards who noted it was ok to hold 5-7 days if needed, but resume ASP Will continue DAPT for now Patient unable to tolerate statins as it causes her LFTs to be elevated, follows with [facility] lipid clinic, considering her for Repatha/injectables  ESRD  Renal c/s for volume/dialysis management  On HD MWF Plan to initiate PD once able (noted PD catheter in place), defer to outpatient nephrologist   Anemia of chronic kidney disease Normocytic anemia Baseline hemoglobin around 10, currently around 8 Anemia panel showed iron 19, sats 13, ferritin 1207 Daily CBC  Glaucoma Eye drops   Obesity class I Body mass index is 32.9 kg/m.    DVT prophylaxis: SCD, (attributes [place] heparin  to vitreous haemorrhage) Code Status: full Family Communication: none Disposition:   Status is: Inpatient Remains inpatient appropriate because: need for continued inpatient care   Consultants:  Renal Podiatry vascular  Procedures:  Left lower extremity angiogram on 10/8 Amputation as noted above by podiatry on 10/10  Antimicrobials:  Anti-infectives (From admission, onward)    Start     Dose/Rate Route Frequency Ordered Stop   09/10/24 [id number]  vancomycin  (VANCOCIN ) powder  Status:  Discontinued          As needed 09/10/24 0728 09/10/24 0831    09/09/24 1515  vancomycin  (VANCOCIN ) IVPB 1000 mg/200 mL premix        1,000 mg 200 mL/hr over 60 Minutes Intravenous  Once 09/09/24 1420 09/09/24 1622   09/09/24 1000  vancomycin  (VANCOCIN ) IVPB 1000 mg/200 mL premix  Status:  Discontinued        1,000 mg 200 mL/hr over 60 Minutes Intravenous  Once 09/09/24 0902 09/09/24 1116   09/08/24 1200  vancomycin  (VANCOCIN ) IVPB 1000 mg/200 mL premix        1,000 mg 200 mL/hr over 60 Minutes Intravenous Every M-W-F (Hemodialysis) 09/06/24 2152     09/06/24 2200  piperacillin -tazobactam (ZOSYN ) IVPB 2.25 g        2.25 g 100 mL/hr over 30 Minutes Intravenous Every 8 hours 09/06/24 2148     09/06/24 2200  vancomycin  (VANCOREADY) IVPB 2000 mg/400 mL        2,000 mg 200 mL/hr over 120 Minutes Intravenous  Once 09/06/24 2152 09/07/24 0059   09/06/24 1900  Ampicillin-Sulbactam (UNASYN) 3 g in sodium chloride  0.9 % 100 mL IVPB       Placed in And Linked Group   3 g 200 mL/hr over 30 Minutes Intravenous  Once 09/06/24 1858 09/06/24 2019   09/06/24 1900  linezolid (ZYVOX) IVPB 600 mg       Placed in And Linked Group   600 mg 300 mL/hr over 60 Minutes Intravenous  Once 09/06/24 1858 09/06/24 2057       Subjective: Today, patient denies any new complaints.  Eager to be discharged.   Objective: Vitals:   09/12/24 0032 09/12/24 0538 09/12/24 0737 09/12/24 1157  BP: (!) 145/56 128/60 (!) 137/48 (!) 177/69  Pulse: 77 72 74 80  Resp: 20 19  14   Temp: 98.8 F (37.1 C) 98.2 F (36.8 C) 98.4 F (36.9 C) 99 F (37.2 C)  TempSrc: Oral Oral Oral Oral  SpO2: 98% 99% 98% 100%  Weight:  89.7 kg    Height:        Intake/Output Summary (Last 24 hours) at 09/12/2024 1609 Last data filed at 09/12/2024 0343 Gross per 24 hour  Intake 1170.41 ml  Output --  Net 1170.41 ml   Filed Weights   09/10/24 0657 09/11/24 0441 09/12/24 0538  Weight: 92.6 kg 89.7 kg 89.7 kg    Examination: General: NAD  Cardiovascular: S1, S2 present Respiratory:  CTAB Abdomen: Soft, nontender, nondistended, bowel sounds present Musculoskeletal: Noted BLE dressing c/d/I Skin: As noted above Psychiatry: Normal mood     Data Reviewed: I have personally reviewed following labs and imaging studies  CBC: Recent Labs  Lab 09/07/24 0609 09/08/24 1510 09/10/24 0450 09/11/24 0500 09/12/24 0416  WBC 12.6* 11.7* 10.3 7.1 8.6  NEUTROABS 8.4* 8.4* 7.6 3.8 5.3  HGB 8.7* 8.7* 8.1* 8.7* 7.8*  HCT 28.7* 27.7* 25.2* 27.6* 24.8*  MCV 98.0 94.2 92.3 94.8 94.3  PLT 325 331 309 332 297    Basic Metabolic Panel: Recent Labs  Lab 09/06/24 1750 09/07/24 0609 09/10/24 0450 09/11/24 0500 09/12/24 0416  NA 137 135 132* 135 131*  K 4.7 4.9 4.8 4.3 4.6  CL 96* 97* 91* 95* 91*  CO2 26 24 24 27 27   GLUCOSE 123* 156* 180* 255* 360*  BUN 30* 38* 51* 27* 42*  CREATININE 5.96* 6.87* 7.97* 5.18* 6.87*  CALCIUM  8.8* 8.4* 8.1*  7.8* 7.8*  MG 2.2 2.0  --   --   --   PHOS  --   --  5.3* 4.5 4.9*    GFR: Estimated Creatinine Clearance: 11.1 mL/min (A) (by C-G formula based on SCr of 6.87 mg/dL (H)).  Liver Function Tests: Recent Labs  Lab 09/06/24 1750 09/07/24 0609 09/10/24 0450 09/11/24 0500 09/12/24 0416  AST 30 18  --   --   --   ALT 14 11  --   --   --   ALKPHOS 266* 205*  --   --   --   BILITOT 0.6 0.9  --   --   --   PROT 7.8 6.3*  --   --   --   ALBUMIN  3.1* 2.4* 2.2* 2.2* 2.0*    CBG: Recent Labs  Lab 09/11/24 1253 09/11/24 1728 09/11/24 2109 09/12/24 0801 09/12/24 1200  GLUCAP 185* 153* 239* 315* 328*     Recent Results (from the past 240 hours)  Culture, blood (Routine x 2)     Status: None   Collection Time: 09/06/24  4:33 PM   Specimen: BLOOD RIGHT ARM  Result Value Ref Range Status   Specimen Description BLOOD RIGHT ARM  Final   Special Requests   Final    BOTTLES DRAWN AEROBIC AND ANAEROBIC Blood Culture adequate volume   Culture   Final    NO GROWTH 5 DAYS Performed at [facility], [address]  [place], [place] [address]    Report Status 09/11/2024 FINAL  Final  Resp panel by RT-PCR (RSV, Flu A&B, Covid) Anterior Nasal Swab     Status: None   Collection Time: 09/06/24  6:54 PM   Specimen: Anterior Nasal Swab  Result Value Ref Range Status   SARS Coronavirus 2 by RT PCR NEGATIVE NEGATIVE Final   Influenza A by PCR NEGATIVE NEGATIVE Final   Influenza B by PCR NEGATIVE NEGATIVE Final    Comment: (NOTE) The Xpert Xpress SARS-CoV-2/FLU/RSV plus assay is intended as an aid in the diagnosis of influenza from Nasopharyngeal swab specimens and should not be used as a sole basis for treatment. Nasal washings and aspirates are unacceptable for Xpert Xpress SARS-CoV-2/FLU/RSV testing.  Fact Sheet for Patients: BloggerCourse.com  Fact Sheet for Healthcare Providers: SeriousBroker.it  This test is not yet approved or cleared by the [place]  FDA and has been authorized for detection and/or diagnosis of SARS-CoV-2 by FDA under an Emergency Use Authorization (EUA). This EUA will remain in effect (meaning this test can be used) for the duration of the COVID-19 declaration under Section 564(b)(1) of the Act, 21 U.S.C. section 360bbb-3(b)(1), unless the authorization is terminated or revoked.     Resp Syncytial Virus by PCR NEGATIVE NEGATIVE Final    Comment: (NOTE) Fact Sheet for Patients: BloggerCourse.com  Fact Sheet for Healthcare Providers: SeriousBroker.it  This test is not yet approved or cleared by the [place]  FDA and has been authorized for detection and/or diagnosis of SARS-CoV-2 by FDA under an Emergency Use Authorization (EUA). This EUA will remain in effect (meaning this test can be used) for the duration of the COVID-19 declaration under Section 564(b)(1) of the Act, 21 U.S.C. section 360bbb-3(b)(1), unless the authorization is terminated or revoked.  Performed at  [facility], [address] [place], [place] [address]   Aerobic/Anaerobic Culture w Gram Stain (surgical/deep wound)     Status: None (Preliminary result)   Collection Time: 09/10/24  8:00 AM  Specimen: Foot, Left; Tissue  Result Value Ref Range Status   Specimen Description TISSUE  Final   Special Requests LEFT FOREFOOT  Final   Gram Stain   Final    NO WBC SEEN FEW GRAM POSITIVE COCCI IN PAIRS Performed at [facility], [address] [place], [place] [address]    Culture   Final    ABUNDANT MORGANELLA MORGANII ABUNDANT PROTEUS MIRABILIS SUSCEPTIBILITIES TO FOLLOW NO ANAEROBES ISOLATED; CULTURE IN PROGRESS FOR 5 DAYS    Report Status PENDING  Incomplete         Radiology Studies: No results found.       Scheduled Meds:  (feeding supplement) PROSource Plus  30 mL Oral BID BM   aspirin  EC  81 mg Oral Daily   brimonidine  1 drop Both Eyes BID   Chlorhexidine  Gluconate Cloth  6 each Topical Q0600   cinacalcet   90 mg Oral Q breakfast   clopidogrel   75 mg Oral Daily   [START ON 09/17/2024] darbepoetin (ARANESP ) injection - DIALYSIS  60 mcg Subcutaneous Q Fri   gabapentin   200 mg Oral q AM   And   gabapentin   100 mg Oral QHS   gentamicin cream  1 Application Topical Daily   insulin  aspart  0-5 Units Subcutaneous QHS   insulin  aspart  0-9 Units Subcutaneous Q4H   insulin  aspart  3 Units Subcutaneous TID WC   insulin  glargine  5 Units Subcutaneous BID   sodium chloride  flush  10-40 mL Intracatheter Q12H   sodium chloride  flush  3 mL Intravenous Q12H   Continuous Infusions:  piperacillin -tazobactam (ZOSYN )  IV 2.25 g (09/12/24 1000)   vancomycin  Stopped (09/10/24 1937)     LOS: 6 days     Lebron JINNY Cage, MD [facility]   To contact the attending provider between 7A-7P or the covering provider during after hours 7P-7A, please log into the web site [email or web address] and access using universal [place] password for that web site. If  you do not have the password, please call the hospital operator.  09/12/2024, 4:09 PM

## 2024-09-12 NOTE — Plan of Care (Signed)
   Problem: Nutritional: Goal: Maintenance of adequate nutrition will improve Outcome: Progressing   Problem: Education: Goal: Knowledge of General Education information will improve Description: Including pain rating scale, medication(s)/side effects and non-pharmacologic comfort measures Outcome: Progressing

## 2024-09-12 NOTE — Progress Notes (Signed)
 [facility] Progress Note   Subjective:   Seen in room - no CP/dyspnea. Dispo plan pending. For HD tomorrow.  Objective Vitals:   09/11/24 2036 09/12/24 0032 09/12/24 0538 09/12/24 0737  BP: (!) 137/56 (!) 145/56 128/60 (!) 137/48  Pulse: 81 77 72 74  Resp: 18 20 19    Temp: 99.4 F (37.4 C) 98.8 F (37.1 C) 98.2 F (36.8 C) 98.4 F (36.9 C)  TempSrc: Oral Oral Oral Oral  SpO2: 94% 98% 99% 98%  Weight:   89.7 kg   Height:       Physical Exam General: Well appearing, NAD Heart: RRR Lungs: CTA anteriorly Abdomen: soft, PD cath in place Extremities: no edema, feet bandaged Dialysis Access: LUE AVF + t/b  Additional Objective Labs: Basic Metabolic Panel: Recent Labs  Lab 09/10/24 0450 09/11/24 0500 09/12/24 0416  NA 132* 135 131*  K 4.8 4.3 4.6  CL 91* 95* 91*  CO2 24 27 27   GLUCOSE 180* 255* 360*  BUN 51* 27* 42*  CREATININE 7.97* 5.18* 6.87*  CALCIUM  8.1* 7.8* 7.8*  PHOS 5.3* 4.5 4.9*   Liver Function Tests: Recent Labs  Lab 09/06/24 1750 09/07/24 0609 09/10/24 0450 09/11/24 0500 09/12/24 0416  AST 30 18  --   --   --   ALT 14 11  --   --   --   ALKPHOS 266* 205*  --   --   --   BILITOT 0.6 0.9  --   --   --   PROT 7.8 6.3*  --   --   --   ALBUMIN  3.1* 2.4* 2.2* 2.2* 2.0*   CBC: Recent Labs  Lab 09/07/24 0609 09/08/24 1510 09/10/24 0450 09/11/24 0500 09/12/24 0416  WBC 12.6* 11.7* 10.3 7.1 8.6  NEUTROABS 8.4* 8.4* 7.6 3.8 5.3  HGB 8.7* 8.7* 8.1* 8.7* 7.8*  HCT 28.7* 27.7* 25.2* 27.6* 24.8*  MCV 98.0 94.2 92.3 94.8 94.3  PLT 325 331 309 332 297   Medications:  piperacillin -tazobactam (ZOSYN )  IV 2.25 g (09/12/24 1000)   vancomycin  Stopped (09/10/24 1937)    (feeding supplement) PROSource Plus  30 mL Oral BID BM   aspirin  EC  81 mg Oral Daily   brimonidine  1 drop Both Eyes BID   Chlorhexidine  Gluconate Cloth  6 each Topical Q0600   cinacalcet   90 mg Oral Q breakfast   clopidogrel   75 mg Oral Daily   [START ON  09/17/2024] darbepoetin (ARANESP ) injection - DIALYSIS  60 mcg Subcutaneous Q Fri   gabapentin   200 mg Oral q AM   And   gabapentin   100 mg Oral QHS   gentamicin cream  1 Application Topical Daily   insulin  aspart  0-6 Units Subcutaneous TID WC   insulin  aspart  3 Units Subcutaneous TID WC   insulin  glargine  5 Units Subcutaneous BID   sodium chloride  flush  10-40 mL Intracatheter Q12H   sodium chloride  flush  3 mL Intravenous Q12H    Dialysis Orders MWF - [facility], BFR 400, EDW 90kg, 2K/2.5Ca bath, AVF, no heparin    Assessment/Plan: 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 On IV abx. ESRD: Continue HD on MWF schedule - next HD 10/13.  PD cath in place: Hasn't started training yet - continue flushes 2d/week (Mon/Fri).  HTN/volume: BP stable, below prior EDW - lower on discharge. Anemia of ESRD: Hgb 7.8 - Aranesp  ordered, pharmacy moved start date to 10/17 -  follow. Secondary HPTH: CorrCa/Phos ok - on Fosrenol  powder at home, not on formulary here and refusing alt binder. Continue cinacalcet . Nutrition: Alb low, continue protein supps. T2DM: Insulin  per primary.   Izetta Boehringer, PA-C 09/12/2024, 10:13 AM  [employer]

## 2024-09-12 NOTE — Plan of Care (Signed)

## 2024-09-12 NOTE — Progress Notes (Signed)
 Subjective:  Patient ID: Karen [name], female    DOB: [birth year],  MRN: [id number]  Patient seen at bedside this morning s/p left transmetatarsal amputation and right third toe amputattion at PIPJ level. POD #2. Relates doing well no pain. She is ready to go home as soon as she can planning to go home to her sisters house. Denies n/v/f/c.     Past Medical History:  Diagnosis Date   ADHD (attention deficit hyperactivity disorder)    Anemia    Arthritis    Asthma    mild = rarely uses inhaler   Blindness of left eye    retinopathy   Diabetes mellitus without complication (HCC)    type 2   Dyspnea    with exertion   Fibromyalgia    GERD (gastroesophageal reflux disease)    no meds   Hypertension    Lung nodule    1.0 cm LLL lung nodule 07/16/22 CT (evaluation by [facility] Dr. Allyne Render)   Osteomyelitis [facility])    RIGHT FOOT FIFTH TOE   Ovarian mass, left 06/26/2021   Renal disorder    ESRD   Sleep apnea    does not use cpap     Past Surgical History:  Procedure Laterality Date   ABDOMINAL AORTOGRAM W/LOWER EXTREMITY N/A 06/08/2018   Procedure: ABDOMINAL AORTOGRAM W/LOWER EXTREMITY;  Surgeon: Sheree Penne Bruckner, MD;  Location: [facility] INVASIVE CV LAB;  Service: Cardiovascular;  Laterality: N/A;   ABDOMINAL HYSTERECTOMY  09/2016   AMPUTATION Right 06/11/2018   Procedure: AMPUTATION RIGHT FIFTH TOE;  Surgeon: Eliza Bruckner RAMAN, MD;  Location: [facility] OR;  Service: Vascular;  Laterality: Right;   APPENDECTOMY     AV FISTULA PLACEMENT Left    CHOLECYSTECTOMY     COLON SURGERY     COLONOSCOPY  2023   CORONARY ATHERECTOMY N/A 01/27/2024   Procedure: CORONARY ATHERECTOMY;  Surgeon: Verlin Bruckner BIRCH, MD;  Location: [facility] INVASIVE CV LAB;  Service: Cardiovascular;  Laterality: N/A;   CORONARY STENT INTERVENTION N/A 01/27/2024   Procedure: CORONARY STENT INTERVENTION;  Surgeon: Verlin Bruckner BIRCH, MD;  Location: [facility] INVASIVE CV LAB;  Service: Cardiovascular;  Laterality:  N/A;   cysts removed from top of head  42   age 48yr   EYE SURGERY Bilateral    retinal - left eye blind   EYE SURGERY Right    cataract removed with lens placement   INSERTION OF DIALYSIS CATHETER N/A 08/06/2022   Procedure: INSERTION OF RIGHT INTERNAL JUGULAR TUNNELED DIALYSIS CATHETER;  Surgeon: Eliza Bruckner RAMAN, MD;  Location: [facility] OR;  Service: Vascular;  Laterality: N/A;   LEFT HEART CATH AND CORONARY ANGIOGRAPHY N/A 01/27/2024   Procedure: LEFT HEART CATH AND CORONARY ANGIOGRAPHY;  Surgeon: Verlin Bruckner BIRCH, MD;  Location: [facility] INVASIVE CV LAB;  Service: Cardiovascular;  Laterality: N/A;   LOWER EXTREMITY ANGIOGRAPHY N/A 09/08/2024   Procedure: Lower Extremity Angiography;  Surgeon: Lanis Fonda BRAVO, MD;  Location: [facility] INVASIVE CV LAB;  Service: Cardiovascular;  Laterality: N/A;   LOWER EXTREMITY INTERVENTION  09/08/2024   Procedure: LOWER EXTREMITY INTERVENTION;  Surgeon: Lanis Fonda BRAVO, MD;  Location: [facility] INVASIVE CV LAB;  Service: Cardiovascular;;   reimplantation of uterer     at age 48 yrs   REVISON OF ARTERIOVENOUS FISTULA Left 08/06/2022   Procedure: REVISON OF LEFT ARTERIOVENOUS FISTULA WITH REPAIR OF PSEUDOANEURYSMS AND SUPERFISTULIZATIONS;  Surgeon: Eliza Bruckner RAMAN, MD;  Location: [facility] OR;  Service: Vascular;  Laterality: Left;   TRIGGER FINGER RELEASE  right hand ring finger   TUBAL LIGATION     UPPER GI ENDOSCOPY  2017   WISDOM TOOTH EXTRACTION         Latest Ref Rng & Units 09/12/2024    4:16 AM 09/11/2024    5:00 AM 09/10/2024    4:50 AM  CBC  WBC 4.0 - 10.5 K/uL 8.6  7.1  10.3   Hemoglobin 12.0 - 15.0 g/dL 7.8  8.7  8.1   Hematocrit 36.0 - 46.0 % 24.8  27.6  25.2   Platelets 150 - 400 K/uL 297  332  309        Latest Ref Rng & Units 09/12/2024    4:16 AM 09/11/2024    5:00 AM 09/10/2024    4:50 AM  BMP  Glucose 70 - 99 mg/dL 639  744  819   BUN 6 - 20 mg/dL 42  27  51   Creatinine 0.44 - 1.00 mg/dL 3.12  4.81  2.02   Sodium 135 - 145  mmol/L 131  135  132   Potassium 3.5 - 5.1 mmol/L 4.6  4.3  4.8   Chloride 98 - 111 mmol/L 91  95  91   CO2 22 - 32 mmol/L 27  27  24    Calcium  8.9 - 10.3 mg/dL 7.8  7.8  8.1      Objective:   Vitals:   09/12/24 0737 09/12/24 1157  BP: (!) 137/48 (!) 177/69  Pulse: 74 80  Resp:  14  Temp: 98.4 F (36.9 C) 99 F (37.2 C)  SpO2: 98% 100%    General:AA&O x 3. Normal mood and affect   Vascular: DP and PT pulses 2/4 bilateral. Brisk capillary refill to all digits. Pedal hair present   Neruological. Epicritic sensation grossly intact.   Derm: Bilateral dressing clean dry and intact. Right incision well coapted with no signs of dehiscence and dorsal wound superficial and improving. Left transmetatarsxal incision intact and well coapted. There is significant darkening aroudn the incision site concern for necrosis. Duskiness noted to planatr flap.   MSK: MMT 5/5 in dorsiflexion, plantar flexion, inversion and eversion. Normal joint ROM without pain or crepitus.           Assessment & Plan:  Patient was evaluated and treated and all questions answered.  DX: s/p left transmetatarsal amputation and right third toe amputattion at PIPJ level Patient will be WBAT in surgical shoe on right. NWB in surgical shoe on left. The dressing to remain clean dry and intact.  Recommend 7 days po abx.  Will need PT eval to make sure she can navigate a few steps getting into her house NWB on left.  Concern for healing of plantar flap. Currently demarcating and will need to wait for further demarcation to decide if needed further amputation vs potential wound care vs more proximal amputation.  Podiatry to continue to follow.   Asberry Failing, DPM  Accessible via secure chat for questions or concerns.

## 2024-09-13 ENCOUNTER — Encounter (HOSPITAL_COMMUNITY): Payer: Self-pay | Admitting: Podiatry

## 2024-09-13 ENCOUNTER — Other Ambulatory Visit (HOSPITAL_COMMUNITY): Payer: Self-pay

## 2024-09-13 DIAGNOSIS — A419 Sepsis, unspecified organism: Secondary | ICD-10-CM | POA: Diagnosis not present

## 2024-09-13 DIAGNOSIS — R6520 Severe sepsis without septic shock: Secondary | ICD-10-CM | POA: Diagnosis not present

## 2024-09-13 LAB — CBC WITH DIFFERENTIAL/PLATELET
Abs Immature Granulocytes: 0.05 K/uL (ref 0.00–0.07)
Basophils Absolute: 0.1 K/uL (ref 0.0–0.1)
Basophils Relative: 1 %
Eosinophils Absolute: 0.5 K/uL (ref 0.0–0.5)
Eosinophils Relative: 4 %
HCT: 24.9 % — ABNORMAL LOW (ref 36.0–46.0)
Hemoglobin: 7.9 g/dL — ABNORMAL LOW (ref 12.0–15.0)
Immature Granulocytes: 1 %
Lymphocytes Relative: 25 %
Lymphs Abs: 2.6 K/uL (ref 0.7–4.0)
MCH: 29.5 pg (ref 26.0–34.0)
MCHC: 31.7 g/dL (ref 30.0–36.0)
MCV: 92.9 fL (ref 80.0–100.0)
Monocytes Absolute: 0.9 K/uL (ref 0.1–1.0)
Monocytes Relative: 9 %
Neutro Abs: 6.3 K/uL (ref 1.7–7.7)
Neutrophils Relative %: 60 %
Platelets: 357 K/uL (ref 150–400)
RBC: 2.68 MIL/uL — ABNORMAL LOW (ref 3.87–5.11)
RDW: 13.3 % (ref 11.5–15.5)
WBC: 10.4 K/uL (ref 4.0–10.5)
nRBC: 0 % (ref 0.0–0.2)

## 2024-09-13 LAB — RENAL FUNCTION PANEL
Albumin: 2 g/dL — ABNORMAL LOW (ref 3.5–5.0)
Anion gap: 16 — ABNORMAL HIGH (ref 5–15)
BUN: 54 mg/dL — ABNORMAL HIGH (ref 6–20)
CO2: 24 mmol/L (ref 22–32)
Calcium: 8.1 mg/dL — ABNORMAL LOW (ref 8.9–10.3)
Chloride: 94 mmol/L — ABNORMAL LOW (ref 98–111)
Creatinine, Ser: 8.77 mg/dL — ABNORMAL HIGH (ref 0.44–1.00)
GFR, Estimated: 5 mL/min — ABNORMAL LOW (ref >60)
Glucose, Bld: 154 mg/dL — ABNORMAL HIGH (ref 70–99)
Phosphorus: 6.4 mg/dL — ABNORMAL HIGH (ref 2.5–4.6)
Potassium: 4.7 mmol/L (ref 3.5–5.1)
Sodium: 134 mmol/L — ABNORMAL LOW (ref 135–145)

## 2024-09-13 LAB — GLUCOSE, CAPILLARY
Glucose-Capillary: 127 mg/dL — ABNORMAL HIGH (ref 70–99)
Glucose-Capillary: 150 mg/dL — ABNORMAL HIGH (ref 70–99)
Glucose-Capillary: 160 mg/dL — ABNORMAL HIGH (ref 70–99)
Glucose-Capillary: 188 mg/dL — ABNORMAL HIGH (ref 70–99)

## 2024-09-13 LAB — HEPATITIS B SURFACE ANTIGEN: Hepatitis B Surface Ag: NONREACTIVE

## 2024-09-13 MED ORDER — CIPROFLOXACIN HCL 500 MG PO TABS
500.0000 mg | ORAL_TABLET | Freq: Every day | ORAL | Status: DC
  Administered 2024-09-13: 500 mg via ORAL
  Filled 2024-09-13: qty 1

## 2024-09-13 MED ORDER — CIPROFLOXACIN HCL 500 MG PO TABS
500.0000 mg | ORAL_TABLET | Freq: Every day | ORAL | 0 refills | Status: DC
  Filled 2024-09-13: qty 4, 4d supply, fill #0

## 2024-09-13 MED ORDER — METHOCARBAMOL 750 MG PO TABS
750.0000 mg | ORAL_TABLET | Freq: Three times a day (TID) | ORAL | 0 refills | Status: DC | PRN
  Filled 2024-09-13: qty 15, 5d supply, fill #0

## 2024-09-13 MED ORDER — KETOCONAZOLE 2 % EX SHAM
1.0000 | MEDICATED_SHAMPOO | CUTANEOUS | 0 refills | Status: DC
  Filled 2024-09-13: qty 120, 14d supply, fill #0

## 2024-09-13 MED ORDER — CIPROFLOXACIN HCL 500 MG PO TABS: 250.0000 mg | ORAL_TABLET | Freq: Two times a day (BID) | ORAL | Status: DC

## 2024-09-13 NOTE — Progress Notes (Signed)
 PHARMACY NOTE:  ANTIMICROBIAL RENAL DOSAGE ADJUSTMENT  Current antimicrobial regimen includes a mismatch between antimicrobial dosage and estimated renal function.  As per policy approved by the Pharmacy & Therapeutics and Medical Executive Committees, the antimicrobial dosage will be adjusted accordingly.  Current antimicrobial dosage:  ciprofloxacin  250mg  BID  Indication: wound  Renal Function:  Estimated Creatinine Clearance: 8.6 mL/min (A) (by C-G formula based on SCr of 8.77 mg/dL (H)). []      On intermittent HD, scheduled: []      On CRRT    Antimicrobial dosage has been changed to:  ciprofloxacin  500mg  daily  Additional comments:   Thank you for allowing pharmacy to be a part of this patient's care.  Karen [name] [place], [facility] 09/13/2024 1:16 PM

## 2024-09-13 NOTE — Discharge Summary (Signed)
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 test that has been validated and its performance characteristics determined by the reporting laboratory.  This laboratory is certified under the Clinical Laboratory Improvement Amendments CLIA as qualified  to perform high complexity clinical laboratory testing.    CEFEPIME Value in next row Sensitive      <=4 SENSITIVEThis is a modified FDA-approved test that has been validated and its performance characteristics determined by the reporting laboratory.  This laboratory is certified under the Clinical Laboratory Improvement Amendments CLIA as qualified to perform high complexity clinical laboratory testing.    ERTAPENEM Value in next row  Sensitive      <=4 SENSITIVEThis is a modified FDA-approved test that has been validated and its performance characteristics determined by the reporting laboratory.  This laboratory is certified under the Clinical Laboratory Improvement Amendments CLIA as qualified to perform high complexity clinical laboratory testing.    CEFTRIAXONE  Value in next row Sensitive      <=4 SENSITIVEThis is a modified FDA-approved test that has been validated and its performance characteristics determined by the reporting laboratory.  This laboratory is certified under the Clinical Laboratory Improvement Amendments CLIA as qualified to perform high complexity clinical laboratory testing.    CIPROFLOXACIN  Value in next row Sensitive      <=4 SENSITIVEThis is a modified FDA-approved test that has been validated and its performance characteristics determined by the reporting laboratory.  This laboratory is certified under the Clinical Laboratory Improvement Amendments CLIA as qualified to perform high complexity clinical laboratory testing.    GENTAMICIN Value in next row Sensitive      <=4 SENSITIVEThis is a modified FDA-approved test that has been validated and its performance characteristics determined by the reporting laboratory.  This laboratory is certified under the Clinical Laboratory Improvement Amendments CLIA as qualified to perform high complexity clinical laboratory testing.    MEROPENEM Value in next row Sensitive      <=4 SENSITIVEThis is a modified FDA-approved test that has been validated and its performance characteristics determined by the reporting laboratory.  This laboratory is certified under the Clinical Laboratory Improvement Amendments CLIA as qualified to perform high complexity clinical laboratory testing.    TRIMETH/SULFA Value in next row Resistant      <=4 SENSITIVEThis is a modified FDA-approved test that has been validated and its performance characteristics determined by the reporting  laboratory.  This laboratory is certified under the Clinical Laboratory Improvement Amendments CLIA as qualified to perform high complexity clinical laboratory testing.    AMPICILLIN/SULBACTAM Value in next row Sensitive      <=4 SENSITIVEThis is a modified FDA-approved test that has been validated and its performance characteristics determined by the reporting laboratory.  This laboratory is certified under the Clinical Laboratory Improvement Amendments CLIA as qualified to perform high complexity clinical laboratory testing.    PIP/TAZO Value in next row Sensitive      <=4 SENSITIVEThis is a modified FDA-approved test that has been validated and its performance characteristics determined by the reporting laboratory.  This laboratory is certified under the Clinical Laboratory Improvement Amendments CLIA as qualified to perform high complexity clinical laboratory testing.    * ABUNDANT PROTEUS MIRABILIS    Labs: CBC: Recent Labs  Lab 09/08/24 1510 09/10/24 0450 09/11/24 0500 09/12/24 0416 09/13/24 0555  WBC 11.7* 10.3 7.1 8.6 10.4  NEUTROABS 8.4* 7.6 3.8 5.3 6.3  HGB 8.7* 8.1* 8.7* 7.8* 7.9*  HCT 27.7* 25.2* 27.6* 24.8* 24.9*  MCV 94.2 92.3 94.8 94.3 92.9  PLT 331 309 332 297 357   Basic Metabolic Panel: Recent Labs  Lab 09/06/24 1750 09/07/24 0609 09/10/24 0450 09/11/24 0500 09/12/24 0416 09/13/24 0555  NA 137 135 132* 135 131*  134*  K 4.7 4.9 4.8 4.3 4.6 4.7  CL 96* 97* 91* 95* 91* 94*  CO2 26 24 24 27 27 24   GLUCOSE 123* 156* 180* 255* 360* 154*  BUN 30* 38* 51* 27* 42* 54*  CREATININE 5.96* 6.87* 7.97* 5.18* 6.87* 8.77*  CALCIUM  8.8* 8.4* 8.1* 7.8* 7.8* 8.1*  MG 2.2 2.0  --   --   --   --   PHOS  --   --  5.3* 4.5 4.9* 6.4*   Liver Function Tests: Recent Labs  Lab 09/06/24 1750 09/07/24 0609 09/10/24 0450 09/11/24 0500 09/12/24 0416 09/13/24 0555  AST 30 18  --   --   --   --   ALT 14 11  --   --   --   --   ALKPHOS 266* 205*  --   --   --   --   BILITOT  0.6 0.9  --   --   --   --   PROT 7.8 6.3*  --   --   --   --   ALBUMIN  3.1* 2.4* 2.2* 2.2* 2.0* 2.0*   CBG: Recent Labs  Lab 09/12/24 2003 09/13/24 0043 09/13/24 0455 09/13/24 1056 09/13/24 1135  GLUCAP 278* 188* 160* 127* 150*    Discharge time spent: greater than 30 minutes.  Signed: Lebron JINNY Cage, MD [facility] 09/13/2024

## 2024-09-13 NOTE — Progress Notes (Signed)
 I flushed this patients PD catheter with 1.5%--total of 1 liter The drain was a full 1 liter returned and the the color is clear light yellow--no sediment or fibrin or blood assessed- DSD applied per protocol to the PD catheter site---exit site is without any s/s of any reddness or drainage---

## 2024-09-13 NOTE — TOC CM/SW Note (Signed)
    Durable Medical Equipment  (From admission, onward)           Start     Ordered   09/12/24 1624  For home use only DME Bedside commode  Once       Question:  Patient needs a bedside commode to treat with the following condition  Answer:  Diabetic ulcer of left foot (HCC)   09/12/24 1625   09/11/24 1613  For home use only DME Walker rolling  Once       Question Answer Comment  Walker: With 5 Inch Wheels   Patient needs a walker to treat with the following condition Diabetic ulcer of left foot (HCC)      09/11/24 1612

## 2024-09-13 NOTE — Plan of Care (Signed)

## 2024-09-13 NOTE — Progress Notes (Signed)
 [facility] Progress Note   Subjective:    Seen and examined patient on HD. Tolerating tx with no acute issues. Plan to flush PD catheter today as well.   Objective Vitals:   09/13/24 1000 09/13/24 1030 09/13/24 1100 09/13/24 1104  BP: (!) 167/65 (!) 149/62 (!) 101/47 (!) 121/57  Pulse: 74 75 71 71  Resp: 17 15 15 10   Temp:    97.7 F (36.5 C)  TempSrc:      SpO2: 100% 100% 99% 100%  Weight:    87.6 kg  Height:       Physical Exam General: Well appearing, NAD Heart: RRR Lungs: CTA anteriorly Abdomen: soft, PD cath in place Extremities: no edema, feet bandaged Dialysis Access: LUE AVF + t/b   Filed Weights   09/13/24 0455 09/13/24 0747 09/13/24 1104  Weight: 90.1 kg 90.1 kg 87.6 kg    Intake/Output Summary (Last 24 hours) at 09/13/2024 1117 Last data filed at 09/13/2024 1104 Gross per 24 hour  Intake --  Output 2500 ml  Net -2500 ml    Additional Objective Labs: Basic Metabolic Panel: Recent Labs  Lab 09/11/24 0500 09/12/24 0416 09/13/24 0555  NA 135 131* 134*  K 4.3 4.6 4.7  CL 95* 91* 94*  CO2 27 27 24   GLUCOSE 255* 360* 154*  BUN 27* 42* 54*  CREATININE 5.18* 6.87* 8.77*  CALCIUM  7.8* 7.8* 8.1*  PHOS 4.5 4.9* 6.4*   Liver Function Tests: Recent Labs  Lab 09/06/24 1750 09/07/24 0609 09/10/24 0450 09/11/24 0500 09/12/24 0416 09/13/24 0555  AST 30 18  --   --   --   --   ALT 14 11  --   --   --   --   ALKPHOS 266* 205*  --   --   --   --   BILITOT 0.6 0.9  --   --   --   --   PROT 7.8 6.3*  --   --   --   --   ALBUMIN  3.1* 2.4*   < > 2.2* 2.0* 2.0*   < > = values in this interval not displayed.   No results for input(s): LIPASE, AMYLASE in the last 168 hours. CBC: Recent Labs  Lab 09/08/24 1510 09/10/24 0450 09/11/24 0500 09/12/24 0416 09/13/24 0555  WBC 11.7* 10.3 7.1 8.6 10.4  NEUTROABS 8.4* 7.6 3.8 5.3 6.3  HGB 8.7* 8.1* 8.7* 7.8* 7.9*  HCT 27.7* 25.2* 27.6* 24.8* 24.9*  MCV 94.2 92.3 94.8 94.3 92.9  PLT  331 309 332 297 357   Blood Culture    Component Value Date/Time   SDES TISSUE 09/10/2024 0800   SPECREQUEST LEFT FOREFOOT 09/10/2024 0800   CULT  09/10/2024 0800    ABUNDANT MORGANELLA MORGANII ABUNDANT PROTEUS MIRABILIS NO ANAEROBES ISOLATED; CULTURE IN PROGRESS FOR 5 DAYS    REPTSTATUS PENDING 09/10/2024 0800    Cardiac Enzymes: No results for input(s): CKTOTAL, CKMB, CKMBINDEX, TROPONINI in the last 168 hours. CBG: Recent Labs  Lab 09/12/24 1654 09/12/24 2003 09/13/24 0043 09/13/24 0455 09/13/24 1056  GLUCAP 137* 278* 188* 160* 127*   Iron Studies: No results for input(s): IRON, TIBC, TRANSFERRIN, FERRITIN in the last 72 hours. Lab Results  Component Value Date   INR 1.1 09/06/2024   Studies/Results: No results found.  Medications:  piperacillin -tazobactam (ZOSYN )  IV 2.25 g (09/13/24 0038)   vancomycin  Stopped (09/10/24 1937)    (feeding supplement) PROSource Plus  30 mL Oral BID BM  aspirin  EC  81 mg Oral Daily   brimonidine  1 drop Both Eyes BID   Chlorhexidine  Gluconate Cloth  6 each Topical Q0600   cinacalcet   90 mg Oral Q breakfast   clopidogrel   75 mg Oral Daily   [START ON 09/17/2024] darbepoetin (ARANESP ) injection - DIALYSIS  60 mcg Subcutaneous Q Fri   gabapentin   200 mg Oral q AM   And   gabapentin   100 mg Oral QHS   gentamicin cream  1 Application Topical Daily   insulin  aspart  0-5 Units Subcutaneous QHS   insulin  aspart  0-9 Units Subcutaneous Q4H   insulin  aspart  3 Units Subcutaneous TID WC   insulin  glargine  5 Units Subcutaneous BID   sodium chloride  flush  10-40 mL Intracatheter Q12H   sodium chloride  flush  3 mL Intravenous Q12H    Dialysis Orders: MWF - [facility], BFR 400, EDW 90kg, 2K/2.5Ca bath, AVF, no heparin   Assessment/Plan: 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 On IV abx. ESRD: Continue HD on MWF schedule - on HD.  PD cath in place: Hasn't started training  yet - continue flushes 2d/week (Mon/Fri).  HTN/volume: BP stable, below prior EDW - lower on discharge. Anemia of ESRD: Hgb 7.8 - Aranesp  ordered, pharmacy moved start date to 10/17 - follow. Secondary HPTH: CorrCa/Phos ok - on Fosrenol  powder at home, not on formulary here and refusing alt binder. Continue cinacalcet . Nutrition: Alb low, continue protein supps. T2DM: Insulin  per primary.  Karen Piety, NP [facility] 09/13/2024,11:17 AM  LOS: 7 days

## 2024-09-13 NOTE — Progress Notes (Signed)
 Physical Therapy Treatment Patient Details Name: Karen [name] MRN: [id number] DOB: 03-16-1976 Today's Date: 09/13/2024   History of Present Illness Karen [name] is a 48 y.o. female admitted on 09/06/24 with infected left diabetic foot wound. Now s/p left foot transmetatarsal amputation, and right third toe partial amputation on 09/10/2024, recommend 7 days p.o. antibiotics, NWB LLE, okay to WBAT on right foot in postop shoe. 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 Pt was able to stand to rW and maintain NWB left LE to pivot to 3N1 and back to bed with min assist. Pt states she is ready to go home and has 24 hour care. Going to her sisters so that she wont have steps.  Equipment recommended as below.  HHPT f/u recommended. Will continue to follow acutely.  Pt currently with functional limitations due to the deficits listed below (see PT Problem List). Pt will benefit from acute skilled PT to increase their independence and safety with mobility to allow discharge.       If plan is discharge home, recommend the following: A little help with walking and/or transfers;A little help with bathing/dressing/bathroom;Help with stairs or ramp for entrance;Assist for transportation;Assistance with cooking/housework   Can travel by private Automotive engineer (2 wheels);BSC/3in1    Recommendations for Other Services       Precautions / Restrictions Precautions Precautions: Fall Recall of Precautions/Restrictions: Intact Restrictions Weight Bearing Restrictions Per Provider Order: Yes RLE Weight Bearing Per Provider Order: Weight bearing as tolerated LLE Weight Bearing Per Provider Order: Non weight bearing Other Position/Activity Restrictions: WBAT R foot with post op shoe,  NWB LLE with post op shoe     Mobility  Bed Mobility Overal bed mobility: Modified Independent Bed Mobility: Supine to Sit     Supine to sit: Modified independent (Device/Increase time)          Transfers Overall transfer level: Needs assistance Equipment used: Rolling walker (2 wheels) Transfers: Sit to/from Stand Sit to Stand: From elevated surface, Min assist, Contact guard assist           General transfer comment: min assist from low surfaces and CGA from elevated surfaces.  Cues for hand placement. Mild instability. Able to maintain NWB left LE. Pt was able to pivot to her left with RW to get to 3N1.  total assist to be cleaned. Pt then back to her right with RW to get back into bed with min assist and cues. Pt fatigues quickly.    Ambulation/Gait Ambulation/Gait assistance: Min assist Gait Distance (Feet): 2 Feet Assistive device: Rolling walker (2 wheels) Gait Pattern/deviations: Step-to pattern, Decreased dorsiflexion - right   Gait velocity interpretation: <1.31 ft/sec, indicative of household ambulator   General Gait Details: hop to gait pattern, cues for sequencing, able to maintain L LE NWB, poor tolerance for activity with pt taking a few steps to [facility] and needing min assist for safety.   Stairs             Wheelchair Mobility     Tilt Bed    Modified Rankin (Stroke Patients Only)       Balance Overall balance assessment: Needs assistance Sitting-balance support: No upper extremity supported Sitting balance-Leahy Scale: Good     Standing balance support: Reliant on assistive device for  balance, Bilateral upper extremity supported, During functional activity Standing balance-Leahy Scale: Poor Standing balance comment: Assistance of device secondary to L LE NWB. As pt fatigues, requiring minA for stability and walker stabilization                            Communication Communication Communication: No apparent difficulties   Cognition Arousal: Alert Behavior During Therapy: WFL for tasks assessed/performed   PT - Cognitive impairments: No apparent impairments                         Following commands: Intact      Cueing Cueing Techniques: Verbal cues, Tactile cues  Exercises      General Comments General comments (skin integrity, edema, etc.): Education provided to patient on safe mobility upon discharge. Patient agreed that discharge to sister's home at this time would be safest to avoid stairs until her ramp has been built. Discussed taking w/c into bathroom, completing stand pivot transfers with 2WW and caregiver assist.      Pertinent Vitals/Pain Pain Assessment Pain Assessment: Faces Faces Pain Scale: Hurts little more Pain Location: back, bil feet Pain Descriptors / Indicators: Aching, Sharp Pain Intervention(s): Limited activity within patient's tolerance, Monitored during session, Repositioned    Home Living                          Prior Function            PT Goals (current goals can now be found in the care plan section) Acute Rehab PT Goals Patient Stated Goal: to return home as soon as possible Progress towards PT goals: Progressing toward goals    Frequency    Min 2X/week      PT Plan      Co-evaluation              AM-PAC PT 6 Clicks Mobility   Outcome Measure  Help needed turning from your back to your side while in a flat bed without using bedrails?: None Help needed moving from lying on your back to sitting on the side of a flat bed without using bedrails?: None Help needed moving to and from a bed to a chair (including a wheelchair)?: A Little Help needed standing up from a chair using your arms (e.g., wheelchair or bedside chair)?: A Little Help needed to walk in hospital room?: Total Help needed climbing 3-5 steps with a railing? : Total 6 Click Score: 16    End of Session Equipment Utilized During Treatment: Gait belt;Other  (comment) (bilateral post op shoes) Activity Tolerance: Patient tolerated treatment well;Patient limited by fatigue Patient left: with bed alarm set;with call bell/phone within reach;in bed Nurse Communication: Mobility status;Other (comment) (discharge recommendations) PT Visit Diagnosis: Unsteadiness on feet (R26.81);Other abnormalities of gait and mobility (R26.89);Muscle weakness (generalized) (M62.81);Difficulty in walking, not elsewhere classified (R26.2)     Time: 8584-8553 PT Time Calculation (min) (ACUTE ONLY): 31 min  Charges:    $Gait Training: 23-37 mins PT General Charges $$ ACUTE PT VISIT: 1 Visit                     Mackenzie Groom M,PT Acute Rehab Services [phone]    Stephane JULIANNA Bevel 09/13/2024, 4:13 PM

## 2024-09-13 NOTE — Progress Notes (Signed)
 PT Cancellation Note  Patient Details Name: Karen [name] MRN: [id number] DOB: [birth year]   Cancelled Treatment:    Reason Eval/Treat Not Completed: Patient at procedure or test/unavailable (Pt in HD. Will return as able.)   Stephane JULIANNA Bevel 09/13/2024, 9:13 AM Forest Pruden M,PT Acute Rehab Services [phone]

## 2024-09-13 NOTE — Progress Notes (Signed)
RA DL PICC removed per protocol per MD order. Manual pressure applied for 3 mins. Vaseline gauze, gauze, and Tegaderm applied over insertion site. No bleeding or swelling noted. Instructed patient to remain in bed for thirty mins. Educated patient about S/S of infection and when to call MD; no heavy lifting or pressure on right side for 24 hours; keep dressing dry and intact for 24 hours. Pt verbalized comprehension.

## 2024-09-13 NOTE — TOC Transition Note (Signed)
 Transition of Care [facility]) - Discharge Note   Patient Details  Name: Karen [name] MRN: [id number] Date of Birth: May 05, 1976  [facility] CM/SW Contact:  Sudie Erminio Deems, RN Phone Number: 09/13/2024, 4:31 PM   Clinical Narrative: Plan is for patient to transition home today. Patient states her best friend will transport her home via private vehicle. Patient is in need of [facility] PT/RN for dressing changes. Patient did not have a preference; just to be in network. HH referral submitted to [facility] and they have accepted the patient. Start of Care to begin within 24-48 hours post transition home. Patient states she will be staying with her sister @ [address] [place] [address]. DME ordered via [employer] since patient has [facility] HMO. DME will be delivered to the room before discharge. No further needs identified at this time.   Final next level of care: Home w Home Health Services Barriers to Discharge: No Barriers Identified   Patient Goals and CMS Choice Patient states their goals for this hospitalization and ongoing recovery are:: Plans to return home with family support.   Choice offered to / list presented to : Patient (Patient has no preference; just to be in network with insurance.) Discharge Plan and Services Additional resources added to the After Visit Summary for   In-house Referral: NA Discharge Planning Services: CM Consult Post Acute Care Choice: Durable Medical Equipment, Home Health            DME Agency: NA       HH Arranged: RN, Disease Management, PT HH Agency: [facility] Date [facility] Agency Contacted: 09/13/24 Time HH Agency Contacted: 1630 Representative spoke with at [facility] Agency: [place]  Social Drivers of Health (SDOH) Interventions SDOH Screenings   Food Insecurity: No Food Insecurity (09/07/2024)  Recent Concern: Food Insecurity - Food Insecurity Present (08/25/2024)   Received from  [facility]  Housing: Low Risk  (09/07/2024)  Transportation Needs: No Transportation Needs (09/07/2024)  Utilities: Not At Risk (09/07/2024)  Financial Resource Strain: Medium Risk (07/28/2024)   Received from [facility]  Physical Activity: Inactive (07/28/2024)   Received from [facility]  Social Connections: Socially Integrated (07/28/2024)   Received from [facility]  Stress: No Stress Concern Present (08/25/2024)   Received from [facility]  Tobacco Use: Medium Risk (09/10/2024)   Readmission Risk Interventions     No data to display

## 2024-09-13 NOTE — Progress Notes (Signed)
 Subjective:  Patient ID: Karen [name], female    DOB: 1976-09-07,  MRN: [id number]  Chief Complaint  Patient presents with   Wound Check    DOS: 09/10/2024 Procedure: 1. Amputation of right third toe at PIPJ level 2. Transmetatarsal amputation of left foot  48 y.o. female seen for post op check.  Saw the patient today.  She is anxious regarding the appearance of her amputation site on the bilateral foot.  More so on the left than the right.  She says her pain is somewhat controlled at this time.  We discussed the concern about the nonhealing amputation site on the left.  Patient states she would prefer to go home as soon as is possible.  Review of Systems: Negative except as noted in the HPI. Denies N/V/F/Ch.   Objective:   Constitutional Well developed. Well nourished.  Vascular Foot warm and well perfused. Capillary refill normal to all digits.   No calf pain with palpation  Neurologic Normal speech. Oriented to person, place, and time. Epicritic sensation diminished to bilateral forefoot  Dermatologic Right foot amputation site at the third toe PIPJ level is doing very well healing well with no dehiscence and no drainage no erythema and no necrosis.    The left foot transmetatarsal site is well coapted with no maceration or drainage.  No evidence of erythema or residual infection however there is significant necrotic changes along the amputation site especially at the lateral aspect.  The plantar flap is significantly dusky and has purple-black discoloration.    Orthopedic: Status post left foot transmetatarsal amputation, status post right partial third toe amputation   Radiographs: Right foot postop: 1. Interval surgical amputation of the third middle and distal phalanges. 2. Stable postsurgical changes involving the fifth metatarsal. 3. Stable chronic changes of the midfoot. 4. Vascular calcifications.  Left foot postop: 1. Status post amputation of all five  toes at the mid metatarsal level. 2. Vascular calcifications.    Pathology: In process  Micro:   ABUNDANT MORGANELLA MORGANII ABUNDANT PROTEUS MIRABILIS    Assessment:   1. Diabetic foot infection (HCC)   2. 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  POD # 3 s/p left foot transmetatarsal amputation and partial right third toe amputation.  -Progressing unfortunately with necrosis of the amputation site on the left foot at the transmet level. -I discussed with vascular surgery and there is significant concern she will not be able to heal at that level.  Unfortunately due to significant necrosis and duskiness of the plantar flap she is likely not a candidate for revision in the midfoot level.  Option will be for outpatient demarcation versus inpatient proximal amputation, though likely will require some level of proximal limb amputation within the near future.  Currently no evidence of infection.  Vascular to see the patient and to discuss further appreciate recommendations. -XR: Expected postop changes -WB Status: Nonweightbearing to left foot in soft dressing.  Weightbearing as tolerated to right foot in postop shoe -Sutures: Remain intact. -Medications/ABX: Switch IV antibiotics to 5 days ciprofloxacin  250 mg twice daily -Dressing: Will place orders for home health patient going home otherwise continue with dry gauze dressing changes twice weekly bilateral foot next on Thursday. - F/u Plan: Patient will follow-up in the office in 2 weeks if going home will  have office call to arrange.        [name], DPM [facility]

## 2024-09-13 NOTE — Progress Notes (Signed)
   09/13/24 1104  Vitals  Temp 97.7 F (36.5 C)  Pulse Rate 71  Resp 10  BP (!) 121/57  SpO2 100 %  O2 Device Room Air  Weight 87.6 kg  Type of Weight Pre-Dialysis  Oxygen Therapy  Patient Activity (if Appropriate) In bed  Pulse Oximetry Type Continuous  Oximetry Probe Site Changed No  Post Treatment  Dialyzer Clearance Lightly streaked  Hemodialysis Intake (mL) 0 mL  Liters Processed 63  Fluid Removed (mL) 2500 mL  Tolerated HD Treatment Yes  AVG/AVF Arterial Site Held (minutes) 10 minutes  AVG/AVF Venous Site Held (minutes) 10 minutes   Received patient in bed to unit.  Alert and oriented.  Informed consent signed and in chart.   TX duration:3.0 per md order for today  Patient tolerated well.  Transported back to the room  Alert, without acute distress.  Hand-off given to patient's nurse.    Access used: LUAF Access issues: no complications  Total UF removed: 2500 Medication(s) given: none   Delon LITTIE [facility]

## 2024-09-13 NOTE — Progress Notes (Signed)
 D/C order noted. Contacted [facility] to be advised of pt's d/c today. Clinic has access to [facility] and can obtain needed clinicals. Also contacted PD staff at [facility] to make them aware pt will d/c today and had PD cath flushed today prior to d/c. PD staff will reach out to pt to schedule next flush if pt does not contact PD staff. No clinicals needed by PD staff.   Randine Mungo Dialysis Navigator [phone]

## 2024-09-14 LAB — SURGICAL PATHOLOGY

## 2024-09-16 ENCOUNTER — Emergency Department (HOSPITAL_COMMUNITY)

## 2024-09-16 ENCOUNTER — Inpatient Hospital Stay (HOSPITAL_COMMUNITY)
Admission: EM | Admit: 2024-09-16 | Discharge: 2024-09-29 | DRG: 239 | Disposition: A | Discharge status: Home Health | Attending: Internal Medicine | Admitting: Internal Medicine

## 2024-09-16 ENCOUNTER — Other Ambulatory Visit: Payer: Self-pay

## 2024-09-16 ENCOUNTER — Encounter (HOSPITAL_COMMUNITY): Payer: Self-pay

## 2024-09-16 DIAGNOSIS — E119 Type 2 diabetes mellitus without complications: Secondary | ICD-10-CM

## 2024-09-16 DIAGNOSIS — M1990 Unspecified osteoarthritis, unspecified site: Secondary | ICD-10-CM | POA: Diagnosis present

## 2024-09-16 DIAGNOSIS — Z8249 Family history of ischemic heart disease and other diseases of the circulatory system: Secondary | ICD-10-CM | POA: Diagnosis not present

## 2024-09-16 DIAGNOSIS — Z59868 Other specified financial insecurity: Secondary | ICD-10-CM

## 2024-09-16 DIAGNOSIS — F419 Anxiety disorder, unspecified: Secondary | ICD-10-CM | POA: Diagnosis present

## 2024-09-16 DIAGNOSIS — L089 Local infection of the skin and subcutaneous tissue, unspecified: Secondary | ICD-10-CM | POA: Diagnosis present

## 2024-09-16 DIAGNOSIS — A419 Sepsis, unspecified organism: Principal | ICD-10-CM | POA: Diagnosis present

## 2024-09-16 DIAGNOSIS — M86372 Chronic multifocal osteomyelitis, left ankle and foot: Secondary | ICD-10-CM | POA: Diagnosis present

## 2024-09-16 DIAGNOSIS — Z9049 Acquired absence of other specified parts of digestive tract: Secondary | ICD-10-CM

## 2024-09-16 DIAGNOSIS — K5900 Constipation, unspecified: Secondary | ICD-10-CM | POA: Diagnosis present

## 2024-09-16 DIAGNOSIS — I10 Essential (primary) hypertension: Secondary | ICD-10-CM | POA: Diagnosis not present

## 2024-09-16 DIAGNOSIS — Z89432 Acquired absence of left foot: Secondary | ICD-10-CM | POA: Diagnosis not present

## 2024-09-16 DIAGNOSIS — E875 Hyperkalemia: Secondary | ICD-10-CM | POA: Diagnosis present

## 2024-09-16 DIAGNOSIS — E1142 Type 2 diabetes mellitus with diabetic polyneuropathy: Secondary | ICD-10-CM | POA: Diagnosis present

## 2024-09-16 DIAGNOSIS — Z89512 Acquired absence of left leg below knee: Secondary | ICD-10-CM | POA: Diagnosis not present

## 2024-09-16 DIAGNOSIS — Z9641 Presence of insulin pump (external) (internal): Secondary | ICD-10-CM | POA: Diagnosis present

## 2024-09-16 DIAGNOSIS — H5462 Unqualified visual loss, left eye, normal vision right eye: Secondary | ICD-10-CM | POA: Diagnosis present

## 2024-09-16 DIAGNOSIS — Z79899 Other long term (current) drug therapy: Secondary | ICD-10-CM

## 2024-09-16 DIAGNOSIS — R112 Nausea with vomiting, unspecified: Secondary | ICD-10-CM | POA: Diagnosis present

## 2024-09-16 DIAGNOSIS — Z794 Long term (current) use of insulin: Secondary | ICD-10-CM

## 2024-09-16 DIAGNOSIS — Z6833 Body mass index (BMI) 33.0-33.9, adult: Secondary | ICD-10-CM

## 2024-09-16 DIAGNOSIS — E11628 Type 2 diabetes mellitus with other skin complications: Secondary | ICD-10-CM | POA: Diagnosis not present

## 2024-09-16 DIAGNOSIS — I70222 Atherosclerosis of native arteries of extremities with rest pain, left leg: Secondary | ICD-10-CM | POA: Diagnosis not present

## 2024-09-16 DIAGNOSIS — E876 Hypokalemia: Secondary | ICD-10-CM | POA: Diagnosis present

## 2024-09-16 DIAGNOSIS — E1152 Type 2 diabetes mellitus with diabetic peripheral angiopathy with gangrene: Secondary | ICD-10-CM | POA: Diagnosis present

## 2024-09-16 DIAGNOSIS — Z89421 Acquired absence of other right toe(s): Secondary | ICD-10-CM

## 2024-09-16 DIAGNOSIS — G4730 Sleep apnea, unspecified: Secondary | ICD-10-CM | POA: Diagnosis present

## 2024-09-16 DIAGNOSIS — Z833 Family history of diabetes mellitus: Secondary | ICD-10-CM | POA: Diagnosis not present

## 2024-09-16 DIAGNOSIS — A499 Bacterial infection, unspecified: Secondary | ICD-10-CM

## 2024-09-16 DIAGNOSIS — I739 Peripheral vascular disease, unspecified: Secondary | ICD-10-CM | POA: Diagnosis not present

## 2024-09-16 DIAGNOSIS — Z635 Disruption of family by separation and divorce: Secondary | ICD-10-CM

## 2024-09-16 DIAGNOSIS — M25551 Pain in right hip: Secondary | ICD-10-CM | POA: Diagnosis present

## 2024-09-16 DIAGNOSIS — M797 Fibromyalgia: Secondary | ICD-10-CM | POA: Diagnosis present

## 2024-09-16 DIAGNOSIS — R509 Fever, unspecified: Secondary | ICD-10-CM | POA: Diagnosis present

## 2024-09-16 DIAGNOSIS — E1169 Type 2 diabetes mellitus with other specified complication: Secondary | ICD-10-CM | POA: Diagnosis present

## 2024-09-16 DIAGNOSIS — E11319 Type 2 diabetes mellitus with unspecified diabetic retinopathy without macular edema: Secondary | ICD-10-CM | POA: Diagnosis present

## 2024-09-16 DIAGNOSIS — R591 Generalized enlarged lymph nodes: Secondary | ICD-10-CM | POA: Diagnosis present

## 2024-09-16 DIAGNOSIS — Z955 Presence of coronary angioplasty implant and graft: Secondary | ICD-10-CM

## 2024-09-16 DIAGNOSIS — B964 Proteus (mirabilis) (morganii) as the cause of diseases classified elsewhere: Secondary | ICD-10-CM | POA: Diagnosis not present

## 2024-09-16 DIAGNOSIS — Z9862 Peripheral vascular angioplasty status: Secondary | ICD-10-CM | POA: Diagnosis not present

## 2024-09-16 DIAGNOSIS — I70262 Atherosclerosis of native arteries of extremities with gangrene, left leg: Principal | ICD-10-CM | POA: Diagnosis present

## 2024-09-16 DIAGNOSIS — Z87891 Personal history of nicotine dependence: Secondary | ICD-10-CM

## 2024-09-16 DIAGNOSIS — Z90710 Acquired absence of both cervix and uterus: Secondary | ICD-10-CM

## 2024-09-16 DIAGNOSIS — I96 Gangrene, not elsewhere classified: Secondary | ICD-10-CM | POA: Diagnosis not present

## 2024-09-16 DIAGNOSIS — Z5941 Food insecurity: Secondary | ICD-10-CM

## 2024-09-16 DIAGNOSIS — N2581 Secondary hyperparathyroidism of renal origin: Secondary | ICD-10-CM | POA: Diagnosis present

## 2024-09-16 DIAGNOSIS — R911 Solitary pulmonary nodule: Secondary | ICD-10-CM | POA: Diagnosis present

## 2024-09-16 DIAGNOSIS — I120 Hypertensive chronic kidney disease with stage 5 chronic kidney disease or end stage renal disease: Secondary | ICD-10-CM | POA: Diagnosis present

## 2024-09-16 DIAGNOSIS — E66811 Obesity, class 1: Secondary | ICD-10-CM | POA: Diagnosis present

## 2024-09-16 DIAGNOSIS — M79672 Pain in left foot: Secondary | ICD-10-CM | POA: Diagnosis not present

## 2024-09-16 DIAGNOSIS — I2510 Atherosclerotic heart disease of native coronary artery without angina pectoris: Secondary | ICD-10-CM | POA: Diagnosis present

## 2024-09-16 DIAGNOSIS — B952 Enterococcus as the cause of diseases classified elsewhere: Secondary | ICD-10-CM | POA: Diagnosis not present

## 2024-09-16 DIAGNOSIS — Z888 Allergy status to other drugs, medicaments and biological substances status: Secondary | ICD-10-CM

## 2024-09-16 DIAGNOSIS — F909 Attention-deficit hyperactivity disorder, unspecified type: Secondary | ICD-10-CM | POA: Diagnosis present

## 2024-09-16 DIAGNOSIS — Z992 Dependence on renal dialysis: Secondary | ICD-10-CM

## 2024-09-16 DIAGNOSIS — N186 End stage renal disease: Secondary | ICD-10-CM | POA: Diagnosis present

## 2024-09-16 DIAGNOSIS — T8789 Other complications of amputation stump: Secondary | ICD-10-CM | POA: Diagnosis not present

## 2024-09-16 DIAGNOSIS — J45909 Unspecified asthma, uncomplicated: Secondary | ICD-10-CM | POA: Diagnosis present

## 2024-09-16 DIAGNOSIS — E1122 Type 2 diabetes mellitus with diabetic chronic kidney disease: Secondary | ICD-10-CM | POA: Diagnosis present

## 2024-09-16 DIAGNOSIS — Z7902 Long term (current) use of antithrombotics/antiplatelets: Secondary | ICD-10-CM

## 2024-09-16 DIAGNOSIS — K219 Gastro-esophageal reflux disease without esophagitis: Secondary | ICD-10-CM | POA: Diagnosis present

## 2024-09-16 DIAGNOSIS — I998 Other disorder of circulatory system: Secondary | ICD-10-CM | POA: Diagnosis not present

## 2024-09-16 DIAGNOSIS — Z825 Family history of asthma and other chronic lower respiratory diseases: Secondary | ICD-10-CM

## 2024-09-16 DIAGNOSIS — Z7982 Long term (current) use of aspirin: Secondary | ICD-10-CM

## 2024-09-16 DIAGNOSIS — M86649 Other chronic osteomyelitis, unspecified hand: Secondary | ICD-10-CM | POA: Diagnosis not present

## 2024-09-16 DIAGNOSIS — D631 Anemia in chronic kidney disease: Secondary | ICD-10-CM | POA: Diagnosis present

## 2024-09-16 LAB — COMPREHENSIVE METABOLIC PANEL WITH GFR
ALT: 17 U/L (ref 0–44)
AST: 20 U/L (ref 15–41)
Albumin: 2.6 g/dL — ABNORMAL LOW (ref 3.5–5.0)
Alkaline Phosphatase: 293 U/L — ABNORMAL HIGH (ref 38–126)
Anion gap: 19 — ABNORMAL HIGH (ref 5–15)
BUN: 40 mg/dL — ABNORMAL HIGH (ref 6–20)
CO2: 24 mmol/L (ref 22–32)
Calcium: 8.3 mg/dL — ABNORMAL LOW (ref 8.9–10.3)
Chloride: 93 mmol/L — ABNORMAL LOW (ref 98–111)
Creatinine, Ser: 6.7 mg/dL — ABNORMAL HIGH (ref 0.44–1.00)
GFR, Estimated: 7 mL/min — ABNORMAL LOW (ref >60)
Glucose, Bld: 134 mg/dL — ABNORMAL HIGH (ref 70–99)
Potassium: 4.2 mmol/L (ref 3.5–5.1)
Sodium: 136 mmol/L (ref 135–145)
Total Bilirubin: 0.8 mg/dL (ref 0.0–1.2)
Total Protein: 7.5 g/dL (ref 6.5–8.1)

## 2024-09-16 LAB — CBC WITH DIFFERENTIAL/PLATELET
Abs Immature Granulocytes: 0.11 K/uL — ABNORMAL HIGH (ref 0.00–0.07)
Basophils Absolute: 0.1 K/uL (ref 0.0–0.1)
Basophils Relative: 0 %
Eosinophils Absolute: 0.2 K/uL (ref 0.0–0.5)
Eosinophils Relative: 1 %
HCT: 26.8 % — ABNORMAL LOW (ref 36.0–46.0)
Hemoglobin: 8.4 g/dL — ABNORMAL LOW (ref 12.0–15.0)
Immature Granulocytes: 1 %
Lymphocytes Relative: 8 %
Lymphs Abs: 1.6 K/uL (ref 0.7–4.0)
MCH: 29.8 pg (ref 26.0–34.0)
MCHC: 31.3 g/dL (ref 30.0–36.0)
MCV: 95 fL (ref 80.0–100.0)
Monocytes Absolute: 1.4 K/uL — ABNORMAL HIGH (ref 0.1–1.0)
Monocytes Relative: 7 %
Neutro Abs: 16 K/uL — ABNORMAL HIGH (ref 1.7–7.7)
Neutrophils Relative %: 83 %
Platelets: 511 K/uL — ABNORMAL HIGH (ref 150–400)
RBC: 2.82 MIL/uL — ABNORMAL LOW (ref 3.87–5.11)
RDW: 13.7 % (ref 11.5–15.5)
WBC: 19.4 K/uL — ABNORMAL HIGH (ref 4.0–10.5)
nRBC: 0 % (ref 0.0–0.2)

## 2024-09-16 LAB — GLUCOSE, CAPILLARY: Glucose-Capillary: 161 mg/dL — ABNORMAL HIGH (ref 70–99)

## 2024-09-16 LAB — PROTIME-INR
INR: 1.1 (ref 0.8–1.2)
Prothrombin Time: 15.1 s (ref 11.4–15.2)

## 2024-09-16 LAB — LIPASE, BLOOD: Lipase: 23 U/L (ref 11–51)

## 2024-09-16 LAB — I-STAT CG4 LACTIC ACID, ED
Lactic Acid, Venous: 1.7 mmol/L (ref 0.5–1.9)
Lactic Acid, Venous: 0.9 mmol/L (ref 0.5–1.9)

## 2024-09-16 MED ORDER — SODIUM CHLORIDE 0.9 % IV SOLN
1.0000 g | INTRAVENOUS | Status: DC
  Administered 2024-09-17 – 2024-09-22 (×6): 1 g via INTRAVENOUS
  Filled 2024-09-16: qty 10
  Filled 2024-09-16: qty 1
  Filled 2024-09-16 (×5): qty 10

## 2024-09-16 MED ORDER — METOPROLOL TARTRATE 12.5 MG HALF TABLET
12.5000 mg | ORAL_TABLET | ORAL | Status: DC
  Administered 2024-09-16 – 2024-09-28 (×14): 12.5 mg via ORAL
  Filled 2024-09-16 (×16): qty 1

## 2024-09-16 MED ORDER — ASPIRIN 81 MG PO TBEC
81.0000 mg | DELAYED_RELEASE_TABLET | Freq: Every day | ORAL | Status: DC
Start: 2024-09-17 — End: 2024-09-20
  Administered 2024-09-17 – 2024-09-19 (×3): 81 mg via ORAL
  Filled 2024-09-16 (×4): qty 1

## 2024-09-16 MED ORDER — INSULIN PUMP
Freq: Three times a day (TID) | SUBCUTANEOUS | Status: DC
  Administered 2024-09-20: 3.1 via SUBCUTANEOUS
  Administered 2024-09-21: 6.28 via SUBCUTANEOUS
  Administered 2024-09-22: 6.32 via SUBCUTANEOUS
  Administered 2024-09-23: 1.23 via SUBCUTANEOUS
  Administered 2024-09-29: 6.4 via SUBCUTANEOUS
  Filled 2024-09-16: qty 1

## 2024-09-16 MED ORDER — GABAPENTIN 100 MG PO CAPS
100.0000 mg | ORAL_CAPSULE | Freq: Every day | ORAL | Status: DC
  Administered 2024-09-16 – 2024-09-28 (×12): 100 mg via ORAL
  Filled 2024-09-16 (×12): qty 1

## 2024-09-16 MED ORDER — SODIUM CHLORIDE 0.9 % IV SOLN
2.0000 g | Freq: Once | INTRAVENOUS | Status: AC
  Administered 2024-09-16: 2 g via INTRAVENOUS
  Filled 2024-09-16: qty 12.5

## 2024-09-16 MED ORDER — GABAPENTIN 100 MG PO CAPS
200.0000 mg | ORAL_CAPSULE | Freq: Every day | ORAL | Status: DC
  Administered 2024-09-17 – 2024-09-29 (×10): 200 mg via ORAL
  Filled 2024-09-16 (×13): qty 2

## 2024-09-16 MED ORDER — FENTANYL CITRATE (PF) 50 MCG/ML IJ SOSY
25.0000 ug | PREFILLED_SYRINGE | Freq: Once | INTRAMUSCULAR | Status: AC
  Administered 2024-09-16: 25 ug via INTRAVENOUS
  Filled 2024-09-16: qty 1

## 2024-09-16 MED ORDER — ACETAMINOPHEN 325 MG PO TABS
650.0000 mg | ORAL_TABLET | Freq: Four times a day (QID) | ORAL | Status: DC | PRN
  Filled 2024-09-16 (×2): qty 2

## 2024-09-16 MED ORDER — VANCOMYCIN HCL 2000 MG/400ML IV SOLN
2000.0000 mg | Freq: Once | INTRAVENOUS | Status: AC
  Administered 2024-09-16: 2000 mg via INTRAVENOUS
  Filled 2024-09-16: qty 400

## 2024-09-16 MED ORDER — BRIMONIDINE TARTRATE 0.2 % OP SOLN
1.0000 [drp] | Freq: Three times a day (TID) | OPHTHALMIC | Status: DC
  Administered 2024-09-16 – 2024-09-29 (×28): 1 [drp] via OPHTHALMIC
  Filled 2024-09-16: qty 5

## 2024-09-16 MED ORDER — CINACALCET HCL 30 MG PO TABS
90.0000 mg | ORAL_TABLET | Freq: Every day | ORAL | Status: DC
  Administered 2024-09-17 – 2024-09-29 (×10): 90 mg via ORAL
  Filled 2024-09-16 (×11): qty 3

## 2024-09-16 MED ORDER — CLOPIDOGREL BISULFATE 75 MG PO TABS
75.0000 mg | ORAL_TABLET | Freq: Every day | ORAL | Status: DC
  Administered 2024-09-17 – 2024-09-19 (×3): 75 mg via ORAL
  Filled 2024-09-16 (×4): qty 1

## 2024-09-16 MED ORDER — ACETAMINOPHEN 650 MG RE SUPP: 650.0000 mg | Freq: Four times a day (QID) | RECTAL | Status: DC | PRN

## 2024-09-16 NOTE — Progress Notes (Signed)
 Pharmacy Antibiotic Note  Karen [name] is a 49 y.o. female for which pharmacy has been consulted for cefepime and vancomycin  dosing for sepsis. 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 Wt 87.6 kg.   Plan: Vancomycin  2g once given in the ED. Further dosing pending dialysis schedule Cefepime 2g given in the ED. Continue with Cefepime 1g q24hr  Monitor WBC, fever, renal function, cultures De-escalate when able F/u Nephrology plan  Height: 5' 5 (165.1 cm) Weight: 87.6 kg (193 lb 2 oz) IBW/kg (Calculated) : 57  Temp (24hrs), Avg:100.2 F (37.9 C), Min:100.2 F (37.9 C), Max:100.2 F (37.9 C)  Recent Labs  Lab 09/10/24 0450 09/11/24 0500 09/12/24 0416 09/13/24 0555 09/16/24 1747 09/16/24 1759 09/16/24 2011  WBC 10.3 7.1 8.6 10.4 19.4*  --   --   CREATININE 7.97* 5.18* 6.87* 8.77* 6.70*  --   --   LATICACIDVEN  --   --   --   --   --  1.7 0.9    Estimated Creatinine Clearance: 11.2 mL/min (A) (by C-G formula based on SCr of 6.7 mg/dL (H)).    Allergies  Allergen Reactions   Furosemide Swelling   Heparin  Other (See Comments)    Patient relates vitreous hemorrhage after heparin . Patient relates vitreous hemorrhage after heparin .   Microbiology results: Pending  Thank you for allowing pharmacy to be a part of this patient's care.  Dorn Buttner, PharmD, BCPS 09/16/2024 9:47 PM ED Clinical Pharmacist -  [phone]

## 2024-09-16 NOTE — ED Provider Notes (Signed)
 [place] EMERGENCY DEPARTMENT AT [facility] Provider Note   CSN: [id number] Arrival date & time: 09/16/24  1705     Patient presents with: Post-op Problem   Karen [name] is a 48 y.o. female.   This is a 48 year old female presenting the emergency department for fevers and chills and generalized malaise.  Recent toe amputations.  Has been on antibiotics.  For the past day or so she notes increasing pain to her bilateral feet.  She notes fevers as well.  She has been nauseated and vomiting.  Not having abdominal pain.  Does not make urine.        Prior to Admission medications   Medication Sig Start Date End Date Taking? Authorizing Provider  albuterol  (VENTOLIN  HFA) 108 (90 Base) MCG/ACT inhaler Inhale 1-2 puffs into the lungs every 6 (six) hours as needed for wheezing or shortness of breath.    [provider]  aspirin  EC 81 MG tablet Take 1 tablet (81 mg total) by mouth daily. Swallow whole. 02/26/24   Wyn Jackee VEAR Mickey., NP  B Complex-C-Folic Acid  (RENA-VITE PO) Take 1 tablet by mouth in the morning.    [provider]  brimonidine (ALPHAGAN) 0.2 % ophthalmic solution Place 1 drop into both eyes in the morning and at bedtime. 01/29/24   [provider]  cinacalcet  (SENSIPAR ) 30 MG tablet Take 90 mg by mouth daily with breakfast. After hemodialysis    [provider]  ciprofloxacin  (CIPRO ) 500 MG tablet Take 1 tablet (500 mg total) by mouth daily for 4 days. 09/14/24 09/18/24  Ezenduka, Nkeiruka J, MD  clopidogrel  (PLAVIX ) 75 MG tablet Take 1 tablet (75 mg total) by mouth daily. 03/01/24   Shlomo Wilbert SAUNDERS, MD  collagenase  (SANTYL ) 250 UNIT/GM ointment Apply 1 Application topically daily. Wound dimension (3 cmx 2.5 cm x 0.2 cm, 1 cm x 3.5 cm x 0.2 cm) Dispense QTY sufficient per manufactures dosing calculator. 08/23/24   Sikora, Rebecca, DPM  ethyl chloride spray Apply 1 Application topically as needed (prior to dialysis). 09/29/18    [provider]  FOSRENOL  1000 MG PACK Take 1,000-2,000 mg by mouth See admin instructions. Take 2 packets (2000 mg) by mouth with each meal & take 1 packet (1000 mg) by mouth with each snack 01/12/19   [provider]  gabapentin  (NEURONTIN ) 100 MG capsule Take 100-200 mg by mouth See admin instructions. Take 2 capsules (200 mg) by mouth every morning & take 1 capsule (100 mg) by mouth at bedtime. 03/12/21   [provider]  glucagon 1 MG injection Inject 1 mg into the muscle once as needed (low blood sugar). 06/16/18   [provider]  insulin  aspart (NOVOLOG ) 100 UNIT/ML injection Inject 0-10 Units into the skin See admin instructions. Via insulin  pump    [provider]  ketoconazole (NIZORAL) 2 % shampoo Apply 1 Application topically once a week. 09/13/24   Ezenduka, Nkeiruka J, MD  lidocaine  (LIDODERM ) 5 % Place 1 patch onto the skin daily. 11/27/23   [provider]  methocarbamol (ROBAXIN) 750 MG tablet Take 1 tablet (750 mg total) by mouth every 8 (eight) hours as needed for up to 5 days for muscle spasms. 09/13/24 09/18/24  Ezenduka, Nkeiruka J, MD  metoprolol  tartrate (LOPRESSOR ) 25 MG tablet Take (12.5mg ) half tablet twice a day except on dialysis days Patient taking differently: Take 12.5 mg by mouth See admin instructions. Take (12.5mg ) half tablet twice a day except on dialysis days. Take  12.5mg  at night on dialysis. 07/21/24   Wyn Jackee VEAR Mickey., NP    Allergies: Furosemide and Heparin     Review of Systems  Updated Vital Signs BP (!) 150/74   Pulse 88   Temp 100.2 F (37.9 C) (Oral)   Resp 19   Ht 5' 5 (1.651 m)   Wt 87.6 kg   SpO2 97%   BMI 32.14 kg/m   Physical Exam Vitals and nursing note reviewed.  Constitutional:      Appearance: She is ill-appearing. She is not toxic-appearing.  HENT:     Head: Normocephalic.     Nose: Nose normal.  Eyes:     Conjunctiva/sclera: Conjunctivae normal.  Cardiovascular:      Rate and Rhythm: Regular rhythm. Tachycardia present.  Pulmonary:     Effort: Pulmonary effort is normal.     Breath sounds: Normal breath sounds.  Abdominal:     General: Abdomen is flat. There is no distension.     Palpations: Abdomen is soft.     Tenderness: There is no abdominal tenderness. There is no guarding or rebound.     Comments: PD catheter in place.  Appears to have laparoscopic scars that are well-approximated without erythema induration or fluctuance.  Skin:    General: Skin is warm.     Capillary Refill: Capillary refill takes less than 2 seconds.  Neurological:     Mental Status: She is alert and oriented to person, place, and time.  Psychiatric:        Mood and Affect: Mood normal.        Behavior: Behavior normal.     (all labs ordered are listed, but only abnormal results are displayed) Labs Reviewed  COMPREHENSIVE METABOLIC PANEL WITH GFR - Abnormal; Notable for the following components:      Result Value   Chloride 93 (*)    Glucose, Bld 134 (*)    BUN 40 (*)    Creatinine, Ser 6.70 (*)    Calcium  8.3 (*)    Albumin  2.6 (*)    Alkaline Phosphatase 293 (*)    GFR, Estimated 7 (*)    Anion gap 19 (*)    All other components within normal limits  CBC WITH DIFFERENTIAL/PLATELET - Abnormal; Notable for the following components:   WBC 19.4 (*)    RBC 2.82 (*)    Hemoglobin 8.4 (*)    HCT 26.8 (*)    Platelets 511 (*)    Neutro Abs 16.0 (*)    Monocytes Absolute 1.4 (*)    Abs Immature Granulocytes 0.11 (*)    All other components within normal limits  CULTURE, BLOOD (ROUTINE X 2)  CULTURE, BLOOD (ROUTINE X 2)  PROTIME-INR  URINALYSIS, W/ REFLEX TO CULTURE (INFECTION SUSPECTED)  LIPASE, BLOOD  I-STAT CG4 LACTIC ACID, ED  I-STAT CG4 LACTIC ACID, ED    EKG: None  Radiology: DG Foot 2 Views Right Result Date: 09/16/2024 CLINICAL DATA:  Fevers and chills.  Recent postop. EXAM: RIGHT FOOT - 2 VIEW COMPARISON:  09/10/2024 FINDINGS: Again noted is a  metatarsal amputation involving the fifth ray. Partial amputation of the third toe. Chronic deformities involving the tarsal bones. Negative for an acute fracture or dislocation. IMPRESSION: 1. No acute abnormality in the right foot. 2. Chronic deformities involving the tarsal bones. 3. Postsurgical changes. Electronically Signed   By: Juliene Balder M.D.   On: 09/16/2024 18:47   DG Chest Port 1 View Result Date: 09/16/2024 CLINICAL DATA:  Questionable  sepsis. Fevers and chills. Recent transmetatarsal amputation. EXAM: PORTABLE CHEST 1 VIEW COMPARISON:  06/21/2024 FINDINGS: Electronic device overlying the lateral left chest. Heart and mediastinum are stable. Prominent central vascular structures. Difficult to exclude mild edema. Chronic scarring at the left lung base. Negative for a pneumothorax. Trachea is midline. IMPRESSION: 1. Probable vascular congestion and difficult to exclude mild edema. 2. Evidence for scarring at the left lung base. Electronically Signed   By: Juliene Balder M.D.   On: 09/16/2024 18:42   DG Foot 2 Views Left Result Date: 09/16/2024 CLINICAL DATA:  History of transmetatarsal amputation. Patient now has fevers and chills. EXAM: LEFT FOOT - 2 VIEW COMPARISON:  None Available. FINDINGS: Status post transmetatarsal amputations. Surgical skin staples are present. Expected postoperative changes. No unexpected cortical destruction or periosteal reaction. Diffuse vascular calcifications. No gross soft tissue abnormality. No acute fracture. IMPRESSION: Status post transmetatarsal amputations. Expected postoperative changes. No acute bone abnormality. Electronically Signed   By: Juliene Balder M.D.   On: 09/16/2024 18:37     .Critical Care  Performed by: Neysa Caron PARAS, DO Authorized by: Neysa Caron PARAS, DO   Critical care provider statement:    Critical care time (minutes):  30   Critical care was necessary to treat or prevent imminent or life-threatening deterioration of the following  conditions:  Sepsis    Medications Ordered in the ED  vancomycin  (VANCOREADY) IVPB 2000 mg/400 mL (2,000 mg Intravenous New Bag/Given 09/16/24 1953)  fentaNYL  (SUBLIMAZE ) injection 25 mcg (25 mcg Intravenous Given 09/16/24 1821)  ceFEPIme (MAXIPIME) 2 g in sodium chloride  0.9 % 100 mL IVPB (0 g Intravenous Stopped 09/16/24 1929)  fentaNYL  (SUBLIMAZE ) injection 25 mcg (25 mcg Intravenous Given 09/16/24 1948)    Clinical Course as of 09/16/24 1956  Thu Sep 16, 2024  1750 Dr. Malvin procedure on 10/10: Procedure:  1. Amputation of right third toe at PIPJ level 2. Transmetatarsal amputation of left foot   Poor healing of left flap. D/c on cipro .  [TY]  1800 Spoke with podiatry who is aware of patient and note that she will likely need a BKA and would benefit from vascular consult as they had seen her in the past. [TY]  1835 Spoke with vascular who will see patient in consult. [TY]  1857 Patient with fever, tachypnea new leukocytosis with likely infection secondary to foot infection.  Broad-spectrum antibiotics started.  Avoiding fluids as patient is ESRD on dialysis and is hypertensive with chest x-ray with possible early edema.  Does not appear to be in respiratory distress maintaining oxygen saturation on room air.  Her electrolytes not indicative of derangements necessitating emergent dialysis.  Discussed case with podiatry and vascular, vascular will see patient in consult.  Admitting patient to hospitalist service. 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  Fevers at home.  Temperatures 100.2-year, tachycardic and tachypneic as well.  Likely source would be feet.  Appears there was concern prior to discharge on recent hospitalization that her flap  not healing well and may need further amputation per podiatry's note.  She notes that her bandages were changed Monday, although do not see record  of visit.  Will start empiric antibiotics.  Plan for admission.  Will get x-rays of feet.  And other septic screening labs.  Update; new WBC elevation. Normal lactic. Covered with antbiotics for sepsis 2/2 foot infection. Case disucssed with vascular; see ED course. Admit to hospitalist service.   Amount and/or Complexity of Data Reviewed External Data Reviewed:     Details: See above.  Labs: ordered. Decision-making details documented in ED Course. Radiology: ordered and independent interpretation performed.    Details: Do not appreciate PNA on CXR.  ECG/medicine tests: independent interpretation performed.    Details: No ischemic changes.   Risk Prescription drug management. Parenteral controlled substances. Decision regarding hospitalization. Diagnosis or treatment significantly limited by social determinants of health. Risk Details: Poor health literacy.       Final diagnoses:  Sepsis, due to unspecified organism, unspecified whether acute organ dysfunction present [facility])    ED Discharge Orders     None          Neysa Caron PARAS, DO 09/16/24 1956

## 2024-09-16 NOTE — Anesthesia Postprocedure Evaluation (Signed)
 Anesthesia Post Note  Patient: Health and safety inspector  Procedure(s) Performed: AMPUTATION, FOOT, TRANSMETATARSAL (Left: Toe) AMPUTATION, TOE (Right: Toe)     Patient location during evaluation: PACU Anesthesia Type: MAC Level of consciousness: awake and alert Pain management: pain level controlled Vital Signs Assessment: post-procedure vital signs reviewed and stable Respiratory status: spontaneous breathing, nonlabored ventilation and respiratory function stable Cardiovascular status: stable and blood pressure returned to baseline Postop Assessment: no apparent nausea or vomiting Anesthetic complications: no   No notable events documented.                Monita Swier

## 2024-09-16 NOTE — ED Triage Notes (Signed)
 Patient had all toes on left foot and right  middle toe amputated last week.  Went for follow up and placed on antibiotics due to possible infection and now having fever chills n/v/d and severe back pain.

## 2024-09-16 NOTE — H&P (Signed)
 History and Physical    [facility] FMW:[id number] DOB: 09/25/1976 DOA: 09/16/2024  Patient coming from: Home.  Chief Complaint: Weakness nausea vomiting fever and chills.  HPI: Karen [name] is a 48 y.o. female with history of ESRD on hemodialysis has had recent peritoneal catheter placement with history of CAD status post PCI in February 2025 who was recently admitted to the hospital for diabetic foot infection underwent left foot transmetatarsal amputation and right foot third toe amputation on September 10, 2024 and also underwent angiogram with [facility] angioplasty of the left popliteal artery was discharged home on September 13, 2024 has been experiencing weakness fever chills last 2-3 days.  Had called up her podiatrist office and was instructed to come to the ER.  Patient also has been having nausea vomiting at least 5 episodes today.  Denies abdominal pain.  Her dressing around the peritoneal dialysis catheter got undone and to be replaced at home.  ED Course: In the ER on exam patient has left foot transmetatarsal flap looks discolored.  X-ray shows postoperative changes.  In the ER patient had temperature of 100.2 F initially tachycardic.  ER physician discussed with on-call podiatrist who recommended vascular surgery consult since patient's amputation site at the foot is not healing.  Vascular surgery to be seeing patient in consult.  Labs showed WBC of 19.4 lactic acid of 1.7 hemoglobin 8.4.  Glucose 134.  Review of Systems: As per HPI, rest all negative.   Past Medical History:  Diagnosis Date   ADHD (attention deficit hyperactivity disorder)    Anemia    Arthritis    Asthma    mild = rarely uses inhaler   Blindness of left eye    retinopathy   Diabetes mellitus without complication (HCC)    type 2   Dyspnea    with exertion   Fibromyalgia    GERD (gastroesophageal reflux disease)    no meds   Hypertension    Lung nodule    1.0 cm LLL lung nodule 07/16/22 CT  (evaluation by [facility] Dr. Allyne Render)   Osteomyelitis [facility])    RIGHT FOOT FIFTH TOE   Ovarian mass, left 06/26/2021   Renal disorder    ESRD   Sleep apnea    does not use cpap    Past Surgical History:  Procedure Laterality Date   ABDOMINAL AORTOGRAM W/LOWER EXTREMITY N/A 06/08/2018   Procedure: ABDOMINAL AORTOGRAM W/LOWER EXTREMITY;  Surgeon: Sheree Penne Bruckner, MD;  Location: [facility] INVASIVE CV LAB;  Service: Cardiovascular;  Laterality: N/A;   ABDOMINAL HYSTERECTOMY  09/2016   AMPUTATION Right 06/11/2018   Procedure: AMPUTATION RIGHT FIFTH TOE;  Surgeon: Eliza Bruckner RAMAN, MD;  Location: [facility] OR;  Service: Vascular;  Laterality: Right;   AMPUTATION TOE Right 09/10/2024   Procedure: AMPUTATION, TOE;  Surgeon: Malvin Marsa FALCON, DPM;  Location: [facility] OR;  Service: Orthopedics/Podiatry;  Laterality: Right;  Right third toe partial amp   APPENDECTOMY     AV FISTULA PLACEMENT Left    CHOLECYSTECTOMY     COLON SURGERY     COLONOSCOPY  2023   CORONARY ATHERECTOMY N/A 01/27/2024   Procedure: CORONARY ATHERECTOMY;  Surgeon: Verlin Bruckner BIRCH, MD;  Location: [facility] INVASIVE CV LAB;  Service: Cardiovascular;  Laterality: N/A;   CORONARY STENT INTERVENTION N/A 01/27/2024   Procedure: CORONARY STENT INTERVENTION;  Surgeon: Verlin Bruckner BIRCH, MD;  Location: [facility] INVASIVE CV LAB;  Service: Cardiovascular;  Laterality: N/A;   cysts removed from top of head  1978  age 50yr   EYE SURGERY Bilateral    retinal - left eye blind   EYE SURGERY Right    cataract removed with lens placement   INSERTION OF DIALYSIS CATHETER N/A 08/06/2022   Procedure: INSERTION OF RIGHT INTERNAL JUGULAR TUNNELED DIALYSIS CATHETER;  Surgeon: Eliza Lonni RAMAN, MD;  Location: [facility] OR;  Service: Vascular;  Laterality: N/A;   LEFT HEART CATH AND CORONARY ANGIOGRAPHY N/A 01/27/2024   Procedure: LEFT HEART CATH AND CORONARY ANGIOGRAPHY;  Surgeon: Verlin Lonni BIRCH, MD;  Location: [facility] INVASIVE CV LAB;  Service:  Cardiovascular;  Laterality: N/A;   LOWER EXTREMITY ANGIOGRAPHY N/A 09/08/2024   Procedure: Lower Extremity Angiography;  Surgeon: Lanis Fonda BRAVO, MD;  Location: [facility] INVASIVE CV LAB;  Service: Cardiovascular;  Laterality: N/A;   LOWER EXTREMITY INTERVENTION  09/08/2024   Procedure: LOWER EXTREMITY INTERVENTION;  Surgeon: Lanis Fonda BRAVO, MD;  Location: [facility] INVASIVE CV LAB;  Service: Cardiovascular;;   reimplantation of uterer     at age 74 yrs   REVISON OF ARTERIOVENOUS FISTULA Left 08/06/2022   Procedure: REVISON OF LEFT ARTERIOVENOUS FISTULA WITH REPAIR OF PSEUDOANEURYSMS AND SUPERFISTULIZATIONS;  Surgeon: Eliza Lonni RAMAN, MD;  Location: [facility] OR;  Service: Vascular;  Laterality: Left;   TRANSMETATARSAL AMPUTATION Left 09/10/2024   Procedure: AMPUTATION, FOOT, TRANSMETATARSAL;  Surgeon: Malvin Marsa FALCON, DPM;  Location: [facility] OR;  Service: Orthopedics/Podiatry;  Laterality: Left;  Left foot TMA   TRIGGER FINGER RELEASE     right hand ring finger   TUBAL LIGATION     UPPER GI ENDOSCOPY  2017   WISDOM TOOTH EXTRACTION       reports that she quit smoking about 4 years ago. Her smoking use included cigarettes. She started smoking about 29 years ago. She has a 25 pack-year smoking history. She has never been exposed to tobacco smoke. She has never used smokeless tobacco. She reports that she does not currently use alcohol . She reports current drug use. Drug: Marijuana.  Allergies  Allergen Reactions   Furosemide Swelling   Heparin  Other (See Comments)    Patient relates vitreous hemorrhage after heparin . Patient relates vitreous hemorrhage after heparin .    Family History  Problem Relation Age of Onset   Breast cancer Mother    Diabetes Father    Breast cancer Maternal Grandmother    Stomach cancer Maternal Grandmother    Diabetes Paternal Grandmother    Breast cancer Maternal Aunt    COPD Maternal Aunt    COPD Maternal Aunt    Heart failure Maternal Aunt    COPD Maternal Aunt     Kidney cancer Maternal Aunt     Prior to Admission medications   Medication Sig Start Date End Date Taking? Authorizing Provider  albuterol  (VENTOLIN  HFA) 108 (90 Base) MCG/ACT inhaler Inhale 1-2 puffs into the lungs every 6 (six) hours as needed for wheezing or shortness of breath.    [provider]  aspirin  EC 81 MG tablet Take 1 tablet (81 mg total) by mouth daily. Swallow whole. 02/26/24   Wyn Jackee VEAR Mickey., NP  B Complex-C-Folic Acid  (RENA-VITE PO) Take 1 tablet by mouth in the morning.    [provider]  brimonidine (ALPHAGAN) 0.2 % ophthalmic solution Place 1 drop into both eyes in the morning and at bedtime. 01/29/24   [provider]  cinacalcet  (SENSIPAR ) 30 MG tablet Take 90 mg by mouth daily with breakfast. After hemodialysis    [provider]  ciprofloxacin  (CIPRO ) 500 MG tablet Take  1 tablet (500 mg total) by mouth daily for 4 days. 09/14/24 09/18/24  Ezenduka, Nkeiruka J, MD  clopidogrel  (PLAVIX ) 75 MG tablet Take 1 tablet (75 mg total) by mouth daily. 03/01/24   Shlomo Wilbert SAUNDERS, MD  collagenase  (SANTYL ) 250 UNIT/GM ointment Apply 1 Application topically daily. Wound dimension (3 cmx 2.5 cm x 0.2 cm, 1 cm x 3.5 cm x 0.2 cm) Dispense QTY sufficient per manufactures dosing calculator. 08/23/24   Sikora, Rebecca, DPM  ethyl chloride spray Apply 1 Application topically as needed (prior to dialysis). 09/29/18   [provider]  FOSRENOL  1000 MG PACK Take 1,000-2,000 mg by mouth See admin instructions. Take 2 packets (2000 mg) by mouth with each meal & take 1 packet (1000 mg) by mouth with each snack 01/12/19   [provider]  gabapentin  (NEURONTIN ) 100 MG capsule Take 100-200 mg by mouth See admin instructions. Take 2 capsules (200 mg) by mouth every morning & take 1 capsule (100 mg) by mouth at bedtime. 03/12/21   [provider]  glucagon 1 MG injection Inject 1 mg into the muscle once as needed (low blood sugar). 06/16/18    [provider]  insulin  aspart (NOVOLOG ) 100 UNIT/ML injection Inject 0-10 Units into the skin See admin instructions. Via insulin  pump    [provider]  ketoconazole (NIZORAL) 2 % shampoo Apply 1 Application topically once a week. 09/13/24   Ezenduka, Nkeiruka J, MD  lidocaine  (LIDODERM ) 5 % Place 1 patch onto the skin daily. 11/27/23   [provider]  methocarbamol (ROBAXIN) 750 MG tablet Take 1 tablet (750 mg total) by mouth every 8 (eight) hours as needed for up to 5 days for muscle spasms. 09/13/24 09/18/24  Ezenduka, Nkeiruka J, MD  metoprolol  tartrate (LOPRESSOR ) 25 MG tablet Take (12.5mg ) half tablet twice a day except on dialysis days Patient taking differently: Take 12.5 mg by mouth See admin instructions. Take (12.5mg ) half tablet twice a day except on dialysis days. Take 12.5mg  at night on dialysis. 07/21/24   Wyn Jackee VEAR Mickey., NP    Physical Exam: Constitutional: Moderately built and nourished. Vitals:   09/16/24 1930 09/16/24 1945 09/16/24 2000 09/16/24 2015  BP: (!) 150/74 130/82 (!) 148/57 (!) 140/63  Pulse: 88 85 88 82  Resp: 19 15  17   Temp:      TempSrc:      SpO2: 97% 98% 98% 97%  Weight:      Height:       Eyes: Anicteric no pallor. ENMT: No discharge from the ears eyes nose or mouth. Neck: No mass felt.  No neck rigidity. Respiratory: No rhonchi or crepitations. Cardiovascular: S1-S2 heard. Abdomen: Soft nontender bowel sound present. Musculoskeletal: Left foot transmetatarsal area looks discolored.  There is an ulcer on the anterior aspect of the right foot around the third toe.  Which was amputated. Skin: Left foot transmetatarsal stump area looks discolored.  Ulceration on the dorsal aspect of the right foot around the third metatarsal. Neurologic: Alert awake oriented to time place and person. Psychiatric: Appears normal.  Normal affect.   Labs on Admission: I have personally reviewed following labs and imaging  studies  CBC: Recent Labs  Lab 09/10/24 0450 09/11/24 0500 09/12/24 0416 09/13/24 0555 09/16/24 1747  WBC 10.3 7.1 8.6 10.4 19.4*  NEUTROABS 7.6 3.8 5.3 6.3 16.0*  HGB 8.1* 8.7* 7.8* 7.9* 8.4*  HCT 25.2* 27.6* 24.8* 24.9* 26.8*  MCV 92.3 94.8 94.3 92.9 95.0  PLT 309 332  297 357 511*   Basic Metabolic Panel: Recent Labs  Lab 09/10/24 0450 09/11/24 0500 09/12/24 0416 09/13/24 0555 09/16/24 1747  NA 132* 135 131* 134* 136  K 4.8 4.3 4.6 4.7 4.2  CL 91* 95* 91* 94* 93*  CO2 24 27 27 24 24   GLUCOSE 180* 255* 360* 154* 134*  BUN 51* 27* 42* 54* 40*  CREATININE 7.97* 5.18* 6.87* 8.77* 6.70*  CALCIUM  8.1* 7.8* 7.8* 8.1* 8.3*  PHOS 5.3* 4.5 4.9* 6.4*  --    GFR: Estimated Creatinine Clearance: 11.2 mL/min (A) (by C-G formula based on SCr of 6.7 mg/dL (H)). Liver Function Tests: Recent Labs  Lab 09/10/24 0450 09/11/24 0500 09/12/24 0416 09/13/24 0555 09/16/24 1747  AST  --   --   --   --  20  ALT  --   --   --   --  17  ALKPHOS  --   --   --   --  293*  BILITOT  --   --   --   --  0.8  PROT  --   --   --   --  7.5  ALBUMIN  2.2* 2.2* 2.0* 2.0* 2.6*   Recent Labs  Lab 09/16/24 1950  LIPASE 23   No results for input(s): AMMONIA in the last 168 hours. Coagulation Profile: Recent Labs  Lab 09/16/24 1747  INR 1.1   Cardiac Enzymes: No results for input(s): CKTOTAL, CKMB, CKMBINDEX, TROPONINI in the last 168 hours. BNP (last 3 results) No results for input(s): PROBNP in the last 8760 hours. HbA1C: No results for input(s): HGBA1C in the last 72 hours. CBG: Recent Labs  Lab 09/12/24 2003 09/13/24 0043 09/13/24 0455 09/13/24 1056 09/13/24 1135  GLUCAP 278* 188* 160* 127* 150*   Lipid Profile: No results for input(s): CHOL, HDL, LDLCALC, TRIG, CHOLHDL, LDLDIRECT in the last 72 hours. Thyroid Function Tests: No results for input(s): TSH, T4TOTAL, FREET4, T3FREE, THYROIDAB in the last 72 hours. Anemia Panel: No results  for input(s): VITAMINB12, FOLATE, FERRITIN, TIBC, IRON, RETICCTPCT in the last 72 hours. Urine analysis:    Component Value Date/Time   COLORURINE YELLOW 12/10/2021 2342   APPEARANCEUR TURBID (A) 12/10/2021 2342   LABSPEC 1.015 12/10/2021 2342   PHURINE 7.0 12/10/2021 2342   GLUCOSEU >=500 (A) 12/10/2021 2342   HGBUR MODERATE (A) 12/10/2021 2342   BILIRUBINUR NEGATIVE 12/10/2021 2342   KETONESUR NEGATIVE 12/10/2021 2342   PROTEINUR >300 (A) 12/10/2021 2342   NITRITE NEGATIVE 12/10/2021 2342   LEUKOCYTESUR MODERATE (A) 12/10/2021 2342   Sepsis Labs: @LABRCNTIP (procalcitonin:4,lacticidven:4) ) Recent Results (from the past 240 hours)  Aerobic/Anaerobic Culture w Gram Stain (surgical/deep wound)     Status: None (Preliminary result)   Collection Time: 09/10/24  8:00 AM   Specimen: Foot, Left; Tissue  Result Value Ref Range Status   Specimen Description TISSUE  Final   Special Requests LEFT FOREFOOT  Final   Gram Stain NO WBC SEEN FEW GRAM POSITIVE COCCI IN PAIRS   Final   Culture   Final    ABUNDANT MORGANELLA MORGANII ABUNDANT PROTEUS MIRABILIS MODERATE ENTEROCOCCUS FAECALIS SUSCEPTIBILITIES TO FOLLOW NO ANAEROBES ISOLATED Performed at [facility], [address], [place], [place] [address]    Report Status PENDING  Incomplete   Organism ID, Bacteria MORGANELLA MORGANII  Final   Organism ID, Bacteria PROTEUS MIRABILIS  Final      Susceptibility   Morganella morganii - MIC*    AMPICILLIN >=32 RESISTANT Resistant  ERTAPENEM <=0.12 SENSITIVE Sensitive     CIPROFLOXACIN  <=0.06 SENSITIVE Sensitive     GENTAMICIN <=1 SENSITIVE Sensitive     MEROPENEM <=0.25 SENSITIVE Sensitive     TRIMETH/SULFA <=20 SENSITIVE Sensitive     AMPICILLIN/SULBACTAM >=32 RESISTANT Resistant     PIP/TAZO Value in next row Sensitive      <=4 SENSITIVEThis is a modified FDA-approved test that has been validated and its performance characteristics determined by the reporting  laboratory.  This laboratory is certified under the Clinical Laboratory Improvement Amendments CLIA as qualified to perform high complexity clinical laboratory testing.    * ABUNDANT MORGANELLA MORGANII   Proteus mirabilis - MIC*    AMPICILLIN Value in next row Sensitive      <=4 SENSITIVEThis is a modified FDA-approved test that has been validated and its performance characteristics determined by the reporting laboratory.  This laboratory is certified under the Clinical Laboratory Improvement Amendments CLIA as qualified to perform high complexity clinical laboratory testing.    CEFAZOLIN  (NON-URINE) Value in next row Intermediate      <=4 SENSITIVEThis is a modified FDA-approved test that has been validated and its performance characteristics determined by the reporting laboratory.  This laboratory is certified under the Clinical Laboratory Improvement Amendments CLIA as qualified to perform high complexity clinical laboratory testing.    CEFEPIME Value in next row Sensitive      <=4 SENSITIVEThis is a modified FDA-approved test that has been validated and its performance characteristics determined by the reporting laboratory.  This laboratory is certified under the Clinical Laboratory Improvement Amendments CLIA as qualified to perform high complexity clinical laboratory testing.    ERTAPENEM Value in next row Sensitive      <=4 SENSITIVEThis is a modified FDA-approved test that has been validated and its performance characteristics determined by the reporting laboratory.  This laboratory is certified under the Clinical Laboratory Improvement Amendments CLIA as qualified to perform high complexity clinical laboratory testing.    CEFTRIAXONE  Value in next row Sensitive      <=4 SENSITIVEThis is a modified FDA-approved test that has been validated and its performance characteristics determined by the reporting laboratory.  This laboratory is certified under the Clinical Laboratory Improvement Amendments  CLIA as qualified to perform high complexity clinical laboratory testing.    CIPROFLOXACIN  Value in next row Sensitive      <=4 SENSITIVEThis is a modified FDA-approved test that has been validated and its performance characteristics determined by the reporting laboratory.  This laboratory is certified under the Clinical Laboratory Improvement Amendments CLIA as qualified to perform high complexity clinical laboratory testing.    GENTAMICIN Value in next row Sensitive      <=4 SENSITIVEThis is a modified FDA-approved test that has been validated and its performance characteristics determined by the reporting laboratory.  This laboratory is certified under the Clinical Laboratory Improvement Amendments CLIA as qualified to perform high complexity clinical laboratory testing.    MEROPENEM Value in next row Sensitive      <=4 SENSITIVEThis is a modified FDA-approved test that has been validated and its performance characteristics determined by the reporting laboratory.  This laboratory is certified under the Clinical Laboratory Improvement Amendments CLIA as qualified to perform high complexity clinical laboratory testing.    TRIMETH/SULFA Value in next row Resistant      <=4 SENSITIVEThis is a modified FDA-approved test that has been validated and its performance characteristics determined by the reporting laboratory.  This laboratory is certified under the Clinical Laboratory Improvement  Amendments CLIA as qualified to perform high complexity clinical laboratory testing.    AMPICILLIN/SULBACTAM Value in next row Sensitive      <=4 SENSITIVEThis is a modified FDA-approved test that has been validated and its performance characteristics determined by the reporting laboratory.  This laboratory is certified under the Clinical Laboratory Improvement Amendments CLIA as qualified to perform high complexity clinical laboratory testing.    PIP/TAZO Value in next row Sensitive      <=4 SENSITIVEThis is a modified  FDA-approved test that has been validated and its performance characteristics determined by the reporting laboratory.  This laboratory is certified under the Clinical Laboratory Improvement Amendments CLIA as qualified to perform high complexity clinical laboratory testing.    * ABUNDANT PROTEUS MIRABILIS     Radiological Exams on Admission: DG Foot 2 Views Right Result Date: 09/16/2024 CLINICAL DATA:  Fevers and chills.  Recent postop. EXAM: RIGHT FOOT - 2 VIEW COMPARISON:  09/10/2024 FINDINGS: Again noted is a metatarsal amputation involving the fifth ray. Partial amputation of the third toe. Chronic deformities involving the tarsal bones. Negative for an acute fracture or dislocation. IMPRESSION: 1. No acute abnormality in the right foot. 2. Chronic deformities involving the tarsal bones. 3. Postsurgical changes. Electronically Signed   By: Juliene Balder M.D.   On: 09/16/2024 18:47   DG Chest Port 1 View Result Date: 09/16/2024 CLINICAL DATA:  Questionable sepsis. Fevers and chills. Recent transmetatarsal amputation. EXAM: PORTABLE CHEST 1 VIEW COMPARISON:  06/21/2024 FINDINGS: Electronic device overlying the lateral left chest. Heart and mediastinum are stable. Prominent central vascular structures. Difficult to exclude mild edema. Chronic scarring at the left lung base. Negative for a pneumothorax. Trachea is midline. IMPRESSION: 1. Probable vascular congestion and difficult to exclude mild edema. 2. Evidence for scarring at the left lung base. Electronically Signed   By: Juliene Balder M.D.   On: 09/16/2024 18:42   DG Foot 2 Views Left Result Date: 09/16/2024 CLINICAL DATA:  History of transmetatarsal amputation. Patient now has fevers and chills. EXAM: LEFT FOOT - 2 VIEW COMPARISON:  None Available. FINDINGS: Status post transmetatarsal amputations. Surgical skin staples are present. Expected postoperative changes. No unexpected cortical destruction or periosteal reaction. Diffuse vascular  calcifications. No gross soft tissue abnormality. No acute fracture. IMPRESSION: Status post transmetatarsal amputations. Expected postoperative changes. No acute bone abnormality. Electronically Signed   By: Juliene Balder M.D.   On: 09/16/2024 18:37    EKG: Independently reviewed.  Normal sinus rhythm.  Assessment/Plan Principal Problem:   Sepsis (HCC) Active Problems:   ESRD on hemodialysis (HCC)   DM2 (diabetes mellitus, type 2) (HCC)   Diabetic foot infection (HCC)   Essential hypertension   Anemia in chronic kidney disease   End stage renal disease on dialysis [facility])   CAD S/P percutaneous coronary angioplasty    Sepsis likely from diabetic foot infection.  Underwent recent left foot transmetatarsal foot amputation and also right foot third toe amputation.  The amputated site on the left foot looks discolored..  Vascular surgery has been consulted.  Likely may need further surgery.  On empiric antibiotics.  Follow cultures.  At presentation was tachycardic febrile with concern for sepsis. Nausea vomiting with recent placement of peritoneal dialysis catheter placement.  I did discuss with on-call nephrologist Dr. Dolan who is planning to get fluid collected from peritoneal dialysis catheter to check for cell count and Gram stain.  Presently on empiric antibiotics.  CT abdomen pelvis pending. CAD status post PCI in February  2025 intolerant to statins.  Continue antiplatelet agents and metoprolol . Diabetes mellitus type 2 on insulin  pump.  Last hemoglobin A1c was 6.5   11 days ago. Anemia likely from renal disease follow CBC. Peripheral artery disease status post DCB angioplasty of the left popliteal artery last week.  On antiplatelet agents.  Intolerant to statins. ESRD on hemodialysis did discuss with nephrologist see #2.  Please consult nephrology in the morning for dialysis. 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  Dose confirmed on PDMP website.  Since patient has sepsis will need close monitoring  further workup and more than 2 midnight stay.   DVT prophylaxis: Heparin . Code Status: Full code. Family Communication: Patient's sister and daughter at the bedside. Disposition Plan: Monitored bed. Consults called: Nephrology.  Vascular surgery. Admission status: Inpatient.

## 2024-09-16 NOTE — Progress Notes (Signed)
 ED Pharmacy Antibiotic Sign Off An antibiotic consult was received from an ED provider for vancomycin  and Zosyn  per pharmacy dosing for wound infection. A chart review was completed to assess appropriateness.   Patient has ESRD on HD at baseline, dialysis schedule MWF. Wt 87.6 kg.   The following one time order(s) were placed:  Vancomycin  2000 mg Cefepime 2 g  Further antibiotic and/or antibiotic pharmacy consults should be ordered by the admitting provider if indicated.   Thank you for allowing pharmacy to be a part of this patient's care.   Maurilio Patten, PharmD PGY1 Pharmacy Resident [facility] 09/16/2024 6:12 PM

## 2024-09-17 ENCOUNTER — Encounter (HOSPITAL_COMMUNITY): Payer: Self-pay | Admitting: Internal Medicine

## 2024-09-17 ENCOUNTER — Inpatient Hospital Stay (HOSPITAL_COMMUNITY)

## 2024-09-17 DIAGNOSIS — Z89432 Acquired absence of left foot: Secondary | ICD-10-CM | POA: Diagnosis not present

## 2024-09-17 DIAGNOSIS — E11628 Type 2 diabetes mellitus with other skin complications: Secondary | ICD-10-CM | POA: Diagnosis not present

## 2024-09-17 DIAGNOSIS — E1122 Type 2 diabetes mellitus with diabetic chronic kidney disease: Secondary | ICD-10-CM | POA: Diagnosis not present

## 2024-09-17 DIAGNOSIS — T8789 Other complications of amputation stump: Secondary | ICD-10-CM | POA: Diagnosis not present

## 2024-09-17 DIAGNOSIS — N186 End stage renal disease: Secondary | ICD-10-CM

## 2024-09-17 DIAGNOSIS — I10 Essential (primary) hypertension: Secondary | ICD-10-CM

## 2024-09-17 DIAGNOSIS — I998 Other disorder of circulatory system: Secondary | ICD-10-CM | POA: Diagnosis not present

## 2024-09-17 DIAGNOSIS — Z9862 Peripheral vascular angioplasty status: Secondary | ICD-10-CM

## 2024-09-17 DIAGNOSIS — I96 Gangrene, not elsewhere classified: Secondary | ICD-10-CM | POA: Diagnosis not present

## 2024-09-17 DIAGNOSIS — I739 Peripheral vascular disease, unspecified: Secondary | ICD-10-CM

## 2024-09-17 DIAGNOSIS — L089 Local infection of the skin and subcutaneous tissue, unspecified: Secondary | ICD-10-CM | POA: Diagnosis not present

## 2024-09-17 LAB — AEROBIC/ANAEROBIC CULTURE W GRAM STAIN (SURGICAL/DEEP WOUND): Gram Stain: NONE SEEN

## 2024-09-17 LAB — BODY FLUID CELL COUNT WITH DIFFERENTIAL
Eos, Fluid: 35 %
Lymphs, Fluid: 60 %
Monocyte-Macrophage-Serous Fluid: 3 % — ABNORMAL LOW (ref 50–90)
Neutrophil Count, Fluid: 2 % (ref 0–25)
Total Nucleated Cell Count, Fluid: 51 uL (ref 0–1000)

## 2024-09-17 LAB — GLUCOSE, CAPILLARY
Glucose-Capillary: 232 mg/dL — ABNORMAL HIGH (ref 70–99)
Glucose-Capillary: 175 mg/dL — ABNORMAL HIGH (ref 70–99)
Glucose-Capillary: 212 mg/dL — ABNORMAL HIGH (ref 70–99)
Glucose-Capillary: 214 mg/dL — ABNORMAL HIGH (ref 70–99)

## 2024-09-17 LAB — COMPREHENSIVE METABOLIC PANEL WITH GFR
ALT: 15 U/L (ref 0–44)
AST: 16 U/L (ref 15–41)
Albumin: 2.4 g/dL — ABNORMAL LOW (ref 3.5–5.0)
Alkaline Phosphatase: 255 U/L — ABNORMAL HIGH (ref 38–126)
Anion gap: 18 — ABNORMAL HIGH (ref 5–15)
BUN: 46 mg/dL — ABNORMAL HIGH (ref 6–20)
CO2: 21 mmol/L — ABNORMAL LOW (ref 22–32)
Calcium: 8.6 mg/dL — ABNORMAL LOW (ref 8.9–10.3)
Chloride: 95 mmol/L — ABNORMAL LOW (ref 98–111)
Creatinine, Ser: 7.48 mg/dL — ABNORMAL HIGH (ref 0.44–1.00)
GFR, Estimated: 6 mL/min — ABNORMAL LOW (ref >60)
Glucose, Bld: 235 mg/dL — ABNORMAL HIGH (ref 70–99)
Potassium: 4.3 mmol/L (ref 3.5–5.1)
Sodium: 134 mmol/L — ABNORMAL LOW (ref 135–145)
Total Bilirubin: 0.9 mg/dL (ref 0.0–1.2)
Total Protein: 7.3 g/dL (ref 6.5–8.1)

## 2024-09-17 LAB — CBC
HCT: 27.5 % — ABNORMAL LOW (ref 36.0–46.0)
Hemoglobin: 8.7 g/dL — ABNORMAL LOW (ref 12.0–15.0)
MCH: 29.9 pg (ref 26.0–34.0)
MCHC: 31.6 g/dL (ref 30.0–36.0)
MCV: 94.5 fL (ref 80.0–100.0)
Platelets: 495 K/uL — ABNORMAL HIGH (ref 150–400)
RBC: 2.91 MIL/uL — ABNORMAL LOW (ref 3.87–5.11)
RDW: 13.8 % (ref 11.5–15.5)
WBC: 16.4 K/uL — ABNORMAL HIGH (ref 4.0–10.5)
nRBC: 0 % (ref 0.0–0.2)

## 2024-09-17 LAB — PATHOLOGIST SMEAR REVIEW

## 2024-09-17 LAB — MRSA NEXT GEN BY PCR, NASAL: MRSA by PCR Next Gen: NOT DETECTED

## 2024-09-17 MED ORDER — FENTANYL CITRATE (PF) 50 MCG/ML IJ SOSY
12.5000 ug | PREFILLED_SYRINGE | Freq: Once | INTRAMUSCULAR | Status: AC
  Administered 2024-09-17: 12.5 ug via INTRAVENOUS
  Filled 2024-09-17: qty 1

## 2024-09-17 MED ORDER — ALTEPLASE 2 MG IJ SOLR: 2.0000 mg | Freq: Once | INTRAMUSCULAR | Status: DC | PRN

## 2024-09-17 MED ORDER — ANTICOAGULANT SODIUM CITRATE 4% (200MG/5ML) IV SOLN: 5.0000 mL | Status: DC | PRN

## 2024-09-17 MED ORDER — OXYCODONE HCL 5 MG PO TABS
10.0000 mg | ORAL_TABLET | ORAL | Status: DC | PRN
  Administered 2024-09-17 – 2024-09-25 (×26): 10 mg via ORAL
  Filled 2024-09-17 (×26): qty 2

## 2024-09-17 MED ORDER — CHLORHEXIDINE GLUCONATE CLOTH 2 % EX PADS
6.0000 | MEDICATED_PAD | Freq: Every day | CUTANEOUS | Status: DC
  Administered 2024-09-18 – 2024-09-20 (×3): 6 via TOPICAL

## 2024-09-17 MED ORDER — VANCOMYCIN HCL IN DEXTROSE 1-5 GM/200ML-% IV SOLN
1000.0000 mg | Freq: Once | INTRAVENOUS | Status: AC
  Administered 2024-09-17: 1000 mg via INTRAVENOUS
  Filled 2024-09-17: qty 200

## 2024-09-17 MED ORDER — OXYCODONE HCL 5 MG PO TABS
5.0000 mg | ORAL_TABLET | ORAL | Status: DC | PRN
  Administered 2024-09-17 – 2024-09-23 (×5): 5 mg via ORAL
  Filled 2024-09-17 (×5): qty 1

## 2024-09-17 MED ORDER — NEPRO/CARBSTEADY PO LIQD: 237.0000 mL | ORAL | Status: DC | PRN

## 2024-09-17 MED ORDER — LIDOCAINE-PRILOCAINE 2.5-2.5 % EX CREA: 1.0000 | TOPICAL_CREAM | CUTANEOUS | Status: DC | PRN

## 2024-09-17 MED ORDER — PENTAFLUOROPROP-TETRAFLUOROETH EX AERO: 1.0000 | INHALATION_SPRAY | CUTANEOUS | Status: DC | PRN

## 2024-09-17 MED ORDER — LIDOCAINE HCL (PF) 1 % IJ SOLN: 5.0000 mL | INTRAMUSCULAR | Status: DC | PRN

## 2024-09-17 NOTE — Consult Note (Signed)
 [facility] [place]  ASSESSMENT / PLAN: 48 y.o. female with ischemic left transmetatarsal amputation.  She is optimized from a vascular standpoint.  She needs a left below-knee amputation. Will plan to do this Monday.   CHIEF COMPLAINT: Worsening ischemia of the left transmetatarsal amputation  HISTORY OF PRESENT ILLNESS: Karen [name] is a 48 y.o. female known to our service with history significant for end-stage renal disease, type 2 diabetes, hypertension, peripheral arterial disease.  She recently underwent angiogram of the left lower extremity with angioplasty of the popliteal artery on 09/07/2024.  This was done in preparation for left transmetatarsal amputation.  Transmetatarsal amputation initially was encouraging, but has developed progressive ischemic changes.  The patient initially hoped to do expectant management for these ischemic changes, but this is worsened, prompting her return to the hospital.  Past Medical History:  Diagnosis Date   ADHD (attention deficit hyperactivity disorder)    Anemia    Arthritis    Asthma    mild = rarely uses inhaler   Blindness of left eye    retinopathy   Diabetes mellitus without complication (HCC)    type 2   Dyspnea    with exertion   Fibromyalgia    GERD (gastroesophageal reflux disease)    no meds   Hypertension    Lung nodule    1.0 cm LLL lung nodule 07/16/22 CT (evaluation by [facility] Dr. Allyne Render)   Nausea & vomiting 10/22/2017   Osteomyelitis (HCC)    RIGHT FOOT FIFTH TOE   Ovarian mass, left 06/26/2021   Renal disorder    ESRD   Sleep apnea    does not use cpap    Past Surgical History:  Procedure Laterality Date   ABDOMINAL AORTOGRAM W/LOWER EXTREMITY N/A 06/08/2018   Procedure: ABDOMINAL AORTOGRAM W/LOWER EXTREMITY;  Surgeon: Sheree Penne Bruckner, MD;  Location: [facility] INVASIVE CV LAB;  Service: Cardiovascular;  Laterality: N/A;   ABDOMINAL HYSTERECTOMY  09/2016   AMPUTATION Right  06/11/2018   Procedure: AMPUTATION RIGHT FIFTH TOE;  Surgeon: Eliza Bruckner RAMAN, MD;  Location: [facility] OR;  Service: Vascular;  Laterality: Right;   AMPUTATION TOE Right 09/10/2024   Procedure: AMPUTATION, TOE;  Surgeon: Malvin Marsa FALCON, DPM;  Location: [facility] OR;  Service: Orthopedics/Podiatry;  Laterality: Right;  Right third toe partial amp   APPENDECTOMY     AV FISTULA PLACEMENT Left    CHOLECYSTECTOMY     COLON SURGERY     COLONOSCOPY  2023   CORONARY ATHERECTOMY N/A 01/27/2024   Procedure: CORONARY ATHERECTOMY;  Surgeon: Verlin Bruckner BIRCH, MD;  Location: [facility] INVASIVE CV LAB;  Service: Cardiovascular;  Laterality: N/A;   CORONARY STENT INTERVENTION N/A 01/27/2024   Procedure: CORONARY STENT INTERVENTION;  Surgeon: Verlin Bruckner BIRCH, MD;  Location: [facility] INVASIVE CV LAB;  Service: Cardiovascular;  Laterality: N/A;   cysts removed from top of head  74   age 65yr   EYE SURGERY Bilateral    retinal - left eye blind   EYE SURGERY Right    cataract removed with lens placement   INSERTION OF DIALYSIS CATHETER N/A 08/06/2022   Procedure: INSERTION OF RIGHT INTERNAL JUGULAR TUNNELED DIALYSIS CATHETER;  Surgeon: Eliza Bruckner RAMAN, MD;  Location: [facility] OR;  Service: Vascular;  Laterality: N/A;   LEFT HEART CATH AND CORONARY ANGIOGRAPHY N/A 01/27/2024   Procedure: LEFT HEART CATH AND CORONARY ANGIOGRAPHY;  Surgeon: Verlin Bruckner BIRCH, MD;  [facility]: [facility] INVASIVE CV LAB;  Service: Cardiovascular;  Laterality: N/A;  LOWER EXTREMITY ANGIOGRAPHY N/A 09/08/2024   Procedure: Lower Extremity Angiography;  Surgeon: Lanis Fonda BRAVO, MD;  Location: [facility] INVASIVE CV LAB;  Service: Cardiovascular;  Laterality: N/A;   LOWER EXTREMITY INTERVENTION  09/08/2024   Procedure: LOWER EXTREMITY INTERVENTION;  Surgeon: Lanis Fonda BRAVO, MD;  Location: [facility] INVASIVE CV LAB;  Service: Cardiovascular;;   reimplantation of uterer     at age 69 yrs   REVISON OF ARTERIOVENOUS FISTULA Left 08/06/2022   Procedure:  REVISON OF LEFT ARTERIOVENOUS FISTULA WITH REPAIR OF PSEUDOANEURYSMS AND SUPERFISTULIZATIONS;  Surgeon: Eliza Lonni RAMAN, MD;  Location: [facility] OR;  Service: Vascular;  Laterality: Left;   TRANSMETATARSAL AMPUTATION Left 09/10/2024   Procedure: AMPUTATION, FOOT, TRANSMETATARSAL;  Surgeon: Malvin Marsa FALCON, DPM;  Location: [facility] OR;  Service: Orthopedics/Podiatry;  Laterality: Left;  Left foot TMA   TRIGGER FINGER RELEASE     right hand ring finger   TUBAL LIGATION     UPPER GI ENDOSCOPY  2017   WISDOM TOOTH EXTRACTION      Family History  Problem Relation Age of Onset   Breast cancer Mother    Diabetes Father    Breast cancer Maternal Grandmother    Stomach cancer Maternal Grandmother    Diabetes Paternal Grandmother    Breast cancer Maternal Aunt    COPD Maternal Aunt    COPD Maternal Aunt    Heart failure Maternal Aunt    COPD Maternal Aunt    Kidney cancer Maternal Aunt     Social History   Socioeconomic History   Marital status: Legally Separated    Spouse name: Not on file   Number of children: Not on file   Years of education: Not on file   Highest education level: Not on file  Occupational History   Not on file  Tobacco Use   Smoking status: Former    Current packs/day: 0.00    Average packs/day: 1 pack/day for 25.0 years (25.0 ttl pk-yrs)    Types: Cigarettes    Start date: 08/03/1995    Quit date: 08/02/2020    Years since quitting: 4.1    Passive exposure: Never   Smokeless tobacco: Never  Vaping Use   Vaping status: Never Used  Substance and Sexual Activity   Alcohol  use: Not Currently    Comment: occ   Drug use: Yes    Types: Marijuana    Comment: Last use was on 08/01/22   Sexual activity: Not Currently    Birth control/protection: Surgical    Comment: Hysterectomy  Other Topics Concern   Not on file  Social History Narrative   Not on file   Social Drivers of Health   Financial Resource Strain: Medium Risk (07/28/2024)   Received from  [facility]   Overall Financial Resource Strain (CARDIA)    How hard is it for you to pay for the very basics like food, housing, medical care, and heating?: Somewhat hard  Food Insecurity: No Food Insecurity (09/17/2024)   Hunger Vital Sign    Worried About Running Out of Food in the Last Year: Never true    Ran Out of Food in the Last Year: Never true  Recent Concern: Food Insecurity - Food Insecurity Present (08/25/2024)   Received from [facility]   Hunger Vital Sign    Within the past 12 months, you worried that your food would run out before you got the money to buy more.: Sometimes true    Within the past 12 months, the food  you bought just didn't last and you didn't have money to get more.: Often true  Transportation Needs: No Transportation Needs (09/17/2024)   PRAPARE - Administrator, Civil Service (Medical): No    Lack of Transportation (Non-Medical): No  Physical Activity: Inactive (07/28/2024)   Received from [name]   Exercise Vital Sign    On average, how many days per week do you engage in moderate to strenuous exercise (like a brisk walk)?: 0 days    Minutes of Exercise per Session: Not on file  Stress: No Stress Concern Present (08/25/2024)   Received from [facility] - Occupational Stress Questionnaire    Do you feel stress - tense, restless, nervous, or anxious, or unable to sleep at night because your mind is troubled all the time - these days?: Only a little  Social Connections: Socially Integrated (07/28/2024)   Received from [facility]   Social Network    How would you rate your social network (family, work, friends)?: Good participation with social networks  Intimate Partner Violence: Not At Risk (09/17/2024)   Humiliation, Afraid, Rape, and Kick questionnaire    Fear of Current or Ex-Partner: No    Emotionally Abused: No    Physically Abused: No    Sexually Abused: No    Allergies   Allergen Reactions   Furosemide Swelling   Heparin  Other (See Comments)    Patient relates vitreous hemorrhage after heparin . Patient relates vitreous hemorrhage after heparin .    Current Facility-Administered Medications  Medication Dose Route Frequency Provider Last Rate Last Admin   acetaminophen  (TYLENOL ) tablet 650 mg  650 mg Oral Q6H PRN Franky Redia SAILOR, MD       Or   acetaminophen  (TYLENOL ) suppository 650 mg  650 mg Rectal Q6H PRN Franky Redia SAILOR, MD       alteplase  (CATHFLO ACTIVASE ) injection 2 mg  2 mg Intracatheter Once PRN Geralynn Charleston, MD       anticoagulant sodium citrate  solution 5 mL  5 mL Intracatheter PRN Geralynn Charleston, MD       aspirin  EC tablet 81 mg  81 mg Oral Daily Kakrakandy, Arshad N, MD   81 mg at 09/17/24 0923   brimonidine (ALPHAGAN) 0.2 % ophthalmic solution 1 drop  1 drop Both Eyes TID Kakrakandy, Arshad N, MD   1 drop at 09/16/24 2353   ceFEPIme (MAXIPIME) 1 g in sodium chloride  0.9 % 100 mL IVPB  1 g Intravenous Q24H Franky Redia SAILOR, MD       Chlorhexidine  Gluconate Cloth 2 % PADS 6 each  6 each Topical Q0600 Geralynn Charleston, MD       cinacalcet  (SENSIPAR ) tablet 90 mg  90 mg Oral Q breakfast Kakrakandy, Arshad N, MD   90 mg at 09/17/24 9077   clopidogrel  (PLAVIX ) tablet 75 mg  75 mg Oral Daily Franky Redia SAILOR, MD   75 mg at 09/17/24 9076   feeding supplement (NEPRO CARB STEADY) liquid 237 mL  237 mL Oral PRN Geralynn Charleston, MD       gabapentin  (NEURONTIN ) capsule 100 mg  100 mg Oral QHS Franky Redia SAILOR, MD   100 mg at 09/16/24 2349   gabapentin  (NEURONTIN ) capsule 200 mg  200 mg Oral Daily Franky Redia SAILOR, MD   200 mg at 09/17/24 0923   insulin  pump   Subcutaneous TID WC, HS, 0200 Franky Redia SAILOR, MD   Given at 09/16/24 2300  lidocaine  (PF) (XYLOCAINE ) 1 % injection 5 mL  5 mL Intradermal PRN Geralynn Charleston, MD       lidocaine -prilocaine  (EMLA ) cream 1 Application  1 Application Topical PRN Geralynn Charleston, MD        metoprolol  tartrate (LOPRESSOR ) tablet 12.5 mg  12.5 mg Oral 2 times per day on Sunday Tuesday Thursday Saturday Franky Redia SAILOR, MD   12.5 mg at 09/16/24 2349   pentafluoroprop-tetrafluoroeth (GEBAUERS) aerosol 1 Application  1 Application Topical PRN Geralynn Charleston, MD        PHYSICAL EXAM Vitals:   09/17/24 1000 09/17/24 1030 09/17/24 1100 09/17/24 1130  BP: (!) 169/82 (!) 178/84 (!) 182/56 (!) 179/57  Pulse:  73 72 73  Resp:  13 15 13   Temp:      TempSrc:      SpO2:  100% 98% 99%  Weight:      Height:       Chronically ill middle-aged woman in no distress Regular rate and rhythm Unlabored breathing Ischemic left transmetatarsal amputation.  PERTINENT LABORATORY AND RADIOLOGIC DATA  Most recent CBC    Latest Ref Rng & Units 09/17/2024    4:57 AM 09/16/2024    5:47 PM 09/13/2024    5:55 AM  CBC  WBC 4.0 - 10.5 K/uL 16.4  19.4  10.4   Hemoglobin 12.0 - 15.0 g/dL 8.7  8.4  7.9   Hematocrit 36.0 - 46.0 % 27.5  26.8  24.9   Platelets 150 - 400 K/uL 495  511  357      Most recent CMP    Latest Ref Rng & Units 09/17/2024    4:57 AM 09/16/2024    5:47 PM 09/13/2024    5:55 AM  CMP  Glucose 70 - 99 mg/dL 764  865  845   BUN 6 - 20 mg/dL 46  40  54   Creatinine 0.44 - 1.00 mg/dL 2.51  3.29  1.22   Sodium 135 - 145 mmol/L 134  136  134   Potassium 3.5 - 5.1 mmol/L 4.3  4.2  4.7   Chloride 98 - 111 mmol/L 95  93  94   CO2 22 - 32 mmol/L 21  24  24    Calcium  8.9 - 10.3 mg/dL 8.6  8.3  8.1   Total Protein 6.5 - 8.1 g/dL 7.3  7.5    Total Bilirubin 0.0 - 1.2 mg/dL 0.9  0.8    Alkaline Phos 38 - 126 U/L 255  293    AST 15 - 41 U/L 16  20    ALT 0 - 44 U/L 15  17      Renal function Estimated Creatinine Clearance: 10.3 mL/min (A) (by C-G formula based on SCr of 7.48 mg/dL (H)).  Hgb A1c MFr Bld (%)  Date Value  09/06/2024 6.5 (H)    LDL Chol Calc (NIH)  Date Value Ref Range Status  07/14/2024 136 (H) 0 - 99 mg/dL Final     Debby SAILOR. Magda, MD  FACS [facility] Phone Number: [phone] 09/17/2024 11:39 AM   Total time spent on preparing this encounter including chart review, data review, collecting history, examining the patient, and coordinating care: 60 minutes  Portions of this report may have been transcribed using voice recognition software.  Every effort has been made to ensure accuracy; however, inadvertent computerized transcription errors may still be present.

## 2024-09-17 NOTE — Consult Note (Signed)
 Renal Service Consult Note [facility] [name] Fret, MD  Patient: Karen [name] Date: 09/17/2024 Requesting Physician: Dr. Dana  Reason for Consult: ERSD pt w/ foot infection HPI: The patient is a 49 y.o. year-old w/ PMH as below who presented to ED yesterday c/o fever, chills, n/v/d and back pain. In ED BP 184/84, HR 100, RR 15-23, temp 100.2. 93-98% on RA. K+ 4.3, bun 46, creat 7.5, alb 2.4, wBC 16K, Hb 8.7. IV abx were started. PD fluid sent for cell count which is very low. Pt was admitted. We are asked to see for dialysis.     Pt seen in room. Pt is lethargic, but answers questions well otherwise. Denies any SOB, CP, abd pain at this time.    ROS - denies CP, no joint pain, no HA, no blurry vision, no rash, no diarrhea, no nausea/ vomiting   Past Medical History  Past Medical History:  Diagnosis Date   ADHD (attention deficit hyperactivity disorder)    Anemia    Arthritis    Asthma    mild = rarely uses inhaler   Blindness of left eye    retinopathy   Diabetes mellitus without complication (HCC)    type 2   Dyspnea    with exertion   Fibromyalgia    GERD (gastroesophageal reflux disease)    no meds   Hypertension    Lung nodule    1.0 cm LLL lung nodule 07/16/22 CT (evaluation by [facility] Dr. Allyne Render)   Nausea & vomiting 10/22/2017   Osteomyelitis (HCC)    RIGHT FOOT FIFTH TOE   Ovarian mass, left 06/26/2021   Renal disorder    ESRD   Sleep apnea    does not use cpap   Past Surgical History  Past Surgical History:  Procedure Laterality Date   ABDOMINAL AORTOGRAM W/LOWER EXTREMITY N/A 06/08/2018   Procedure: ABDOMINAL AORTOGRAM W/LOWER EXTREMITY;  Surgeon: Sheree Penne Bruckner, MD;  Location: [facility] INVASIVE CV LAB;  Service: Cardiovascular;  Laterality: N/A;   ABDOMINAL HYSTERECTOMY  09/2016   AMPUTATION Right 06/11/2018   Procedure: AMPUTATION RIGHT FIFTH TOE;  Surgeon: Eliza Bruckner RAMAN, MD;  Location: [facility] OR;  Service:  Vascular;  Laterality: Right;   AMPUTATION TOE Right 09/10/2024   Procedure: AMPUTATION, TOE;  Surgeon: Malvin Marsa FALCON, DPM;  Location: [facility] OR;  Service: Orthopedics/Podiatry;  Laterality: Right;  Right third toe partial amp   APPENDECTOMY     AV FISTULA PLACEMENT Left    CHOLECYSTECTOMY     COLON SURGERY     COLONOSCOPY  2023   CORONARY ATHERECTOMY N/A 01/27/2024   Procedure: CORONARY ATHERECTOMY;  Surgeon: Verlin Bruckner JONETTA, MD;  Location: [facility] INVASIVE CV LAB;  Service: Cardiovascular;  Laterality: N/A;   CORONARY STENT INTERVENTION N/A 01/27/2024   Procedure: CORONARY STENT INTERVENTION;  Surgeon: Verlin Bruckner JONETTA, MD;  Location: [facility] INVASIVE CV LAB;  Service: Cardiovascular;  Laterality: N/A;   cysts removed from top of head  33   age 4yr   EYE SURGERY Bilateral    retinal - left eye blind   EYE SURGERY Right    cataract removed with lens placement   INSERTION OF DIALYSIS CATHETER N/A 08/06/2022   Procedure: INSERTION OF RIGHT INTERNAL JUGULAR TUNNELED DIALYSIS CATHETER;  Surgeon: Eliza Bruckner RAMAN, MD;  Location: [facility] OR;  Service: Vascular;  Laterality: N/A;   LEFT HEART CATH AND CORONARY ANGIOGRAPHY N/A 01/27/2024   Procedure: LEFT HEART CATH AND CORONARY ANGIOGRAPHY;  Surgeon: Verlin Bruckner  D, MD;  Location: [facility] INVASIVE CV LAB;  Service: Cardiovascular;  Laterality: N/A;   LOWER EXTREMITY ANGIOGRAPHY N/A 09/08/2024   Procedure: Lower Extremity Angiography;  Surgeon: Lanis Fonda BRAVO, MD;  Location: [facility] INVASIVE CV LAB;  Service: Cardiovascular;  Laterality: N/A;   LOWER EXTREMITY INTERVENTION  09/08/2024   Procedure: LOWER EXTREMITY INTERVENTION;  Surgeon: Lanis Fonda BRAVO, MD;  Location: [facility] INVASIVE CV LAB;  Service: Cardiovascular;;   reimplantation of uterer     at age 21 yrs   REVISON OF ARTERIOVENOUS FISTULA Left 08/06/2022   Procedure: REVISON OF LEFT ARTERIOVENOUS FISTULA WITH REPAIR OF PSEUDOANEURYSMS AND SUPERFISTULIZATIONS;  Surgeon: Eliza Lonni RAMAN, MD;  Location: [facility] OR;  Service: Vascular;  Laterality: Left;   TRANSMETATARSAL AMPUTATION Left 09/10/2024   Procedure: AMPUTATION, FOOT, TRANSMETATARSAL;  Surgeon: Malvin Marsa FALCON, DPM;  Location[facility] OR;  Service: Orthopedics/Podiatry;  Laterality: Left;  Left foot TMA   TRIGGER FINGER RELEASE     right hand ring finger   TUBAL LIGATION     UPPER GI ENDOSCOPY  2017   WISDOM TOOTH EXTRACTION     Family History  Family History  Problem Relation Age of Onset   Breast cancer Mother    Diabetes Father    Breast cancer Maternal Grandmother    Stomach cancer Maternal Grandmother    Diabetes Paternal Grandmother    Breast cancer Maternal Aunt    COPD Maternal Aunt    COPD Maternal Aunt    Heart failure Maternal Aunt    COPD Maternal Aunt    Kidney cancer Maternal Aunt    Social History  reports that she quit smoking about 4 years ago. Her smoking use included cigarettes. She started smoking about 29 years ago. She has a 25 pack-year smoking history. She has never been exposed to tobacco smoke. She has never used smokeless tobacco. She reports that she does not currently use alcohol . She reports current drug use. Drug: Marijuana. Allergies  Allergies  Allergen Reactions   Furosemide Swelling   Heparin  Other (See Comments)    Patient relates vitreous hemorrhage after heparin . Patient relates vitreous hemorrhage after heparin .   Home medications Prior to Admission medications   Medication Sig Start Date End Date Taking? Authorizing Provider  albuterol  (VENTOLIN  HFA) 108 (90 Base) MCG/ACT inhaler Inhale 1-2 puffs into the lungs every 6 (six) hours as needed for wheezing or shortness of breath.    [provider]  aspirin  EC 81 MG tablet Take 1 tablet (81 mg total) by mouth daily. Swallow whole. 02/26/24   Wyn Jackee VEAR Mickey., NP  B Complex-C-Folic Acid  (RENA-VITE PO) Take 1 tablet by mouth in the morning.    [provider]  brimonidine (ALPHAGAN) 0.2  % ophthalmic solution Place 1 drop into both eyes in the morning and at bedtime. 01/29/24   [provider]  cinacalcet  (SENSIPAR ) 30 MG tablet Take 90 mg by mouth daily with breakfast. After hemodialysis    [provider]  ciprofloxacin  (CIPRO ) 500 MG tablet Take 1 tablet (500 mg total) by mouth daily for 4 days. 09/14/24 09/18/24  Ezenduka, Nkeiruka J, MD  clopidogrel  (PLAVIX ) 75 MG tablet Take 1 tablet (75 mg total) by mouth daily. 03/01/24   Shlomo Wilbert SAUNDERS, MD  collagenase  (SANTYL ) 250 UNIT/GM ointment Apply 1 Application topically daily. Wound dimension (3 cmx 2.5 cm x 0.2 cm, 1 cm x 3.5 cm x 0.2 cm) Dispense QTY sufficient per manufactures dosing calculator. 08/23/24   Sikora, Rebecca, DPM  ethyl chloride spray Apply 1 Application topically as needed (prior to dialysis). 09/29/18   [provider]  FOSRENOL  1000 MG PACK Take 1,000-2,000 mg by mouth See admin instructions. Take 2 packets (2000 mg) by mouth with each meal & take 1 packet (1000 mg) by mouth with each snack 01/12/19   [provider]  gabapentin  (NEURONTIN ) 100 MG capsule Take 100-200 mg by mouth See admin instructions. Take 2 capsules (200 mg) by mouth every morning & take 1 capsule (100 mg) by mouth at bedtime. 03/12/21   [provider]  glucagon 1 MG injection Inject 1 mg into the muscle once as needed (low blood sugar). 06/16/18   [provider]  insulin  aspart (NOVOLOG ) 100 UNIT/ML injection Inject 0-10 Units into the skin See admin instructions. Via insulin  pump    [provider]  ketoconazole (NIZORAL) 2 % shampoo Apply 1 Application topically once a week. 09/13/24   Ezenduka, Nkeiruka J, MD  lidocaine  (LIDODERM ) 5 % Place 1 patch onto the skin daily. 11/27/23   [provider]  methocarbamol (ROBAXIN) 750 MG tablet Take 1 tablet (750 mg total) by mouth every 8 (eight) hours as needed for up to 5 days for muscle spasms. 09/13/24 09/18/24  Ezenduka, Nkeiruka  J, MD  metoprolol  tartrate (LOPRESSOR ) 25 MG tablet Take (12.5mg ) half tablet twice a day except on dialysis days Patient taking differently: Take 12.5 mg by mouth See admin instructions. Take (12.5mg ) half tablet twice a day except on dialysis days. Take 12.5mg  at night on dialysis. 07/21/24   Wyn Jackee VEAR Mickey., NP     Vitals:   09/16/24 2153 09/16/24 2200 09/16/24 2232 09/17/24 0256  BP:   (!) 146/64 (!) 148/75  Pulse:   78 61  Resp:   16 14  Temp: 99.8 F (37.7 C)  98.7 F (37.1 C) 97.7 F (36.5 C)  TempSrc: Oral   Oral  SpO2:   93% 99%  Weight:  90.5 kg    Height:  5' 5 (1.651 m)     Exam Gen alert, no distress Sclera anicteric, throat clear  No jvd or bruits Chest clear bilat to bases RRR no MRG Abd soft ntnd no mass or ascites +bs MS R foot wrapped, 1-2 toe amps, L foot TMA also wrapped Ext no pitting LE or UE edema, no other edema Neuro is alert, Ox 3 , nf    LUA AVF+bruit   Home bp meds: Metoprolol  12.5 mg bid non hd days/ once in evening on hd days   OP HD:  MWF [facility] last week -->  4h  B400   90kg 2K bath  AVF  Heparin  none  Assessment/ Plan: L foot osteomyelitis: recent toe amps per podiatry on 10/10.  ESRD: on HD MWF. Plan is for HD today.  HTN: bp's wnl range. Follow.  Volume: got down to 87.5kg recent admit. UF 1.5-2.5 L. No edema on exam. UF 2-2.5 as tol.  Anemia of esrd: Hb 7-9 here, transfuse prn. Will follow.    Myer Fret  MD [facility] 09/17/2024, 8:33 AM  Recent Labs  Lab 09/12/24 0416 09/13/24 0555 09/16/24 1747 09/17/24 0457  HGB 7.8* 7.9* 8.4* 8.7*  ALBUMIN  2.0* 2.0* 2.6* 2.4*  CALCIUM  7.8* 8.1* 8.3* 8.6*  PHOS 4.9* 6.4*  --   --   CREATININE 6.87* 8.77* 6.70* 7.48*  K 4.6 4.7 4.2 4.3   Inpatient medications:  aspirin  EC  81 mg Oral Daily   brimonidine  1  drop Both Eyes TID   cinacalcet   90 mg Oral Q breakfast   clopidogrel   75 mg Oral Daily   gabapentin   100 mg Oral QHS   gabapentin   200 mg Oral Daily   insulin  pump    Subcutaneous TID WC, HS, 0200   metoprolol  tartrate  12.5 mg Oral 2 times per day on Sunday Tuesday Thursday Saturday    ceFEPime (MAXIPIME) IV     acetaminophen  **OR** acetaminophen 

## 2024-09-17 NOTE — TOC CM/SW Note (Signed)
 Transition of Care [facility]) - Inpatient Brief Assessment   Patient Details  Name: Monicia Tse MRN: [id number] Date of Birth: Feb 14, 1976  [facility] CM/SW Contact:    Tom-Johnson, Harvest Muskrat, RN Phone Number: 09/17/2024, 3:13 PM   Clinical Narrative:  Patient presented to the ED with Generalized Weakness, N/V Fever and Chills. Patient was recently discharged after undergoing amputation of Rt Third Toe and Lt Foot Transmetatarsal on 09/10/24 by Podiatry. Podiatry and Vascular following, plan for Lt BKA on Monday 09/19/24. Patient currently on IV abx and pain management.   CM spoke with patient at bedside about needs for post hospital transition. Patient states she was staying with her sister, Bernice in [place] after recent discharge and will be staying with her at discharge. States no changes to her information on EPIC. Currently active with [facility] for [facility] disciplines, CM sent in resumption of care referral to [facility] and [facility] noted acceptance, info on AVS.   Patient not Medically ready for discharge.  CM will continue to follow as patient progresses with care towards discharge.       Transition of Care Asessment: Insurance and Status: Insurance coverage has been reviewed Patient has primary care physician: Yes Home environment has been reviewed: Yes Prior level of function:: Modified Independent Prior/Current Home Services: Current home services (Home health with [facility]) Social Drivers of Health Review: SDOH reviewed no interventions necessary Readmission risk has been reviewed: Yes Transition of care needs: transition of care needs identified, TOC will continue to follow

## 2024-09-17 NOTE — Consult Note (Signed)
 PODIATRY CONSULTATION  NAME Karen [name] MRN [id number] DOB 1976-01-25 DOA 09/16/2024   Reason for consult:  Chief Complaint  Patient presents with   Post-op Problem    Attending/Consulting physician:   History of present illness: Karen [name] is a 48 y.o. 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  Had called up her podiatrist office and was instructed to come to the ER.  Patient also has been having nausea vomiting at least 5 episodes today.  Denies abdominal pain.  Her dressing around the peritoneal dialysis catheter got undone and to be replaced at home.   Patient reports significant pain to the left foot.  At the time of discharge from her last admission there was concern for necrosis of the left foot.  I discussed with vascular surgery, Dr. Lanis, and he spoke with the patient regarding recommendation for amputation of the left leg.  At the time she wanted to defer this and see if the area could improve as an outpatient.  However she has had worsening pain and now concern for infection with redness and swelling of the area.  We discussed the likelihood of an amputation of the left leg and she is agreeable at this point in time.  I also discussed that the right foot is appearing to be doing well and we will continue to monitor no evidence of infection on the right foot.  Past Medical History:  Diagnosis Date   ADHD (attention deficit hyperactivity disorder)    Anemia    Arthritis    Asthma    mild = rarely uses inhaler    Blindness of left eye    retinopathy   Diabetes mellitus without complication (HCC)    type 2   Dyspnea    with exertion   Fibromyalgia    GERD (gastroesophageal reflux disease)    no meds   Hypertension    Lung nodule    1.0 cm LLL lung nodule 07/16/22 CT (evaluation by [facility] Dr. Allyne Render)   Nausea & vomiting 10/22/2017   Osteomyelitis (HCC)    RIGHT FOOT FIFTH TOE   Ovarian mass, left 06/26/2021   Renal disorder    ESRD   Sleep apnea    does not use cpap       Latest Ref Rng & Units 09/17/2024    4:57 AM 09/16/2024    5:47 PM 09/13/2024    5:55 AM  CBC  WBC 4.0 - 10.5 K/uL 16.4  19.4  10.4   Hemoglobin 12.0 - 15.0 g/dL 8.7  8.4  7.9   Hematocrit 36.0 - 46.0 % 27.5  26.8  24.9   Platelets 150 - 400 K/uL 495  511  357        Latest Ref Rng & Units 09/17/2024    4:57 AM 09/16/2024    5:47 PM 09/13/2024    5:55 AM  BMP  Glucose 70 - 99 mg/dL 764  865  845   BUN 6 - 20 mg/dL 46  40  54   Creatinine 0.44 - 1.00 mg/dL 2.51  3.29  1.22  Sodium 135 - 145 mmol/L 134  136  134   Potassium 3.5 - 5.1 mmol/L 4.3  4.2  4.7   Chloride 98 - 111 mmol/L 95  93  94   CO2 22 - 32 mmol/L 21  24  24    Calcium  8.9 - 10.3 mg/dL 8.6  8.3  8.1       Physical Exam: Lower Extremity Exam  Left TMA with significant necrotic changes at the incision line as well as in the plantar flap is dusky with purple-black discoloration.  Also with some new early necrotic changes of the left heel       Right foot third toe amputation partially well coapted no dehiscence drainage or necrosis.  Healing very well    ASSESSMENT/PLAN OF CARE 48 y.o. 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  Right third toe amputation site healing well no evidence of necrosis or infection.  - Discussed with the patient I do recommend a below the knee or above-the-knee amputation per vascular recommendation on the left lower extremity.  She is agreeable to this at this point in time.  Discussed that unfortunately she is not a candidate for revision in the foot level due to the significant necrosis of the plantar flap and likelihood that she would have additional wound healing problems with any intervention in the midfoot level.  She is understanding of this - The right foot third toe amputation site is very healthy no evidence of necrosis or dehiscence or residual infection.  Continue to monitor. - Continue IV abx broad spectrum pending further culture data - Anticoagulation: Per primary/vascular - Wound care: I changed the dressing on the right foot.  This does not need to be changed for another week or so.  If it is changed would recommend Xeroform 4 x 4 gauze Kerlix Ace roll   -Will have the patient follow-up in our office for her right third toe amputation site.  Sutures will come out in 1 to 2 weeks. - WB status: Weightbearing as tolerated on the right foot in postop shoe - Will sign off please message with questions.   Thank you for the consult.  Please contact me directly with any questions or concerns.           Karen [name], DPM [facility]    [address] [place], [place] [address]                Office [phone]  Fax [phone]

## 2024-09-17 NOTE — Plan of Care (Signed)
   Problem: Clinical Measurements: Goal: Ability to maintain clinical measurements within normal limits will improve Outcome: Progressing

## 2024-09-17 NOTE — Progress Notes (Signed)
   09/17/24 0256  Vitals  Temp 97.7 F (36.5 C)  Temp Source Oral  BP (!) 148/75  MAP (mmHg) 94  BP Location Right Leg  BP Method Automatic  Patient Position (if appropriate) Lying  Pulse Rate 61  ECG Heart Rate 60  Resp 14  Oxygen Therapy  SpO2 99 %  O2 Device Room Air   Peritoneal fluid sent to lab for cell count and gram stains

## 2024-09-17 NOTE — Progress Notes (Addendum)
 Received patient in bed.Alert and oriented x4.Consent verified.  Access used: Left arm avf that worked well.  Duration of treatment: 3.5 hrs.  Uf goal : Met 2.5 L,tolerated well.  Hand off to the patient's nurse.Back into her room with stable vitals via transporter.

## 2024-09-17 NOTE — Inpatient Diabetes Management (Signed)
 Inpatient Diabetes Program Recommendations  AACE/ADA: New Consensus Statement on Inpatient Glycemic Control   Target Ranges:  Prepandial:   less than 140 mg/dL      Peak postprandial:   less than 180 mg/dL (1-2 hours)      Critically ill patients:  140 - 180 mg/dL   Lab Results  Component Value Date   GLUCAP 175 (H) 09/17/2024   HGBA1C 6.5 (H) 09/06/2024    Latest Reference Range & Units 09/16/24 22:26 09/17/24 03:02 09/17/24 08:43  Glucose-Capillary 70 - 99 mg/dL 838 (H) 767 (H) 824 (H)    Review of Glycemic Control Diabetes history: DM2  Outpatient Diabetes medications:   Patient sees Dr Hosie at [facility]. Last seen on 08/31/24.  Per Office visit note - patient uses Tandem t:slim with Control-IQ and was in range 80% of the time.   INSULIN  PUMP SETTINGS (Tandem t:slim X2):  INSULIN  SENSITIVITY:  45  BASAL RATES: 00:00 0.60  CARB RATIO: 00:00 13  CORRECTION FACTOR: 00:00 50  TARGET BLOOD GLUCOSE: 00:00 110  TOTAL DAILY INSULIN  = 29 units (46% basal; 54% bolus)    Current orders for Inpatient glycemic control: Insulin  Pump   Inpatient Diabetes Program Recommendations:  No changes. Continue Insulin  Pump at this time.   Thanks,  Lavanda Search, RN, MSN, [facility]  Inpatient Diabetes Coordinator  Pager [phone] (8a-5p)

## 2024-09-17 NOTE — Progress Notes (Signed)
 Pt receives out-pt HD at [facility], MWF, 0630 chair time. Per clinic, she has been getting her PD flushed but has not started actual PD yet due to scheduling/ staff issues. Informed clinic of pt arrival. Will continue to assist as needed.   Karen [name] Dialysis Nav [phone]

## 2024-09-17 NOTE — Progress Notes (Signed)
 PROGRESS NOTE    Karen [name]  FMW:[id number]  DOB: 1976-09-16  DOA: 09/16/2024 PCP: Lesa Jon HERO, PA Outpatient Specialists:   Hospital course:  48 y.o. female with history of ESRD on hemodialysis has had recent peritoneal catheter placement with history of CAD status post PCI in February 2025 who was recently admitted to the hospital for diabetic foot infection underwent left foot transmetatarsal amputation and right foot third toe amputation on September 10, 2024 and also underwent angiogram with [facility] angioplasty of the left popliteal artery was discharged home on September 13, 2024 has been experiencing weakness fever chills last 2-3 days.  In ER patient was noted to have a fever, tachycardia, leukocytosis and discolored transmetatarsal flap.  Patient was started on empiric antibiotics.  Patient was seen by vascular surgery who recommended BKA which the patient has agreed to.  Subjective:  Patient states that she is doing okay but notes that her pain in her foot is particularly bad right after dialysis and would like some IV pain medications after dialysis.  Notes that her oral pain medications are dialyzed out of her arm that is why she has so much pain after dialysis.   Objective: Vitals:   09/17/24 1315 09/17/24 1316 09/17/24 1451 09/17/24 1917  BP: (!) 163/68 (!) 174/71 (!) 148/62 (!) 152/81  Pulse: 73 72 67 71  Resp: 19 14 18 17   Temp:  97.9 F (36.6 C) 98.6 F (37 C) 99.3 F (37.4 C)  TempSrc:  Oral  Oral  SpO2: 100% 99% 98% 100%  Weight:  90.3 kg    Height:        Intake/Output Summary (Last 24 hours) at 09/17/2024 1945 Last data filed at 09/17/2024 1316 Gross per 24 hour  Intake 240 ml  Output 2300 ml  Net -2060 ml   Filed Weights   09/16/24 2200 09/17/24 0936 09/17/24 1316  Weight: 90.5 kg 92.5 kg 90.3 kg     Exam:  General: Patient's sitting up in bed speaking valuable he with somebody on the phone and NAD. Eyes: sclera anicteric,  conjuctiva mild injection bilaterally CVS: S1-S2, regular  Respiratory:  decreased air entry bilaterally secondary to decreased inspiratory effort, rales at bases  GI: NABS, soft, NT  LE: CDI Neuro: A/O x 3,  grossly nonfocal.   Data Reviewed:  Basic Metabolic Panel: Recent Labs  Lab 09/11/24 0500 09/12/24 0416 09/13/24 0555 09/16/24 1747 09/17/24 0457  NA 135 131* 134* 136 134*  K 4.3 4.6 4.7 4.2 4.3  CL 95* 91* 94* 93* 95*  CO2 27 27 24 24  21*  GLUCOSE 255* 360* 154* 134* 235*  BUN 27* 42* 54* 40* 46*  CREATININE 5.18* 6.87* 8.77* 6.70* 7.48*  CALCIUM  7.8* 7.8* 8.1* 8.3* 8.6*  PHOS 4.5 4.9* 6.4*  --   --     CBC: Recent Labs  Lab 09/11/24 0500 09/12/24 0416 09/13/24 0555 09/16/24 1747 09/17/24 0457  WBC 7.1 8.6 10.4 19.4* 16.4*  NEUTROABS 3.8 5.3 6.3 16.0*  --   HGB 8.7* 7.8* 7.9* 8.4* 8.7*  HCT 27.6* 24.8* 24.9* 26.8* 27.5*  MCV 94.8 94.3 92.9 95.0 94.5  PLT 332 297 357 511* 495*     Scheduled Meds:  aspirin  EC  81 mg Oral Daily   brimonidine  1 drop Both Eyes TID   Chlorhexidine  Gluconate Cloth  6 each Topical Q0600   cinacalcet   90 mg Oral Q breakfast   clopidogrel   75 mg Oral Daily   gabapentin   100  mg Oral QHS   gabapentin   200 mg Oral Daily   insulin  pump   Subcutaneous TID WC, HS, 0200   metoprolol  tartrate  12.5 mg Oral 2 times per day on Sunday Tuesday Thursday Saturday   Continuous Infusions:  ceFEPime (MAXIPIME) IV       Assessment & Plan:   Sepsis physiology is resolved Nausea and vomiting is resolved Diabetic foot infection PAD Patient has responded well to cefepime Podiatry has recommended a BKA which patient is agreeable to Vascular surgery has seen the patient and is planning for BKA on Monday. S/p DCB angioplasty of left popliteal artery last week On antiplatelet agents, intolerant to statins  Pain management Neuropathy Will discuss with nephrology whether it is reasonable to assume that her pain medications are getting  dialyzed out of her and that she needs IV pain medications after HD. Continue gabapentin   DM 2 Blood sugars are not well-controlled on insulin  pump although last A1c was 6.5. Possible dietary indiscretion while she has been here Diabetes coordinator consult placed  ESRD On HD per nephrology    DVT prophylaxis: Subcu heparin  Code Status: Full Family Communication: None at bedside     Studies: CT RENAL STONE STUDY Result Date: 09/17/2024 EXAM: CT UROGRAM 09/17/2024 07:55:16 AM TECHNIQUE: CT of the abdomen and pelvis was performed without the administration of intravenous contrast as per CT urogram protocol. Multiplanar reformatted images are provided for review. Automated exposure control, iterative reconstruction, and/or weight based adjustment of the mA/kV was utilized to reduce the radiation dose to as low as reasonably achievable. COMPARISON: None available. CLINICAL HISTORY: Nausea vomiting. Sepsis (HCC). Post-op Problem. CT RENAL STONE STUDY. FINDINGS: LOWER CHEST: Mild left posterior basilar atelectasis or infiltrate is noted. LIVER: Status post cholecystectomy. GALLBLADDER AND BILE DUCTS: Status post cholecystectomy. No biliary ductal dilatation. SPLEEN: No acute abnormality. PANCREAS: Pancreatic atrophy is noted. ADRENAL GLANDS: No acute abnormality. KIDNEYS, URETERS AND BLADDER: Severe right renal atrophy is noted. No stones in the kidneys or ureters. No hydronephrosis. No perinephric or periureteral stranding. Urinary bladder is unremarkable. GI AND BOWEL: Status post appendectomy. Stomach demonstrates no acute abnormality. There is no bowel obstruction. PERITONEUM AND RETROPERITONEUM: Peritoneal dialysis catheter is noted in the pelvis. Minimal free fluid is noted in the abdomen and pelvis consistent with peritone analysis. VASCULATURE: Aortic atherosclerosis. Aorta is normal in caliber. LYMPH NODES: No lymphadenopathy. REPRODUCTIVE ORGANS: Status post hysterectomy. BONES AND SOFT  TISSUES: No acute osseous abnormality. No focal soft tissue abnormality. IMPRESSION: 1. Severe right renal atrophy. 2. Pancreatic atrophy. 3. Mild left posterior basilar atelectasis or infiltrate. Electronically signed by: Lynwood Seip MD 09/17/2024 08:05 AM EDT RP Workstation: [id number]   DG Foot 2 Views Right Result Date: 09/16/2024 CLINICAL DATA:  Fevers and chills.  Recent postop. EXAM: RIGHT FOOT - 2 VIEW COMPARISON:  09/10/2024 FINDINGS: Again noted is a metatarsal amputation involving the fifth ray. Partial amputation of the third toe. Chronic deformities involving the tarsal bones. Negative for an acute fracture or dislocation. IMPRESSION: 1. No acute abnormality in the right foot. 2. Chronic deformities involving the tarsal bones. 3. Postsurgical changes. Electronically Signed   By: Juliene Balder M.D.   On: 09/16/2024 18:47   DG Chest Port 1 View Result Date: 09/16/2024 CLINICAL DATA:  Questionable sepsis. Fevers and chills. Recent transmetatarsal amputation. EXAM: PORTABLE CHEST 1 VIEW COMPARISON:  06/21/2024 FINDINGS: Electronic device overlying the lateral left chest. Heart and mediastinum are stable. Prominent central vascular structures. Difficult to exclude mild edema. Chronic scarring  at the left lung base. Negative for a pneumothorax. Trachea is midline. IMPRESSION: 1. Probable vascular congestion and difficult to exclude mild edema. 2. Evidence for scarring at the left lung base. Electronically Signed   By: Juliene Balder M.D.   On: 09/16/2024 18:42   DG Foot 2 Views Left Result Date: 09/16/2024 CLINICAL DATA:  History of transmetatarsal amputation. Patient now has fevers and chills. EXAM: LEFT FOOT - 2 VIEW COMPARISON:  None Available. FINDINGS: Status post transmetatarsal amputations. Surgical skin staples are present. Expected postoperative changes. No unexpected cortical destruction or periosteal reaction. Diffuse vascular calcifications. No gross soft tissue abnormality. No acute  fracture. IMPRESSION: Status post transmetatarsal amputations. Expected postoperative changes. No acute bone abnormality. Electronically Signed   By: Juliene Balder M.D.   On: 09/16/2024 18:37    Principal Problem:   Sepsis (HCC) Active Problems:   ESRD on hemodialysis (HCC)   DM2 (diabetes mellitus, type 2) (HCC)   Nausea & vomiting   Diabetic foot infection (HCC)   Essential hypertension   Anemia in chronic kidney disease   End stage renal disease on dialysis [facility])   Gangrene of left foot (HCC)   CAD S/P percutaneous coronary angioplasty     Karen [name] Vangie Pike, [facility]  If 7PM-7AM, please contact night-coverage [email or web address]   LOS: 1 day

## 2024-09-17 NOTE — Procedures (Signed)
 S: pt seen in HD unit, tolerating well at this time  Vitals:   09/17/24 0949 09/17/24 1000 09/17/24 1030 09/17/24 1100  BP: (!) 149/65 (!) 169/82 (!) 178/84 (!) 182/56  Pulse: 68  73 72  Resp: 17  13 15   Temp:      TempSrc:      SpO2: 97%  100% 98%  Weight:      Height:        Recent Labs  Lab 09/12/24 0416 09/13/24 0555 09/16/24 1747 09/17/24 0457  HGB 7.8* 7.9* 8.4* 8.7*  ALBUMIN  2.0* 2.0* 2.6* 2.4*  CALCIUM  7.8* 8.1* 8.3* 8.6*  PHOS 4.9* 6.4*  --   --   CREATININE 6.87* 8.77* 6.70* 7.48*  K 4.6 4.7 4.2 4.3    Inpatient medications:  aspirin  EC  81 mg Oral Daily   brimonidine  1 drop Both Eyes TID   Chlorhexidine  Gluconate Cloth  6 each Topical Q0600   cinacalcet   90 mg Oral Q breakfast   clopidogrel   75 mg Oral Daily   gabapentin   100 mg Oral QHS   gabapentin   200 mg Oral Daily   insulin  pump   Subcutaneous TID WC, HS, 0200   metoprolol  tartrate  12.5 mg Oral 2 times per day on Sunday Tuesday Thursday Saturday    anticoagulant sodium citrate      ceFEPime (MAXIPIME) IV     acetaminophen  **OR** acetaminophen , alteplase , anticoagulant sodium citrate , feeding supplement (NEPRO CARB STEADY), lidocaine  (PF), lidocaine -prilocaine , pentafluoroprop-tetrafluoroeth  I was present at the procedure, reviewed the HD regimen and made appropriate changes.   Myer Fret MD  [facility] 09/17/2024, 11:07 AM

## 2024-09-18 ENCOUNTER — Inpatient Hospital Stay (HOSPITAL_COMMUNITY)

## 2024-09-18 DIAGNOSIS — E11628 Type 2 diabetes mellitus with other skin complications: Secondary | ICD-10-CM | POA: Diagnosis not present

## 2024-09-18 DIAGNOSIS — L089 Local infection of the skin and subcutaneous tissue, unspecified: Secondary | ICD-10-CM | POA: Diagnosis not present

## 2024-09-18 LAB — CBC
HCT: 29.6 % — ABNORMAL LOW (ref 36.0–46.0)
Hemoglobin: 8.9 g/dL — ABNORMAL LOW (ref 12.0–15.0)
MCH: 29.1 pg (ref 26.0–34.0)
MCHC: 30.1 g/dL (ref 30.0–36.0)
MCV: 96.7 fL (ref 80.0–100.0)
Platelets: 572 K/uL — ABNORMAL HIGH (ref 150–400)
RBC: 3.06 MIL/uL — ABNORMAL LOW (ref 3.87–5.11)
RDW: 13.9 % (ref 11.5–15.5)
WBC: 12.1 K/uL — ABNORMAL HIGH (ref 4.0–10.5)
nRBC: 0 % (ref 0.0–0.2)

## 2024-09-18 LAB — GLUCOSE, CAPILLARY
Glucose-Capillary: 193 mg/dL — ABNORMAL HIGH (ref 70–99)
Glucose-Capillary: 143 mg/dL — ABNORMAL HIGH (ref 70–99)
Glucose-Capillary: 190 mg/dL — ABNORMAL HIGH (ref 70–99)
Glucose-Capillary: 92 mg/dL (ref 70–99)
Glucose-Capillary: 158 mg/dL — ABNORMAL HIGH (ref 70–99)

## 2024-09-18 LAB — COMPREHENSIVE METABOLIC PANEL WITH GFR
ALT: 14 U/L (ref 0–44)
AST: 15 U/L (ref 15–41)
Albumin: 2.3 g/dL — ABNORMAL LOW (ref 3.5–5.0)
Alkaline Phosphatase: 246 U/L — ABNORMAL HIGH (ref 38–126)
Anion gap: 16 — ABNORMAL HIGH (ref 5–15)
BUN: 33 mg/dL — ABNORMAL HIGH (ref 6–20)
CO2: 27 mmol/L (ref 22–32)
Calcium: 8.3 mg/dL — ABNORMAL LOW (ref 8.9–10.3)
Chloride: 94 mmol/L — ABNORMAL LOW (ref 98–111)
Creatinine, Ser: 5.42 mg/dL — ABNORMAL HIGH (ref 0.44–1.00)
GFR, Estimated: 9 mL/min — ABNORMAL LOW (ref >60)
Glucose, Bld: 163 mg/dL — ABNORMAL HIGH (ref 70–99)
Potassium: 4.4 mmol/L (ref 3.5–5.1)
Sodium: 137 mmol/L (ref 135–145)
Total Bilirubin: 0.6 mg/dL (ref 0.0–1.2)
Total Protein: 7.2 g/dL (ref 6.5–8.1)

## 2024-09-18 MED ORDER — PROMETHAZINE HCL 25 MG RE SUPP
25.0000 mg | Freq: Four times a day (QID) | RECTAL | Status: DC | PRN
  Administered 2024-09-26: 25 mg via RECTAL
  Filled 2024-09-18 (×3): qty 1

## 2024-09-18 MED ORDER — FENTANYL CITRATE (PF) 50 MCG/ML IJ SOSY
50.0000 ug | PREFILLED_SYRINGE | INTRAMUSCULAR | Status: AC | PRN
Start: 2024-09-18 — End: 2024-09-22
  Administered 2024-09-20 – 2024-09-22 (×2): 50 ug via INTRAVENOUS

## 2024-09-18 MED ORDER — LIDOCAINE 5 % EX PTCH
2.0000 | MEDICATED_PATCH | CUTANEOUS | Status: AC
  Administered 2024-09-18 – 2024-09-19 (×2): 2 via TRANSDERMAL
  Filled 2024-09-18 (×2): qty 2

## 2024-09-18 MED ORDER — ONDANSETRON HCL 4 MG/2ML IJ SOLN
4.0000 mg | Freq: Four times a day (QID) | INTRAMUSCULAR | Status: DC | PRN
  Administered 2024-09-18: 4 mg via INTRAVENOUS
  Filled 2024-09-18 (×2): qty 2

## 2024-09-18 MED ORDER — PROMETHAZINE HCL 25 MG PO TABS
25.0000 mg | ORAL_TABLET | Freq: Four times a day (QID) | ORAL | Status: DC | PRN
  Administered 2024-09-18 – 2024-09-28 (×10): 25 mg via ORAL
  Filled 2024-09-18 (×10): qty 1

## 2024-09-18 NOTE — Plan of Care (Signed)
 Please see full progress note concerning the events and outcomes for this patient through out the shift.   Cowen Pesqueira, RN

## 2024-09-18 NOTE — Progress Notes (Signed)
 PROGRESS NOTE    Karen [name]  FMW:[id number]  DOB: 1976-04-28  DOA: 09/16/2024 PCP: Lesa Jon HERO, PA Outpatient Specialists:   Hospital course:  48 y.o. female with history of ESRD on hemodialysis has had recent peritoneal catheter placement with history of CAD status post PCI in February 2025 who was recently admitted to the hospital for diabetic foot infection underwent left foot transmetatarsal amputation and right foot third toe amputation on September 10, 2024 and also underwent angiogram with [facility] angioplasty of the left popliteal artery was discharged home on September 13, 2024 has been experiencing weakness fever chills last 2-3 days.  In ER patient was noted to have a fever, tachycardia, leukocytosis and discolored transmetatarsal flap.  Patient was started on empiric antibiotics.  Patient was seen by vascular surgery who recommended BKA which the patient has agreed to.  Subjective:  Patient states that she has been having right hip pain since she twisted her leg tween her hospital admissions.  I told patient that I discussed with Dr. Vassie and that we would be providing as needed fentanyl  after dialysis for pain.  She was appreciative of that.   Objective: Vitals:   09/18/24 0354 09/18/24 0931 09/18/24 1653 09/18/24 2030  BP: (!) 160/74 100/62 122/60 125/63  Pulse: 77 79 67 79  Resp: 16 18 18 18   Temp: 98.5 F (36.9 C) 98 F (36.7 C) 98.4 F (36.9 C) 98.6 F (37 C)  TempSrc: Oral     SpO2: 100% 98% 97% 97%  Weight:      Height:        Intake/Output Summary (Last 24 hours) at 09/18/2024 2034 Last data filed at 09/18/2024 0600 Gross per 24 hour  Intake 400.07 ml  Output 0 ml  Net 400.07 ml   Filed Weights   09/16/24 2200 09/17/24 0936 09/17/24 1316  Weight: 90.5 kg 92.5 kg 90.3 kg     Exam:  General: Patient sleeping comfortably in NAD, arousable by voice 1. Eyes: sclera anicteric, conjuctiva mild injection bilaterally CVS: S1-S2,  regular  Respiratory:  decreased air entry bilaterally secondary to decreased inspiratory effort, rales at bases  GI: NABS, soft, NT  LE: CDI Neuro: A/O x 3,  grossly nonfocal.   Data Reviewed:  Basic Metabolic Panel: Recent Labs  Lab 09/12/24 0416 09/13/24 0555 09/16/24 1747 09/17/24 0457 09/18/24 0327  NA 131* 134* 136 134* 137  K 4.6 4.7 4.2 4.3 4.4  CL 91* 94* 93* 95* 94*  CO2 27 24 24  21* 27  GLUCOSE 360* 154* 134* 235* 163*  BUN 42* 54* 40* 46* 33*  CREATININE 6.87* 8.77* 6.70* 7.48* 5.42*  CALCIUM  7.8* 8.1* 8.3* 8.6* 8.3*  PHOS 4.9* 6.4*  --   --   --     CBC: Recent Labs  Lab 09/12/24 0416 09/13/24 0555 09/16/24 1747 09/17/24 0457 09/18/24 0327  WBC 8.6 10.4 19.4* 16.4* 12.1*  NEUTROABS 5.3 6.3 16.0*  --   --   HGB 7.8* 7.9* 8.4* 8.7* 8.9*  HCT 24.8* 24.9* 26.8* 27.5* 29.6*  MCV 94.3 92.9 95.0 94.5 96.7  PLT 297 357 511* 495* 572*     Scheduled Meds:  aspirin  EC  81 mg Oral Daily   brimonidine  1 drop Both Eyes TID   Chlorhexidine  Gluconate Cloth  6 each Topical Q0600   cinacalcet   90 mg Oral Q breakfast   clopidogrel   75 mg Oral Daily   gabapentin   100 mg Oral QHS   gabapentin   200  mg Oral Daily   insulin  pump   Subcutaneous TID WC, HS, 0200   lidocaine   2 patch Transdermal Q24H   metoprolol  tartrate  12.5 mg Oral 2 times per day on Sunday Tuesday Thursday Saturday   Continuous Infusions:  ceFEPime (MAXIPIME) IV 1 g (09/17/24 2155)     Assessment & Plan:   Sepsis physiology is resolved Nausea and vomiting is resolved 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 Continue cefepime On antiplatelet agents, intolerant to statins  Pain management with significant increase of pain during dialysis Neuropathy Patient believes her pain medications  are getting dialyzed out of her which causes increased pain after dialysis. Discussed with Dr. Conda who notes that it is possible anuria. He notes it would be reasonable to give her as needed fentanyl  after dialysis to manage pain until she can have more oral pain medications, states he has seen patients wake up from sleep with sudden pain during dialysis As needed fentanyl  to be given with dialysis ordered Continue gabapentin   DM 2 Blood sugars are much better controlled today Continue insulin  pump  ESRD On HD per nephrology    DVT prophylaxis: Subcu heparin  Code Status: Full Family Communication: None at bedside     Studies: DG HIP UNILAT WITH PELVIS 1V RIGHT Result Date: 09/18/2024 CLINICAL DATA:  Right hip pain. EXAM: DG HIP (WITH OR WITHOUT PELVIS) 1V RIGHT COMPARISON:  CT 09/17/2024 FINDINGS: There is diffuse decreased bone mineralization present. Peritoneal dialysis catheter with tip over the right lower quadrant. Hips are within normal and symmetric. There is no significant degenerative changes. There is no acute fracture or dislocation. Moderate fecal retention over the rectosigmoid colon. IMPRESSION: No acute findings. Electronically Signed   By: Toribio Agreste M.D.   On: 09/18/2024 10:38   DG Lumbar Spine 1 View Result Date: 09/18/2024 CLINICAL DATA:  Right hip pain. EXAM: LUMBAR SPINE - 1 VIEW COMPARISON:  None Available. FINDINGS: Vertebral body alignment and heights over the lumbar spine are normal. Mild facet arthropathy over the mid to lower lumbar spine. No evidence of compression fracture or spondylolisthesis involving the lumbar spine. Disc space heights over the lumbar spine are normal. Possible mild anterior wedging of T11 not adequately evaluated on this exam. IMPRESSION: 1. No acute findings. 2. Mild facet arthropathy over the mid to lower lumbar spine. 3. Possible mild anterior wedging of T11 not adequately evaluated on this exam. Electronically Signed   By:  Toribio Agreste M.D.   On: 09/18/2024 10:35   CT RENAL STONE STUDY Result Date: 09/17/2024 EXAM: CT UROGRAM 09/17/2024 07:55:16 AM TECHNIQUE: CT of the abdomen and pelvis was performed without the administration of intravenous contrast as per CT urogram protocol. Multiplanar reformatted images are provided for review. Automated exposure control, iterative reconstruction, and/or weight based adjustment of the mA/kV was utilized to reduce the radiation dose to as low as reasonably achievable. COMPARISON: None available. CLINICAL HISTORY: Nausea vomiting. Sepsis (HCC). Post-op Problem. CT RENAL STONE STUDY. FINDINGS: LOWER CHEST: Mild left posterior basilar atelectasis or infiltrate is noted. LIVER: Status post cholecystectomy. GALLBLADDER AND BILE DUCTS: Status post cholecystectomy. No biliary ductal dilatation. SPLEEN: No acute abnormality. PANCREAS: Pancreatic atrophy is noted. ADRENAL GLANDS: No acute abnormality. KIDNEYS, URETERS AND BLADDER: Severe right renal atrophy is noted. No stones in the kidneys or ureters. No hydronephrosis.  No perinephric or periureteral stranding. Urinary bladder is unremarkable. GI AND BOWEL: Status post appendectomy. Stomach demonstrates no acute abnormality. There is no bowel obstruction. PERITONEUM AND RETROPERITONEUM: Peritoneal dialysis catheter is noted in the pelvis. Minimal free fluid is noted in the abdomen and pelvis consistent with peritone analysis. VASCULATURE: Aortic atherosclerosis. Aorta is normal in caliber. LYMPH NODES: No lymphadenopathy. REPRODUCTIVE ORGANS: Status post hysterectomy. BONES AND SOFT TISSUES: No acute osseous abnormality. No focal soft tissue abnormality. IMPRESSION: 1. Severe right renal atrophy. 2. Pancreatic atrophy. 3. Mild left posterior basilar atelectasis or infiltrate. Electronically signed by: Lynwood Seip MD 09/17/2024 08:05 AM EDT RP Workstation: [id number]    Principal Problem:   Sepsis (HCC) Active Problems:   ESRD on hemodialysis  (HCC)   DM2 (diabetes mellitus, type 2) (HCC)   Nausea & vomiting   Diabetic foot infection (HCC)   Essential hypertension   Anemia in chronic kidney disease   End stage renal disease on dialysis [facility])   Gangrene of left foot (HCC)   CAD S/P percutaneous coronary angioplasty     Karen [name] Vangie Pike, [facility]  If 7PM-7AM, please contact night-coverage [email or web address]   LOS: 2 days

## 2024-09-18 NOTE — Progress Notes (Signed)
 Patient c/o severe nausea and severe right hip pain.  She would only take 5 mg of PO oxycodone  because she did not like the way 10 mg oxycodone  made her feel.  She states she feels like she pulled something in her right hip while at home and trying to get around with limited mobility due to the surgeries on both of her feet.  She requested lidocaine  patches if possible.  Dr. Shona made aware of the situation.  IV Zofran  ordered and given.  2 Lidocaine  patches ordered and placed on the right hip area.  Suzen Ice RN

## 2024-09-18 NOTE — Progress Notes (Signed)
 [facility] Progress Note   Subjective:   Patient seen and examined at bedside.  Reports severe pain in LE at the end of HD. Requesting prn IV pain medication to get at the end of dialysis.  Plans for BKA on Monday.  Denies CP, SOB, abdominal pain and n/v/d.   Objective Vitals:   09/17/24 1451 09/17/24 1917 09/18/24 0354 09/18/24 0931  BP: (!) 148/62 (!) 152/81 (!) 160/74 100/62  Pulse: 67 71 77 79  Resp: 18 17 16 18   Temp: 98.6 F (37 C) 99.3 F (37.4 C) 98.5 F (36.9 C) 98 F (36.7 C)  TempSrc:  Oral Oral   SpO2: 98% 100% 100% 98%  Weight:      Height:       Physical Exam General:alert female in NAD Heart:RRR, no mrg Lungs:CTAb, nml WOB on RA Abdomen:soft, NTND Extremities:no LE edema, R foot dressed Dialysis Access: LU AVF +b/t   Filed Weights   09/16/24 2200 09/17/24 0936 09/17/24 1316  Weight: 90.5 kg 92.5 kg 90.3 kg    Intake/Output Summary (Last 24 hours) at 09/18/2024 1512 Last data filed at 09/18/2024 0600 Gross per 24 hour  Intake 400.07 ml  Output 0 ml  Net 400.07 ml    Additional Objective Labs: Basic Metabolic Panel: Recent Labs  Lab 09/12/24 0416 09/13/24 0555 09/16/24 1747 09/17/24 0457 09/18/24 0327  NA 131* 134* 136 134* 137  K 4.6 4.7 4.2 4.3 4.4  CL 91* 94* 93* 95* 94*  CO2 27 24 24  21* 27  GLUCOSE 360* 154* 134* 235* 163*  BUN 42* 54* 40* 46* 33*  CREATININE 6.87* 8.77* 6.70* 7.48* 5.42*  CALCIUM  7.8* 8.1* 8.3* 8.6* 8.3*  PHOS 4.9* 6.4*  --   --   --    Liver Function Tests: Recent Labs  Lab 09/16/24 1747 09/17/24 0457 09/18/24 0327  AST 20 16 15   ALT 17 15 14   ALKPHOS 293* 255* 246*  BILITOT 0.8 0.9 0.6  PROT 7.5 7.3 7.2  ALBUMIN  2.6* 2.4* 2.3*   Recent Labs  Lab 09/16/24 1950  LIPASE 23   CBC: Recent Labs  Lab 09/12/24 0416 09/13/24 0555 09/16/24 1747 09/17/24 0457 09/18/24 0327  WBC 8.6 10.4 19.4* 16.4* 12.1*  NEUTROABS 5.3 6.3 16.0*  --   --   HGB 7.8* 7.9* 8.4* 8.7* 8.9*  HCT 24.8*  24.9* 26.8* 27.5* 29.6*  MCV 94.3 92.9 95.0 94.5 96.7  PLT 297 357 511* 495* 572*   Blood Culture    Component Value Date/Time   SDES PERITONEAL FLUID 09/16/2024 2107   SPECREQUEST Immunocompromised 09/16/2024 2107   CULT  09/16/2024 2107    NO GROWTH 1 DAY Performed at [facility], [address] [place], [place] [address]    REPTSTATUS PENDING 09/16/2024 2107    Studies/Results: DG HIP UNILAT WITH PELVIS 1V RIGHT Result Date: 09/18/2024 CLINICAL DATA:  Right hip pain. EXAM: DG HIP (WITH OR WITHOUT PELVIS) 1V RIGHT COMPARISON:  CT 09/17/2024 FINDINGS: There is diffuse decreased bone mineralization present. Peritoneal dialysis catheter with tip over the right lower quadrant. Hips are within normal and symmetric. There is no significant degenerative changes. There is no acute fracture or dislocation. Moderate fecal retention over the rectosigmoid colon. IMPRESSION: No acute findings. Electronically Signed   By: Toribio Agreste M.D.   On: 09/18/2024 10:38   DG Lumbar Spine 1 View Result Date: 09/18/2024 CLINICAL DATA:  Right hip pain. EXAM: LUMBAR SPINE - 1 VIEW COMPARISON:  None  Available. FINDINGS: Vertebral body alignment and heights over the lumbar spine are normal. Mild facet arthropathy over the mid to lower lumbar spine. No evidence of compression fracture or spondylolisthesis involving the lumbar spine. Disc space heights over the lumbar spine are normal. Possible mild anterior wedging of T11 not adequately evaluated on this exam. IMPRESSION: 1. No acute findings. 2. Mild facet arthropathy over the mid to lower lumbar spine. 3. Possible mild anterior wedging of T11 not adequately evaluated on this exam. Electronically Signed   By: Toribio Agreste M.D.   On: 09/18/2024 10:35   CT RENAL STONE STUDY Result Date: 09/17/2024 EXAM: CT UROGRAM 09/17/2024 07:55:16 AM TECHNIQUE: CT of the abdomen and pelvis was performed without the administration of intravenous contrast as per CT urogram  protocol. Multiplanar reformatted images are provided for review. Automated exposure control, iterative reconstruction, and/or weight based adjustment of the mA/kV was utilized to reduce the radiation dose to as low as reasonably achievable. COMPARISON: None available. CLINICAL HISTORY: Nausea vomiting. Sepsis (HCC). Post-op Problem. CT RENAL STONE STUDY. FINDINGS: LOWER CHEST: Mild left posterior basilar atelectasis or infiltrate is noted. LIVER: Status post cholecystectomy. GALLBLADDER AND BILE DUCTS: Status post cholecystectomy. No biliary ductal dilatation. SPLEEN: No acute abnormality. PANCREAS: Pancreatic atrophy is noted. ADRENAL GLANDS: No acute abnormality. KIDNEYS, URETERS AND BLADDER: Severe right renal atrophy is noted. No stones in the kidneys or ureters. No hydronephrosis. No perinephric or periureteral stranding. Urinary bladder is unremarkable. GI AND BOWEL: Status post appendectomy. Stomach demonstrates no acute abnormality. There is no bowel obstruction. PERITONEUM AND RETROPERITONEUM: Peritoneal dialysis catheter is noted in the pelvis. Minimal free fluid is noted in the abdomen and pelvis consistent with peritone analysis. VASCULATURE: Aortic atherosclerosis. Aorta is normal in caliber. LYMPH NODES: No lymphadenopathy. REPRODUCTIVE ORGANS: Status post hysterectomy. BONES AND SOFT TISSUES: No acute osseous abnormality. No focal soft tissue abnormality. IMPRESSION: 1. Severe right renal atrophy. 2. Pancreatic atrophy. 3. Mild left posterior basilar atelectasis or infiltrate. Electronically signed by: Lynwood Seip MD 09/17/2024 08:05 AM EDT RP Workstation: [id number]   DG Foot 2 Views Right Result Date: 09/16/2024 CLINICAL DATA:  Fevers and chills.  Recent postop. EXAM: RIGHT FOOT - 2 VIEW COMPARISON:  09/10/2024 FINDINGS: Again noted is a metatarsal amputation involving the fifth ray. Partial amputation of the third toe. Chronic deformities involving the tarsal bones. Negative for an acute  fracture or dislocation. IMPRESSION: 1. No acute abnormality in the right foot. 2. Chronic deformities involving the tarsal bones. 3. Postsurgical changes. Electronically Signed   By: Juliene Balder M.D.   On: 09/16/2024 18:47   DG Chest Port 1 View Result Date: 09/16/2024 CLINICAL DATA:  Questionable sepsis. Fevers and chills. Recent transmetatarsal amputation. EXAM: PORTABLE CHEST 1 VIEW COMPARISON:  06/21/2024 FINDINGS: Electronic device overlying the lateral left chest. Heart and mediastinum are stable. Prominent central vascular structures. Difficult to exclude mild edema. Chronic scarring at the left lung base. Negative for a pneumothorax. Trachea is midline. IMPRESSION: 1. Probable vascular congestion and difficult to exclude mild edema. 2. Evidence for scarring at the left lung base. Electronically Signed   By: Juliene Balder M.D.   On: 09/16/2024 18:42   DG Foot 2 Views Left Result Date: 09/16/2024 CLINICAL DATA:  History of transmetatarsal amputation. Patient now has fevers and chills. EXAM: LEFT FOOT - 2 VIEW COMPARISON:  None Available. FINDINGS: Status post transmetatarsal amputations. Surgical skin staples are present. Expected postoperative changes. No unexpected cortical destruction or periosteal reaction. Diffuse vascular calcifications. No gross  soft tissue abnormality. No acute fracture. IMPRESSION: Status post transmetatarsal amputations. Expected postoperative changes. No acute bone abnormality. Electronically Signed   By: Juliene Balder M.D.   On: 09/16/2024 18:37    Medications:  ceFEPime (MAXIPIME) IV 1 g (09/17/24 2155)    aspirin  EC  81 mg Oral Daily   brimonidine  1 drop Both Eyes TID   Chlorhexidine  Gluconate Cloth  6 each Topical Q0600   cinacalcet   90 mg Oral Q breakfast   clopidogrel   75 mg Oral Daily   gabapentin   100 mg Oral QHS   gabapentin   200 mg Oral Daily   insulin  pump   Subcutaneous TID WC, HS, 0200   lidocaine   2 patch Transdermal Q24H   metoprolol  tartrate  12.5  mg Oral 2 times per day on Sunday Tuesday Thursday Saturday    Dialysis Orders:  MWF [facility] week -->  4h  B400   90kg 2K bath  AVF  Heparin  none  Assessment/Plan: 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 Planned for Monday.  Reports worsened pain at end of HD.  Requesting IV pain meds to be given at end of treatment prn due to meds being washed out from dialysis.  Pain management per PMD/surgeon.  2. 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  Next HD on 10/20. Will work around surgery schedule.   3. Anemia of CKD- Hgb 8.9 - improving.  Reports missing iron and ESA as outpatient.  Will order to be given here. 4. Secondary hyperparathyroidism - Corrected Ca in goal. Check phos.  Not on VDRA. Continue sensipar .   5. HTN/volume - BP in goal today.  On metoprolol  12.5mg  BID. Does not appear grossly overloaded.  UF as tolerated. Will need lower dry weight on discharge following amputation.  6. Nutrition - Renal diet w/fluid restrictions.   Manuelita Labella, PA-C [facility] 09/18/2024,3:12 PM  LOS: 2 days

## 2024-09-18 NOTE — Progress Notes (Signed)
  0800- Patient endorses aggressive nausea and pain to R hip, patient is diaphoretic and pale, provider notified 1000- Pt returns from XR, PRN medications given, provider aware of morning assessment, denies any further needs  1200- Patient is seen sleeping at this time. States did not eat lunch and did not require insulin  coverage  1400- Patient is seen sleeping at this time. 1600-Patient seen sleeping at this time, eye drops given. 1800- Patient sleep during most of hour, patient requests gabapentin  and states she will attempt to eat food.   Evening Assessment:   This RN recommends: Sx panel to be completed during night shift, monitor patient's BG this was not self monitored throughout shift d/t patient's sleeping  Nurse to Nurse Report: Patient's nausea and PRN, patient's drowsiness, patient's lack of documented coverage   Given to [place], [place]  In the event that [employer] is incomplete, please utilize this progress note for this RN shift assessment of patient. [Documented by exception]   Gwyneth Razor, RN

## 2024-09-19 DIAGNOSIS — M79672 Pain in left foot: Secondary | ICD-10-CM | POA: Diagnosis not present

## 2024-09-19 DIAGNOSIS — I998 Other disorder of circulatory system: Secondary | ICD-10-CM

## 2024-09-19 LAB — GLUCOSE, CAPILLARY
Glucose-Capillary: 140 mg/dL — ABNORMAL HIGH (ref 70–99)
Glucose-Capillary: 176 mg/dL — ABNORMAL HIGH (ref 70–99)
Glucose-Capillary: 185 mg/dL — ABNORMAL HIGH (ref 70–99)
Glucose-Capillary: 187 mg/dL — ABNORMAL HIGH (ref 70–99)
Glucose-Capillary: 200 mg/dL — ABNORMAL HIGH (ref 70–99)
Glucose-Capillary: 260 mg/dL — ABNORMAL HIGH (ref 70–99)
Glucose-Capillary: 292 mg/dL — ABNORMAL HIGH (ref 70–99)

## 2024-09-19 LAB — VANCOMYCIN, RANDOM: Vancomycin Rm: 42 ug/mL

## 2024-09-19 LAB — HEPATITIS B SURFACE ANTIGEN: Hepatitis B Surface Ag: NONREACTIVE

## 2024-09-19 MED ORDER — INSULIN ASPART 100 UNIT/ML IJ SOLN
0.0000 [IU] | INTRAMUSCULAR | Status: DC
  Administered 2024-09-19: 1 [IU] via SUBCUTANEOUS
  Administered 2024-09-19: 3 [IU] via SUBCUTANEOUS
  Administered 2024-09-19: 1 [IU] via SUBCUTANEOUS
  Administered 2024-09-19: 3 [IU] via SUBCUTANEOUS

## 2024-09-19 MED ORDER — VANCOMYCIN HCL IN DEXTROSE 1-5 GM/200ML-% IV SOLN
1000.0000 mg | INTRAVENOUS | Status: DC
  Administered 2024-09-20: 1000 mg via INTRAVENOUS

## 2024-09-19 MED ORDER — NEPRO/CARBSTEADY PO LIQD: 237.0000 mL | ORAL | Status: DC

## 2024-09-19 MED ORDER — INSULIN ASPART 100 UNIT/ML IJ SOLN
5.0000 [IU] | Freq: Three times a day (TID) | INTRAMUSCULAR | Status: DC
  Administered 2024-09-19 (×2): 5 [IU] via SUBCUTANEOUS

## 2024-09-19 MED ORDER — CHLORHEXIDINE GLUCONATE CLOTH 2 % EX PADS
6.0000 | MEDICATED_PAD | Freq: Every day | CUTANEOUS | Status: DC
  Administered 2024-09-19 – 2024-09-21 (×3): 6 via TOPICAL

## 2024-09-19 MED ORDER — INSULIN ASPART 100 UNIT/ML IJ SOLN: 0.0000 [IU] | INTRAMUSCULAR | Status: DC

## 2024-09-19 MED ORDER — NEPRO/CARBSTEADY PO LIQD: 237.0000 mL | Freq: Two times a day (BID) | ORAL | Status: DC

## 2024-09-19 MED ORDER — PROSOURCE PLUS PO LIQD
30.0000 mL | Freq: Two times a day (BID) | ORAL | Status: DC
  Administered 2024-09-19 – 2024-09-29 (×14): 30 mL via ORAL
  Filled 2024-09-19 (×13): qty 30

## 2024-09-19 NOTE — Progress Notes (Signed)
 [facility]  Subjective  -no complaints   Objective (!) 142/63 88 98.7 F (37.1 C) 18 96%  Intake/Output Summary (Last 24 hours) at 09/19/2024 0949 Last data filed at 09/19/2024 0600 Gross per 24 hour  Intake 360 ml  Output 0 ml  Net 360 ml    Ischemic left TMA  Laboratory Lab Results: Recent Labs    09/17/24 0457 09/18/24 0327  WBC 16.4* 12.1*  HGB 8.7* 8.9*  HCT 27.5* 29.6*  PLT 495* 572*   BMET Recent Labs    09/17/24 0457 09/18/24 0327  NA 134* 137  K 4.3 4.4  CL 95* 94*  CO2 21* 27  GLUCOSE 235* 163*  BUN 46* 33*  CREATININE 7.48* 5.42*  CALCIUM  8.6* 8.3*    COAG Lab Results  Component Value Date   INR 1.1 09/16/2024   INR 1.1 09/06/2024   No results found for: PTT  Assessment/Planning:  48 year old female with ischemic left TMA.  Plan for left BKA tomorrow with vascular.  N.p.o. at midnight.  Consent order placed.  Karen [name] 09/19/2024 9:49 AM --

## 2024-09-19 NOTE — Progress Notes (Signed)
 Nurse to Nurse Report: Patient required many doses of PRN pain medications. Patient received missed morning medications. Patient was able to consume a decent amount of food throughout shift. Patient is Q4H CBG at this time d/t to Insulin  and pump supply expiring during shift. We are now administering short acting medication to patient. Patient still needs surgical panel collected.   Received From: Suzen, RN Neuro- Previous nurse reports no changes  HEENT- Previous nurse reports no changes  CV- Previous nurse reports no changes  Respiratory- Previous nurse reports no changes  GI- Previous nurse reports no changes  GU- Previous nurse reports no changes  Reproduction- Previous nurse reports no changes  MSK- Previous nurse reports no changes  Skin- Previous nurse reports no changes  Behavior- Patient required several doses of pain medication. 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 Judeth notified: instructed this nurse to consult with diabetes coordinator  [phone]: Shannan RN Diabetes Coordinator paged and responded concerning patients insulin  and pump materials not being available until tonight. Patient is displeased about SSI coverage  amount. Believes coverage should be increased. Per Clotilda 5 units of Novalog meal coverage will be added to the established SSI.  face to face report given to Dr. Judeth, all questions answered: see new orders  1200- Lunch delivered and eaten, medication given  1400- Medications given  1600- Medication given, patient eating at this time, CP messaged about sending Novalog for patient's pump refill (per patient's request) states sister is en route with materials.  Per pharmacy, insulin  from floor stock to be used for patient's pump. Secretary to order 2 vials of 100u/vial as this floor does not have adequate supply of insuline. Patient states she requires 180units to refill pump. 1800- Patient given PRN pain medication, aside from insulin  requirement, denies any further needs at this time  Evening Assessment: No change from previous assessment  This RN recommends: Follow up with insulin  for pump, sx panel collection, bowel regiment for possible constipation, continued pain regiment per ordered.  Nurse to Nurse Report: Patient is scheduled for surgery, still requires sx panel. Per pharmacy, insulin  for patient's pump is to be taken from floor stock, patient still requires ordered coverage until pump is working properly. Patient has not had BM for this nurse in two days.  Given to Barbie, RN  In the event that [employer] is incomplete, please utilize this progress note for this RN shift assessment of patient. [Documented by exception]   Gwyneth Razor, RN

## 2024-09-19 NOTE — Progress Notes (Signed)
 [facility] Progress Note   Subjective:   Patient seen and examined at bedside.  Reports ongoing pain in hip from an injury prior to hospitalization.  Agreed to have dialysis after surgery tomorrow. No other specific complaints. Denies CP, SOB, abdominal pain and n/v/d.  Objective Vitals:   09/18/24 1653 09/18/24 2030 09/19/24 0429 09/19/24 0941  BP: 122/60 125/63 (!) 135/52 (!) 142/63  Pulse: 67 79 79 88  Resp: 18 18 18 18   Temp: 98.4 F (36.9 C) 98.6 F (37 C) 98.6 F (37 C) 98.7 F (37.1 C)  TempSrc:  Oral    SpO2: 97% 97%  96%  Weight:      Height:       Physical Exam General:alert, pleasant female in NAD Heart:RRR Lungs:CTAB, nml WOB on RA Abdomen:soft, NTND Extremities:no LE edema, dressing on b/l feet Dialysis Access: LU AVF   Filed Weights   09/16/24 2200 09/17/24 0936 09/17/24 1316  Weight: 90.5 kg 92.5 kg 90.3 kg    Intake/Output Summary (Last 24 hours) at 09/19/2024 1049 Last data filed at 09/19/2024 0600 Gross per 24 hour  Intake 360 ml  Output 0 ml  Net 360 ml    Additional Objective Labs: Basic Metabolic Panel: Recent Labs  Lab 09/13/24 0555 09/16/24 1747 09/17/24 0457 09/18/24 0327  NA 134* 136 134* 137  K 4.7 4.2 4.3 4.4  CL 94* 93* 95* 94*  CO2 24 24 21* 27  GLUCOSE 154* 134* 235* 163*  BUN 54* 40* 46* 33*  CREATININE 8.77* 6.70* 7.48* 5.42*  CALCIUM  8.1* 8.3* 8.6* 8.3*  PHOS 6.4*  --   --   --    Liver Function Tests: Recent Labs  Lab 09/16/24 1747 09/17/24 0457 09/18/24 0327  AST 20 16 15   ALT 17 15 14   ALKPHOS 293* 255* 246*  BILITOT 0.8 0.9 0.6  PROT 7.5 7.3 7.2  ALBUMIN  2.6* 2.4* 2.3*   Recent Labs  Lab 09/16/24 1950  LIPASE 23   CBC: Recent Labs  Lab 09/13/24 0555 09/16/24 1747 09/17/24 0457 09/18/24 0327  WBC 10.4 19.4* 16.4* 12.1*  NEUTROABS 6.3 16.0*  --   --   HGB 7.9* 8.4* 8.7* 8.9*  HCT 24.9* 26.8* 27.5* 29.6*  MCV 92.9 95.0 94.5 96.7  PLT 357 511* 495* 572*   CBG: Recent Labs   Lab 09/18/24 1654 09/18/24 2032 09/19/24 0102 09/19/24 0405 09/19/24 0836  GLUCAP 92 158* 187* 200* 176*    Studies/Results: DG HIP UNILAT WITH PELVIS 1V RIGHT Result Date: 09/18/2024 CLINICAL DATA:  Right hip pain. EXAM: DG HIP (WITH OR WITHOUT PELVIS) 1V RIGHT COMPARISON:  CT 09/17/2024 FINDINGS: There is diffuse decreased bone mineralization present. Peritoneal dialysis catheter with tip over the right lower quadrant. Hips are within normal and symmetric. There is no significant degenerative changes. There is no acute fracture or dislocation. Moderate fecal retention over the rectosigmoid colon. IMPRESSION: No acute findings. Electronically Signed   By: Toribio Agreste M.D.   On: 09/18/2024 10:38   DG Lumbar Spine 1 View Result Date: 09/18/2024 CLINICAL DATA:  Right hip pain. EXAM: LUMBAR SPINE - 1 VIEW COMPARISON:  None Available. FINDINGS: Vertebral body alignment and heights over the lumbar spine are normal. Mild facet arthropathy over the mid to lower lumbar spine. No evidence of compression fracture or spondylolisthesis involving the lumbar spine. Disc space heights over the lumbar spine are normal. Possible mild anterior wedging of T11 not adequately evaluated on this exam. IMPRESSION: 1. No acute findings.  2. Mild facet arthropathy over the mid to lower lumbar spine. 3. Possible mild anterior wedging of T11 not adequately evaluated on this exam. Electronically Signed   By: Toribio Agreste M.D.   On: 09/18/2024 10:35    Medications:  ceFEPime (MAXIPIME) IV 1 g (09/18/24 2217)   [START ON 09/20/2024] vancomycin       aspirin  EC  81 mg Oral Daily   brimonidine  1 drop Both Eyes TID   Chlorhexidine  Gluconate Cloth  6 each Topical Q0600   cinacalcet   90 mg Oral Q breakfast   clopidogrel   75 mg Oral Daily   feeding supplement (NEPRO CARB STEADY)  237 mL Oral Q24H   gabapentin   100 mg Oral QHS   gabapentin   200 mg Oral Daily   insulin  aspart  0-6 Units Subcutaneous Q4H   insulin  pump    Subcutaneous TID WC, HS, 0200   lidocaine   2 patch Transdermal Q24H   metoprolol  tartrate  12.5 mg Oral 2 times per day on Sunday Tuesday Thursday Saturday    Dialysis Orders: MWF [facility] From last week -->  4h  B400   90kg 2K bath  AVF  Heparin  none   Assessment/Plan: 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 Planned for Monday.  Reports worsened pain at end of HD.  Requesting IV pain meds to be given at end of treatment prn due to meds being washed out from dialysis.  Pain management per PMD/surgeon - Plan for prn IV fentanyl  post HD.  2. 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  Next HD on 10/20. Plan for HD after surgery.  3. Anemia of CKD- Hgb 8.9 - improving.  Reports missing iron and ESA as outpatient.  Will order to be given here.  No IV iron with current infection.  4. Secondary hyperparathyroidism - Corrected Ca in goal. Check phos.  Not on VDRA. Continue sensipar .   5. HTN/volume - BP in goal today.  On metoprolol  12.5mg  BID. Does not appear grossly overloaded.  UF as tolerated. Will need lower dry weight on discharge following amputation.  6. Nutrition - Renal diet w/fluid restrictions. Albumin  low, add protein supplements.   Manuelita Labella, PA-C [facility] 09/19/2024,10:49 AM  LOS: 3 days

## 2024-09-19 NOTE — Progress Notes (Signed)
 PROGRESS NOTE   Karen [name]  FMW:[id number]    DOB: 09-Jul-1976    DOA: 09/16/2024  PCP: Lesa Jon HERO, PA   I have briefly reviewed patients previous medical records in [facility].   Brief Hospital Course:   48 y.o. 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  In ER patient was noted to have a fever, tachycardia, leukocytosis and discolored transmetatarsal flap.  Patient was started on empiric antibiotics.  Patient was seen by vascular surgery who recommended BKA which is planned for 10/20.  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 PAD S/p DCB angioplasty of left popliteal artery last week As per podiatry input 10/17, significant necrosis of left foot plantar flap from recent surgery.  They recommended vascular surgery consultation for possible higher amputation On careful review of chart since admission, did not meet criteria for sepsis.  Despite prior documentations, sepsis ruled out. Vascular surgery follow-up appreciated, plan for left BKA 10/20. Continue empirically started IV vancomycin  and cefepime.  Postop day antibiotics, choice and duration deferred to vascular surgery. Remains on aspirin  and Plavix .  Intolerant to statins.  Nausea and vomiting Resolved  Type II DM/IDDM on insulin  pump, controlled Diabetic  peripheral neuropathy A1c 6.5 on 10/6. Per patient and nursing report, insulin  pump was not functioning correctly and hence currently on SSI and as per DM coordinator input, added mealtime NovoLog  5 units 3 times daily.  She is supposed to start using her insulin  pump later tonight and mealtime NovoLog  can be discontinued.  Discussed with RN. Continue current dose of gabapentin .   ESRD on MWF HD On HD per nephrology, next HD planned for 10/20 after surgery.  Right hip pain Patient feels that she twisted her hip at home.  X-rays right hip and lumbar spine without acute findings. Continue multimodality pain control including gabapentin , lidocaine  patch.  Requested PT evaluation.  Monitor.  Essential hypertension Controlled on metoprolol  to tartrate 12.5 mg twice daily on nondialysis days.  Anemia of ESRD Stable.  S/p right third toe amputation Healing well as per podiatry follow-up 10/17.  They changed dressing that day and do not recommend change of dressing for another week or so and if it is changed would recommend Xeroform 4 x 4 gauze Kerlix Ace roll.  Sutures will come out in 1 to 2 weeks.  WBAT on right foot in postop shoe.  Outpatient follow-up with Dr. Malvin.  Body mass index is 33.13 kg/m./Class I obesity Complicates care.   DVT prophylaxis:   Start subcutaneous heparin  postop pending vascular surgery clearance.   Code Status: Full Code:  Family Communication: None at bedside. Disposition:  Status is: Inpatient Remains inpatient appropriate because: Awaiting left BKA.     Consultants:   Podiatry Vascular surgery Nephrology  Procedures:   As above  Subjective:  Right hip pain, slightly better after lidocaine  patches.  Asking PT evaluation, ordered.  Peripheral  neuropathy pain in bilateral lower extremities with insensate neuropathy.  Objective:   Vitals:   09/18/24 1653 09/18/24 2030 09/19/24 0429 09/19/24 0941  BP: 122/60 125/63 (!) 135/52 (!) 142/63   Pulse: 67 79 79 88  Resp: 18 18 18 18   Temp: 98.4 F (36.9 C) 98.6 F (37 C) 98.6 F (37 C) 98.7 F (37.1 C)  TempSrc:  Oral    SpO2: 97% 97%  96%  Weight:      Height:        General exam: Young female, moderately built and obese lying comfortably propped up in bed without distress. Respiratory system: Clear to auscultation. Respiratory effort normal. Cardiovascular system: S1 & S2 heard, RRR. No JVD, murmurs, rubs, gallops or clicks. No pedal edema.  Telemetry personally reviewed: Sinus rhythm. Gastrointestinal system: Abdomen is nondistended, soft and nontender. No organomegaly or masses felt. Normal bowel sounds heard. Central nervous system: Alert and oriented. No focal neurological deficits. Extremities: Symmetric 5 x 5 power.  Left and right foot dressing clean and dry.  Did not remove dressing to examine today. Skin: No rashes, lesions or ulcers Psychiatry: Judgement and insight appear normal. Mood & affect appropriate.     Data Reviewed:   I have personally reviewed following labs and imaging studies   CBC: Recent Labs  Lab 09/13/24 0555 09/16/24 1747 09/17/24 0457 09/18/24 0327  WBC 10.4 19.4* 16.4* 12.1*  NEUTROABS 6.3 16.0*  --   --   HGB 7.9* 8.4* 8.7* 8.9*  HCT 24.9* 26.8* 27.5* 29.6*  MCV 92.9 95.0 94.5 96.7  PLT 357 511* 495* 572*    Basic Metabolic Panel: Recent Labs  Lab 09/13/24 0555 09/16/24 1747 09/17/24 0457 09/18/24 0327  NA 134* 136 134* 137  K 4.7 4.2 4.3 4.4  CL 94* 93* 95* 94*  CO2 24 24 21* 27  GLUCOSE 154* 134* 235* 163*  BUN 54* 40* 46* 33*  CREATININE 8.77* 6.70* 7.48* 5.42*  CALCIUM  8.1* 8.3* 8.6* 8.3*  PHOS 6.4*  --   --   --     Liver Function Tests: Recent Labs  Lab 09/13/24 0555 09/16/24 1747 09/17/24 0457 09/18/24 0327  AST  --  20 16 15   ALT  --  17 15 14   ALKPHOS  --  293* 255* 246*  BILITOT  --  0.8 0.9 0.6  PROT  --  7.5 7.3 7.2  ALBUMIN  2.0* 2.6* 2.4* 2.3*    CBG: Recent Labs  Lab 09/19/24 0405  09/19/24 0836 09/19/24 1202  GLUCAP 200* 176* 292*    Microbiology Studies:   Recent Results (from the past 240 hours)  Aerobic/Anaerobic Culture w Gram Stain (surgical/deep wound)     Status: None   Collection Time: 09/10/24  8:00 AM   Specimen: Foot, Left; Tissue  Result Value Ref Range Status   Specimen Description TISSUE  Final   Special Requests LEFT FOREFOOT  Final   Gram Stain NO WBC SEEN FEW GRAM POSITIVE COCCI IN PAIRS   Final   Culture   Final    ABUNDANT MORGANELLA MORGANII ABUNDANT PROTEUS MIRABILIS MODERATE ENTEROCOCCUS FAECALIS NO ANAEROBES ISOLATED Performed at [facility], [address], [place], [place] [address]    Report Status 09/17/2024 FINAL  Final   Organism ID, Bacteria MORGANELLA MORGANII  Final   Organism ID, Bacteria PROTEUS MIRABILIS  Final   Organism ID, Bacteria ENTEROCOCCUS FAECALIS  Final      Susceptibility   Enterococcus faecalis - MIC*  AMPICILLIN <=2 SENSITIVE Sensitive     VANCOMYCIN  1 SENSITIVE Sensitive     GENTAMICIN SYNERGY RESISTANT Resistant     * MODERATE ENTEROCOCCUS FAECALIS   Morganella morganii - MIC*    AMPICILLIN >=32 RESISTANT Resistant     ERTAPENEM <=0.12 SENSITIVE Sensitive     CIPROFLOXACIN  <=0.06 SENSITIVE Sensitive     GENTAMICIN <=1 SENSITIVE Sensitive     MEROPENEM <=0.25 SENSITIVE Sensitive     TRIMETH/SULFA <=20 SENSITIVE Sensitive     AMPICILLIN/SULBACTAM >=32 RESISTANT Resistant     PIP/TAZO Value in next row Sensitive      <=4 SENSITIVEThis is a modified FDA-approved test that has been validated and its performance characteristics determined by the reporting laboratory.  This laboratory is certified under the Clinical Laboratory Improvement Amendments CLIA as qualified to perform high complexity clinical laboratory testing.    * ABUNDANT MORGANELLA MORGANII   Proteus mirabilis - MIC*    AMPICILLIN Value in next row Sensitive      <=4 SENSITIVEThis is a modified FDA-approved test that has been  validated and its performance characteristics determined by the reporting laboratory.  This laboratory is certified under the Clinical Laboratory Improvement Amendments CLIA as qualified to perform high complexity clinical laboratory testing.    CEFAZOLIN  (NON-URINE) Value in next row Intermediate      <=4 SENSITIVEThis is a modified FDA-approved test that has been validated and its performance characteristics determined by the reporting laboratory.  This laboratory is certified under the Clinical Laboratory Improvement Amendments CLIA as qualified to perform high complexity clinical laboratory testing.    CEFEPIME Value in next row Sensitive      <=4 SENSITIVEThis is a modified FDA-approved test that has been validated and its performance characteristics determined by the reporting laboratory.  This laboratory is certified under the Clinical Laboratory Improvement Amendments CLIA as qualified to perform high complexity clinical laboratory testing.    ERTAPENEM Value in next row Sensitive      <=4 SENSITIVEThis is a modified FDA-approved test that has been validated and its performance characteristics determined by the reporting laboratory.  This laboratory is certified under the Clinical Laboratory Improvement Amendments CLIA as qualified to perform high complexity clinical laboratory testing.    CEFTRIAXONE  Value in next row Sensitive      <=4 SENSITIVEThis is a modified FDA-approved test that has been validated and its performance characteristics determined by the reporting laboratory.  This laboratory is certified under the Clinical Laboratory Improvement Amendments CLIA as qualified to perform high complexity clinical laboratory testing.    CIPROFLOXACIN  Value in next row Sensitive      <=4 SENSITIVEThis is a modified FDA-approved test that has been validated and its performance characteristics determined by the reporting laboratory.  This laboratory is certified under the Clinical Laboratory  Improvement Amendments CLIA as qualified to perform high complexity clinical laboratory testing.    GENTAMICIN Value in next row Sensitive      <=4 SENSITIVEThis is a modified FDA-approved test that has been validated and its performance characteristics determined by the reporting laboratory.  This laboratory is certified under the Clinical Laboratory Improvement Amendments CLIA as qualified to perform high complexity clinical laboratory testing.    MEROPENEM Value in next row Sensitive      <=4 SENSITIVEThis is a modified FDA-approved test that has been validated and its performance characteristics determined by the reporting laboratory.  This laboratory is certified under the Clinical Laboratory Improvement Amendments CLIA as qualified to perform high complexity clinical laboratory  testing.    TRIMETH/SULFA Value in next row Resistant      <=4 SENSITIVEThis is a modified FDA-approved test that has been validated and its performance characteristics determined by the reporting laboratory.  This laboratory is certified under the Clinical Laboratory Improvement Amendments CLIA as qualified to perform high complexity clinical laboratory testing.    AMPICILLIN/SULBACTAM Value in next row Sensitive      <=4 SENSITIVEThis is a modified FDA-approved test that has been validated and its performance characteristics determined by the reporting laboratory.  This laboratory is certified under the Clinical Laboratory Improvement Amendments CLIA as qualified to perform high complexity clinical laboratory testing.    PIP/TAZO Value in next row Sensitive      <=4 SENSITIVEThis is a modified FDA-approved test that has been validated and its performance characteristics determined by the reporting laboratory.  This laboratory is certified under the Clinical Laboratory Improvement Amendments CLIA as qualified to perform high complexity clinical laboratory testing.    * ABUNDANT PROTEUS MIRABILIS  Culture, blood (Routine x  2)     Status: None (Preliminary result)   Collection Time: 09/16/24  5:10 PM   Specimen: BLOOD RIGHT ARM  Result Value Ref Range Status   Specimen Description BLOOD RIGHT ARM  Final   Special Requests   Final    BOTTLES DRAWN AEROBIC AND ANAEROBIC Blood Culture results may not be optimal due to an inadequate volume of blood received in culture bottles   Culture   Final    NO GROWTH 3 DAYS Performed at [facility], [address] [place], [place] [address]    Report Status PENDING  Incomplete  Culture, blood (Routine x 2)     Status: None (Preliminary result)   Collection Time: 09/16/24  5:42 PM   Specimen: BLOOD RIGHT ARM  Result Value Ref Range Status   Specimen Description BLOOD RIGHT ARM  Final   Special Requests   Final    BOTTLES DRAWN AEROBIC AND ANAEROBIC Blood Culture results may not be optimal due to an inadequate volume of blood received in culture bottles   Culture   Final    NO GROWTH 3 DAYS Performed at [facility], [address], [place], [place] [address]    Report Status PENDING  Incomplete  Body fluid culture w Gram Stain     Status: None (Preliminary result)   Collection Time: 09/16/24  9:07 PM   Specimen: Peritoneal Washings; Body Fluid  Result Value Ref Range Status   Specimen Description PERITONEAL FLUID  Final   Special Requests Immunocompromised  Final   Gram Stain NO ORGANISMS SEEN NO WBC SEEN CYTOSPIN SMEAR   Final   Culture   Final    NO GROWTH 2 DAYS Performed at [facility], [address], [place], [place] [address]    Report Status PENDING  Incomplete  MRSA Next Gen by PCR, Nasal     Status: None   Collection Time: 09/17/24  4:48 AM   Specimen: Nasal Mucosa; Nasal Swab  Result Value Ref Range Status   MRSA by PCR Next Gen NOT DETECTED NOT DETECTED Final    Comment: (NOTE) The GeneXpert MRSA Assay (FDA approved for NASAL specimens only), is one component of a comprehensive MRSA colonization surveillance program. It is  not intended to diagnose MRSA infection nor to guide or monitor treatment for MRSA infections. Test performance is not FDA approved in patients less than 58 years old. Performed at [facility], [address] [place], [place] [address]     Radiology Studies:  DG HIP UNILAT WITH PELVIS 1V RIGHT Result Date: 09/18/2024 CLINICAL DATA:  Right hip pain. EXAM: DG HIP (WITH OR WITHOUT PELVIS) 1V RIGHT COMPARISON:  CT 09/17/2024 FINDINGS: There is diffuse decreased bone mineralization present. Peritoneal dialysis catheter with tip over the right lower quadrant. Hips are within normal and symmetric. There is no significant degenerative changes. There is no acute fracture or dislocation. Moderate fecal retention over the rectosigmoid colon. IMPRESSION: No acute findings. Electronically Signed   By: Toribio Agreste M.D.   On: 09/18/2024 10:38   DG Lumbar Spine 1 View Result Date: 09/18/2024 CLINICAL DATA:  Right hip pain. EXAM: LUMBAR SPINE - 1 VIEW COMPARISON:  None Available. FINDINGS: Vertebral body alignment and heights over the lumbar spine are normal. Mild facet arthropathy over the mid to lower lumbar spine. No evidence of compression fracture or spondylolisthesis involving the lumbar spine. Disc space heights over the lumbar spine are normal. Possible mild anterior wedging of T11 not adequately evaluated on this exam. IMPRESSION: 1. No acute findings. 2. Mild facet arthropathy over the mid to lower lumbar spine. 3. Possible mild anterior wedging of T11 not adequately evaluated on this exam. Electronically Signed   By: Toribio Agreste M.D.   On: 09/18/2024 10:35    Scheduled Meds:    (feeding supplement) PROSource Plus  30 mL Oral BID BM   aspirin  EC  81 mg Oral Daily   brimonidine  1 drop Both Eyes TID   Chlorhexidine  Gluconate Cloth  6 each Topical Q0600   Chlorhexidine  Gluconate Cloth  6 each Topical Q0600   cinacalcet   90 mg Oral Q breakfast   clopidogrel   75 mg Oral Daily   gabapentin    100 mg Oral QHS   gabapentin   200 mg Oral Daily   insulin  aspart  0-6 Units Subcutaneous Q4H   insulin  aspart  5 Units Subcutaneous TID WC   insulin  pump   Subcutaneous TID WC, HS, 0200   lidocaine   2 patch Transdermal Q24H   metoprolol  tartrate  12.5 mg Oral 2 times per day on Sunday Tuesday Thursday Saturday    Continuous Infusions:    ceFEPime (MAXIPIME) IV 1 g (09/18/24 2217)   [START ON 09/20/2024] vancomycin        LOS: 3 days     Trenda Mar, MD,  FACP, [facility], [facility], [facility]   [facility] [place]      To contact the attending provider between 7A-7P or the covering provider during after hours 7P-7A, please log into the web site [email or web address] and access using universal [place] password for that web site. If you do not have the password, please call the hospital operator.  09/19/2024, 3:25 PM

## 2024-09-19 NOTE — Anesthesia Preprocedure Evaluation (Signed)
 Anesthesia Evaluation  Patient identified by MRN, date of birth, ID band Patient awake    Reviewed: Allergy & Precautions, NPO status , Patient's Chart, lab work & pertinent test results  History of Anesthesia Complications Negative for: history of anesthetic complications  Airway Mallampati: III  TM Distance: >3 FB Neck ROM: Full    Dental  (+) Dental Advisory Given   Pulmonary neg shortness of breath, asthma , sleep apnea , neg COPD, neg recent URI, former smoker Lung nodule, s/p partial lung removal   Pulmonary exam normal breath sounds clear to auscultation       Cardiovascular hypertension (metoprolol ), Pt. on home beta blockers (-) angina + CAD, + Past MI and + Cardiac Stents (01/27/2024)   Rhythm:Regular Rate:Normal  H/o endocarditis  TTE 07/14/2024: IMPRESSIONS    1. Left ventricular ejection fraction, by estimation, is 50 to 55%. The  left ventricle has low normal function. The left ventricle has no regional  wall motion abnormalities. Left ventricular diastolic parameters are  consistent with Grade I diastolic  dysfunction (impaired relaxation). The average left ventricular global  longitudinal strain is -21.6 %. The global longitudinal strain is normal.   2. Right ventricular systolic function is normal. The right ventricular  size is normal.   3. The mitral valve is normal in structure. No evidence of mitral valve  regurgitation. No evidence of mitral stenosis.   4. The aortic valve is tricuspid. There is mild calcification of the  aortic valve. Aortic valve regurgitation is not visualized. No aortic  stenosis is present.   5. The inferior vena cava is normal in size with greater than 50%  respiratory variability, suggesting right atrial pressure of 3 mmHg.   LHC 01/27/2024:   Prox RCA to Mid RCA lesion is 20% stenosed.   Ost Cx to Prox Cx lesion is 20% stenosed.   Dist Cx lesion is 99% stenosed.   Mid LAD  lesion is 99% stenosed.   Mid LAD to Dist LAD lesion is 30% stenosed.   A drug-eluting stent was successfully placed using a SYNERGY XD [id number].   Post intervention, there is a 0% residual stenosis.     Neuro/Psych  Headaches, neg Seizures PSYCHIATRIC DISORDERS (ADHD)       Neuromuscular disease (chronic low back pain)    GI/Hepatic ,GERD  ,,(+)     substance abuse  marijuana useChronic pancreatitis   Endo/Other  diabetes, Type 2, Insulin  Dependent  Secondary hyperparathyroidism  Renal/GU ESRF and DialysisRenal disease (last HD 10/17, K 5.9 today)     Musculoskeletal  (+) Arthritis ,  Fibromyalgia -  Abdominal  (+) + obese  Peds  Hematology  (+) Blood dyscrasia, anemia Lab Results      Component                Value               Date                      WBC                      10.1                09/20/2024                HGB                      8.0 (L)  09/20/2024                HCT                      26.0 (L)            09/20/2024                MCV                      96.7                09/20/2024                PLT                      422 (H)             09/20/2024              Anesthesia Other Findings On Plavix  - last dose yesterday  Blind in left eye, wheelchair dependent  Reproductive/Obstetrics                              Anesthesia Physical Anesthesia Plan  ASA: 4  Anesthesia Plan: MAC and Regional   Post-op Pain Management: Regional block* and Tylenol  PO (pre-op)*   Induction: Intravenous  PONV Risk Score and Plan: 2 and Propofol  infusion, Ondansetron , Midazolam , TIVA and Treatment may vary due to age or medical condition  Airway Management Planned: Natural Airway and Simple Face Mask  Additional Equipment:   Intra-op Plan:   Post-operative Plan:   Informed Consent: I have reviewed the patients History and Physical, chart, labs and discussed the procedure including the risks, benefits and  alternatives for the proposed anesthesia with the patient or authorized representative who has indicated his/her understanding and acceptance.     Dental advisory given  Plan Discussed with: CRNA and Anesthesiologist  Anesthesia Plan Comments: (Discussed potential risks of nerve blocks including, but not limited to, infection, bleeding, nerve damage, seizures, pneumothorax, respiratory depression, and potential failure of the block. Alternatives to nerve blocks discussed. All questions answered.  Discussed with patient risks of MAC including, but not limited to, minor pain or discomfort, hearing people in the room, and possible need for backup general anesthesia. Risks for general anesthesia also discussed including, but not limited to, sore throat, hoarse voice, chipped/damaged teeth, injury to vocal cords, nausea and vomiting, allergic reactions, lung infection, heart attack, stroke, and death. All questions answered. )         Anesthesia Quick Evaluation

## 2024-09-19 NOTE — Inpatient Diabetes Management (Signed)
 Inpatient Diabetes Program Recommendations  AACE/ADA: New Consensus Statement on Inpatient Glycemic Control   Target Ranges:  Prepandial:   less than 140 mg/dL      Peak postprandial:   less than 180 mg/dL (1-2 hours)      Critically ill patients:  140 - 180 mg/dL   Lab Results  Component Value Date   GLUCAP 176 (H) 09/19/2024   HGBA1C 6.5 (H) 09/06/2024    Review of Glycemic Control Diabetes history: DM2  Outpatient Diabetes medications:   Patient sees Dr Hosie at [facility]. Last seen on 08/31/24.  Per Office visit note - patient uses Tandem t:slim with Control-IQ and was in range 80% of the time.   INSULIN  PUMP SETTINGS (Tandem t:slim X2):  INSULIN  SENSITIVITY:  45  BASAL RATES: 00:00 0.60  CARB RATIO: 00:00 13  CORRECTION FACTOR: 00:00 50  TARGET BLOOD GLUCOSE: 00:00 110  TOTAL DAILY INSULIN  = 29 units (46% basal; 54% bolus)   Inpatient Diabetes Program Recommendations:  Not using pump at the moment until this evening. When pump supplies come d/c all SQ orders leave insulin  pump order set.  For now, Novolog  0-6 units tid, -   Add Novolog  5 units tid meal coverage in addition to correction scale if eating >50% of meals.   Thanks,  Clotilda Bull RN, MSN, BC-ADM Inpatient Diabetes Coordinator Team Pager [phone] (8a-5p)

## 2024-09-20 ENCOUNTER — Inpatient Hospital Stay (HOSPITAL_COMMUNITY): Payer: Self-pay | Admitting: Anesthesiology

## 2024-09-20 ENCOUNTER — Encounter (HOSPITAL_COMMUNITY): Payer: Self-pay | Admitting: Internal Medicine

## 2024-09-20 ENCOUNTER — Encounter (HOSPITAL_COMMUNITY)
Admission: EM | Disposition: A | Discharge status: Home Health | Payer: Self-pay | Source: Home / Self Care | Attending: Family Medicine

## 2024-09-20 DIAGNOSIS — T8789 Other complications of amputation stump: Secondary | ICD-10-CM

## 2024-09-20 DIAGNOSIS — I998 Other disorder of circulatory system: Secondary | ICD-10-CM | POA: Diagnosis not present

## 2024-09-20 DIAGNOSIS — I2510 Atherosclerotic heart disease of native coronary artery without angina pectoris: Secondary | ICD-10-CM

## 2024-09-20 DIAGNOSIS — I70222 Atherosclerosis of native arteries of extremities with rest pain, left leg: Secondary | ICD-10-CM | POA: Diagnosis not present

## 2024-09-20 DIAGNOSIS — I10 Essential (primary) hypertension: Secondary | ICD-10-CM

## 2024-09-20 DIAGNOSIS — M79672 Pain in left foot: Secondary | ICD-10-CM | POA: Diagnosis not present

## 2024-09-20 DIAGNOSIS — Z87891 Personal history of nicotine dependence: Secondary | ICD-10-CM

## 2024-09-20 DIAGNOSIS — Z89432 Acquired absence of left foot: Secondary | ICD-10-CM

## 2024-09-20 HISTORY — PX: AMPUTATION: SHX166

## 2024-09-20 LAB — CBC
HCT: 26 % — ABNORMAL LOW (ref 36.0–46.0)
Hemoglobin: 8 g/dL — ABNORMAL LOW (ref 12.0–15.0)
MCH: 29.7 pg (ref 26.0–34.0)
MCHC: 30.8 g/dL (ref 30.0–36.0)
MCV: 96.7 fL (ref 80.0–100.0)
Platelets: 422 K/uL — ABNORMAL HIGH (ref 150–400)
RBC: 2.69 MIL/uL — ABNORMAL LOW (ref 3.87–5.11)
RDW: 13.6 % (ref 11.5–15.5)
WBC: 10.1 K/uL (ref 4.0–10.5)
nRBC: 0 % (ref 0.0–0.2)

## 2024-09-20 LAB — BASIC METABOLIC PANEL WITH GFR
Anion gap: 14 (ref 5–15)
BUN: 69 mg/dL — ABNORMAL HIGH (ref 6–20)
CO2: 25 mmol/L (ref 22–32)
Calcium: 8.4 mg/dL — ABNORMAL LOW (ref 8.9–10.3)
Chloride: 95 mmol/L — ABNORMAL LOW (ref 98–111)
Creatinine, Ser: 9.54 mg/dL — ABNORMAL HIGH (ref 0.44–1.00)
GFR, Estimated: 5 mL/min — ABNORMAL LOW (ref >60)
Glucose, Bld: 138 mg/dL — ABNORMAL HIGH (ref 70–99)
Potassium: 5.9 mmol/L — ABNORMAL HIGH (ref 3.5–5.1)
Sodium: 134 mmol/L — ABNORMAL LOW (ref 135–145)

## 2024-09-20 LAB — BODY FLUID CULTURE W GRAM STAIN
Culture: NO GROWTH
Gram Stain: NONE SEEN

## 2024-09-20 LAB — GLUCOSE, CAPILLARY
Glucose-Capillary: 125 mg/dL — ABNORMAL HIGH (ref 70–99)
Glucose-Capillary: 130 mg/dL — ABNORMAL HIGH (ref 70–99)
Glucose-Capillary: 131 mg/dL — ABNORMAL HIGH (ref 70–99)
Glucose-Capillary: 131 mg/dL — ABNORMAL HIGH (ref 70–99)

## 2024-09-20 LAB — PHOSPHORUS: Phosphorus: 6.9 mg/dL — ABNORMAL HIGH (ref 2.5–4.6)

## 2024-09-20 SURGERY — AMPUTATION BELOW KNEE
Anesthesia: Monitor Anesthesia Care | Site: Knee | Laterality: Left

## 2024-09-20 MED ORDER — ANTICOAGULANT SODIUM CITRATE 4% (200MG/5ML) IV SOLN: 5.0000 mL | Status: DC | PRN

## 2024-09-20 MED ORDER — LIDOCAINE-PRILOCAINE 2.5-2.5 % EX CREA
1.0000 | TOPICAL_CREAM | CUTANEOUS | Status: DC | PRN
Start: 2024-09-20 — End: 2024-09-20

## 2024-09-20 MED ORDER — LIDOCAINE HCL (PF) 1 % IJ SOLN: 5.0000 mL | INTRAMUSCULAR | Status: DC | PRN

## 2024-09-20 MED ORDER — OXYCODONE HCL 5 MG PO TABS: 5.0000 mg | ORAL_TABLET | Freq: Once | ORAL | Status: DC | PRN

## 2024-09-20 MED ORDER — LANTHANUM CARBONATE 500 MG PO CHEW
2000.0000 mg | CHEWABLE_TABLET | Freq: Three times a day (TID) | ORAL | Status: DC
  Filled 2024-09-20: qty 4

## 2024-09-20 MED ORDER — SODIUM CHLORIDE 0.9 % IV SOLN: INTRAVENOUS | Status: DC | PRN

## 2024-09-20 MED ORDER — FENTANYL CITRATE (PF) 250 MCG/5ML IJ SOLN
INTRAMUSCULAR | Status: DC | PRN
  Administered 2024-09-20: 50 ug via INTRAVENOUS

## 2024-09-20 MED ORDER — 0.9 % SODIUM CHLORIDE (POUR BTL) OPTIME
TOPICAL | Status: DC | PRN
  Administered 2024-09-20: 1000 mL

## 2024-09-20 MED ORDER — PHENYLEPHRINE 80 MCG/ML (10ML) SYRINGE FOR IV PUSH (FOR BLOOD PRESSURE SUPPORT)
PREFILLED_SYRINGE | INTRAVENOUS | Status: AC
  Filled 2024-09-20: qty 10

## 2024-09-20 MED ORDER — ASPIRIN 81 MG PO TBEC
81.0000 mg | DELAYED_RELEASE_TABLET | Freq: Every day | ORAL | Status: DC
  Administered 2024-09-21 – 2024-09-29 (×6): 81 mg via ORAL
  Filled 2024-09-20 (×7): qty 1

## 2024-09-20 MED ORDER — DIPHENHYDRAMINE HCL 25 MG PO CAPS
25.0000 mg | ORAL_CAPSULE | Freq: Four times a day (QID) | ORAL | Status: AC | PRN
  Administered 2024-09-20: 25 mg via ORAL

## 2024-09-20 MED ORDER — MIDAZOLAM HCL 2 MG/2ML IJ SOLN
INTRAMUSCULAR | Status: AC
  Filled 2024-09-20: qty 2

## 2024-09-20 MED ORDER — ORAL CARE MOUTH RINSE: 15.0000 mL | Freq: Once | OROMUCOSAL | Status: DC

## 2024-09-20 MED ORDER — ROPIVACAINE HCL 5 MG/ML IJ SOLN
INTRAMUSCULAR | Status: DC | PRN
  Administered 2024-09-20: 20 mL via PERINEURAL
  Administered 2024-09-20: 25 mL via PERINEURAL

## 2024-09-20 MED ORDER — LANTHANUM CARBONATE 500 MG PO CHEW: 1000.0000 mg | CHEWABLE_TABLET | ORAL | Status: DC | PRN

## 2024-09-20 MED ORDER — ALBUTEROL SULFATE (2.5 MG/3ML) 0.083% IN NEBU: 2.5000 mg | INHALATION_SOLUTION | Freq: Four times a day (QID) | RESPIRATORY_TRACT | Status: DC | PRN

## 2024-09-20 MED ORDER — ACETAMINOPHEN 500 MG PO TABS
1000.0000 mg | ORAL_TABLET | Freq: Once | ORAL | Status: DC
  Filled 2024-09-20: qty 2

## 2024-09-20 MED ORDER — PHENYLEPHRINE 80 MCG/ML (10ML) SYRINGE FOR IV PUSH (FOR BLOOD PRESSURE SUPPORT)
PREFILLED_SYRINGE | INTRAVENOUS | Status: DC | PRN
  Administered 2024-09-20: 80 ug via INTRAVENOUS

## 2024-09-20 MED ORDER — MIDAZOLAM HCL (PF) 2 MG/2ML IJ SOLN
INTRAMUSCULAR | Status: DC | PRN
  Administered 2024-09-20: 2 mg via INTRAVENOUS

## 2024-09-20 MED ORDER — LANTHANUM CARBONATE 1000 MG PO PACK: 1000.0000 mg | PACK | ORAL | Status: DC

## 2024-09-20 MED ORDER — FENTANYL CITRATE (PF) 100 MCG/2ML IJ SOLN: 25.0000 ug | INTRAMUSCULAR | Status: DC | PRN

## 2024-09-20 MED ORDER — LACTATED RINGERS IV SOLN: INTRAVENOUS | Status: DC

## 2024-09-20 MED ORDER — RENA-VITE PO TABS
1.0000 | ORAL_TABLET | Freq: Every morning | ORAL | Status: DC
  Administered 2024-09-21 – 2024-09-22 (×2): 1 via ORAL
  Filled 2024-09-20 (×2): qty 1

## 2024-09-20 MED ORDER — PHENYLEPHRINE HCL-NACL 20-0.9 MG/250ML-% IV SOLN
INTRAVENOUS | Status: DC | PRN
  Administered 2024-09-20: 40 ug/min via INTRAVENOUS

## 2024-09-20 MED ORDER — ACETAMINOPHEN 10 MG/ML IV SOLN
INTRAVENOUS | Status: AC
  Filled 2024-09-20: qty 100

## 2024-09-20 MED ORDER — OXYCODONE HCL 5 MG/5ML PO SOLN: 5.0000 mg | Freq: Once | ORAL | Status: DC | PRN

## 2024-09-20 MED ORDER — PROPOFOL 500 MG/50ML IV EMUL
INTRAVENOUS | Status: DC | PRN
  Administered 2024-09-20: 100 ug/kg/min via INTRAVENOUS

## 2024-09-20 MED ORDER — FENTANYL CITRATE (PF) 50 MCG/ML IJ SOSY
PREFILLED_SYRINGE | INTRAMUSCULAR | Status: AC
  Filled 2024-09-20: qty 1

## 2024-09-20 MED ORDER — PENTAFLUOROPROP-TETRAFLUOROETH EX AERO: 1.0000 | INHALATION_SPRAY | CUTANEOUS | Status: DC | PRN

## 2024-09-20 MED ORDER — ALTEPLASE 2 MG IJ SOLR: 2.0000 mg | Freq: Once | INTRAMUSCULAR | Status: DC | PRN

## 2024-09-20 MED ORDER — PROPOFOL 10 MG/ML IV BOLUS
INTRAVENOUS | Status: DC | PRN
  Administered 2024-09-20: 25 mg via INTRAVENOUS

## 2024-09-20 MED ORDER — DIPHENHYDRAMINE HCL 25 MG PO CAPS
ORAL_CAPSULE | ORAL | Status: AC
  Filled 2024-09-20: qty 1

## 2024-09-20 MED ORDER — ACETAMINOPHEN 10 MG/ML IV SOLN
INTRAVENOUS | Status: DC | PRN
  Administered 2024-09-20: 1000 mg via INTRAVENOUS

## 2024-09-20 MED ORDER — FENTANYL CITRATE (PF) 250 MCG/5ML IJ SOLN
INTRAMUSCULAR | Status: AC
  Filled 2024-09-20: qty 5

## 2024-09-20 MED ORDER — CHLORHEXIDINE GLUCONATE 0.12 % MT SOLN
15.0000 mL | Freq: Once | OROMUCOSAL | Status: DC
  Filled 2024-09-20: qty 15

## 2024-09-20 SURGICAL SUPPLY — 42 items
BAG COUNTER SPONGE SURGICOUNT (BAG) ×1 IMPLANT
BANDAGE ESMARK 6X9 LF (GAUZE/BANDAGES/DRESSINGS) IMPLANT
BLADE SAW SGTL 73X25 THK (BLADE) ×1 IMPLANT
BNDG COHESIVE 6X5 TAN ST LF (GAUZE/BANDAGES/DRESSINGS) ×1 IMPLANT
BNDG ELASTIC 4X5.8 VLCR NS LF (GAUZE/BANDAGES/DRESSINGS) IMPLANT
BNDG ELASTIC 4X5.8 VLCR STR LF (GAUZE/BANDAGES/DRESSINGS) ×1 IMPLANT
BNDG ELASTIC 6INX 5YD STR LF (GAUZE/BANDAGES/DRESSINGS) ×1 IMPLANT
BNDG ELASTIC 6X10 VLCR STRL LF (GAUZE/BANDAGES/DRESSINGS) IMPLANT
BNDG GAUZE DERMACEA FLUFF 4 (GAUZE/BANDAGES/DRESSINGS) ×2 IMPLANT
BUR DISC 0.8X25 (BURR) IMPLANT
CANISTER SUCTION 3000ML PPV (SUCTIONS) ×1 IMPLANT
CLIP TI MEDIUM 6 (CLIP) IMPLANT
COVER BACK TABLE 60X90IN (DRAPES) IMPLANT
COVER SURGICAL LIGHT HANDLE (MISCELLANEOUS) ×1 IMPLANT
DRAIN CHANNEL 19F RND (DRAIN) IMPLANT
DRAPE HALF SHEET 40X57 (DRAPES) ×1 IMPLANT
DRAPE SURG ORHT 6 SPLT 77X108 (DRAPES) ×2 IMPLANT
DRSG ADAPTIC 3X8 NADH LF (GAUZE/BANDAGES/DRESSINGS) ×1 IMPLANT
ELECTRODE REM PT RTRN 9FT ADLT (ELECTROSURGICAL) ×1 IMPLANT
EVACUATOR SILICONE 100CC (DRAIN) IMPLANT
GAUZE SPONGE 4X4 12PLY STRL (GAUZE/BANDAGES/DRESSINGS) ×2 IMPLANT
GLOVE BIO SURGEON STRL SZ7.5 (GLOVE) ×1 IMPLANT
GLOVE BIOGEL PI IND STRL 8 (GLOVE) ×1 IMPLANT
GOWN STRL REUS W/ TWL LRG LVL3 (GOWN DISPOSABLE) ×2 IMPLANT
GOWN STRL REUS W/ TWL XL LVL3 (GOWN DISPOSABLE) ×2 IMPLANT
KIT BASIN OR (CUSTOM PROCEDURE TRAY) ×1 IMPLANT
KIT TURNOVER KIT B (KITS) ×1 IMPLANT
PACK GENERAL/GYN (CUSTOM PROCEDURE TRAY) ×1 IMPLANT
PAD ARMBOARD POSITIONER FOAM (MISCELLANEOUS) ×2 IMPLANT
RASP HELIOCORDIAL MED (MISCELLANEOUS) IMPLANT
SOLN 0.9% NACL POUR BTL 1000ML (IV SOLUTION) ×1 IMPLANT
SOLN STERILE WATER BTL 1000 ML (IV SOLUTION) ×1 IMPLANT
STAPLER SKIN PROX 35W (STAPLE) ×1 IMPLANT
STOCKINETTE IMPERVIOUS LG (DRAPES) ×1 IMPLANT
SUT ETHILON 3 0 PS 1 (SUTURE) IMPLANT
SUT SILK 2 0 SH CR/8 (SUTURE) ×1 IMPLANT
SUT SILK 2-0 18XBRD TIE 12 (SUTURE) ×1 IMPLANT
SUT SILK 3-0 18XBRD TIE 12 (SUTURE) ×1 IMPLANT
SUT VIC AB 2-0 CT1 18 (SUTURE) ×2 IMPLANT
SUT VIC AB 3-0 SH 18 (SUTURE) IMPLANT
TOWEL GREEN STERILE (TOWEL DISPOSABLE) ×2 IMPLANT
UNDERPAD 30X36 HEAVY ABSORB (UNDERPADS AND DIAPERS) ×1 IMPLANT

## 2024-09-20 NOTE — Progress Notes (Signed)
 Patient left unit to preop. Tele box left in room, on standby. Report given to CRNA John Dye. Insulin  Pump with patient. Consent signed by patient prior to leaving unit.

## 2024-09-20 NOTE — Progress Notes (Signed)
 Orthopedic Tech Progress Note Patient Details:  Karen [name] Apr 05, 1976 [id number]  Called in order to [facility] for a BKA AMPUSHIELD WITH SHRINKER   Patient ID: Orlene Hermann, female   DOB: 19-Dec-1975, 48 y.o.   MRN: [id number]  Delanna LITTIE Pac 09/20/2024, 10:24 AM

## 2024-09-20 NOTE — Care Management Important Message (Signed)
 Important Message  Patient Details  Name: Karen [name] MRN: [id number] Date of Birth: [birth year]   Important Message Given:  Yes - Medicare IM     Claretta Deed 09/20/2024, 3:33 PM

## 2024-09-20 NOTE — Progress Notes (Signed)
 PROGRESS NOTE   Karen [name]  FMW:[id number]    DOB: Dec 11, 1975    DOA: 09/16/2024  PCP: Lesa Jon HERO, PA   I have briefly reviewed patients previous medical records in [facility].   Brief Hospital Course:   48 y.o. female with history of ESRD on hemodialysis has had recent peritoneal catheter placement with history of CAD status post PCI in February 2025 who was recently admitted to the hospital for diabetic foot infection underwent left foot transmetatarsal amputation and right foot third toe amputation on September 10, 2024 and also underwent angiogram with [facility] angioplasty of the left popliteal artery was discharged home on September 13, 2024 has been experiencing weakness fever chills last 2-3 days.  In ER patient was noted to have a fever, tachycardia, leukocytosis and discolored transmetatarsal flap.  Patient was started on empiric antibiotics.  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 PAD S/p DCB angioplasty of left popliteal artery last week As per podiatry input 10/17, significant necrosis of left foot plantar flap from recent surgery.  They recommended vascular surgery consultation for possible higher amputation On careful review of chart since admission, did not meet criteria for sepsis.  Despite prior documentations, sepsis ruled out. 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 Continue empirically started IV vancomycin  and cefepime.  Postop antibiotics, choice and duration deferred to vascular surgery. Remains on aspirin  and Plavix .  Intolerant to statins.  Nausea and vomiting Resolved  Type II DM/IDDM on insulin  pump, controlled Diabetic peripheral neuropathy A1c 6.5 on 10/6. Patient  is back on her insulin  pump with good inpatient control.  Discontinued subcutaneous insulins. Continue current dose of gabapentin .   ESRD on MWF HD Hyperkalemia On HD per nephrology, appears to be at HD now. Potassium 5.9 noted this morning but patient was already in the OR.  Discussed with Dr. Tobie, recommended HD post surgery.  Right hip pain Patient feels that she twisted her hip at home.  X-rays right hip and lumbar spine without acute findings. Continue multimodality pain control including gabapentin , lidocaine  patch.  Requested PT evaluation-input pending.  Better.  Essential hypertension Controlled on metoprolol  to tartrate 12.5 mg twice daily on nondialysis days.  Anemia of ESRD Stable.  Follow CBC in AM.  S/p right third toe amputation Healing well as per podiatry follow-up 10/17.  They changed dressing that day and do not recommend change of dressing for another week or so and if it is changed would recommend Xeroform 4 x 4 gauze Kerlix Ace roll.  Sutures will come out in 1 to 2 weeks.  WBAT on right foot in postop shoe.  Outpatient follow-up with Dr. Malvin.  Body mass index is 33.13 kg/m./Class I obesity Complicates care.   DVT prophylaxis: SCD's Start: 09/20/24 1016  Start subcutaneous heparin  postop pending vascular surgery clearance.   Code Status: Full Code:  Family Communication: Daughter and a friend at bedside with patient's consent. Disposition:  Status is: Inpatient Remains inpatient appropriate because: Pending PT and OT evaluation and vascular surgery clearance.     Consultants:   Podiatry Vascular surgery Nephrology  Procedures:   As above  Subjective:  Seen patient postop and her hospital room.  Right hip pain slightly better.  No  other complaints reported.  Objective:   Vitals:   09/20/24 1330 09/20/24 1400 09/20/24 1430 09/20/24 1457  BP: (!) 96/54 107/61 125/68 97/60  Pulse: 72 71 76 72  Resp: (!) 7 10 15    Temp:      TempSrc:       SpO2: 91% 96% 98% 92%  Weight:      Height:        General exam: Young female, moderately built and obese lying comfortably propped up in bed without distress. Respiratory system: Clear to auscultation.  No increased work of breathing. Cardiovascular system: S1 & S2 heard, RRR. No JVD, murmurs, rubs, gallops or clicks. No pedal edema.   Gastrointestinal system: Abdomen is nondistended, soft and nontender. No organomegaly or masses felt. Normal bowel sounds heard. Central nervous system: Alert and oriented. No focal neurological deficits. Extremities: Symmetric 5 x 5 power.  Left BKA postop dressing clean and dry.  Right foot postop dressing clean and dry. Skin: No rashes, lesions or ulcers Psychiatry: Judgement and insight appear normal. Mood & affect appropriate.     Data Reviewed:   I have personally reviewed following labs and imaging studies   CBC: Recent Labs  Lab 09/16/24 1747 09/17/24 0457 09/18/24 0327 09/20/24 0523  WBC 19.4* 16.4* 12.1* 10.1  NEUTROABS 16.0*  --   --   --   HGB 8.4* 8.7* 8.9* 8.0*  HCT 26.8* 27.5* 29.6* 26.0*  MCV 95.0 94.5 96.7 96.7  PLT 511* 495* 572* 422*    Basic Metabolic Panel: Recent Labs  Lab 09/16/24 1747 09/17/24 0457 09/18/24 0327 09/20/24 0523  NA 136 134* 137 134*  K 4.2 4.3 4.4 5.9*  CL 93* 95* 94* 95*  CO2 24 21* 27 25  GLUCOSE 134* 235* 163* 138*  BUN 40* 46* 33* 69*  CREATININE 6.70* 7.48* 5.42* 9.54*  CALCIUM  8.3* 8.6* 8.3* 8.4*  PHOS  --   --   --  6.9*    Liver Function Tests: Recent Labs  Lab 09/16/24 1747 09/17/24 0457 09/18/24 0327  AST 20 16 15   ALT 17 15 14   ALKPHOS 293* 255* 246*  BILITOT 0.8 0.9 0.6  PROT 7.5 7.3 7.2  ALBUMIN  2.6* 2.4* 2.3*    CBG: Recent Labs  Lab 09/20/24 0418 09/20/24 0646 09/20/24 0857  GLUCAP 125* 130* 131*    Microbiology Studies:   Recent Results (from the past 240 hours)  Culture, blood (Routine x 2)     Status: None (Preliminary result)   Collection Time:  09/16/24  5:10 PM   Specimen: BLOOD RIGHT ARM  Result Value Ref Range Status   Specimen Description BLOOD RIGHT ARM  Final   Special Requests   Final    BOTTLES DRAWN AEROBIC AND ANAEROBIC Blood Culture results may not be optimal due to an inadequate volume of blood received in culture bottles   Culture   Final    NO GROWTH 4 DAYS Performed at [facility], [address] [place], [place] [address]    Report Status PENDING  Incomplete  Culture, blood (Routine x 2)     Status: None (Preliminary result)   Collection Time: 09/16/24  5:42 PM   Specimen: BLOOD RIGHT ARM  Result Value Ref Range Status   Specimen Description BLOOD RIGHT ARM  Final   Special Requests   Final    BOTTLES DRAWN AEROBIC AND ANAEROBIC Blood Culture results may not be optimal due to an inadequate volume of blood received in culture  bottles   Culture   Final    NO GROWTH 4 DAYS Performed at [facility], [address] [place], [place] [address]    Report Status PENDING  Incomplete  Body fluid culture w Gram Stain     Status: None   Collection Time: 09/16/24  9:07 PM   Specimen: Peritoneal Washings; Body Fluid  Result Value Ref Range Status   Specimen Description PERITONEAL FLUID  Final   Special Requests Immunocompromised  Final   Gram Stain NO ORGANISMS SEEN NO WBC SEEN CYTOSPIN SMEAR   Final   Culture   Final    NO GROWTH 3 DAYS Performed at [facility], [address] [place], [place] [address]    Report Status 09/20/2024 FINAL  Final  MRSA Next Gen by PCR, Nasal     Status: None   Collection Time: 09/17/24  4:48 AM   Specimen: Nasal Mucosa; Nasal Swab  Result Value Ref Range Status   MRSA by PCR Next Gen NOT DETECTED NOT DETECTED Final    Comment: (NOTE) The GeneXpert MRSA Assay (FDA approved for NASAL specimens only), is one component of a comprehensive MRSA colonization surveillance program. It is not intended to diagnose MRSA infection nor to guide or monitor treatment  for MRSA infections. Test performance is not FDA approved in patients less than 65 years old. Performed at [facility], [address] [place], [place] [address]     Radiology Studies:  No results found.   Scheduled Meds:    (feeding supplement) PROSource Plus  30 mL Oral BID BM   [START ON 09/21/2024] aspirin  EC  81 mg Oral Daily   brimonidine  1 drop Both Eyes TID   Chlorhexidine  Gluconate Cloth  6 each Topical Q0600   Chlorhexidine  Gluconate Cloth  6 each Topical Q0600   cinacalcet   90 mg Oral Q breakfast   gabapentin   100 mg Oral QHS   gabapentin   200 mg Oral Daily   insulin  pump   Subcutaneous TID WC, HS, 0200   Lanthanum  Carbonate  1,000-2,000 mg Oral See admin instructions   metoprolol  tartrate  12.5 mg Oral 2 times per day on Sunday Tuesday Thursday Saturday   [START ON 09/21/2024] multivitamin  1 tablet Oral q AM    Continuous Infusions:    anticoagulant sodium citrate      ceFEPime (MAXIPIME) IV 1 g (09/19/24 2159)   vancomycin        LOS: 4 days     Trenda Mar, MD,  FACP, [facility], [facility], [facility]   [facility] Advisor [place]      To contact the attending provider between 7A-7P or the covering provider during after hours 7P-7A, please log into the web site [email or web address] and access using universal [place] password for that web site. If you do not have the password, please call the hospital operator.  09/20/2024, 3:13 PM

## 2024-09-20 NOTE — Anesthesia Postprocedure Evaluation (Signed)
 Anesthesia Post Note  Patient: Health and safety inspector  Procedure(s) Performed: LEFT AMPUTATION BELOW KNEE (Left: Knee)     Patient location during evaluation: PACU Anesthesia Type: Regional and MAC Level of consciousness: awake Pain management: pain level controlled Vital Signs Assessment: post-procedure vital signs reviewed and stable Respiratory status: spontaneous breathing, nonlabored ventilation and respiratory function stable Cardiovascular status: stable and blood pressure returned to baseline Postop Assessment: no apparent nausea or vomiting Anesthetic complications: no   No notable events documented.  Last Vitals:  Vitals:   09/20/24 0915 09/20/24 0958  BP: (!) 93/56 (!) 122/56  Pulse: 65 70  Resp: 12 19  Temp: 36.6 C 36.6 C  SpO2: 94% 96%    Last Pain:  Vitals:   09/20/24 1123  TempSrc:   PainSc: 8                  Delon Aisha Arch

## 2024-09-20 NOTE — Plan of Care (Signed)
  Problem: Clinical Measurements: Goal: Will remain free from infection Outcome: Progressing Goal: Respiratory complications will improve Outcome: Progressing   Problem: Nutrition: Goal: Adequate nutrition will be maintained Outcome: Progressing   

## 2024-09-20 NOTE — Progress Notes (Signed)
 Patient ID: Karen [name], female   DOB: 18-Nov-1976, 48 y.o.   MRN: [id number] [facility] Progress Note   Assessment/ Plan:   1.  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  Seen postoperatively and is clinically stable/doing well. 2. ESRD: Typically on a Monday/Wednesday/Friday dialysis schedule and will undergo hemodialysis today.  Hyperkalemia seen on labs that will be managed definitively with hemodialysis. 3. Anemia: Low hemoglobin/hematocrit noted this morning and anticipate to decline further status postsurgery with overt losses.  Will continue to follow trend and decide on need for ESA redosing +/- PRBC transfusion. 4. CKD-MBD: Calcium  level at goal when corrected for albumin , will add on phosphorus levels to labs from this morning.  No binders seen on MAR and currently getting Cinacalcet  for PTH control. 5. Nutrition: Continue renal diet with oral nutritional supplementation to hasten wound healing. 6. Hypertension: Blood pressure currently at goal, monitor with hemodialysis/ultrafiltration.  Subjective:   Reports to be feeling fair and is currently comfortable status post left below-knee amputation   Objective:   BP (!) 122/56 (BP Location: Right Arm)   Pulse 70   Temp 97.9 F (36.6 C)   Resp 19   Ht 5' 5 (1.651 m)   Wt 90.3 kg   SpO2 96%   BMI 33.13 kg/m   Physical Exam: Gen: Comfortably resting in bed, daughter/grandchild at bedside. CVS: Pulse regular rhythm, normal rate, S1 and S2 normal Resp: Clear to auscultation bilaterally, no distinct rales or rhonchi Abd: Soft, flat, nontender, bowel sounds normal Ext: Left leg status post below-knee amputation with intact dressing.  Right foot/ankle in Ace wrap.  Labs: BMET Recent Labs  Lab 09/16/24 1747 09/17/24 0457 09/18/24 0327 09/20/24 0523  NA 136 134* 137  134*  K 4.2 4.3 4.4 5.9*  CL 93* 95* 94* 95*  CO2 24 21* 27 25  GLUCOSE 134* 235* 163* 138*  BUN 40* 46* 33* 69*  CREATININE 6.70* 7.48* 5.42* 9.54*  CALCIUM  8.3* 8.6* 8.3* 8.4*   CBC Recent Labs  Lab 09/16/24 1747 09/17/24 0457 09/18/24 0327 09/20/24 0523  WBC 19.4* 16.4* 12.1* 10.1  NEUTROABS 16.0*  --   --   --   HGB 8.4* 8.7* 8.9* 8.0*  HCT 26.8* 27.5* 29.6* 26.0*  MCV 95.0 94.5 96.7 96.7  PLT 511* 495* 572* 422*    Medications:     (feeding supplement) PROSource Plus  30 mL Oral BID BM   [START ON 09/21/2024] aspirin  EC  81 mg Oral Daily   brimonidine  1 drop Both Eyes TID   Chlorhexidine  Gluconate Cloth  6 each Topical Q0600   Chlorhexidine  Gluconate Cloth  6 each Topical Q0600   cinacalcet   90 mg Oral Q breakfast   gabapentin   100 mg Oral QHS   gabapentin   200 mg Oral Daily   insulin  aspart  0-6 Units Subcutaneous Q4H   insulin  aspart  5 Units Subcutaneous TID WC   insulin  pump   Subcutaneous TID WC, HS, 0200   metoprolol  tartrate  12.5 mg Oral 2 times per day on Sunday Tuesday Thursday Saturday   Gordy Blanch, MD 09/20/2024, 10:55 AM

## 2024-09-20 NOTE — Progress Notes (Addendum)
  Received patient in bed to unit.   Informed consent signed and in chart.    TX duration:3 Came off 18 min early AMA signed     Transported by  Hand-off given to patient's nurse.    Access used: left AVF Access issues: None   Total UF removed: 19700 Medication(s) given: Benadryl  po 25 mg Fentanyl  50 MCG  Post HD VS: 125/61  Hunter Hacking LPN Kidney Dialysis Unit

## 2024-09-20 NOTE — Progress Notes (Signed)
 PT Cancellation Note  Patient Details Name: Karen [name] MRN: [id number] DOB: 07-05-1976   Cancelled Treatment:    Reason Eval/Treat Not Completed: Patient at procedure or test/unavailable  OR for LBKA this morning;   Will follow up later today as time allows;  Otherwise, will follow up for PT tomorrow;   Thank you,  Silvano Currier, PT  Acute Rehabilitation Services Office [phone]    Silvano VEAR Currier 09/20/2024, 9:22 AM

## 2024-09-20 NOTE — Progress Notes (Signed)
 OT Cancellation Note  Patient Details Name: Karen [name] MRN: [id number] DOB: 1976/05/27   Cancelled Treatment:    Reason Eval/Treat Not Completed: Patient at procedure or test/ unavailable. Pt down for surgery, will follow up as pt medically ready and OT schedule allows.   Elma JONETTA Lebron FREDERICK, OTR/L [facility] Acute Rehabilitation Office: [phone]   Elma JONETTA Lebron 09/20/2024, 7:27 AM

## 2024-09-20 NOTE — Progress Notes (Signed)
 [facility]  Subjective  - no complaints   Objective (!) 175/70 84 98.2 F (36.8 C) (Oral) 18 95%  Intake/Output Summary (Last 24 hours) at 09/20/2024 0735 Last data filed at 09/20/2024 0540 Gross per 24 hour  Intake --  Output 0 ml  Net 0 ml    Left TMA non-healing  Laboratory Lab Results: Recent Labs    09/18/24 0327 09/20/24 0523  WBC 12.1* 10.1  HGB 8.9* 8.0*  HCT 29.6* 26.0*  PLT 572* 422*   BMET Recent Labs    09/18/24 0327 09/20/24 0523  NA 137 134*  K 4.4 5.9*  CL 94* 95*  CO2 27 25  GLUCOSE 163* 138*  BUN 33* 69*  CREATININE 5.42* 9.54*  CALCIUM  8.3* 8.4*    COAG Lab Results  Component Value Date   INR 1.1 09/16/2024   INR 1.1 09/06/2024   No results found for: PTT  Assessment/Planning:  Plan conversion of left TMA to left BKA.  Risks and benefits.    Lonni JINNY Gaskins 09/20/2024 7:35 AM --

## 2024-09-20 NOTE — Transfer of Care (Signed)
 Immediate Anesthesia Transfer of Care Note  Patient: [name]  Procedure(s) Performed: LEFT AMPUTATION BELOW KNEE (Left: Knee)  Patient Location: PACU  Anesthesia Type:MAC combined with regional for post-op pain  Level of Consciousness: sedated  Airway & Oxygen Therapy: Patient Spontanous Breathing and Patient connected to face mask oxygen  Post-op Assessment: Report given to RN and Post -op Vital signs reviewed and stable  Post vital signs: Reviewed and stable  Last Vitals:  Vitals Value Taken Time  BP 89/51 09/20/24 08:55  Temp 98   Pulse 63 09/20/24 08:57  Resp 8 09/20/24 08:57  SpO2 100 % 09/20/24 08:57  Vitals shown include unfiled device data.  Last Pain:  Vitals:   09/20/24 0726  TempSrc:   PainSc: Asleep      Patients Stated Pain Goal: 3 (09/19/24 0445)  Complications: No notable events documented.

## 2024-09-20 NOTE — Inpatient Diabetes Management (Signed)
 Inpatient Diabetes Program Recommendations  AACE/ADA: New Consensus Statement on Inpatient Glycemic Control  Target Ranges:  Prepandial:   less than 140 mg/dL      Peak postprandial:   less than 180 mg/dL (1-2 hours)      Critically ill patients:  140 - 180 mg/dL    Latest Reference Range & Units 09/19/24 08:36 09/19/24 12:02 09/19/24 17:05 09/19/24 20:15 09/19/24 23:53 09/20/24 04:18 09/20/24 06:46 09/20/24 08:57  Glucose-Capillary 70 - 99 mg/dL 823 (H) 707 (H) 739 (H) 185 (H) 140 (H) 125 (H) 130 (H) 131 (H)   Review of Glycemic Control  Diabetes history: DM2 Outpatient Diabetes medications: T-Slim insulin  pump Current orders for Inpatient glycemic control: Insulin  Pump AC&HS and 2am, Novolog  0-6 units Q4H, Novolog  5 units TID with meals  Inpatient Diabetes Program Recommendations:    Insulin : Roberta, RN reports that patient has insulin  pump on and using for glycemic control. Please discontinue Novolog  0-6 units Q4H and Novolog  5 units TID with meals since patient is able to bolus for correction and carb coverage via using insulin  pump.   Thanks, Earnie Gainer, RN, MSN, CDCES Diabetes Coordinator Inpatient Diabetes Program [phone] (Team Pager from 8am to 5pm)

## 2024-09-20 NOTE — Anesthesia Procedure Notes (Signed)
 Anesthesia Regional Block: Adductor canal block   Pre-Anesthetic Checklist: , timeout performed,  Correct Patient, Correct Site, Correct Laterality,  Correct Procedure, Correct Position, site marked,  Risks and benefits discussed,  Surgical consent,  Pre-op evaluation,  At surgeon's request and post-op pain management  Laterality: Left  Prep: chloraprep       Needles:  Injection technique: Single-shot  Needle Type: Echogenic Stimulator Needle     Needle Length: 9cm  Needle Gauge: 21     Additional Needles:   Procedures:,,,, ultrasound used (permanent image in chart),,    Narrative:  Start time: 09/20/2024 7:11 AM End time: 09/20/2024 7:14 AM Injection made incrementally with aspirations every 5 mL.  Performed by: Personally  Anesthesiologist: Peggye Delon Brunswick, MD  Additional Notes: Discussed risks and benefits of nerve block including, but not limited to, prolonged and/or permanent nerve injury involving sensory and/or motor function. Monitors were applied and a time-out was performed. The nerve and associated structures were visualized under ultrasound guidance. After negative aspiration, local anesthetic was slowly injected around the nerve. There was no evidence of high pressure during the procedure. There were no paresthesias. VSS remained stable and the patient tolerated the procedure well.

## 2024-09-20 NOTE — Plan of Care (Signed)
  Problem: Health Behavior/Discharge Planning: Goal: Ability to manage health-related needs will improve Outcome: Progressing   Problem: Clinical Measurements: Goal: Ability to maintain clinical measurements within normal limits will improve Outcome: Progressing Goal: Will remain free from infection Outcome: Progressing Goal: Diagnostic test results will improve Outcome: Progressing Goal: Respiratory complications will improve Outcome: Progressing Goal: Cardiovascular complication will be avoided Outcome: Progressing   Problem: Activity: Goal: Risk for activity intolerance will decrease Outcome: Progressing   Problem: Elimination: Goal: Will not experience complications related to bowel motility Outcome: Progressing Goal: Will not experience complications related to urinary retention Outcome: Progressing   Problem: Pain Managment: Goal: General experience of comfort will improve and/or be controlled Outcome: Progressing   Problem: Education: Goal: Ability to describe self-care measures that may prevent or decrease complications (Diabetes Survival Skills Education) will improve Outcome: Progressing Goal: Individualized Educational Video(s) Outcome: Progressing   Problem: Education: Goal: Individualized Educational Video(s) Outcome: Progressing   Problem: Health Behavior/Discharge Planning: Goal: Ability to identify and utilize available resources and services will improve Outcome: Progressing Goal: Ability to manage health-related needs will improve Outcome: Progressing

## 2024-09-20 NOTE — Anesthesia Procedure Notes (Signed)
 Anesthesia Regional Block: Popliteal block   Pre-Anesthetic Checklist: , timeout performed,  Correct Patient, Correct Site, Correct Laterality,  Correct Procedure, Correct Position, site marked,  Risks and benefits discussed,  Surgical consent,  Pre-op evaluation,  At surgeon's request and post-op pain management  Laterality: Left  Prep: chloraprep       Needles:  Injection technique: Single-shot  Needle Type: Echogenic Stimulator Needle     Needle Length: 9cm  Needle Gauge: 21     Additional Needles:   Procedures:,,,, ultrasound used (permanent image in chart),,    Narrative:  Start time: 09/20/2024 7:05 AM End time: 09/20/2024 7:10 AM Injection made incrementally with aspirations every 5 mL.  Performed by: Personally  Anesthesiologist: Peggye Delon Brunswick, MD  Additional Notes: Discussed risks and benefits of nerve block including, but not limited to, prolonged and/or permanent nerve injury involving sensory and/or motor function. Monitors were applied and a time-out was performed. The nerve and associated structures were visualized under ultrasound guidance. After negative aspiration, local anesthetic was slowly injected around the nerve. There was no evidence of high pressure during the procedure. There were no paresthesias. VSS remained stable and the patient tolerated the procedure well.

## 2024-09-20 NOTE — Progress Notes (Signed)
 Patient c/o itching on her nose and face. Provider called

## 2024-09-20 NOTE — Op Note (Signed)
 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 Lonni DOROTHA Gaskins, MD  Assistant: Ahmed Holster, PA  Indications: 48 year old female with nonhealing left TMA.  She has been evaluated by my partner who has recommended a left below-knee amputation.  She presents after risk-benefits discussed.  On Assistant was needed given the complexity of the case and also to perform the amputation and reapproximate the fascia.  Findings: Left below knee amputation with viable but marginal appearing muscle that was primarily closed with staples in the skin.  Anesthesia: Regional  Details: Patient was taken to the operating room after informed consent was obtained.  Placed on operative table supine position.  After anesthesia was induced the left leg was prepped and draped in standard sterile fashion.  Antibiotics were given and timeout performed.  I marked out the anterior flap approximately one handsbreadth below the tibial tuberosity on the left calf.  I used a suture and marked out the circumference of the calf here.  This was cut to two thirds and one third length.  The two thirds length was used to mark the anterior posterior diameter of the flap and the one third diameter was used to mark the length of the flap.  Incision was made and we carried dissection down with Bovie cautery through the muscle and fascia.  Ultimately I divided the tibia with an oscillating saw.  The fibula was cut with a bone cutter.  All tibial vessels including the anterior tibia were identified and clamped between hemostats.  Completed the posterior flap with Bovie cautery.  All the tibial vessels were oversewn with 2-0 silk suture.  The gastrocnemius soleus muscle did appear marginal but viable.  We then copiously irrigated the wound.  Hemostasis was achieved with Bovie cautery.  The anterior  posterior flap was then put together with 2-0 Vicryl in interrupted fashion reapproximating the fascia with the help of my assistant.  We did this until we could not insert a finger in the fascia.  The skin was closed with staples.  Dry sterile dressings were applied.  Taken to recovery in stable condition.  Complication: None  Condition: Stable  Lonni DOROTHA Gaskins, MD [facility] [place] Office: [phone]   Lonni JINNY Gaskins

## 2024-09-21 ENCOUNTER — Encounter (HOSPITAL_COMMUNITY): Payer: Self-pay | Admitting: Vascular Surgery

## 2024-09-21 DIAGNOSIS — M79672 Pain in left foot: Secondary | ICD-10-CM | POA: Diagnosis not present

## 2024-09-21 DIAGNOSIS — I998 Other disorder of circulatory system: Secondary | ICD-10-CM | POA: Diagnosis not present

## 2024-09-21 LAB — CBC
HCT: 28.7 % — ABNORMAL LOW (ref 36.0–46.0)
Hemoglobin: 8.9 g/dL — ABNORMAL LOW (ref 12.0–15.0)
MCH: 29.6 pg (ref 26.0–34.0)
MCHC: 31 g/dL (ref 30.0–36.0)
MCV: 95.3 fL (ref 80.0–100.0)
Platelets: 499 K/uL — ABNORMAL HIGH (ref 150–400)
RBC: 3.01 MIL/uL — ABNORMAL LOW (ref 3.87–5.11)
RDW: 13.6 % (ref 11.5–15.5)
WBC: 15.3 K/uL — ABNORMAL HIGH (ref 4.0–10.5)
nRBC: 0 % (ref 0.0–0.2)

## 2024-09-21 LAB — RENAL FUNCTION PANEL
Albumin: 2.4 g/dL — ABNORMAL LOW (ref 3.5–5.0)
Anion gap: 15 (ref 5–15)
BUN: 41 mg/dL — ABNORMAL HIGH (ref 6–20)
CO2: 26 mmol/L (ref 22–32)
Calcium: 9.1 mg/dL (ref 8.9–10.3)
Chloride: 94 mmol/L — ABNORMAL LOW (ref 98–111)
Creatinine, Ser: 6.07 mg/dL — ABNORMAL HIGH (ref 0.44–1.00)
GFR, Estimated: 8 mL/min — ABNORMAL LOW (ref >60)
Glucose, Bld: 153 mg/dL — ABNORMAL HIGH (ref 70–99)
Phosphorus: 5.8 mg/dL — ABNORMAL HIGH (ref 2.5–4.6)
Potassium: 4.8 mmol/L (ref 3.5–5.1)
Sodium: 135 mmol/L (ref 135–145)

## 2024-09-21 LAB — CULTURE, BLOOD (ROUTINE X 2)
Culture: NO GROWTH
Culture: NO GROWTH

## 2024-09-21 LAB — HEPATITIS B SURFACE ANTIBODY, QUANTITATIVE: Hep B S AB Quant (Post): 90.5 m[IU]/mL

## 2024-09-21 LAB — GLUCOSE, CAPILLARY
Glucose-Capillary: 165 mg/dL — ABNORMAL HIGH (ref 70–99)
Glucose-Capillary: 163 mg/dL — ABNORMAL HIGH (ref 70–99)
Glucose-Capillary: 222 mg/dL — ABNORMAL HIGH (ref 70–99)
Glucose-Capillary: 192 mg/dL — ABNORMAL HIGH (ref 70–99)

## 2024-09-21 LAB — SURGICAL PATHOLOGY

## 2024-09-21 MED ORDER — HYDROMORPHONE HCL 1 MG/ML IJ SOLN
0.5000 mg | INTRAMUSCULAR | Status: AC
  Administered 2024-09-21: 0.5 mg via INTRAVENOUS
  Filled 2024-09-21: qty 0.5

## 2024-09-21 MED ORDER — METHOCARBAMOL 500 MG PO TABS
500.0000 mg | ORAL_TABLET | Freq: Four times a day (QID) | ORAL | Status: DC | PRN
  Administered 2024-09-21 – 2024-09-29 (×26): 500 mg via ORAL
  Filled 2024-09-21 (×26): qty 1

## 2024-09-21 MED ORDER — VANCOMYCIN VARIABLE DOSE PER UNSTABLE RENAL FUNCTION (PHARMACIST DOSING): Status: DC

## 2024-09-21 MED ORDER — PANTOPRAZOLE SODIUM 40 MG PO TBEC
40.0000 mg | DELAYED_RELEASE_TABLET | Freq: Every day | ORAL | Status: DC
  Administered 2024-09-21 – 2024-09-29 (×6): 40 mg via ORAL
  Filled 2024-09-21 (×7): qty 1

## 2024-09-21 MED ORDER — LIDOCAINE 5 % EX PTCH
2.0000 | MEDICATED_PATCH | CUTANEOUS | Status: DC
  Administered 2024-09-21: 2 via TRANSDERMAL
  Filled 2024-09-21: qty 2

## 2024-09-21 MED ORDER — CHLORHEXIDINE GLUCONATE CLOTH 2 % EX PADS: 6.0000 | MEDICATED_PAD | Freq: Every day | CUTANEOUS | Status: DC

## 2024-09-21 MED ORDER — HYDROMORPHONE HCL 1 MG/ML IJ SOLN
0.5000 mg | INTRAMUSCULAR | Status: DC | PRN
  Administered 2024-09-21 – 2024-09-29 (×63): 0.5 mg via INTRAVENOUS
  Filled 2024-09-21 (×60): qty 0.5

## 2024-09-21 NOTE — Evaluation (Signed)
 Occupational Therapy Evaluation Patient Details Name: Karen [name] MRN: [id number] DOB: [birth year] Today's Date: 09/21/2024   History of Present Illness   Karen [name] is a 48 y.o. female admitted on 09/16/24 with infected left diabetic foot wound. 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     Clinical Impressions Patient admitted for the procedure above.  PTA she was staying with her sister, because her home has too many stairs.  Plan will be to return to her sister's home initially.  Patient stating she was needing Min A for transfers due to perceived weakness to her RLE.  Deficits listed below, currently needing up to Mod A for lower body ADL seated, and Min A for lateral scoots.  Patient will benefit from intensive inpatient follow-up therapy, >3 hours/day.  She demonstrates the ability to reach a Mod I level at wheelchair prior to returning home.  OT will continue efforts in the acute setting.       If plan is discharge home, recommend the following:   A lot of help with bathing/dressing/bathroom;A lot of help with walking and/or transfers;Assist for transportation;Assistance with cooking/housework     Functional Status Assessment   Patient has had a recent decline in their functional status and demonstrates the ability to make significant improvements in function in a reasonable and predictable amount of time.     Equipment Recommendations   None recommended by OT     Recommendations for Other Services   Rehab consult     Precautions/Restrictions   Precautions Precautions: Fall Recall of Precautions/Restrictions: Intact Restrictions Weight Bearing Restrictions  Per Provider Order: Yes RLE Weight Bearing Per Provider Order: Non weight bearing LLE Weight Bearing Per Provider Order: Non weight bearing Other Position/Activity Restrictions: WBAT R foot with post op shoe, NWB LLE with post op shoe     Mobility Bed Mobility Overal bed mobility: Modified Independent Bed Mobility: Supine to Sit, Sit to Supine     Supine to sit: Supervision, HOB elevated       Patient Response: Cooperative  Transfers Overall transfer level: Needs assistance Equipment used: Rolling walker (2 wheels) Transfers: Bed to chair/wheelchair/BSC            Lateral/Scoot Transfers: Min assist        Balance Overall balance assessment: Needs assistance Sitting-balance support: No upper extremity supported, Feet supported Sitting balance-Leahy Scale: Good                                     ADL either performed or assessed with clinical judgement   ADL Overall ADL's : Needs assistance/impaired Eating/Feeding: Independent   Grooming: Wash/dry hands;Wash/dry face;Oral care;Contact guard assist;Sitting   Upper Body Bathing: Contact guard assist;Sitting   Lower Body Bathing: Moderate assistance;Sitting/lateral leans   Upper Body Dressing : Sitting;Minimal assistance   Lower Body Dressing: Moderate assistance;Sitting/lateral leans   Toilet Transfer: Minimal assistance;Transfer board;BSC/3in1                   Vision Patient Visual Report: No change from baseline Vision Assessment?: Wears glasses for reading     Perception Perception: Not tested       Praxis  Praxis: Not tested       Pertinent Vitals/Pain Pain Assessment Pain Assessment: Faces Faces Pain Scale: Hurts even more Pain Location: LLE Pain Descriptors / Indicators: Burning, Constant, Grimacing Pain Intervention(s): Monitored during session     Extremity/Trunk Assessment Upper Extremity Assessment Upper Extremity Assessment: Overall WFL for tasks assessed RUE  Sensation: decreased light touch LUE Sensation: decreased light touch   Lower Extremity Assessment Lower Extremity Assessment: Defer to PT evaluation       Communication Communication Communication: No apparent difficulties   Cognition Arousal: Alert Behavior During Therapy: WFL for tasks assessed/performed Cognition: No apparent impairments                               Following commands: Intact       Cueing  General Comments   Cueing Techniques: Verbal cues;Tactile cues   VSS on RA   Exercises     Shoulder Instructions      Home Living Family/patient expects to be discharged to:: Private residence Living Arrangements: Other relatives (Will be staying with her sister) Available Help at Discharge: Family Type of Home: House Home Access: Stairs to enter Secretary/administrator of Steps: 3 Entrance Stairs-Rails: Right;Left Home Layout: One level     Bathroom Shower/Tub: Chief Strategy Officer: Standard     Home Equipment: Rollator (4 wheels);Wheelchair - manual;Shower seat;Hand held shower head          Prior Functioning/Environment Prior Level of Function : Needs assist       Physical Assist : Mobility (physical);ADLs (physical) Mobility (physical): Transfers;Stairs ADLs (physical): Bathing;Dressing;Toileting;IADLs   ADLs Comments: Pt. was Mod a with bathing. Pt. was Max A with LE dressing.    OT Problem List: Decreased activity tolerance;Impaired balance (sitting and/or standing);Decreased strength;Pain   OT Treatment/Interventions: Self-care/ADL training;DME and/or AE instruction;Therapeutic activities;Patient/family education      OT Goals(Current goals can be found in the care plan section)   Acute Rehab OT Goals Patient Stated Goal: Return to sister's home initally OT Goal Formulation: With patient Time For Goal Achievement: 10/05/24 Potential to Achieve Goals: Good ADL Goals Pt Will Perform Grooming: with  modified independence;sitting Pt Will Perform Lower Body Bathing: with modified independence;sitting/lateral leans Pt Will Perform Lower Body Dressing: with modified independence;sitting/lateral leans Pt Will Transfer to Toilet: with modified independence;stand pivot transfer;regular height toilet   OT Frequency:  Min 2X/week    Co-evaluation              AM-PAC OT 6 Clicks Daily Activity     Outcome Measure Help from another person eating meals?: None Help from another person taking care of personal grooming?: A Little Help from another person toileting, which includes using toliet, bedpan, or urinal?: A Lot Help from another person bathing (including washing, rinsing, drying)?: A Lot Help from another person to put on and taking off regular upper body clothing?: A Little Help from another person to put on and taking off regular lower body clothing?: A Lot 6 Click Score: 16   End of Session Equipment Utilized During Treatment: Gait belt Nurse Communication: Mobility status  Activity Tolerance: Patient tolerated treatment well Patient left: in chair;with call bell/phone within reach  OT Visit Diagnosis: Unsteadiness on feet (R26.81);Pain Pain - Right/Left: Left Pain - part of body: Leg                Time: 8890-8862 OT Time Calculation (min): 28 min Charges:  OT General Charges $OT Visit: 1 Visit OT Evaluation $OT Eval Moderate Complexity: 1 Mod OT Treatments $Self Care/Home Management : 8-22 mins  09/21/2024  RP, OTR/L  Acute Rehabilitation Services  Office:  [phone]   Karen [name] 09/21/2024, 11:51 AM

## 2024-09-21 NOTE — Progress Notes (Signed)
   Inpatient Rehab Admissions Coordinator :  Per therapy recommendations, patient was screened for CIR candidacy by Heron Leavell RN MSN.  At this time [facility] not typical to approve AIR level rehab for amputations. Recommend other rehab venues to be pursued at this time. Please call me with any questions.  Heron Leavell RN MSN Admissions Coordinator [phone]

## 2024-09-21 NOTE — Progress Notes (Addendum)
  Progress Note    09/21/2024 8:26 AM 1 Day Post-Op  Subjective:  having sharp shooting pains down left leg. Says they are going into left buttock hip area. She says feels like its her sciatic nerve. However she has never had issues with this in the past. She explains that she was up all night with pain   Vitals:   09/20/24 2018 09/21/24 0358  BP: 139/61 125/63  Pulse: 77 80  Resp: 12 17  Temp: 98.7 F (37.1 C) 98.2 F (36.8 C)  SpO2: 93% 95%   Physical Exam: Cardiac:  regular Lungs:  non labored Incisions:  left BKA dressings in place, clean and dry Neurologic: alert and oriented   CBC    Component Value Date/Time   WBC 15.3 (H) 09/21/2024 0410   RBC 3.01 (L) 09/21/2024 0410   HGB 8.9 (L) 09/21/2024 0410   HGB 10.7 (L) 07/14/2024 1141   HCT 28.7 (L) 09/21/2024 0410   HCT 32.7 (L) 07/14/2024 1141   PLT 499 (H) 09/21/2024 0410   PLT 339 07/14/2024 1141   MCV 95.3 09/21/2024 0410   MCV 95 07/14/2024 1141   MCH 29.6 09/21/2024 0410   MCHC 31.0 09/21/2024 0410   RDW 13.6 09/21/2024 0410   RDW 13.0 07/14/2024 1141   LYMPHSABS 1.6 09/16/2024 1747   LYMPHSABS 1.6 07/14/2024 1141   MONOABS 1.4 (H) 09/16/2024 1747   EOSABS 0.2 09/16/2024 1747   EOSABS 0.3 07/14/2024 1141   BASOSABS 0.1 09/16/2024 1747   BASOSABS 0.1 07/14/2024 1141    BMET    Component Value Date/Time   NA 135 09/21/2024 0410   K 4.8 09/21/2024 0410   CL 94 (L) 09/21/2024 0410   CO2 26 09/21/2024 0410   GLUCOSE 153 (H) 09/21/2024 0410   BUN 41 (H) 09/21/2024 0410   CREATININE 6.07 (H) 09/21/2024 0410   CALCIUM  9.1 09/21/2024 0410   GFRNONAA 8 (L) 09/21/2024 0410   GFRAA 7 (L) 06/12/2018 0326    INR    Component Value Date/Time   INR 1.1 09/16/2024 1747     Intake/Output Summary (Last 24 hours) at 09/21/2024 0826 Last data filed at 09/20/2024 1615 Gross per 24 hour  Intake 100 ml  Output 1700 ml  Net -1600 ml     Assessment/Plan:  48 y.o. female is s/p left BKA 1 Day Post-Op    Having a lot of stump pain Added Robaxin to see if this will help. Sounds like she is having a lot of nerve type pain/ spasms Left BKA dressings will stay in place until tomorrow Right foot dressings keep in place. Management per Podiatry PT/OT to evaluate    Teretha Damme, PA-C [facility] [phone] 09/21/2024 8:26 AM  I have seen and evaluated the patient. I agree with the PA note as documented above.  Postop day 1 status post left BKA.  Will remove dressing tomorrow.  Having a lot of surgical pain and I did add IV Dilaudid  every 2 hours in addition to her oral regimen.  Lonni DOROTHA Gaskins, MD [facility] [place] Office: [phone]

## 2024-09-21 NOTE — Progress Notes (Signed)
 [facility] Progress Note   Subjective:   Having a lot of leg pain, just received pain meds. Denies SOB, CP, dizziness. She was itchy during dialysis yesterday but that has resolved.   Objective Vitals:   09/20/24 1606 09/20/24 1615 09/20/24 2018 09/21/24 0358  BP: 107/82 125/61 139/61 125/63  Pulse: 72 72 77 80  Resp: 12  12 17   Temp:   98.7 F (37.1 C) 98.2 F (36.8 C)  TempSrc:    Oral  SpO2: 100% 96% 93% 95%  Weight:      Height:       Physical Exam General: Alert female in NAD Heart: RRR, no murmurs, rubs or gallops Lungs: CTA bilaterally, respirations unlabored Abdomen: Soft, non-distended, +BS Extremities: L BKA, R foot wrapped, no edema appreciated Dialysis Access: AVF + t/b  Additional Objective Labs: Basic Metabolic Panel: Recent Labs  Lab 09/18/24 0327 09/20/24 0523 09/21/24 0410  NA 137 134* 135  K 4.4 5.9* 4.8  CL 94* 95* 94*  CO2 27 25 26   GLUCOSE 163* 138* 153*  BUN 33* 69* 41*  CREATININE 5.42* 9.54* 6.07*  CALCIUM  8.3* 8.4* 9.1  PHOS  --  6.9* 5.8*   Liver Function Tests: Recent Labs  Lab 09/16/24 1747 09/17/24 0457 09/18/24 0327 09/21/24 0410  AST 20 16 15   --   ALT 17 15 14   --   ALKPHOS 293* 255* 246*  --   BILITOT 0.8 0.9 0.6  --   PROT 7.5 7.3 7.2  --   ALBUMIN  2.6* 2.4* 2.3* 2.4*   Recent Labs  Lab 09/16/24 1950  LIPASE 23   CBC: Recent Labs  Lab 09/16/24 1747 09/17/24 0457 09/18/24 0327 09/20/24 0523 09/21/24 0410  WBC 19.4* 16.4* 12.1* 10.1 15.3*  NEUTROABS 16.0*  --   --   --   --   HGB 8.4* 8.7* 8.9* 8.0* 8.9*  HCT 26.8* 27.5* 29.6* 26.0* 28.7*  MCV 95.0 94.5 96.7 96.7 95.3  PLT 511* 495* 572* 422* 499*   Blood Culture    Component Value Date/Time   SDES PERITONEAL FLUID 09/16/2024 2107   SPECREQUEST Immunocompromised 09/16/2024 2107   CULT  09/16/2024 2107    NO GROWTH 3 DAYS Performed at [facility], [address], [place], [place] [address]    REPTSTATUS 09/20/2024 FINAL  09/16/2024 2107    Cardiac Enzymes: No results for input(s): CKTOTAL, CKMB, CKMBINDEX, TROPONINI in the last 168 hours. CBG: Recent Labs  Lab 09/19/24 2353 09/20/24 0418 09/20/24 0646 09/20/24 0857 09/20/24 1656  GLUCAP 140* 125* 130* 131* 131*   Iron Studies: No results for input(s): IRON, TIBC, TRANSFERRIN, FERRITIN in the last 72 hours. @lablastinr3 @ Studies/Results: No results found. Medications:  ceFEPime (MAXIPIME) IV 1 g (09/20/24 2259)   vancomycin  1,000 mg (09/20/24 1958)    (feeding supplement) PROSource Plus  30 mL Oral BID BM   aspirin  EC  81 mg Oral Daily   brimonidine  1 drop Both Eyes TID   Chlorhexidine  Gluconate Cloth  6 each Topical Q0600   Chlorhexidine  Gluconate Cloth  6 each Topical Q0600   cinacalcet   90 mg Oral Q breakfast   gabapentin   100 mg Oral QHS   gabapentin   200 mg Oral Daily   insulin  pump   Subcutaneous TID WC, HS, 0200   lanthanum   2,000 mg Oral TID WC   metoprolol  tartrate  12.5 mg Oral 2 times per day on Sunday Tuesday Thursday Saturday   multivitamin  1  tablet Oral q AM    Dialysis Orders:  MWF [facility] From last week -->  4h  B400   90kg 2K bath  AVF  Heparin  none  Assessment/Plan: 1.  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  Seen postoperatively and is clinically stable/doing well. 2. ESRD: Continue MWF schedule, next HD tomorrow 3. Anemia: Hgb 8.9. Will continue to follow trend and decide on need for ESA redosing +/- PRBC transfusion. 4. CKD-MBD: Calcium  level variable, slightly high today, not on VDRA. Phos 6.9 -> 5.8. Continue lanthanum . On sensipar  for PTH 5. Nutrition: Continue renal diet with oral nutritional supplementation to hasten wound healing. 6. Hypertension: Blood pressure currently at goal, monitor with hemodialysis/ultrafiltration.  Lucie Collet,  PA-C 09/21/2024, 8:30 AM  [facility] Pager: [phone]

## 2024-09-21 NOTE — Evaluation (Signed)
 Physical Therapy Evaluation Patient Details Name: Karen [name] MRN: [id number] DOB: 01/21/1976 Today's Date: 09/21/2024  History of Present Illness  Karen [name] is a 48 y.o. female admitted on 09/16/24 with infected left diabetic foot wound. 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  Clinical Impression   Pt admitted with above diagnosis. Lives with her sister (at time of writing this eval), in a single-level home with a few steps to enter; Prior to admission, pt was able to manage with a RW, prior to recent admission, pt was independent; Presents to PT with decr functional mobility, weakness, incr pain including phantom sensations; Pt is nervous about moving and painful during session, but able to perform a lateral scoot transfer recliiner back to bed with min assist; Per discussion with RN, there is concern that she may not be able to get back to her sister's home; REcommend SNF for post-acute rehab to maximize independence and safety with mobility and ADLs; Pt currently with functional limitations due to the deficits listed below (see PT Problem List). Pt will benefit from skilled PT to increase their independence and safety with mobility to allow discharge to the venue listed below.           If plan is discharge home, recommend the following: A little help with walking and/or transfers;A little help with bathing/dressing/bathroom;Assistance with cooking/housework;Assist for transportation;Help with stairs or ramp for entrance   Can travel by private vehicle    (perhaps soon)    Equipment Recommendations Wheelchair (measurements PT);Wheelchair cushion (measurements PT);Other (comment)  (consider long sliding board)  Recommendations for Other Services       Functional Status Assessment Patient has had a recent decline in their functional status and demonstrates the ability to make significant improvements in function in a reasonable and predictable amount of time.     Precautions / Restrictions Precautions Precautions: Fall Recall of Precautions/Restrictions: Intact Restrictions Weight Bearing Restrictions Per Provider Order: Yes RLE Weight Bearing Per Provider Order: Weight bearing as tolerated (in postop shoe) LLE Weight Bearing Per Provider Order: Non weight bearing Other Position/Activity Restrictions: WBAT R foot with post op shoe, NWB LLE with post op shoe      Mobility  Bed Mobility Overal bed mobility: Modified Independent Bed Mobility: Supine to Sit, Sit to Supine     Supine to sit: Supervision, HOB elevated       Patient Response: Cooperative  Transfers Overall transfer level: Needs assistance Equipment used: Rolling walker (2 wheels) Transfers: Bed to chair/wheelchair/BSC            Lateral/Scoot Transfers: Min assist General transfer comment: Cues for technique    Ambulation/Gait                  Stairs            Wheelchair Mobility     Tilt Bed Tilt Bed Patient Response: Cooperative  Modified Rankin (Stroke Patients Only)       Balance Overall balance assessment: Needs assistance Sitting-balance support: No upper extremity supported, Feet supported Sitting balance-Leahy Scale: Good  Pertinent Vitals/Pain Pain Assessment Faces Pain Scale: Hurts even more Pain Location: LLE Pain Descriptors / Indicators: Burning, Constant, Grimacing    Home Living Family/patient expects to be discharged to:: Private residence Living Arrangements: Other relatives (Will be staying with her sister) Available Help at Discharge: Family Type of Home: House Home Access:  Stairs to enter Entrance Stairs-Rails: Doctor, general practice of Steps: 3   Home Layout: One level Home Equipment: Rollator (4 wheels);Wheelchair - manual;Shower seat;Hand held shower head      Prior Function Prior Level of Function : Needs assist       Physical Assist : Mobility (physical);ADLs (physical) Mobility (physical): Transfers;Stairs ADLs (physical): Bathing;Dressing;Toileting;IADLs   ADLs Comments: Pt. was Mod a with bathing. Pt. was Max A with LE dressing.     Extremity/Trunk Assessment   Upper Extremity Assessment Upper Extremity Assessment: Defer to OT evaluation RUE Sensation: decreased light touch LUE Sensation: decreased light touch    Lower Extremity Assessment Lower Extremity Assessment: RLE deficits/detail;LLE deficits/detail RLE Deficits / Details: grossly 3/5 MMT; R great toe amputation dressing CDI, wrapped in Ace RLE Sensation: decreased light touch LLE Deficits / Details: post op transtibial amputation; very painful, including phantom pains and sensations; Able to actively extend knee fully       Communication   Communication Communication: No apparent difficulties    Cognition Arousal: Alert Behavior During Therapy: [facility] for tasks assessed/performed                             Following commands: Intact       Cueing Cueing Techniques: Verbal cues, Tactile cues     General Comments General comments (skin integrity, edema, etc.): Educated on desensitization, and importance of stretching hamstrings and hip flexors    Exercises Other Exercises Other Exercises: Hamstring stretch LLE sitting EOB Other Exercises: Modified piriformis stretch with LLE adducted over R knee and forward trunk fold/hip flexion   Assessment/Plan    PT Assessment Patient needs continued PT services  PT Problem List Decreased strength;Decreased range of motion;Decreased activity tolerance;Decreased balance;Decreased mobility;Decreased  coordination;Decreased knowledge of use of DME;Decreased safety awareness;Decreased knowledge of precautions;Pain       PT Treatment Interventions DME instruction;Stair training;Functional mobility training;Therapeutic activities;Therapeutic exercise;Balance training;Neuromuscular re-education;Cognitive remediation;Patient/family education;Wheelchair mobility training;Manual techniques    PT Goals (Current goals can be found in the Care Plan section)  Acute Rehab PT Goals Patient Stated Goal: less pain PT Goal Formulation: With patient Time For Goal Achievement: 10/05/24 Potential to Achieve Goals: Good    Frequency Min 3X/week     Co-evaluation               AM-PAC PT 6 Clicks Mobility  Outcome Measure Help needed turning from your back to your side while in a flat bed without using bedrails?: None Help needed moving from lying on your back to sitting on the side of a flat bed without using bedrails?: None Help needed moving to and from a bed to a chair (including a wheelchair)?: A Little Help needed standing up from a chair using your arms (e.g., wheelchair or bedside chair)?: Total Help needed to walk in hospital room?: Total Help needed climbing 3-5 steps with a railing? : Total 6 Click Score: 14    End of Session Equipment Utilized During Treatment: Gait belt Activity Tolerance: Patient tolerated treatment well Patient left: in bed;with call bell/phone within reach;with bed alarm set Nurse Communication: Mobility status PT Visit Diagnosis:  Unsteadiness on feet (R26.81);Other abnormalities of gait and mobility (R26.89);Muscle weakness (generalized) (M62.81)    Time: 8758-8693 PT Time Calculation (min) (ACUTE ONLY): 25 min   Charges:   PT Evaluation $PT Eval Moderate Complexity: 1 Mod PT Treatments $Therapeutic Activity: 8-22 mins PT General Charges $$ ACUTE PT VISIT: 1 Visit        Silvano Currier, PT  Acute Rehabilitation Services Office  [phone] Secure Chat welcomed   Silvano VEAR Currier 09/21/2024, 6:00 PM

## 2024-09-21 NOTE — Progress Notes (Signed)
 PROGRESS NOTE   Karen [name]  FMW:[id number]    DOB: Feb 26, 1976    DOA: 09/16/2024  PCP: Lesa Jon HERO, PA   I have briefly reviewed patients previous medical records in [facility].   Brief Hospital Course:   48 y.o. female with history of ESRD on hemodialysis has had recent peritoneal catheter placement with history of CAD status post PCI in February 2025 who was recently admitted to the hospital for diabetic foot infection underwent left foot transmetatarsal amputation and right foot third toe amputation on September 10, 2024 and also underwent angiogram with [facility] angioplasty of the left popliteal artery was discharged home on September 13, 2024 has been experiencing weakness fever chills last 2-3 days.  In ER patient was noted to have a fever, tachycardia, leukocytosis and discolored transmetatarsal flap.  Patient was started on empiric antibiotics.  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  Postop with significant pain, some nausea and vomiting.  Not felt to be appropriate for AIR by coordinator.   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 PAD S/p DCB angioplasty of left popliteal artery last week Sepsis ruled out on admission. S/p left BKA under regional anesthesia on 10/20 by vascular surgery. Continue empirically started IV vancomycin  and cefepime.  Postop antibiotics, choice and duration deferred to vascular surgery. Remains on aspirin  and Plavix .  Intolerant to statins. Significant postop pain last night into this morning.  Surgeon suggesting pain meds.  OT recommended AIR but inpatient rehab admissions coordinator screened and did not think patient was appropriate for AIR (insurance not likely to approve for  amputations).  Nausea and vomiting Had resolved but had an episode of nonbloody emesis again this morning, states that this happened after she took the Rena-Vite. Treat supportively, added PPI.  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 Reasonably controlled in house on her insulin  pump. Continue current dose of gabapentin .   ESRD on MWF HD Hyperkalemia Nephrology following for HD needs.  Last HD 10/20.  Hyperkalemia resolved.  Right hip pain Patient feels that she twisted her hip at home.  X-rays right hip and lumbar spine without acute findings. Continue multimodality pain control including gabapentin , lidocaine  patch.  Requested PT evaluation-input pending.  Better.  Essential hypertension Mostly controlled on metoprolol  to tartrate 12.5 mg twice daily on nondialysis days.  Uncontrolled this morning but might have been related to pain.  Did not see repeat BP documented since then  Anemia of ESRD Stable.  Trend CBC.  S/p right third toe amputation Healing well as per podiatry follow-up 10/17.  They changed dressing that day and do not recommend change of dressing for another week or so and if it is changed would recommend Xeroform 4 x 4 gauze Kerlix Ace roll.  Sutures will come out in 1 to 2 weeks.  WBAT on right foot in postop shoe.  Outpatient follow-up with Dr. Malvin.  Body mass index is 33.13 kg/m./Class I obesity Complicates care.   DVT prophylaxis: SCD's Start: 09/20/24 1016    Code Status: Full Code:  Family Communication: None at bedside. Disposition:  Status is: Inpatient Significant postop pain and some emesis.  Needs to improve further prior to DC.     Consultants:  Podiatry Vascular surgery Nephrology  Procedures:   As above  Subjective:  Seen this morning.  Therapy at bedside.  She stated that she had a rough night with severe pain all night with pain radiating from surgical site to left hip and  she feels that it is her piriformis that is acting up.  She informed us  that surgeons had adjusted her pain meds.  Vomited x 1, nonbloody after Rena-Vite and feels that that might have been the case.  Objective:   Vitals:   09/20/24 2018 09/21/24 0358 09/21/24 0934 09/21/24 0958  BP: 139/61 125/63 (!) 201/82 (!) 162/90  Pulse: 77 80 87   Resp: 12 17 19    Temp: 98.7 F (37.1 C) 98.2 F (36.8 C) 98 F (36.7 C)   TempSrc:  Oral    SpO2: 93% 95% 100%   Weight:      Height:        General exam: Young female, moderately built and obese sitting up in bed brushing her teeth.  Did not appear in any distress. Respiratory system: Clear to auscultation.  No increased work of breathing. Cardiovascular system: S1 & S2 heard, RRR. No JVD, murmurs, rubs, gallops or clicks. No pedal edema.   Gastrointestinal system: Abdomen is nondistended, soft and nontender. No organomegaly or masses felt. Normal bowel sounds heard. Central nervous system: Alert and oriented. No focal neurological deficits. Extremities: Symmetric 5 x 5 power.  Left BKA postop dressing remains clean and dry.  Right foot postop dressing clean and dry. Skin: No rashes, lesions or ulcers Psychiatry: Judgement and insight appear normal. Mood & affect appropriate.     Data Reviewed:   I have personally reviewed following labs and imaging studies   CBC: Recent Labs  Lab 09/16/24 1747 09/17/24 0457 09/18/24 0327 09/20/24 0523 09/21/24 0410  WBC 19.4*   < > 12.1* 10.1 15.3*  NEUTROABS 16.0*  --   --   --   --   HGB 8.4*   < > 8.9* 8.0* 8.9*  HCT 26.8*   < > 29.6* 26.0* 28.7*  MCV 95.0   < > 96.7 96.7 95.3  PLT 511*   < > 572* 422* 499*   < > = values in this interval not displayed.    Basic Metabolic Panel: Recent Labs  Lab 09/16/24 1747 09/17/24 0457 09/18/24 0327 09/20/24 0523 09/21/24 0410  NA 136 134* 137 134* 135  K 4.2 4.3 4.4 5.9* 4.8  CL 93* 95* 94* 95* 94*  CO2 24 21* 27 25 26   GLUCOSE 134* 235* 163*  138* 153*  BUN 40* 46* 33* 69* 41*  CREATININE 6.70* 7.48* 5.42* 9.54* 6.07*  CALCIUM  8.3* 8.6* 8.3* 8.4* 9.1  PHOS  --   --   --  6.9* 5.8*    Liver Function Tests: Recent Labs  Lab 09/16/24 1747 09/17/24 0457 09/18/24 0327 09/21/24 0410  AST 20 16 15   --   ALT 17 15 14   --   ALKPHOS 293* 255* 246*  --   BILITOT 0.8 0.9 0.6  --   PROT 7.5 7.3 7.2  --   ALBUMIN  2.6* 2.4* 2.3* 2.4*    CBG: Recent Labs  Lab 09/20/24 1656 09/21/24 0936 09/21/24 1117  GLUCAP 131* 165* 163*    Microbiology Studies:   Recent Results (from the past 240 hours)  Culture, blood (Routine x 2)     Status: None   Collection Time: 09/16/24  5:10 PM   Specimen: BLOOD RIGHT  ARM  Result Value Ref Range Status   Specimen Description BLOOD RIGHT ARM  Final   Special Requests   Final    BOTTLES DRAWN AEROBIC AND ANAEROBIC Blood Culture results may not be optimal due to an inadequate volume of blood received in culture bottles   Culture   Final    NO GROWTH 5 DAYS Performed at [facility], [address] [place], [place] [address]    Report Status 09/21/2024 FINAL  Final  Culture, blood (Routine x 2)     Status: None   Collection Time: 09/16/24  5:42 PM   Specimen: BLOOD RIGHT ARM  Result Value Ref Range Status   Specimen Description BLOOD RIGHT ARM  Final   Special Requests   Final    BOTTLES DRAWN AEROBIC AND ANAEROBIC Blood Culture results may not be optimal due to an inadequate volume of blood received in culture bottles   Culture   Final    NO GROWTH 5 DAYS Performed at [facility], [address], [place], [place] [address]    Report Status 09/21/2024 FINAL  Final  Body fluid culture w Gram Stain     Status: None   Collection Time: 09/16/24  9:07 PM   Specimen: Peritoneal Washings; Body Fluid  Result Value Ref Range Status   Specimen Description PERITONEAL FLUID  Final   Special Requests Immunocompromised  Final   Gram Stain NO ORGANISMS SEEN NO WBC SEEN CYTOSPIN  SMEAR   Final   Culture   Final    NO GROWTH 3 DAYS Performed at [facility], [address] [place], [place] [address]    Report Status 09/20/2024 FINAL  Final  MRSA Next Gen by PCR, Nasal     Status: None   Collection Time: 09/17/24  4:48 AM   Specimen: Nasal Mucosa; Nasal Swab  Result Value Ref Range Status   MRSA by PCR Next Gen NOT DETECTED NOT DETECTED Final    Comment: (NOTE) The GeneXpert MRSA Assay (FDA approved for NASAL specimens only), is one component of a comprehensive MRSA colonization surveillance program. It is not intended to diagnose MRSA infection nor to guide or monitor treatment for MRSA infections. Test performance is not FDA approved in patients less than 38 years old. Performed at [facility], [address], [place], [place] [address]     Radiology Studies:  No results found.   Scheduled Meds:    (feeding supplement) PROSource Plus  30 mL Oral BID BM   aspirin  EC  81 mg Oral Daily   brimonidine  1 drop Both Eyes TID   Chlorhexidine  Gluconate Cloth  6 each Topical Q0600   Chlorhexidine  Gluconate Cloth  6 each Topical Q0600   cinacalcet   90 mg Oral Q breakfast   gabapentin   100 mg Oral QHS   gabapentin   200 mg Oral Daily   insulin  pump   Subcutaneous TID WC, HS, 0200   lanthanum   2,000 mg Oral TID WC   metoprolol  tartrate  12.5 mg Oral 2 times per day on Sunday Tuesday Thursday Saturday   multivitamin  1 tablet Oral q AM   vancomycin  variable dose per unstable renal function (pharmacist dosing)   Does not apply See admin instructions    Continuous Infusions:    ceFEPime (MAXIPIME) IV 1 g (09/20/24 2259)     LOS: 5 days     Trenda Mar, MD,  FACP, [facility], [facility], [facility]   [facility]ician Advisor [place]      To contact the attending provider between 7A-7P or the covering provider during after hours 7P-7A, please log into the web site [email or web address] and access using universal [place] password  for that web site. If you do not have the password, please call the hospital operator.  09/21/2024, 5:04 PM

## 2024-09-22 DIAGNOSIS — A419 Sepsis, unspecified organism: Secondary | ICD-10-CM | POA: Diagnosis not present

## 2024-09-22 LAB — RENAL FUNCTION PANEL
Albumin: 2.5 g/dL — ABNORMAL LOW (ref 3.5–5.0)
Anion gap: 21 — ABNORMAL HIGH (ref 5–15)
BUN: 58 mg/dL — ABNORMAL HIGH (ref 6–20)
CO2: 22 mmol/L (ref 22–32)
Calcium: 8.9 mg/dL (ref 8.9–10.3)
Chloride: 91 mmol/L — ABNORMAL LOW (ref 98–111)
Creatinine, Ser: 7.83 mg/dL — ABNORMAL HIGH (ref 0.44–1.00)
GFR, Estimated: 6 mL/min — ABNORMAL LOW (ref >60)
Glucose, Bld: 118 mg/dL — ABNORMAL HIGH (ref 70–99)
Phosphorus: 7.3 mg/dL — ABNORMAL HIGH (ref 2.5–4.6)
Potassium: 5.6 mmol/L — ABNORMAL HIGH (ref 3.5–5.1)
Sodium: 134 mmol/L — ABNORMAL LOW (ref 135–145)

## 2024-09-22 LAB — CBC
HCT: 30.6 % — ABNORMAL LOW (ref 36.0–46.0)
Hemoglobin: 9.4 g/dL — ABNORMAL LOW (ref 12.0–15.0)
MCH: 29.1 pg (ref 26.0–34.0)
MCHC: 30.7 g/dL (ref 30.0–36.0)
MCV: 94.7 fL (ref 80.0–100.0)
Platelets: 488 K/uL — ABNORMAL HIGH (ref 150–400)
RBC: 3.23 MIL/uL — ABNORMAL LOW (ref 3.87–5.11)
RDW: 13.7 % (ref 11.5–15.5)
WBC: 11.6 K/uL — ABNORMAL HIGH (ref 4.0–10.5)
nRBC: 0 % (ref 0.0–0.2)

## 2024-09-22 LAB — GLUCOSE, CAPILLARY
Glucose-Capillary: 117 mg/dL — ABNORMAL HIGH (ref 70–99)
Glucose-Capillary: 124 mg/dL — ABNORMAL HIGH (ref 70–99)
Glucose-Capillary: 123 mg/dL — ABNORMAL HIGH (ref 70–99)
Glucose-Capillary: 114 mg/dL — ABNORMAL HIGH (ref 70–99)
Glucose-Capillary: 131 mg/dL — ABNORMAL HIGH (ref 70–99)
Glucose-Capillary: 146 mg/dL — ABNORMAL HIGH (ref 70–99)

## 2024-09-22 LAB — HEPATITIS B SURFACE ANTIGEN: Hepatitis B Surface Ag: NONREACTIVE

## 2024-09-22 LAB — VANCOMYCIN, RANDOM: Vancomycin Rm: 31 ug/mL

## 2024-09-22 MED ORDER — OXYCODONE HCL 5 MG PO TABS
ORAL_TABLET | ORAL | Status: AC
  Filled 2024-09-22: qty 2

## 2024-09-22 MED ORDER — LANTHANUM CARBONATE 1000 MG PO PACK
2000.0000 mg | PACK | Freq: Three times a day (TID) | ORAL | Status: DC
Start: 2024-09-22 — End: 2024-09-30
  Administered 2024-09-22 – 2024-09-29 (×12): 2000 mg via ORAL
  Filled 2024-09-22 (×22): qty 2

## 2024-09-22 MED ORDER — FENTANYL CITRATE (PF) 50 MCG/ML IJ SOSY
PREFILLED_SYRINGE | INTRAMUSCULAR | Status: AC
  Filled 2024-09-22: qty 1

## 2024-09-22 MED ORDER — HYDROMORPHONE HCL 1 MG/ML IJ SOLN
INTRAMUSCULAR | Status: AC
  Filled 2024-09-22: qty 0.5

## 2024-09-22 NOTE — Progress Notes (Incomplete)
 PHARMACY ANTIBIOTIC CONSULT NOTE   Karen [name] a 48 y.o. female s/p L BKA for osteomyelitis. PT still with R foot wounds with planned DOT 10-14d.  Pharmacy has been consulted for Vancomycin /CFP dosing.   Estimated Creatinine Clearance: 9.8 mL/min (A) (by C-G formula based on SCr of 7.83 mg/dL (H)). Vd used 81.54  Vancomycin  random level=42 (10/19 0542) Dialysis- 3 hour session, BFR 400 (10/20 ~16:20)>>expected post-HD level ~28 Vancomycin  1g re-dosed (10/20 1958)>>expected level ~40  Dialysis- ordered 4 hour session, BFR 400   Plan: Cefepime 2g MWF QHD   {VancAAP:27598} {ABXmonitor:27601}  Allergies:  Allergies  Allergen Reactions   Furosemide Swelling   Heparin  Other (See Comments)    Patient relates vitreous hemorrhage after heparin . Patient relates vitreous hemorrhage after heparin .    Filed Weights   09/17/24 1316 09/20/24 0639 09/22/24 0903  Weight: 90.3 kg (199 lb 1.2 oz) 90.3 kg (199 lb 1.2 oz) 90.6 kg (199 lb 11.8 oz)       Latest Ref Rng & Units 09/22/2024    5:21 AM 09/21/2024    4:10 AM 09/20/2024    5:23 AM  CBC  WBC 4.0 - 10.5 K/uL 11.6  15.3  10.1   Hemoglobin 12.0 - 15.0 g/dL 9.4  8.9  8.0   Hematocrit 36.0 - 46.0 % 30.6  28.7  26.0   Platelets 150 - 400 K/uL 488  499  422     Antibiotics Given (last 72 hours)     Date/Time Action Medication Dose Rate   09/19/24 2159 New Bag/Given   ceFEPIme (MAXIPIME) 1 g in sodium chloride  0.9 % 100 mL IVPB 1 g 200 mL/hr   09/20/24 1958 New Bag/Given   vancomycin  (VANCOCIN ) IVPB 1000 mg/200 mL premix 1,000 mg 200 mL/hr   09/20/24 2259 New Bag/Given   ceFEPIme (MAXIPIME) 1 g in sodium chloride  0.9 % 100 mL IVPB 1 g 200 mL/hr   09/21/24 2205 New Bag/Given   ceFEPIme (MAXIPIME) 1 g in sodium chloride  0.9 % 100 mL IVPB 1 g 200 mL/hr       Antimicrobials this admission: Vancomycin  10/16>> Cefepime 10/16>>  Microbiology results: 10/16 Bcx: negF 10/16 peritoneal fluid cx: negF 10/17 MRSA PCR:  negative    Thank you for allowing pharmacy to be a part of this patient's care.  Massie Fila, PharmD Clinical Pharmacist  09/22/2024 12:55 PM

## 2024-09-22 NOTE — Progress Notes (Addendum)
  Progress Note    09/22/2024 7:22 AM 2 Days Post-Op  Subjective:  says she is ready to get the bandage off as it feels tight.  Says she does not want to see bc it will make her sick.  Dilaudid  did help some.  RN reports she is going for dialysis today.   Afebrile  Vitals:   09/21/24 2001 09/22/24 0408  BP: (!) 190/90 (!) 146/84  Pulse: 88 72  Resp: 18 18  Temp: 97.9 F (36.6 C) 97.8 F (36.6 C)  SpO2: 100% 100%    Physical Exam: Incisions:      CBC    Component Value Date/Time   WBC 11.6 (H) 09/22/2024 0521   RBC 3.23 (L) 09/22/2024 0521   HGB 9.4 (L) 09/22/2024 0521   HGB 10.7 (L) 07/14/2024 1141   HCT 30.6 (L) 09/22/2024 0521   HCT 32.7 (L) 07/14/2024 1141   PLT 488 (H) 09/22/2024 0521   PLT 339 07/14/2024 1141   MCV 94.7 09/22/2024 0521   MCV 95 07/14/2024 1141   MCH 29.1 09/22/2024 0521   MCHC 30.7 09/22/2024 0521   RDW 13.7 09/22/2024 0521   RDW 13.0 07/14/2024 1141   LYMPHSABS 1.6 09/16/2024 1747   LYMPHSABS 1.6 07/14/2024 1141   MONOABS 1.4 (H) 09/16/2024 1747   EOSABS 0.2 09/16/2024 1747   EOSABS 0.3 07/14/2024 1141   BASOSABS 0.1 09/16/2024 1747   BASOSABS 0.1 07/14/2024 1141    BMET    Component Value Date/Time   NA 135 09/21/2024 0410   K 4.8 09/21/2024 0410   CL 94 (L) 09/21/2024 0410   CO2 26 09/21/2024 0410   GLUCOSE 153 (H) 09/21/2024 0410   BUN 41 (H) 09/21/2024 0410   CREATININE 6.07 (H) 09/21/2024 0410   CALCIUM  9.1 09/21/2024 0410   GFRNONAA 8 (L) 09/21/2024 0410   GFRAA 7 (L) 06/12/2018 0326    INR    Component Value Date/Time   INR 1.1 09/16/2024 1747     Intake/Output Summary (Last 24 hours) at 09/22/2024 9277 Last data filed at 09/22/2024 0600 Gross per 24 hour  Intake 1239.93 ml  Output 0 ml  Net 1239.93 ml     Assessment/Plan:  48 y.o. female is s/p left below knee amputation 09/20/2024 by Dr. Gretta 2 Days Post-Op   -dressing removed and left BKA stump looks good -dry dressing replaced but not tight.   She tolerated dressing change well. -she will f/u in our office in 4-5 weeks for staple removal.   Lucie Apt, PA-C [facility] [phone] 09/22/2024 7:22 AM   I have seen and evaluated the patient. I agree with the PA note as documented above.  BKA looks good.  Will arrange follow-up in our office in about 1 month for removal of staples.  Lonni DOROTHA Gretta, MD [facility] [place] Office: [phone]

## 2024-09-22 NOTE — Progress Notes (Signed)
   09/22/24 1352  Vitals  Temp 97.7 F (36.5 C)  BP 131/83  Pulse Rate 81  ECG Heart Rate 82  Resp 13  Weight 85 kg  Type of Weight Post-Dialysis  Oxygen Therapy  SpO2 100 %  O2 Device Nasal Cannula  O2 Flow Rate (L/min) 2 L/min  Patient Activity (if Appropriate) In bed  Pulse Oximetry Type Continuous  Oximetry Probe Site Changed No  During Treatment Monitoring  Blood Flow Rate (mL/min) 0 mL/min  Arterial Pressure (mmHg) -1.21 mmHg  Venous Pressure (mmHg) -1.61 mmHg  TMP (mmHg) -52.32 mmHg  Ultrafiltration Rate (mL/min) 660 mL/min  Dialysate Flow Rate (mL/min) 299 ml/min  Duration of HD Treatment -hour(s) 3.96 hour(s)  Cumulative Fluid Removed (mL) per Treatment  1971.58  Post Treatment  Dialyzer Clearance Lightly streaked  Liters Processed 85  Fluid Removed (mL) 2 mL  Tolerated HD Treatment Yes  AVG/AVF Arterial Site Held (minutes) 5 minutes  AVG/AVF Venous Site Held (minutes) 5 minutes   Received patient in bed to unit.  Alert and oriented.  Informed consent signed and in chart.   TX duration:4  Patient tolerated well.  Transported back to the room  Alert, without acute distress.  Hand-off given to patient's nurse.   Access used: LUA fistula Access issues: none  Total UF removed: 2000 Medication(s) given: dilaudid  x2, fentanyl  x 1, oxy 10mg  x1 Post HD VS: see above Post HD weight: n/a   Docia CHRISTELLA [facility]

## 2024-09-22 NOTE — Progress Notes (Signed)
 [facility] Progress Note   Subjective:   Still having a lot of pain. Denies SOB, CP, dizziness, nausea. On insulin  pump and requests to be changed to a renal/carb modified diet so she can track her carbs.   Objective Vitals:   09/21/24 1748 09/21/24 2001 09/22/24 0408 09/22/24 0731  BP: (!) 186/82 (!) 190/90 (!) 146/84 (!) 157/89  Pulse: 85 88 72 77  Resp: 19 18 18 18   Temp: 98 F (36.7 C) 97.9 F (36.6 C) 97.8 F (36.6 C) 98.2 F (36.8 C)  TempSrc:  Oral Oral   SpO2: 95% 100% 100% 100%  Weight:      Height:       Physical Exam General: Alert female in NAD Heart: RRR, no murmurs, rubs or gallops Lungs: CTA bilaterally, respirations unlabored Abdomen: Soft, non-distended, +BS Extremities: L BKA, R foot wrapped, no edema appreciated Dialysis Access: AVF + t/b  Additional Objective Labs: Basic Metabolic Panel: Recent Labs  Lab 09/18/24 0327 09/20/24 0523 09/21/24 0410  NA 137 134* 135  K 4.4 5.9* 4.8  CL 94* 95* 94*  CO2 27 25 26   GLUCOSE 163* 138* 153*  BUN 33* 69* 41*  CREATININE 5.42* 9.54* 6.07*  CALCIUM  8.3* 8.4* 9.1  PHOS  --  6.9* 5.8*   Liver Function Tests: Recent Labs  Lab 09/16/24 1747 09/17/24 0457 09/18/24 0327 09/21/24 0410  AST 20 16 15   --   ALT 17 15 14   --   ALKPHOS 293* 255* 246*  --   BILITOT 0.8 0.9 0.6  --   PROT 7.5 7.3 7.2  --   ALBUMIN  2.6* 2.4* 2.3* 2.4*   Recent Labs  Lab 09/16/24 1950  LIPASE 23   CBC: Recent Labs  Lab 09/16/24 1747 09/17/24 0457 09/18/24 0327 09/20/24 0523 09/21/24 0410 09/22/24 0521  WBC 19.4* 16.4* 12.1* 10.1 15.3* 11.6*  NEUTROABS 16.0*  --   --   --   --   --   HGB 8.4* 8.7* 8.9* 8.0* 8.9* 9.4*  HCT 26.8* 27.5* 29.6* 26.0* 28.7* 30.6*  MCV 95.0 94.5 96.7 96.7 95.3 94.7  PLT 511* 495* 572* 422* 499* 488*   Blood Culture    Component Value Date/Time   SDES PERITONEAL FLUID 09/16/2024 2107   SPECREQUEST Immunocompromised 09/16/2024 2107   CULT  09/16/2024 2107    NO  GROWTH 3 DAYS Performed at [facility] Lab, [address], [place], [place] [address]    REPTSTATUS 09/20/2024 FINAL 09/16/2024 2107    Cardiac Enzymes: No results for input(s): CKTOTAL, CKMB, CKMBINDEX, TROPONINI in the last 168 hours. CBG: Recent Labs  Lab 09/21/24 1748 09/21/24 2037 09/22/24 0141 09/22/24 0411 09/22/24 0733  GLUCAP 222* 192* 117* 124* 123*   Iron Studies: No results for input(s): IRON, TIBC, TRANSFERRIN, FERRITIN in the last 72 hours. @lablastinr3 @ Studies/Results: No results found. Medications:  ceFEPime (MAXIPIME) IV 1 g (09/21/24 2205)    (feeding supplement) PROSource Plus  30 mL Oral BID BM   aspirin  EC  81 mg Oral Daily   brimonidine  1 drop Both Eyes TID   cinacalcet   90 mg Oral Q breakfast   gabapentin   100 mg Oral QHS   gabapentin   200 mg Oral Daily   insulin  pump   Subcutaneous TID WC, HS, 0200   Lanthanum  Carbonate  2,000 mg Oral TID WC   metoprolol  tartrate  12.5 mg Oral 2 times per day on Sunday Tuesday Thursday Saturday   multivitamin  1  tablet Oral q AM   pantoprazole  40 mg Oral Daily   vancomycin  variable dose per unstable renal function (pharmacist dosing)   Does not apply See admin instructions    Dialysis Orders: MWF [facility] From last week -->  4h  B400   90kg 2K bath  AVF  Heparin  none  Assessment/Plan: 1.  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 ESRD: Continue MWF schedule, next HD todat 3. Anemia: Hgb 9.4-improving. Will continue to follow trend and decide on need for ESA redosing 4. CKD-MBD: Calcium  level variable, not on VDRA. Phos 6.9 -> 5.8. Continue lanthanum . On sensipar  for PTH 5. Nutrition: Continue renal diet with oral nutritional supplementation to hasten wound healing. Changed to renal/carb modified diet per her request so she can track her carbs, on insulin  pump.  6.  Hypertension: Blood pressure currently elevated but she is also in pain, monitor with hemodialysis/ultrafiltration.  Lucie Collet, PA-C 09/22/2024, 8:35 AM  [facility] Pager: [phone]

## 2024-09-22 NOTE — TOC Progression Note (Signed)
 Transition of Care [facility]) - Progression Note    Patient Details  Name: Karen [name] MRN: [id number] Date of Birth: 1976-11-18  Transition of Care [facility]) CM/SW Contact  Lendia Dais, [place] Phone Number: 09/22/2024, 11:19 AM  Clinical Narrative:  CSW spoke to pt at bedside about PT recs of SNF. CSW asked if the patient would be willing to do STR at a SNF and the pt declined and stated that Inpatient rehab facilities are the reason I am on dialysis and in the condition I am in. CSW stated that that CIR would have to assess to see if she was eligible. CSW stated that in the event that the pt is declined for CIR what would her plan be/ Pt stated she would return home and resume HH with [facility].   CSW spoke to CIR rep via secure chat and stated that they have denied the pt d/t [employer] medicare will not approve AIR level rehab amputations. CSW notified RNCM.  Please place consult for any TOC needs.                       Expected Discharge Plan and Services                                               Social Drivers of Health (SDOH) Interventions SDOH Screenings   Food Insecurity: No Food Insecurity (09/17/2024)  Recent Concern: Food Insecurity - Food Insecurity Present (08/25/2024)   [facility]  Housing: Low Risk  (09/17/2024)  Transportation Needs: No Transportation Needs (09/17/2024)  Utilities: Not At Risk (09/17/2024)  Financial Resource Strain: Medium Risk (07/28/2024)   Received from [facility]  Physical Activity: Inactive (07/28/2024)   Received from [facility]  Social Connections: Socially Integrated (07/28/2024)   Received from [facility]  Stress: No Stress Concern Present (08/25/2024)   Received from [facility]  Tobacco Use: Medium Risk (09/20/2024)    Readmission Risk Interventions    09/17/2024    3:11 PM  Readmission Risk Prevention Plan  Transportation Screening Complete  PCP or Specialist Appt  within 3-5 Days Complete  HRI or Home Care Consult Complete  Social Work Consult for Recovery Care Planning/Counseling Complete  Palliative Care Screening Not Applicable  Medication Review Oceanographer) Referral to Pharmacy

## 2024-09-22 NOTE — Plan of Care (Signed)
  Problem: Clinical Measurements: Goal: Diagnostic test results will improve Outcome: Completed/Met

## 2024-09-22 NOTE — Progress Notes (Signed)
 PHARMACY ANTIBIOTIC CONSULT NOTE   Karen [name] a 48 y.o. female s/p L BKA for osteomyelitis. PT still with R foot wounds with planned DOT 10-14d.  Pharmacy has been consulted for Vancomycin /CFP dosing.   Estimated Creatinine Clearance: 9.5 mL/min (A) (by C-G formula based on SCr of 7.83 mg/dL (H)). Vd used 81.54  Vancomycin  random level=42 (10/19 0542) Dialysis- 3 hour session, BFR 400 (10/20 ~16:20)>>expected post-HD level ~28 Vancomycin  1g re-dosed (10/20 1958)>>expected level ~40  Dialysis 10/22 4 hr, BFR ~361ml  Post HD level 10/22 = 31 mcg/ml  Plan: Cefepime 2g MWF QHD  No vancomycin  needed tonight with level still 31 mcg/ml post HD Will f/u next HD to schedule vancomycin   Allergies:  Allergies  Allergen Reactions   Furosemide Swelling   Heparin  Other (See Comments)    Patient relates vitreous hemorrhage after heparin . Patient relates vitreous hemorrhage after heparin .    Filed Weights   09/20/24 0639 09/22/24 0903 09/22/24 1352  Weight: 90.3 kg (199 lb 1.2 oz) 87 kg (191 lb 12.8 oz) 85 kg (187 lb 6.3 oz)       Latest Ref Rng & Units 09/22/2024    5:21 AM 09/21/2024    4:10 AM 09/20/2024    5:23 AM  CBC  WBC 4.0 - 10.5 K/uL 11.6  15.3  10.1   Hemoglobin 12.0 - 15.0 g/dL 9.4  8.9  8.0   Hematocrit 36.0 - 46.0 % 30.6  28.7  26.0   Platelets 150 - 400 K/uL 488  499  422     Antibiotics Given (last 72 hours)     Date/Time Action Medication Dose Rate   09/19/24 2159 New Bag/Given   ceFEPIme (MAXIPIME) 1 g in sodium chloride  0.9 % 100 mL IVPB 1 g 200 mL/hr   09/20/24 1958 New Bag/Given   vancomycin  (VANCOCIN ) IVPB 1000 mg/200 mL premix 1,000 mg 200 mL/hr   09/20/24 2259 New Bag/Given   ceFEPIme (MAXIPIME) 1 g in sodium chloride  0.9 % 100 mL IVPB 1 g 200 mL/hr   09/21/24 2205 New Bag/Given   ceFEPIme (MAXIPIME) 1 g in sodium chloride  0.9 % 100 mL IVPB 1 g 200 mL/hr       Antimicrobials this admission: Vancomycin  10/16>> Cefepime  10/16>>  Microbiology results: 10/16 Bcx: negF 10/16 peritoneal fluid cx: negF 10/17 MRSA PCR: negative    Thank you for allowing pharmacy to be a part of this patient's care.  Vito Ralph, PharmD, BCPS Please see amion for complete clinical pharmacist phone list 09/22/2024 7:45 PM

## 2024-09-22 NOTE — Procedures (Signed)
 Patient seen on Hemodialysis. BP 106/65 (BP Location: Right Leg)   Pulse 75   Temp 97.9 F (36.6 C)   Resp 10   Ht 5' 5 (1.651 m)   Wt 90.6 kg   SpO2 95%   BMI 33.24 kg/m   QB 400, UF goal 2L Tolerating treatment without complaints at this time.   Gordy Blanch MD [facility]. Office # [phone] Pager # [phone] 10:19 AM

## 2024-09-22 NOTE — Progress Notes (Signed)
 PROGRESS NOTE   Karen [name]  FMW:[id number]    DOB: November 10, 1976    DOA: 09/16/2024  PCP: Lesa Jon HERO, PA   I have briefly reviewed patients previous medical records in [facility].   Brief Hospital Course:   48 y.o. female with history of ESRD on hemodialysis has had recent peritoneal catheter placement with history of CAD status post PCI in February 2025 who was recently admitted to the hospital for diabetic foot infection underwent left foot transmetatarsal amputation and right foot third toe amputation on September 10, 2024 and also underwent angiogram with [facility] angioplasty of the left popliteal artery was discharged home on September 13, 2024 has been experiencing weakness fever chills last 2-3 days.  In ER patient was noted to have a fever, tachycardia, leukocytosis and discolored transmetatarsal flap.  Patient was started on empiric antibiotics.  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  Postop with significant pain, some nausea and vomiting.  Not felt to be appropriate for AIR by coordinator. 10/8 angiography second-order cannulation L popliteal artery drug-coated balloon angioplasty 4X 60 cm-Dr. Silver 10/20 BKA performed    Assessment & Plan:   Ischemic left foot at prior transmetatarsal amputation site, s/p BKA 10/20 S/p left foot TMA 10/10 for diabetic left foot infection/osteomyelitis. PAD Continues empiric cefepime and vancomycin  and narrow in 24 hours to orals Pain control Oxy IR 5 every 4 as needed moderate, Oxy 10 every 4 as needed severe with breakthrough Dilaudid  0.5 Q2 as needed Careful dosing gabapentin  100 at bedtime and 200 daily-careful dosing Robaxin 500 every 6 as needed  Nausea and vomiting Had episodic vomiting with Rena-Vite Treat supportively, added PPI.  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 Using insulin  pump-sugar is ranging 120s to 130s   ESRD on MWF HD Hyperkalemia Nephrology following for HD needs.  Last HD 10/20.  Hyperkalemia resolved. Continue Fosrenol  1000 as needed snack, phos renal 2000 3 times daily meal, Sensipar  90 daily,  Right hip pain Patient feels that she twisted her hip at home.  X-rays right hip and lumbar spine without acute findings. Pain meds as above-requested PT evaluation-awaiting inpatient rehab input otherwise wants to discharge home  Essential hypertension Mostly controlled on metoprolol  to tartrate 12.5 mg twice daily on Sunday Tuesday Thursday Saturday  Anemia of ESRD Stable.  Trend CBC.  S/p right third toe amputation Healing well as per podiatry follow-up 10/17.  They changed dressing that day and do not recommend change of dressing for another week or so and if it is changed would recommend Xeroform 4 x 4 gauze Kerlix Ace roll.  Sutures will come out in 1 to 2 weeks.  WBAT on right foot in postop shoe.  Outpatient follow-up with Dr. Malvin.  Body mass index is 31.18 kg/m./Class I obesity Complicates care.   DVT prophylaxis: SCD's Start: 09/20/24 1016    Code Status: Full Code:  Family Communication: None at bedside. Disposition:  Status is: Inpatient Significant postop pain and some emesis.  Needs to improve further prior to DC.     Consultants:   Podiatry Vascular surgery Nephrology  Procedures:   As above  Subjective:   Seen on the HD unit looking fair  pain moderate  Objective:   Vitals:   09/22/24 1300 09/22/24 1330 09/22/24 1338 09/22/24 1352  BP: 108/87 125/75 114/73  131/83  Pulse: 78 77 73 81  Resp: 18 (!) 25 (!) 26 13  Temp:    97.7 F (36.5 C)  TempSrc:      SpO2: 100% 100% 100% 100%  Weight:    85 kg  Height:        Awake coherent no distress EOMI NCAT no focal deficit no icterus no pallor S1-S2 no murmur Chest is clear Abdomen soft no rebound no guarding ROM  intact she does have a transmetatarsal amputation on the right leg and a left BKA on the left     Data Reviewed:   I have personally reviewed following labs and imaging studies   CBC: Recent Labs  Lab 09/16/24 1747 09/17/24 0457 09/20/24 0523 09/21/24 0410 09/22/24 0521  WBC 19.4*   < > 10.1 15.3* 11.6*  NEUTROABS 16.0*  --   --   --   --   HGB 8.4*   < > 8.0* 8.9* 9.4*  HCT 26.8*   < > 26.0* 28.7* 30.6*  MCV 95.0   < > 96.7 95.3 94.7  PLT 511*   < > 422* 499* 488*   < > = values in this interval not displayed.    Basic Metabolic Panel: Recent Labs  Lab 09/17/24 0457 09/18/24 0327 09/20/24 0523 09/21/24 0410 09/22/24 0521  NA 134* 137 134* 135 134*  K 4.3 4.4 5.9* 4.8 5.6*  CL 95* 94* 95* 94* 91*  CO2 21* 27 25 26 22   GLUCOSE 235* 163* 138* 153* 118*  BUN 46* 33* 69* 41* 58*  CREATININE 7.48* 5.42* 9.54* 6.07* 7.83*  CALCIUM  8.6* 8.3* 8.4* 9.1 8.9  PHOS  --   --  6.9* 5.8* 7.3*    Liver Function Tests: Recent Labs  Lab 09/16/24 1747 09/17/24 0457 09/18/24 0327 09/21/24 0410 09/22/24 0521  AST 20 16 15   --   --   ALT 17 15 14   --   --   ALKPHOS 293* 255* 246*  --   --   BILITOT 0.8 0.9 0.6  --   --   PROT 7.5 7.3 7.2  --   --   ALBUMIN  2.6* 2.4* 2.3* 2.4* 2.5*    CBG: Recent Labs  Lab 09/22/24 0141 09/22/24 0411 09/22/24 0733  GLUCAP 117* 124* 123*    Microbiology Studies:   Recent Results (from the past 240 hours)  Culture, blood (Routine x 2)     Status: None   Collection Time: 09/16/24  5:10 PM   Specimen: BLOOD RIGHT ARM  Result Value Ref Range Status   Specimen Description BLOOD RIGHT ARM  Final   Special Requests   Final    BOTTLES DRAWN AEROBIC AND ANAEROBIC Blood Culture results may not be optimal due to an inadequate volume of blood received in culture bottles   Culture   Final    NO GROWTH 5 DAYS Performed at [facility], [address], [place], [place] [address]    Report Status 09/21/2024 FINAL  Final  Culture,  blood (Routine x 2)     Status: None   Collection Time: 09/16/24  5:42 PM   Specimen: BLOOD RIGHT ARM  Result Value Ref Range Status   Specimen Description BLOOD RIGHT ARM  Final   Special Requests   Final    BOTTLES DRAWN AEROBIC AND ANAEROBIC Blood Culture results may not be optimal due to an inadequate volume of blood received in culture bottles   Culture   Final  NO GROWTH 5 DAYS Performed at [facility], [address], [place], [place] [address]    Report Status 09/21/2024 FINAL  Final  Body fluid culture w Gram Stain     Status: None   Collection Time: 09/16/24  9:07 PM   Specimen: Peritoneal Washings; Body Fluid  Result Value Ref Range Status   Specimen Description PERITONEAL FLUID  Final   Special Requests Immunocompromised  Final   Gram Stain NO ORGANISMS SEEN NO WBC SEEN CYTOSPIN SMEAR   Final   Culture   Final    NO GROWTH 3 DAYS Performed at [facility], [address], [place], [place] [address]    Report Status 09/20/2024 FINAL  Final  MRSA Next Gen by PCR, Nasal     Status: None   Collection Time: 09/17/24  4:48 AM   Specimen: Nasal Mucosa; Nasal Swab  Result Value Ref Range Status   MRSA by PCR Next Gen NOT DETECTED NOT DETECTED Final    Comment: (NOTE) The GeneXpert MRSA Assay (FDA approved for NASAL specimens only), is one component of a comprehensive MRSA colonization surveillance program. It is not intended to diagnose MRSA infection nor to guide or monitor treatment for MRSA infections. Test performance is not FDA approved in patients less than 72 years old. Performed at [facility], [address] [place], [place] [address]     Radiology Studies:  No results found.   Scheduled Meds:    (feeding supplement) PROSource Plus  30 mL Oral BID BM   aspirin  EC  81 mg Oral Daily   brimonidine  1 drop Both Eyes TID   cinacalcet   90 mg Oral Q breakfast   gabapentin   100 mg Oral QHS   gabapentin   200 mg Oral Daily   insulin   pump   Subcutaneous TID WC, HS, 0200   Lanthanum  Carbonate  2,000 mg Oral TID WC   metoprolol  tartrate  12.5 mg Oral 2 times per day on Sunday Tuesday Thursday Saturday   multivitamin  1 tablet Oral q AM   pantoprazole  40 mg Oral Daily   vancomycin  variable dose per unstable renal function (pharmacist dosing)   Does not apply See admin instructions    Continuous Infusions:    ceFEPime (MAXIPIME) IV 1 g (09/21/24 2205)    LOS: 6 days   Jai-Gurmukh Gemayel Mascio, MD,   To contact the attending provider between 7A-7P or the covering provider during after hours 7P-7A, please log into the web site [email or web address] and access using universal [place] password for that web site. If you do not have the password, please call the hospital operator.  09/22/2024, 2:24 PM

## 2024-09-23 ENCOUNTER — Ambulatory Visit: Admitting: Podiatry

## 2024-09-23 ENCOUNTER — Other Ambulatory Visit: Payer: Self-pay

## 2024-09-23 DIAGNOSIS — A419 Sepsis, unspecified organism: Secondary | ICD-10-CM | POA: Diagnosis not present

## 2024-09-23 DIAGNOSIS — I739 Peripheral vascular disease, unspecified: Secondary | ICD-10-CM

## 2024-09-23 LAB — GLUCOSE, CAPILLARY
Glucose-Capillary: 138 mg/dL — ABNORMAL HIGH (ref 70–99)
Glucose-Capillary: 126 mg/dL — ABNORMAL HIGH (ref 70–99)
Glucose-Capillary: 99 mg/dL (ref 70–99)
Glucose-Capillary: 108 mg/dL — ABNORMAL HIGH (ref 70–99)
Glucose-Capillary: 148 mg/dL — ABNORMAL HIGH (ref 70–99)
Glucose-Capillary: 140 mg/dL — ABNORMAL HIGH (ref 70–99)

## 2024-09-23 MED ORDER — POLYETHYLENE GLYCOL 3350 17 G PO PACK
17.0000 g | PACK | Freq: Every day | ORAL | Status: DC | PRN
  Administered 2024-09-23 – 2024-09-24 (×2): 17 g via ORAL
  Filled 2024-09-23 (×3): qty 1

## 2024-09-23 MED ORDER — ORAL CARE MOUTH RINSE: 15.0000 mL | OROMUCOSAL | Status: DC | PRN

## 2024-09-23 MED ORDER — VANCOMYCIN HCL 750 MG/150ML IV SOLN: 750.0000 mg | INTRAVENOUS | Status: DC

## 2024-09-23 MED ORDER — SENNA 8.6 MG PO TABS
1.0000 | ORAL_TABLET | Freq: Every day | ORAL | Status: DC
  Administered 2024-09-23 – 2024-09-29 (×5): 8.6 mg via ORAL
  Filled 2024-09-23 (×6): qty 1

## 2024-09-23 MED ORDER — CHLORHEXIDINE GLUCONATE CLOTH 2 % EX PADS
6.0000 | MEDICATED_PAD | Freq: Every day | CUTANEOUS | Status: DC
  Administered 2024-09-23: 6 via TOPICAL

## 2024-09-23 NOTE — Progress Notes (Signed)
 Nurse to Nurse Report: Patient received LBKA, M/W/F HD schedule, LA Fistula accessed yesterday during HD, removed, PD cath remains, ACHS + morning BG, telemetry box 18: NSR, LBM: 10/19 (Miralax added), patient is anuric, PIVRFA x2, Dilaudid  PRN established. Patient wants to return home with [facility] if CIR is not a possibility. T2DM Pump remains on patient and patient controlled  Received From [place], RN Neuro- No acute changes  HEENT- L eye blindness CV- Telemetry box 18: NSR Respiratory- No acute changes  GI- LBM: 10/19 (Miralax added)  GU- Anuric  Reproduction- No acute changes  MSK- Has dangled on bed since sx Skin- No acute changes  Behavior- High pain regiment  Plan- possible SNF (patient does not want this option), Home w/ HH, CIR  Shift Phone: [phone] Nurse Tech for Patient: Q, NT    Morning Assessment:  Neuro- HEENT- CV- Respiratory- GI- GU- Reproduction- MSK- Skin- Behavior-  Patient Endorses:   Patient Denies:   0800- 1000- 1200- 1400- 1600- 1800-  Evening Assessment:   This RN recommends:   Nurse to Nurse Report:   Given to   In the event that Epic Flowsheet is incomplete, please utilize this progress note for this RN shift assessment of patient. [Documented by exception]   Gwyneth Razor, RN

## 2024-09-23 NOTE — Plan of Care (Signed)
   Problem: Health Behavior/Discharge Planning: Goal: Ability to manage health-related needs will improve Outcome: Progressing

## 2024-09-23 NOTE — Progress Notes (Signed)
 PROGRESS NOTE   Karen [name]  FMW:[id number]    DOB: 1976-05-08    DOA: 09/16/2024  PCP: Lesa Jon HERO, PA   I have briefly reviewed patients previous medical records in [facility].   Brief Hospital Course:  48 y.o. 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 Admit 10/6-10/13 left foot transmetatarsal amputation and right foot third toe amputation on September 10, 2024 and also underwent angiogram with [facility] angioplasty of the left popliteal artery  10/16 re-admit weakness fever chills last 2-3 days--noted discolored transmetatarsal flap.  Patient was started on empiric antibiotics.  10/8 angiography second-order cannulation L popliteal artery drug-coated balloon angioplasty 4X 60 cm-Dr. 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 PAD Ceft/vanc flagyll d/c 10/23 after 7 days total Pain control Oxy IR 5 every 4 as needed moderate, Oxy 10 every 4 as needed severe with breakthrough Dilaudid  0.5 Q2 as needed Careful dosing gabapentin  100 at bedtime and 200 daily-careful dosing Robaxin 500 every 6 as needed Will d/c with pain meds ~ 20 days supply  Nausea and vomiting Had episodic vomiting with Rena-Vite--resolved Treat supportively, added PPI.  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 Using insulin  pump-CBG 96--126   ESRD on MWF HD Hyperkalemia Has Peritoneal Access which is not yet mature Nephrology following for HD needs.  Last HD 10/20.  Hyperkalemia resolved. Continue Fosrenol  1000 as needed snack, phos renal 2000 3 times daily meal, Sensipar  90 daily,  Right hip pain ?  twisted her hip at home.  X-rays right hip and lumbar spine without acute findings. Pain meds as above-requested PT evaluation-awaiting inpatient rehab input otherwise wants to discharge home  Essential hypertension Controlled--cont metoprolol  to tartrate 12.5 mg twice daily on Sunday Tuesday Thursday Saturday  Anemia of ESRD Stable.  Trend CBC.  S/p right third toe amputation Healing well as per podiatry follow-up 10/17.  They changed dressing that day and do not recommend change of dressing for another week or so   Xeroform 4 x 4 gauze Kerlix Ace roll.  Sutures will come out in 1 to 2 weeks.   WBAT on right foot in postop shoe.   Will ask Dr. Malvin to review in am as is 1 week out form surgery  Body mass index is 31.18 kg/m./Class I obesity Complicates care.   DVT prophylaxis: SCD's Start: 09/20/24 1016    Code Status: Full Code:  Family Communication: None at bedside. Disposition:  Status is: Inpatient   Likely d/c home with hh in am     Consultants:   Podiatry Vascular surgery Nephrology  Procedures:   As above  Subjective:   Moderate pain Worked with therapy No distress No fever no chills  Objective:   Vitals:   09/22/24 1608 09/22/24 2005 09/23/24 0615 09/23/24 1026  BP: 130/64 122/68 134/67 (!) 145/65  Pulse: 81 76 79 87  Resp: 19   19  Temp: 98.7 F (37.1 C) 98.9 F (37.2 C) 98.4 F (36.9 C) 98.6 F (37 C)  TempSrc: Oral Oral Oral   SpO2: 99% 99% 97% 100%  Weight:      Height:  Awake coherent no distress EOMI NCAT no focal deficit no icterus no pallor S1-S2 no murmur nsr Chest is clear Abdomen soft no rebound no guarding ROM intact she does have a transmetatarsal amputation on the right leg and 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    Basic Metabolic Panel: Recent Labs  Lab 09/17/24 0457 09/18/24 0327 09/20/24 0523 09/21/24 0410 09/22/24 0521  NA 134* 137 134* 135 134*  K 4.3 4.4 5.9* 4.8 5.6*  CL 95* 94* 95* 94* 91*  CO2 21* 27 25 26 22   GLUCOSE 235* 163* 138* 153* 118*  BUN 46* 33* 69* 41* 58*  CREATININE 7.48* 5.42* 9.54* 6.07* 7.83*  CALCIUM  8.6* 8.3* 8.4* 9.1 8.9  PHOS  --   --  6.9* 5.8* 7.3*    Liver Function Tests: Recent Labs  Lab 09/16/24 1747 09/17/24 0457 09/18/24 0327 09/21/24 0410 09/22/24 0521  AST 20 16 15   --   --   ALT 17 15 14   --   --   ALKPHOS 293* 255* 246*  --   --   BILITOT 0.8 0.9 0.6  --   --   PROT 7.5 7.3 7.2  --   --   ALBUMIN  2.6* 2.4* 2.3* 2.4* 2.5*    CBG: Recent Labs  Lab 09/23/24 0859 09/23/24 1029 09/23/24 1219  GLUCAP 126* 99 108*       Scheduled Meds:    (feeding supplement) PROSource Plus  30 mL Oral BID BM   aspirin  EC  81 mg Oral Daily   brimonidine  1 drop Both Eyes TID   Chlorhexidine  Gluconate Cloth  6 each Topical Q0600   cinacalcet   90 mg Oral Q breakfast   gabapentin   100 mg Oral QHS   gabapentin   200 mg Oral Daily   insulin  pump   Subcutaneous TID WC, HS, 0200   Lanthanum  Carbonate  2,000 mg Oral TID WC   metoprolol  tartrate  12.5 mg Oral 2 times per day on Sunday Tuesday Thursday Saturday   pantoprazole  40 mg Oral Daily      LOS: 7 days   Colen Grimes, MD,   To contact the attending provider between 7A-7P or the covering provider during after hours 7P-7A, please log into the web site [email or web address] and access using universal [place] password for that web site. If you do not have the password, please call the hospital operator.  09/23/2024, 3:57 PM

## 2024-09-23 NOTE — TOC Progression Note (Signed)
 Transition of Care [facility]) - Progression Note    Patient Details  Name: Karen [name] MRN: [id number] Date of Birth: September 02, 1976  Transition of [facility]) CM/SW Contact  Tom-Johnson, Harvest Muskrat, RN Phone Number: 09/23/2024, 1:58 PM  Clinical Narrative:     CM spoke with patient at bedside about discharging home with home health instead of discharging to SNF per patient's request. Patient states she will be staying at her sister's home with her mother and nephews. States she is enrolled with the CAP program and she is allocated 40 hrs/week Personal Care Services. Patient is active with [facility] and will resume at discharge. Wheelchair and Long Sliding Board recommended by PT, patient states she has a wheelchair. Sliding Board ordered from [employer] and [employer] to deliver to patient at Bedside.   CM will continue to follow as patient progresses with care towards discharge.                      Expected Discharge Plan and Services                                               Social Drivers of Health (SDOH) Interventions SDOH Screenings   Food Insecurity: No Food Insecurity (09/17/2024)  Recent Concern: Food Insecurity - Food Insecurity Present (08/25/2024)   Received from [facility]  Housing: Low Risk  (09/17/2024)  Transportation Needs: No Transportation Needs (09/17/2024)  Utilities: Not At Risk (09/17/2024)  Financial Resource Strain: Medium Risk (07/28/2024)   Received from [facility]  Physical Activity: Inactive (07/28/2024)   Received from [facility]  Social Connections: Socially Integrated (07/28/2024)   Received from [facility]  Stress: No Stress Concern Present (08/25/2024)   Received from [facility]  Tobacco Use: Medium Risk (09/20/2024)    Readmission Risk Interventions    09/17/2024    3:11 PM  Readmission Risk Prevention Plan  Transportation Screening Complete  PCP or Specialist Appt within 3-5 Days Complete  HRI or  Home Care Consult Complete  Social Work Consult for Recovery Care Planning/Counseling Complete  Palliative Care Screening Not Applicable  Medication Review Oceanographer) Referral to Pharmacy

## 2024-09-23 NOTE — Progress Notes (Signed)
 Physical Therapy Treatment Patient Details Name: Karen [name] MRN: [id number] DOB: Apr 03, 1976 Today's Date: 09/23/2024   History of Present Illness Karen [name] is a 48 y.o. female admitted on 09/16/24 with infected left diabetic foot wound. 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    PT Comments  Good progress, able to transfer with RW, stand pivot, and take several steps (hop) with min assist for balance and RW control. Denied by CIR, declines SNF. Plans to stay with a family member at d/c and will no longer have steps to navigate (acute goals updated.) Reviewed post-op precautions and early exercises. Patient will continue to benefit from skilled physical therapy services to further improve independence with functional mobility.     If plan is discharge home, recommend the following: A little help with walking and/or transfers;A little help with bathing/dressing/bathroom;Assistance with cooking/housework;Assist for transportation;Help with stairs or ramp for entrance   Can travel by private vehicle      (perhaps soon)  Equipment Recommendations  Wheelchair (measurements PT);Wheelchair cushion (measurements PT);Other (comment) (consider long sliding board)    Recommendations for Other Services       Precautions / Restrictions Precautions Precautions: Fall Recall of Precautions/Restrictions: Intact Required Braces or Orthoses:  (Limb guard in room) Restrictions Weight Bearing Restrictions Per Provider Order: Yes RLE Weight Bearing Per Provider Order: Weight bearing as tolerated LLE Weight Bearing Per Provider Order: Non weight bearing Other Position/Activity Restrictions: WBAT R foot  with post op shoe, NWB LLE with post op shoe     Mobility  Bed Mobility Overal bed mobility: Needs Assistance Bed Mobility: Supine to Sit     Supine to sit: Supervision, HOB elevated     General bed mobility comments: Supervision for safety, extra time.    Transfers Overall transfer level: Needs assistance Equipment used: Rolling walker (2 wheels) Transfers: Bed to chair/wheelchair/BSC, Sit to/from Stand Sit to Stand: From elevated surface, Min assist Stand pivot transfers: Min assist        Lateral/Scoot Transfers: Min assist General transfer comment: Min assist for boost to stand from elevated bed surface. Min assist for balance and RW control with pivot to recliner. Cues for technique, adequate UE support on RW to prevent buckling on Rt.    Ambulation/Gait Ambulation/Gait assistance: Min assist Gait Distance (Feet): 2 Feet Assistive device: Rolling walker (2 wheels) Gait Pattern/deviations:  (hop)     Pre-gait activities: Weight shift, lifting Rt foot from ground General Gait Details: Able to hop forward and backwards with min assist for RW control and balance. Anxious. Some posterior instability. Good UE support to clear Rt foot from ground (post op shoe in place)   Optometrist     Tilt Bed    Modified Rankin (Stroke Patients Only)       Balance Overall balance assessment: Needs assistance Sitting-balance support: No upper extremity supported, Feet supported Sitting balance-Leahy Scale: Good     Standing balance support: Reliant on assistive device for balance, Bilateral upper extremity supported, During functional activity Standing balance-Leahy Scale: Poor Standing balance comment: Up to min assist to steady  Communication Communication Communication: No apparent difficulties  Cognition Arousal: Alert Behavior During Therapy: WFL for tasks assessed/performed   PT - Cognitive  impairments: No apparent impairments                         Following commands: Intact      Cueing Cueing Techniques: Verbal cues, Gestural cues  Exercises Amputee Exercises Quad Sets: Strengthening, AROM, Left, 10 reps, Seated Hip Flexion/Marching: Strengthening, AROM, Left, 5 reps, Seated Knee Flexion: AROM, Left, 5 reps, Seated Other Exercises Other Exercises: Hamstring stretch LLE sitting EOB Other Exercises: Modified piriformis stretch with LLE adducted over R knee and forward trunk fold/hip flexion    General Comments General comments (skin integrity, edema, etc.): Educated on post-op precautions. knee extension at rest, desensitization techniques, limb protection      Pertinent Vitals/Pain Pain Assessment Pain Assessment: Faces Faces Pain Scale: Hurts even more Pain Location: LLE (Residual limb pain + Radicular pain from buttocks) Pain Descriptors / Indicators: Burning, Constant, Grimacing, Radiating Pain Intervention(s): Limited activity within patient's tolerance, Monitored during session, Repositioned    Home Living                          Prior Function            PT Goals (current goals can now be found in the care plan section) Acute Rehab PT Goals Patient Stated Goal: less pain PT Goal Formulation: With patient Time For Goal Achievement: 10/05/24 Potential to Achieve Goals: Good Progress towards PT goals: Progressing toward goals    Frequency    Min 3X/week      PT Plan      Co-evaluation              AM-PAC PT 6 Clicks Mobility   Outcome Measure  Help needed turning from your back to your side while in a flat bed without using bedrails?: A Little Help needed moving from lying on your back to sitting on the side of a flat bed without using bedrails?: A Little Help needed moving to and from a bed to a chair (including a wheelchair)?: A Little Help needed standing up from a chair using your arms (e.g., wheelchair  or bedside chair)?: A Little Help needed to walk in hospital room?: A Lot Help needed climbing 3-5 steps with a railing? : Total 6 Click Score: 15    End of Session Equipment Utilized During Treatment: Gait belt Activity Tolerance: Patient tolerated treatment well Patient left: with call bell/phone within reach;in chair;with chair alarm set Nurse Communication: Mobility status PT Visit Diagnosis: Unsteadiness on feet (R26.81);Other abnormalities of gait and mobility (R26.89);Muscle weakness (generalized) (M62.81);Difficulty in walking, not elsewhere classified (R26.2);Other symptoms and signs involving the nervous system (R29.898);Pain Pain - Right/Left: Left Pain - part of body: Leg     Time: 9265-9182 PT Time Calculation (min) (ACUTE ONLY): 43 min  Charges:    $Therapeutic Exercise: 8-22 mins $Therapeutic Activity: 23-37 mins PT General Charges $$ ACUTE PT VISIT: 1 Visit                     Leontine Roads, PT, DPT [facility]  Rehabilitation Services Physical Therapist Office: [phone] Website: [email or web address]    Leontine GORMAN Roads 09/23/2024, 11:23 AM

## 2024-09-23 NOTE — Progress Notes (Addendum)
 [facility] Progress Note   Subjective:   Pt seen in room. Upset this AM, reports she has a PD cath and they forgot to flush it yesterday, also feels like she is bothering staff with med requests. Provided reassurance. Still having a lot of pain but denies SOB, CP, dizziness, nausea.   Objective Vitals:   09/22/24 1502 09/22/24 1608 09/22/24 2005 09/23/24 0615  BP: (!) 142/71 130/64 122/68 134/67  Pulse: 82 81 76 79  Resp: 19 19    Temp: 98.8 F (37.1 C) 98.7 F (37.1 C) 98.9 F (37.2 C) 98.4 F (36.9 C)  TempSrc: Oral Oral Oral Oral  SpO2: 99% 99% 99% 97%  Weight:      Height:       Physical Exam General: Alert female in NAD Heart: RRR, no murmurs, rubs or gallops Lungs: CTA bilaterally, respirations unlabored Abdomen: Soft, non-distended, +BS Extremities: L BKA, R foot wrapped, no edema appreciated Dialysis Access: AVF + t/b, PD cath in abdomen    Additional Objective Labs: Basic Metabolic Panel: Recent Labs  Lab 09/20/24 0523 09/21/24 0410 09/22/24 0521  NA 134* 135 134*  K 5.9* 4.8 5.6*  CL 95* 94* 91*  CO2 25 26 22   GLUCOSE 138* 153* 118*  BUN 69* 41* 58*  CREATININE 9.54* 6.07* 7.83*  CALCIUM  8.4* 9.1 8.9  PHOS 6.9* 5.8* 7.3*   Liver Function Tests: Recent Labs  Lab 09/16/24 1747 09/17/24 0457 09/18/24 0327 09/21/24 0410 09/22/24 0521  AST 20 16 15   --   --   ALT 17 15 14   --   --   ALKPHOS 293* 255* 246*  --   --   BILITOT 0.8 0.9 0.6  --   --   PROT 7.5 7.3 7.2  --   --   ALBUMIN  2.6* 2.4* 2.3* 2.4* 2.5*   Recent Labs  Lab 09/16/24 1950  LIPASE 23   CBC: Recent Labs  Lab 09/16/24 1747 09/17/24 0457 09/18/24 0327 09/20/24 0523 09/21/24 0410 09/22/24 0521  WBC 19.4* 16.4* 12.1* 10.1 15.3* 11.6*  NEUTROABS 16.0*  --   --   --   --   --   HGB 8.4* 8.7* 8.9* 8.0* 8.9* 9.4*  HCT 26.8* 27.5* 29.6* 26.0* 28.7* 30.6*  MCV 95.0 94.5 96.7 96.7 95.3 94.7  PLT 511* 495* 572* 422* 499* 488*   Blood Culture    Component  Value Date/Time   SDES PERITONEAL FLUID 09/16/2024 2107   SPECREQUEST Immunocompromised 09/16/2024 2107   CULT  09/16/2024 2107    NO GROWTH 3 DAYS Performed at [facility], [address], [place], [place] [address]    REPTSTATUS 09/20/2024 FINAL 09/16/2024 2107    Cardiac Enzymes: No results for input(s): CKTOTAL, CKMB, CKMBINDEX, TROPONINI in the last 168 hours. CBG: Recent Labs  Lab 09/22/24 1500 09/22/24 1605 09/22/24 2007 09/23/24 0156 09/23/24 0859  GLUCAP 114* 131* 146* 138* 126*   Iron Studies: No results for input(s): IRON, TIBC, TRANSFERRIN, FERRITIN in the last 72 hours. @lablastinr3 @ Studies/Results: No results found. Medications:  ceFEPime (MAXIPIME) IV 1 g (09/22/24 2106)   [START ON 09/24/2024] vancomycin       (feeding supplement) PROSource Plus  30 mL Oral BID BM   aspirin  EC  81 mg Oral Daily   brimonidine  1 drop Both Eyes TID   cinacalcet   90 mg Oral Q breakfast   gabapentin   100 mg Oral QHS   gabapentin   200 mg Oral Daily  insulin  pump   Subcutaneous TID WC, HS, 0200   Lanthanum  Carbonate  2,000 mg Oral TID WC   metoprolol  tartrate  12.5 mg Oral 2 times per day on Sunday Tuesday Thursday Saturday   pantoprazole  40 mg Oral Daily   vancomycin  variable dose per unstable renal function (pharmacist dosing)   Does not apply See admin instructions    Dialysis Orders: MWF [facility] last week -->  4h  B400   90kg 2K bath  AVF  Heparin  none    Assessment/Plan: 1.  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 ESRD: Continue MWF schedule, next HD tomorrow. She has a PD cath but has not started PD yet, will ask HD RN to flush it today.  3. Anemia: Hgb 9.4-improving. Will continue to follow trend and decide on need for ESA redosing 4. CKD-MBD: Calcium  level variable, not on VDRA. Phos variable. Continue lanthanum . On  sensipar  for PTH 5. Nutrition: Continue renal diet with oral nutritional supplementation to hasten wound healing. Changed to renal/carb modified diet per her request so she can track her carbs, on insulin  pump.  6. Hypertension: Blood pressure currently controlled  Lucie Collet, PA-C 09/23/2024, 9:46 AM  [facility] Pager: [phone]

## 2024-09-24 ENCOUNTER — Other Ambulatory Visit (HOSPITAL_COMMUNITY): Payer: Self-pay

## 2024-09-24 DIAGNOSIS — A419 Sepsis, unspecified organism: Secondary | ICD-10-CM | POA: Diagnosis not present

## 2024-09-24 LAB — RENAL FUNCTION PANEL
Albumin: 2.2 g/dL — ABNORMAL LOW (ref 3.5–5.0)
Anion gap: 18 — ABNORMAL HIGH (ref 5–15)
BUN: 44 mg/dL — ABNORMAL HIGH (ref 6–20)
CO2: 23 mmol/L (ref 22–32)
Calcium: 8.2 mg/dL — ABNORMAL LOW (ref 8.9–10.3)
Chloride: 90 mmol/L — ABNORMAL LOW (ref 98–111)
Creatinine, Ser: 7.17 mg/dL — ABNORMAL HIGH (ref 0.44–1.00)
GFR, Estimated: 7 mL/min — ABNORMAL LOW (ref >60)
Glucose, Bld: 256 mg/dL — ABNORMAL HIGH (ref 70–99)
Phosphorus: 6.1 mg/dL — ABNORMAL HIGH (ref 2.5–4.6)
Potassium: 4.8 mmol/L (ref 3.5–5.1)
Sodium: 131 mmol/L — ABNORMAL LOW (ref 135–145)

## 2024-09-24 LAB — CBC
HCT: 23.8 % — ABNORMAL LOW (ref 36.0–46.0)
Hemoglobin: 7.4 g/dL — ABNORMAL LOW (ref 12.0–15.0)
MCH: 29.5 pg (ref 26.0–34.0)
MCHC: 31.1 g/dL (ref 30.0–36.0)
MCV: 94.8 fL (ref 80.0–100.0)
Platelets: 353 K/uL (ref 150–400)
RBC: 2.51 MIL/uL — ABNORMAL LOW (ref 3.87–5.11)
RDW: 13.5 % (ref 11.5–15.5)
WBC: 12.4 K/uL — ABNORMAL HIGH (ref 4.0–10.5)
nRBC: 0 % (ref 0.0–0.2)

## 2024-09-24 LAB — GLUCOSE, CAPILLARY
Glucose-Capillary: 97 mg/dL (ref 70–99)
Glucose-Capillary: 94 mg/dL (ref 70–99)
Glucose-Capillary: 114 mg/dL — ABNORMAL HIGH (ref 70–99)
Glucose-Capillary: 165 mg/dL — ABNORMAL HIGH (ref 70–99)
Glucose-Capillary: 166 mg/dL — ABNORMAL HIGH (ref 70–99)

## 2024-09-24 MED ORDER — PANTOPRAZOLE SODIUM 40 MG PO TBEC: 40.0000 mg | DELAYED_RELEASE_TABLET | Freq: Every day | ORAL | Status: AC

## 2024-09-24 MED ORDER — SENNA 8.6 MG PO TABS
1.0000 | ORAL_TABLET | Freq: Every day | ORAL | 0 refills | Status: DC
  Filled 2024-09-24: qty 120, 120d supply, fill #0

## 2024-09-24 MED ORDER — AMOXICILLIN-POT CLAVULANATE 875-125 MG PO TABS: 1.0000 | ORAL_TABLET | Freq: Two times a day (BID) | ORAL | Status: DC

## 2024-09-24 MED ORDER — AMOXICILLIN-POT CLAVULANATE 500-125 MG PO TABS
1.0000 | ORAL_TABLET | ORAL | Status: DC
  Administered 2024-09-24 – 2024-09-25 (×2): 1 via ORAL
  Filled 2024-09-24 (×3): qty 1

## 2024-09-24 MED ORDER — OXYCODONE HCL 10 MG PO TABS
10.0000 mg | ORAL_TABLET | ORAL | 0 refills | Status: DC | PRN
  Filled 2024-09-24: qty 30, 5d supply, fill #0

## 2024-09-24 MED ORDER — POLYETHYLENE GLYCOL 3350 17 GM/SCOOP PO POWD
17.0000 g | Freq: Every day | ORAL | 0 refills | Status: DC | PRN
  Filled 2024-09-24: qty 238, 14d supply, fill #0

## 2024-09-24 MED ORDER — OXYCODONE HCL 5 MG PO TABS
5.0000 mg | ORAL_TABLET | ORAL | 0 refills | Status: DC | PRN
  Filled 2024-09-24: qty 30, 5d supply, fill #0

## 2024-09-24 MED ORDER — INSULIN PUMP: 1.0000 | Freq: Three times a day (TID) | SUBCUTANEOUS | Status: DC

## 2024-09-24 MED ORDER — HYDROMORPHONE HCL 1 MG/ML IJ SOLN
INTRAMUSCULAR | Status: AC
  Filled 2024-09-24: qty 0.5

## 2024-09-24 MED ORDER — METHOCARBAMOL 750 MG PO TABS
750.0000 mg | ORAL_TABLET | Freq: Three times a day (TID) | ORAL | 0 refills | Status: DC | PRN
  Filled 2024-09-24: qty 40, 14d supply, fill #0

## 2024-09-24 NOTE — Progress Notes (Signed)
  Subjective:  Patient ID: Karen [name], female    DOB: 02-26-1976,  MRN: [id number]  Chief Complaint  Patient presents with   Post-op Problem    DOS: 09/10/2024 Procedure: 1. Amputation of right third toe at PIPJ level 2. Transmetatarsal amputation of left foot  48 y.o. female seen for post op check.  She is now s/p L BKA. She is 2 weeks out from the right foot partial 3rd toe amputation. Weightbearing in post op shoe RLE and denies pain on the right. Having a lot of pain on left side.    Review of Systems: Negative except as noted in the HPI. Denies N/V/F/Ch.   Objective:   Constitutional Well developed. Well nourished.  Vascular Foot warm and well perfused. Capillary refill normal to all digits.   No calf pain with palpation  Neurologic Normal speech. Oriented to person, place, and time. Epicritic sensation diminished to bilateral forefoot  Dermatologic Right foot amputation site at the third toe PIPJ level is doing very well healing well with no dehiscence and no drainage no erythema and no necrosis. Dorsal wound healing.     Orthopedic: Status post left foot transmetatarsal amputation, status post right partial third toe amputation   Radiographs: Right foot postop: 1. Interval surgical amputation of the third middle and distal phalanges. 2. Stable postsurgical changes involving the fifth metatarsal. 3. Stable chronic changes of the midfoot. 4. Vascular calcifications.  Left foot postop: 1. Status post amputation of all five toes at the mid metatarsal level. 2. Vascular calcifications.    Pathology: In process  Micro:   ABUNDANT MORGANELLA MORGANII ABUNDANT PROTEUS MIRABILIS    Assessment:   1. Sepsis, due to unspecified organism, unspecified whether acute organ dysfunction present (HCC)   2. 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  2 weeks  s/p left foot transmetatarsal amputation and partial right third toe amputation.  Now s/p L BKA with vascular.  - Right third toe continues to improve heal as expected. Recommend we do 2x weekly dressing changes with HH - this would include xerofrom, 4x4 gauze kerlix ace wrap. Monitor for dehiscence.   -XR: Expected postop changes -WB Status:Weightbearing as tolerated to right foot in postop shoe -Sutures: Removed today in total without issue  -Medications/ABX: no further abx required for right foot -Dressing: appreciate HH - 2x weekly dressing changes with above dsg. Ok to wash the right foot prn at this time. - F/u Plan: Patient will follow-up in the office in 2 weeks, office to call to arrange.         [name], DPM [facility] / [facility]

## 2024-09-24 NOTE — TOC Transition Note (Addendum)
 Transition of Care [facility]) - Discharge Note   Patient Details  Name: Karen [name] MRN: [id number] Date of Birth: [birth year]  [facility] CM/SW Contact:  Tom-Johnson, Harvest Muskrat, RN Phone Number: 09/24/2024, 11:14 AM   Clinical Narrative:     Patient is scheduled for discharge today.  Readmission Risk Assessment done. Home health info, Outpatient f/u, hospital f/u and discharge instructions on AVS. CM notified Burnard Gaba of patient's request for a RN to come to her home tomorrow. Prescriptions sent to [facility] pharmacy and patient will receive meds prior discharge. Daughter, Marland to transport at discharge.  No further ICM needs noted.      Final next level of care: Home w Home Health Services Barriers to Discharge: Barriers Resolved   Patient Goals and CMS Choice Patient states their goals for this hospitalization and ongoing recovery are:: To return home CMS Medicare.gov Compare Post Acute Care list provided to:: Patient Choice offered to / list presented to : Patient, Adult Children (Daughter, Marland)      Discharge Placement                       Discharge Plan and Services Additional resources added to the After Visit Summary for                  DME Arranged: Other see comment (Long sliding board) DME Agency: [employer] Date DME Agency Contacted: 09/23/24 Time DME Agency Contacted: [id number] Representative spoke with at DME Agency: Arthea            Social Drivers of Health (SDOH) Interventions SDOH Screenings   Food Insecurity: No Food Insecurity (09/17/2024)  Recent Concern: Food Insecurity - Food Insecurity Present (08/25/2024)   Received from [facility]  Housing: Low Risk  (09/17/2024)  Transportation Needs: No Transportation Needs (09/17/2024)  Utilities: Not At Risk (09/17/2024)  Financial Resource Strain: Medium Risk (07/28/2024)   Received from [facility]  Physical Activity: Inactive (07/28/2024)   Received  from [facility]  Social Connections: Socially Integrated (07/28/2024)   Received from [facility]  Stress: No Stress Concern Present (08/25/2024)   Received from [facility]  Tobacco Use: Medium Risk (09/20/2024)     Readmission Risk Interventions    09/17/2024    3:11 PM  Readmission Risk Prevention Plan  Transportation Screening Complete  PCP or Specialist Appt within 3-5 Days Complete  HRI or Home Care Consult Complete  Social Work Consult for Recovery Care Planning/Counseling Complete  Palliative Care Screening Not Applicable  Medication Review Oceanographer) Referral to Pharmacy

## 2024-09-24 NOTE — Progress Notes (Signed)
 Low grade temps in a setting of recent treatment for Osteo Will start Augmentin and reassess fever curve with labs in a,m  Cancel d/c for today  Reggie Grimes, MD [facility] 6:53 PM

## 2024-09-24 NOTE — Plan of Care (Signed)
 All un-completed outcomes for this patient resides here: (please see this RN's full progression notes regarding her shift events, assessments,and outcomes   Problem: Elimination: Goal: Will not experience complications related to bowel motility 09/24/2024 1057 by Breandan People K, RN Outcome: Progressing Note: [name] has struggled with bowel mobility recently. She requested and was place on a bowel regiment for this reason.  1 Problem: Pain Managment: Goal: General experience of comfort will improve and/or be controlled 09/24/2024 1057 by Zipporah Finamore K, RN Outcome: Not Met (add Reason) Note: [name] is still requiring a persistent schedule as it relates to pain medication. Though is adamant about not wanting to rely on pain medications, she verbalizes the need to utilize the medication as a tool to be capable of building up her activity intolerance.   Problem: Coping: Goal: Ability to adjust to condition or change in health will improve Outcome: Not Met (add Reason) Note: Alongside of her anxiety as it relates to her pain, [name] is concerned about her living arrangements, post discharge. She is unable to be placed into inpatient rehab at this time, therefore she will reside with her sister, mother and other family relatives. She is displeased with having to sleep on a recliner chair in her condition. Much reassuring and therapeutic communication was completed by this RN with [name].    Problem: Elimination: Goal: Will not experience complications related to urinary retention Outcome: Not Applicable

## 2024-09-24 NOTE — Progress Notes (Signed)
   09/24/24 1344  Vitals  Temp 99.2 F (37.3 C)  Pulse Rate 85  Resp 11  BP (!) 147/53  SpO2 93 %  O2 Device Room Air  Weight 83 kg  Type of Weight Post-Dialysis  Oxygen Therapy  Patient Activity (if Appropriate) In bed  Pulse Oximetry Type Continuous  Oximetry Probe Site Changed No  Post Treatment  Dialyzer Clearance Lightly streaked  Hemodialysis Intake (mL) 0 mL  Liters Processed 84  Fluid Removed (mL) 2000 mL  Tolerated HD Treatment Yes  AVG/AVF Arterial Site Held (minutes) 5 minutes  AVG/AVF Venous Site Held (minutes) 5 minutes   Received patient in bed to unit.  Alert and oriented.  Informed consent signed and in chart.   TX duration:3.5  Patient tolerated well.  Transported back to the room  Alert, without acute distress.  Hand-off given to patient's nurse.   Access used: LUAF Access issues: no complications  Total UF removed: 2000 Medication(s) given: dilaudid  iv at 1131  Pts PD catheter flushed with 1.5% PD fluid pere sterile protocol and technique---1 liter flush---effluent is clearn light yellow---DSD change to the PD acces per protocol---no s/s of any reddness or drainage at the exit site---  [name]

## 2024-09-24 NOTE — Progress Notes (Signed)
 PT Cancellation Note  Patient Details Name: Karen [name] MRN: [id number] DOB: 1976-01-15   Cancelled Treatment:    Reason Eval/Treat Not Completed: Patient at procedure or test/unavailable (HD)  Aleck Daring, PT, DPT Acute Rehabilitation Services Office [phone]    [name] 09/24/2024, 9:10 AM

## 2024-09-24 NOTE — Discharge Summary (Signed)
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  Comment  +---------+------------------+-----+----------+--------+ Brachial 195                    triphasic          +---------+------------------+-----+----------+--------+ PTA      239               1.23 monophasic         +---------+------------------+-----+----------+--------+ DP       254               1.30 biphasic           +---------+------------------+-----+----------+--------+ Great Toe121               0.62 Abnormal           +---------+------------------+-----+----------+--------+ +---------+------------------+-----+----------+-------+ Left     Lt Pressure (mmHg)IndexWaveform  Comment +---------+------------------+-----+----------+-------+ Brachial  HD      +---------+------------------+-----+----------+-------+ PTA      254               1.30 monophasic        +---------+------------------+-----+----------+-------+ DP        254               1.30 biphasic          +---------+------------------+-----+----------+-------+ Great Toe52                0.27 Abnormal          +---------+------------------+-----+----------+-------+ +-------+-----------+-----------+------------+------------+ ABI/TBIToday's ABIToday's TBIPrevious ABIPrevious TBI +-------+-----------+-----------+------------+------------+ Right  [place]         0.62       1.26        0.56         +-------+-----------+-----------+------------+------------+ Left   [place]         0.27       1.29        0.93         +-------+-----------+-----------+------------+------------+  Waveforms seen better on duplex images.  Summary: Right: Resting right ankle-brachial index indicates noncompressible right lower extremity arteries. The right toe-brachial index is abnormal.  Left: Resting left ankle-brachial index indicates noncompressible left lower extremity arteries. The left toe-brachial index is abnormal.  *See table(s) above for measurements and observations.  Electronically signed by Debby Robertson on 09/07/2024 at 4:56:42 PM.    Final    MR FOOT LEFT WO CONTRAST Result Date: 09/06/2024 CLINICAL DATA:  Open wound and necrotic appearing second toe. EXAM: MRI OF THE LEFT FOOT WITHOUT CONTRAST TECHNIQUE: Multiplanar, multisequence MR imaging of the left foot was performed. No intravenous contrast was administered. COMPARISON:  Radiographs, same date. FINDINGS: Large open wound involving medial and dorsal aspect of great toe. Surrounding changes cellulitis with skin thickening subcutaneous edema. Abnormal T1 and T2 signal intensity in the first metacarpal head, proximal and distal phalanges consistent with osteomyelitis. Do not see any definite findings for septic arthritis. No obvious involvement of the sesamoid bones. Tissue loss involving the second toe with gas in the soft tissues suspected. Abnormal signal intensity in proximal middle and distal phalanges suggesting  osteomyelitis. Abnormal T2 signal intensity in the distal phalanx of third toe is also worrisome for osteomyelitis. T2 signal abnormality distal aspect of the proximal phalanx of the fifth toe is also worrisome for osteomyelitis. No discrete drainable soft tissue abscess or findings for pyomyositis. Advanced fatty atrophy of foot musculature. IMPRESSION: 1. Multiple sites of osteomyelitis involving the forefoot as above. 2. No discrete drainable soft tissue abscess or findings for pyomyositis. Electronically Signed   By: MYRTIS Stammer M.D.   On: 09/06/2024 22:13   DG Foot Complete Left Result Date: 09/06/2024 Please see detailed radiograph report in office note.  DG Foot Complete Right Result Date: 09/06/2024 Please see detailed radiograph report in office note.   Microbiology: Recent Results (from the past 240 hours)  Culture, blood (Routine x 2)     Status: None   Collection Time: 09/16/24  5:10 PM   Specimen: BLOOD RIGHT ARM  Result Value Ref Range Status   Specimen Description BLOOD RIGHT ARM  Final   Special Requests   Final    BOTTLES DRAWN AEROBIC AND ANAEROBIC Blood Culture results may not be optimal due to an inadequate volume of blood received in culture bottles   Culture   Final    NO GROWTH 5 DAYS Performed at [facility],  [address], [place], [place] [address]    Report Status 09/21/2024 FINAL  Final  Culture, blood (Routine x 2)     Status: None   Collection Time: 09/16/24  5:42 PM   Specimen: BLOOD RIGHT ARM  Result Value Ref Range Status   Specimen Description BLOOD RIGHT ARM  Final   Special Requests   Final    BOTTLES DRAWN AEROBIC AND ANAEROBIC Blood Culture results may not be optimal due to an inadequate volume of blood received in culture bottles   Culture   Final    NO GROWTH 5 DAYS Performed at [facility], [address], [place], [place] [address]    Report Status 09/21/2024 FINAL  Final  Body fluid culture w Gram Stain     Status: None    Collection Time: 09/16/24  9:07 PM   Specimen: Peritoneal Washings; Body Fluid  Result Value Ref Range Status   Specimen Description PERITONEAL FLUID  Final   Special Requests Immunocompromised  Final   Gram Stain NO ORGANISMS SEEN NO WBC SEEN CYTOSPIN SMEAR   Final   Culture   Final    NO GROWTH 3 DAYS Performed at [facility], [address] [place], [place] [address]    Report Status 09/20/2024 FINAL  Final  MRSA Next Gen by PCR, Nasal     Status: None   Collection Time: 09/17/24  4:48 AM   Specimen: Nasal Mucosa; Nasal Swab  Result Value Ref Range Status   MRSA by PCR Next Gen NOT DETECTED NOT DETECTED Final    Comment: (NOTE) The GeneXpert MRSA Assay (FDA approved for NASAL specimens only), is one component of a comprehensive MRSA colonization surveillance program. It is not intended to diagnose MRSA infection nor to guide or monitor treatment for MRSA infections. Test performance is not FDA approved in patients less than 12 years old. Performed at [facility], [address], [place], [place] [address]      Labs: Basic Metabolic Panel: Recent Labs  Lab 09/18/24 0327 09/20/24 0523 09/21/24 0410 09/22/24 0521  NA 137 134* 135 134*  K 4.4 5.9* 4.8 5.6*  CL 94* 95* 94* 91*  CO2 27 25 26 22   GLUCOSE 163* 138* 153* 118*  BUN 33* 69* 41* 58*  CREATININE 5.42* 9.54* 6.07* 7.83*  CALCIUM  8.3* 8.4* 9.1 8.9  PHOS  --  6.9* 5.8* 7.3*   Liver Function Tests: Recent Labs  Lab 09/18/24 0327 09/21/24 0410 09/22/24 0521  AST 15  --   --   ALT 14  --   --   ALKPHOS 246*  --   --   BILITOT 0.6  --   --   PROT 7.2  --   --   ALBUMIN  2.3* 2.4* 2.5*   No results for input(s): LIPASE, AMYLASE in the last 168 hours. No results for input(s): AMMONIA in the last 168 hours. CBC: Recent Labs  Lab 09/18/24 0327 09/20/24 0523 09/21/24 0410 09/22/24 0521 09/24/24 0955  WBC 12.1* 10.1 15.3* 11.6* 12.4*  HGB 8.9* 8.0* 8.9* 9.4* 7.4*  HCT 29.6* 26.0*  28.7* 30.6* 23.8*  MCV 96.7 96.7 95.3 94.7 94.8  PLT 572* 422* 499* 488* 353   Cardiac Enzymes: No results for input(s): CKTOTAL, CKMB, CKMBINDEX, TROPONINI in the last 168 hours. BNP: BNP (last 3 results) No results for input(s): BNP in the last 8760 hours.  ProBNP (last 3 results) No results for input(s): PROBNP in the last  8760 hours.  CBG: Recent Labs  Lab 09/23/24 1219 09/23/24 1809 09/23/24 2144 09/24/24 0259 09/24/24 0721  GLUCAP 108* 148* 140* 97 94    Signed:  Colen Grimes MD   [facility] 09/24/2024, 10:18 AM

## 2024-09-24 NOTE — Procedures (Signed)
 Patient seen on Hemodialysis. BP (!) 153/79   Pulse 79   Temp 99.2 F (37.3 C)   Resp 11   Ht 5' 5 (1.651 m)   Wt 85 kg   SpO2 95%   BMI 31.18 kg/m   QB 400, UF goal 2L Tolerating treatment without complaints at this time.   Gordy Blanch MD [facility] Office # [phone] Pager # [phone] 10:46 AM

## 2024-09-24 NOTE — Progress Notes (Signed)
 D/c orders noted. Contacted out-pt HD clinic [facility], to inform of pt d/c and anticipated arrival back on Monday, have faxed over d/c summary and last nephrology note at this time.   Also contacted [facility], where pt gets her PD flushed, spoke with home therapies department and informed of pt d/c from [facility] at this time.   No further support needed.   Lavanda Kresha Abelson Dialysis Navigator [phone]

## 2024-09-24 NOTE — Progress Notes (Signed)
 0800- Pharmacy asking if patient requires binder for breakfast, HD calling about refused transportation for patient, This to bedside at 0906: patient states she is okay to go to HD if she gets her pain medications. Patient states displeasure about not receiving her PRN pain medications early this morning. States she requested pain medication on multiple occasions without support. Oxycodone  given and patient requesting Robaxin at this time as well  1000- Patient off of unit for HD 1200- Patient off of unit for HD 1400- HDRN report to this RN [phone]: removed 2L, Dilaudid , PD catheter flushed and dressing changed, 99.39F oral 1600- Patient back on unit, low grade temperature documented, Dr. Royal informed and to bedside. Patient's LBKA undressed and Dr. Royal to assess 1800- This nurse to patient's bedside, patient's states daughter is on her way and she is ready to have PIVs removed, bath and ready to be transported out. PIVs removed and vitals taken. Patient has low grade fever: 100.2, Dr. Royal states patient is to stay for additional night, receive tylenol  and oral antibiotics, blood cultures, observation in the morning, MRI of extremity.  Patient aware and agrees with plan, IV team consulted as patient is hard stick and requires IV access and blood cultures. This RN attempted IV insertion and blood draw x1 unsuccessful attempt to RFA. Patient is unsystematic, and rest of vitals remain baseline for patient.   Caron, RN receives patient and states no further questions concerning acute change and additional plan of care

## 2024-09-24 NOTE — Progress Notes (Signed)
 PHARMACY NOTE:  ANTIMICROBIAL RENAL DOSAGE ADJUSTMENT  Current antimicrobial regimen includes a mismatch between antimicrobial dosage and estimated renal function.  As per policy approved by the Pharmacy & Therapeutics and Medical Executive Committees, the antimicrobial dosage will be adjusted accordingly.  Current antimicrobial dosage:  Augmentin 875mg  BID  Indication: fevers  Renal Function:  Estimated Creatinine Clearance: 10.2 mL/min (A) (by C-G formula based on SCr of 7.17 mg/dL (H)). [x]      On intermittent HD, scheduled: []      On CRRT    Antimicrobial dosage has been changed to:  Augmentin 500mg  q24h  Additional comments:   Thank you for allowing pharmacy to be a part of this patient's care.  Ozell ONEIDA [place], [facility] 09/24/2024 6:57 PM

## 2024-09-24 NOTE — Plan of Care (Signed)
   Problem: Health Behavior/Discharge Planning: Goal: Ability to manage health-related needs will improve Outcome: Completed/Met

## 2024-09-25 ENCOUNTER — Inpatient Hospital Stay (HOSPITAL_COMMUNITY)

## 2024-09-25 DIAGNOSIS — E11628 Type 2 diabetes mellitus with other skin complications: Secondary | ICD-10-CM | POA: Diagnosis not present

## 2024-09-25 DIAGNOSIS — I96 Gangrene, not elsewhere classified: Secondary | ICD-10-CM | POA: Diagnosis not present

## 2024-09-25 DIAGNOSIS — A419 Sepsis, unspecified organism: Secondary | ICD-10-CM | POA: Diagnosis not present

## 2024-09-25 DIAGNOSIS — L089 Local infection of the skin and subcutaneous tissue, unspecified: Secondary | ICD-10-CM | POA: Diagnosis not present

## 2024-09-25 LAB — CBC WITH DIFFERENTIAL/PLATELET
Abs Immature Granulocytes: 0.07 K/uL (ref 0.00–0.07)
Basophils Absolute: 0.1 K/uL (ref 0.0–0.1)
Basophils Relative: 1 %
Eosinophils Absolute: 0.2 K/uL (ref 0.0–0.5)
Eosinophils Relative: 1 %
HCT: 24.5 % — ABNORMAL LOW (ref 36.0–46.0)
Hemoglobin: 7.8 g/dL — ABNORMAL LOW (ref 12.0–15.0)
Immature Granulocytes: 1 %
Lymphocytes Relative: 17 %
Lymphs Abs: 2.4 K/uL (ref 0.7–4.0)
MCH: 30.1 pg (ref 26.0–34.0)
MCHC: 31.8 g/dL (ref 30.0–36.0)
MCV: 94.6 fL (ref 80.0–100.0)
Monocytes Absolute: 1.5 K/uL — ABNORMAL HIGH (ref 0.1–1.0)
Monocytes Relative: 10 %
Neutro Abs: 10.6 K/uL — ABNORMAL HIGH (ref 1.7–7.7)
Neutrophils Relative %: 70 %
Platelets: 354 K/uL (ref 150–400)
RBC: 2.59 MIL/uL — ABNORMAL LOW (ref 3.87–5.11)
RDW: 13.5 % (ref 11.5–15.5)
WBC: 14.8 K/uL — ABNORMAL HIGH (ref 4.0–10.5)
nRBC: 0 % (ref 0.0–0.2)

## 2024-09-25 LAB — BASIC METABOLIC PANEL WITH GFR
Anion gap: 14 (ref 5–15)
BUN: 28 mg/dL — ABNORMAL HIGH (ref 6–20)
CO2: 27 mmol/L (ref 22–32)
Calcium: 8 mg/dL — ABNORMAL LOW (ref 8.9–10.3)
Chloride: 89 mmol/L — ABNORMAL LOW (ref 98–111)
Creatinine, Ser: 4.89 mg/dL — ABNORMAL HIGH (ref 0.44–1.00)
GFR, Estimated: 10 mL/min — ABNORMAL LOW (ref >60)
Glucose, Bld: 168 mg/dL — ABNORMAL HIGH (ref 70–99)
Potassium: 4.2 mmol/L (ref 3.5–5.1)
Sodium: 130 mmol/L — ABNORMAL LOW (ref 135–145)

## 2024-09-25 LAB — GLUCOSE, CAPILLARY
Glucose-Capillary: 156 mg/dL — ABNORMAL HIGH (ref 70–99)
Glucose-Capillary: 142 mg/dL — ABNORMAL HIGH (ref 70–99)
Glucose-Capillary: 133 mg/dL — ABNORMAL HIGH (ref 70–99)
Glucose-Capillary: 61 mg/dL — ABNORMAL LOW (ref 70–99)
Glucose-Capillary: 221 mg/dL — ABNORMAL HIGH (ref 70–99)
Glucose-Capillary: 74 mg/dL (ref 70–99)
Glucose-Capillary: 233 mg/dL — ABNORMAL HIGH (ref 70–99)
Glucose-Capillary: 211 mg/dL — ABNORMAL HIGH (ref 70–99)

## 2024-09-25 MED ORDER — OXYCODONE HCL 5 MG PO TABS
5.0000 mg | ORAL_TABLET | ORAL | Status: DC | PRN
  Filled 2024-09-25 (×5): qty 1

## 2024-09-25 MED ORDER — OXYCODONE HCL 5 MG PO TABS
10.0000 mg | ORAL_TABLET | ORAL | Status: DC | PRN
  Administered 2024-09-25 – 2024-09-29 (×21): 10 mg via ORAL
  Filled 2024-09-25 (×16): qty 2

## 2024-09-25 MED ORDER — GADOBUTROL 1 MMOL/ML IV SOLN
8.0000 mL | Freq: Once | INTRAVENOUS | Status: AC | PRN
  Administered 2024-09-25: 8 mL via INTRAVENOUS

## 2024-09-25 MED ORDER — SORBITOL 70 % SOLN
30.0000 mL | Freq: Every day | Status: AC
  Administered 2024-09-26: 30 mL via ORAL
  Filled 2024-09-25 (×2): qty 30

## 2024-09-25 NOTE — Progress Notes (Signed)
 [facility] Progress Note   Subjective:    Noted she was supposed to be discharged last night. She reports she developed a fever overnight. Seen in room. She just vomited and received PRN Phenergan. Tolerated yesterday's HD with net UF 2L. She reports her PD catheter was flushed yesterday as well.  Objective Vitals:   09/24/24 1711 09/24/24 1951 09/25/24 0411 09/25/24 0751  BP: (!) 156/63 (!) 154/64 112/64 (!) 190/90  Pulse: 87 88 79 86  Resp: 12 18 18 19   Temp: 99.4 F (37.4 C) 99.2 F (37.3 C) 100 F (37.8 C) 99.2 F (37.3 C)  TempSrc: Oral     SpO2: 98%   100%  Weight:      Height:       Physical Exam General: Alert female in NAD, on O2 Heart: RRR, no murmurs, rubs or gallops Lungs: CTA bilaterally, respirations unlabored Abdomen: Soft, non-distended, +BS Extremities: L BKA, R foot wrapped, no edema appreciated Dialysis Access: AVF + t/b, PD cath in abdomen  Filed Weights   09/22/24 0903 09/22/24 1352 09/24/24 1344  Weight: 87 kg 85 kg 83 kg    Intake/Output Summary (Last 24 hours) at 09/25/2024 1031 Last data filed at 09/25/2024 0926 Gross per 24 hour  Intake 0 ml  Output 2050 ml  Net -2050 ml    Additional Objective Labs: Basic Metabolic Panel: Recent Labs  Lab 09/21/24 0410 09/22/24 0521 09/24/24 0955 09/25/24 0519  NA 135 134* 131* 130*  K 4.8 5.6* 4.8 4.2  CL 94* 91* 90* 89*  CO2 26 22 23 27   GLUCOSE 153* 118* 256* 168*  BUN 41* 58* 44* 28*  CREATININE 6.07* 7.83* 7.17* 4.89*  CALCIUM  9.1 8.9 8.2* 8.0*  PHOS 5.8* 7.3* 6.1*  --    Liver Function Tests: Recent Labs  Lab 09/21/24 0410 09/22/24 0521 09/24/24 0955  ALBUMIN  2.4* 2.5* 2.2*   No results for input(s): LIPASE, AMYLASE in the last 168 hours. CBC: Recent Labs  Lab 09/20/24 0523 09/21/24 0410 09/22/24 0521 09/24/24 0955 09/25/24 0519  WBC 10.1 15.3* 11.6* 12.4* 14.8*  NEUTROABS  --   --   --   --  10.6*  HGB 8.0* 8.9* 9.4* 7.4* 7.8*  HCT 26.0* 28.7*  30.6* 23.8* 24.5*  MCV 96.7 95.3 94.7 94.8 94.6  PLT 422* 499* 488* 353 354   Blood Culture    Component Value Date/Time   SDES BLOOD RIGHT ARM 09/25/2024 0522   SPECREQUEST  09/25/2024 0522    BOTTLES DRAWN AEROBIC AND ANAEROBIC Blood Culture adequate volume   CULT  09/25/2024 0522    NO GROWTH < 12 HOURS Performed at [facility], [address], [place], [place] [address]    REPTSTATUS PENDING 09/25/2024 0522    Cardiac Enzymes: No results for input(s): CKTOTAL, CKMB, CKMBINDEX, TROPONINI in the last 168 hours. CBG: Recent Labs  Lab 09/24/24 1953 09/25/24 0321 09/25/24 0652 09/25/24 0753 09/25/24 1007  GLUCAP 166* 156* 142* 133* 61*   Iron Studies: No results for input(s): IRON, TIBC, TRANSFERRIN, FERRITIN in the last 72 hours. Lab Results  Component Value Date   INR 1.1 09/16/2024   INR 1.1 09/06/2024   Studies/Results: MR FOOT RIGHT W WO CONTRAST Result Date: 09/25/2024 EXAM: MRI of the right Foot without and with contrast. 09/25/2024 05:56:29 AM TECHNIQUE: Multiplanar multisequence MRI of the right foot was performed without and with the administration of 8 mL gadobutrol (GADAVIST) 1 MMOL/ML injection. COMPARISON: Radiographs 09/16/2024 and MRI 09/07/2024.  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 BONE MARROW: Again seen are chronic postoperative changes from fifth-grade amputation at the level of the midshaft of the metatarsal bone. Postsurgical changes from 3rd grade amputation at the level of the PIP joint. On the T1 weighted sequences, there is abnormality low T1 signal within the head of the 3rd proximal phalanx with corresponding increased T2 signal. Coronal on the postcontrast images there is associated enhancement involving the head of the third proximal phalanx (axial image 14/10). Report image 13/7 and  coronal image 13/6. There is fluid signal intensity surrounding the ray toe from the MTP joint through the distal aspect of the distal phalanx. There is no T1 signal abnormality within the bone to suggest osteomyelitis. 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 No aggressive marrow replacing lesion. GREATER AND LESSER MTP JOINTS: Postsurgical changes from 3rd ray amputation at the level of the PIP joint. On the T1 weighted sequences, there is abnormality low T1 signal within the head of the 3rd proximal phalanx with corresponding increased T2 signal. On the postcontrast images there is associated enhancement involving the head of the third proximal phalanx (axial image 14/10). There is fluid signal intensity surrounding the great toe from the MTP joint through the distal aspect of the distal phalanx. There is no T1 signal abnormality within the bone to suggest osteomyelitis. 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 SOFT TISSUES: Mild diffuse soft tissue edema about the foot. No discrete fluid collections identified to suggest abscess. TENDONS: Visualized flexor and extensor tendons are intact without tenosynovitis. IMPRESSION: 1. 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 2. No discrete drainable abscess identified. Electronically signed by: Taylor Stroud MD 09/25/2024 09:56 AM EDT RP Workstation: [id number]    Medications:   (feeding supplement) PROSource Plus  30 mL Oral BID BM   amoxicillin-clavulanate  1 tablet Oral Q24H   aspirin  EC  81 mg Oral Daily   brimonidine  1 drop Both Eyes TID   cinacalcet   90 mg Oral Q breakfast   gabapentin   100 mg Oral QHS   gabapentin   200 mg Oral Daily   insulin  pump   Subcutaneous TID WC, HS, 0200   Lanthanum  Carbonate  2,000 mg  Oral TID WC   metoprolol  tartrate  12.5 mg Oral 2 times per day on Sunday Tuesday Thursday Saturday   pantoprazole  40 mg Oral Daily   senna  1 tablet Oral Daily   sorbitol  30 mL Oral Daily    Dialysis Orders: MWF [facility] last week -->  4h  B400   90kg 2K bath  AVF  Heparin  none  Assessment/Plan: 1.  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 ESRD: Continue MWF schedule, next HD 10/27. She has a PD cath but has not started PD yet, PD cath flushed 10/24.  3. Anemia: Hgb 9.4-improving. Will continue to follow trend and decide on need for ESA redosing 4. CKD-MBD: Calcium  level variable, not on VDRA. Phos variable. Continue lanthanum . On sensipar  for PTH 5. Nutrition: Continue renal diet with oral nutritional supplementation to hasten wound healing. Changed to renal/carb modified diet per her request so she  can track her carbs, on insulin  pump.  6. Hypertension: Blood pressure currently controlled  Charmaine Piety, NP  [facility] 09/25/2024,10:31 AM  LOS: 9 days

## 2024-09-25 NOTE — Progress Notes (Signed)
 PROGRESS NOTE   Karen [name]  FMW:[id number]    DOB: 03/18/1976    DOA: 09/16/2024  PCP: Lesa Jon HERO, PA   I have briefly reviewed patients previous medical records in [facility].   Brief Hospital Course:  48 y.o. 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 Admit 10/6-10/13 left foot transmetatarsal amputation and right foot third toe amputation on September 10, 2024 and also underwent angiogram with [facility] angioplasty of the left popliteal artery  10/16 re-admit weakness fever chills last 2-3 days--noted discolored transmetatarsal flap.  Patient was started on empiric antibiotics.  10/8 angiography second-order cannulation L popliteal artery drug-coated balloon angioplasty 4X 60 cm-Dr. 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 PAD Ceft/vanc flagyll d/c 10/23 after 7 days total---  discharge canceled however on 10/24 because continued to have low-grade temps up to 100.3- I resumed antibiotics 10/24 and placed her on Augmentin 500 every 24 to go with her renal function MRI of the right foot shows osteomyelitis head of third proximal phalanx no discrete drainable abscess Podiatry to comment further and it looks like they want to do an x-ray and make decision plan subsequently Pain control Oxy IR 5 every 4 as needed moderate, Oxy 10 every 4 as needed severe with breakthrough Dilaudid  0.5 Q2 as needed Careful dosing gabapentin  100 at bedtime and 200 daily-careful dosing Robaxin 500 every 6 as needed  Nausea and vomiting Had episodic  vomiting with Rena-Vite--resolved Treat supportively, added PPI.  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 Using insulin  pump-CBG relatively controlled in the 70-200 range   ESRD on MWF HD Hyperkalemia Has Peritoneal Access which is not yet mature Nephrology following for HD needs.  Last HD 10/20.  Hyperkalemia resolved. Continue Fosrenol  1000 as needed snack, phos renal 2000 3 times daily meal, Sensipar  90 daily,  Right hip pain ? twisted her hip at home.  X-rays right hip and lumbar spine without acute findings. Pain meds as above-requested PT evaluation-awaiting inpatient rehab input otherwise wants to discharge home  Essential hypertension Controlled--cont metoprolol  to tartrate 12.5 mg twice daily on Sunday Tuesday Thursday Saturday  Anemia of ESRD Stable.  Trend CBC.  S/p right third toe amputation See above discussion  Body mass index is 30.45 kg/m./Class I obesity Complicates care.   DVT prophylaxis: SCD's Start: 09/20/24 1016    Code Status: Full Code:  Family Communication: None at bedside. Disposition:  Status is: Inpatient   Unclear at this time   Consultants:   Podiatry Vascular surgery Nephrology  Procedures:   As above  Subjective:   Worried about next steps-promised her we would try to get her some answers  Objective:   Vitals:   09/24/24 1951 09/25/24 0411 09/25/24 0751 09/25/24 1113  BP: (!) 154/64 112/64 (!) 190/90 126/75  Pulse: 88 79 86 75  Resp: 18 18 19    Temp: 99.2 F (37.3 C) 100 F (37.8 C) 99.2 F (37.3 C)   TempSrc:      SpO2:   100%   Weight:      Height:  Awake coherent no distress EOMI NCAT no focal deficit no icterus no pallor S1-S2 no murmur nsr Chest is clear Abdomen soft no rebound no guarding Wounds not examined today  Data Reviewed:   I have personally reviewed following labs and imaging studies   CBC: Recent Labs  Lab 09/22/24 0521  09/24/24 0955 09/25/24 0519  WBC 11.6* 12.4* 14.8*  NEUTROABS  --   --  10.6*  HGB 9.4* 7.4* 7.8*  HCT 30.6* 23.8* 24.5*  MCV 94.7 94.8 94.6  PLT 488* 353 354    Basic Metabolic Panel: Recent Labs  Lab 09/20/24 0523 09/21/24 0410 09/22/24 0521 09/24/24 0955 09/25/24 0519  NA 134* 135 134* 131* 130*  K 5.9* 4.8 5.6* 4.8 4.2  CL 95* 94* 91* 90* 89*  CO2 25 26 22 23 27   GLUCOSE 138* 153* 118* 256* 168*  BUN 69* 41* 58* 44* 28*  CREATININE 9.54* 6.07* 7.83* 7.17* 4.89*  CALCIUM  8.4* 9.1 8.9 8.2* 8.0*  PHOS 6.9* 5.8* 7.3* 6.1*  --     Liver Function Tests: Recent Labs  Lab 09/21/24 0410 09/22/24 0521 09/24/24 0955  ALBUMIN  2.4* 2.5* 2.2*    CBG: Recent Labs  Lab 09/25/24 1007 09/25/24 1030 09/25/24 1128  GLUCAP 61* 74 221*       Scheduled Meds:    (feeding supplement) PROSource Plus  30 mL Oral BID BM   amoxicillin-clavulanate  1 tablet Oral Q24H   aspirin  EC  81 mg Oral Daily   brimonidine  1 drop Both Eyes TID   cinacalcet   90 mg Oral Q breakfast   gabapentin   100 mg Oral QHS   gabapentin   200 mg Oral Daily   insulin  pump   Subcutaneous TID WC, HS, 0200   Lanthanum  Carbonate  2,000 mg Oral TID WC   metoprolol  tartrate  12.5 mg Oral 2 times per day on Sunday Tuesday Thursday Saturday   pantoprazole  40 mg Oral Daily   senna  1 tablet Oral Daily   sorbitol  30 mL Oral Daily      LOS: 9 days   Jai-Gurmukh Sparsh Callens, MD,   To contact the attending provider between 7A-7P or the covering provider during after hours 7P-7A, please log into the web site [email or web address] and access using universal [place] password for that web site. If you do not have the password, please call the hospital operator.  09/25/2024, 2:17 PM

## 2024-09-25 NOTE — Progress Notes (Signed)
  Subjective:  Patient ID: Karen [name], female    DOB: 1976/07/06,  MRN: [id number]  Chief Complaint  Patient presents with   Post-op Problem    DOS: 09/10/2024 Procedure: 1. Amputation of right third toe at PIPJ level 2. Transmetatarsal amputation of left foot  48 y.o. female seen for post op check.  She is now s/p L BKA. She is 2 weeks out from the right foot partial 3rd toe amputation. Weightbearing in post op shoe RLE and denies pain on the right. She continues to have pain at the below knee amputation site. She denies subjective fever however has been running low grade temperature overnight. Also reports an episode of emesis.  Review of Systems: Negative except as noted in the HPI.    Objective:   Constitutional Well developed. Well nourished.  Vascular Foot warm and well perfused. Capillary refill normal to all digits.   No calf pain with palpation  Neurologic Normal speech. Oriented to person, place, and time. Epicritic sensation diminished to bilateral forefoot  Dermatologic Right foot amputation site at the third toe PIPJ level appears to be healing well, stiches previously removed, no drainage, no gapping, skin edges well approximated and well coapted. Dorsal wound healing, wound margins appear to be contracting.  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 Left side below knee amputation.   Radiographs: Right foot postop: 1. Interval surgical amputation of the third middle and distal phalanges. 2. Stable postsurgical changes involving the fifth metatarsal. 3. Stable chronic changes of the midfoot. 4. Vascular calcifications.    IMPRESSION: 1. 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 2. No discrete drainable abscess identified.   Electronically signed by: Waddell Calk MD 09/25/2024 09:56 AM EDT RP Workstation:  [id number]  Pathology: Right third toe showed necrotizing inflammation with calcific atherosclerosis, disarticulation margin was negative for osteomyelitis  Micro:   ABUNDANT MORGANELLA MORGANII ABUNDANT PROTEUS MIRABILIS    Assessment:   1. Sepsis, due to unspecified organism, unspecified whether acute organ dysfunction present (HCC)   2. 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  2 weeks  s/p left foot transmetatarsal amputation and partial right third toe amputation.  Now s/p L BKA with vascular.  - Clinically, right third toe continues to improve and appears to be doing well.  Dorsal right foot wound also appears to be healing well with contracting wound margins -Reapplied Xeroform, 4 x 4 gauze, Kerlix, Ace wrap bandage today. -Continue 2 times weekly dressing changes with home health for the dorsal foot wound and monitor for signs of dehiscence to the toe. -MRI: Reviewed MRI with reading radiologist.  Recommend that we obtain a updated right foot x-ray to see if there is evidence of any cortical destruction of the remaining third toe -WB Status:Weightbearing as tolerated to right foot in postop shoe -Medications/ABX: Has started course of Augmentin due to low-grade fever. Previously finished IV ABX 10/23 -Dressing: appreciate HH - 2x weekly dressing changes with above dsg. Ok to wash the right foot prn at this time. - F/u Plan: Awaiting further workup        Tico Crotteau L. Lamount DPM [facility] / [facility]

## 2024-09-25 NOTE — Progress Notes (Signed)
 Physical Therapy Treatment  Patient Details Name: Karen [name] MRN: [id number] DOB: [birth year] Today's Date: 09/25/2024   History of Present Illness Karen [name] is a 48 y.o. female admitted on 09/16/24 with infected left diabetic foot wound. 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    PT Comments  Pt progressing towards physical therapy goals. Was able to perform transfers with both slide board and RW. Pt instructed on slide board use to get to/from her wheelchair at home. Pt practiced and was able to perform with supervision for safety. Pt also practiced transferring to/from the [facility] with the RW. Gross min assist required throughout session. Will continue to follow and progress as able per POC.     If plan is discharge home, recommend the following: A little help with walking and/or transfers;A little help with bathing/dressing/bathroom;Assistance with cooking/housework;Assist for transportation;Help with stairs or ramp for entrance   Can travel by private vehicle     Yes  Equipment Recommendations  Wheelchair cushion (measurements PT) (Already has wheelchair, needs wheelchair cushion)    Recommendations for Other Services       Precautions / Restrictions Precautions Precautions: Fall Recall of Precautions/Restrictions: Intact Required Braces or Orthoses:  (Limb guard for L residual limb, post op shoe for R LE) Restrictions Weight Bearing Restrictions Per Provider Order: Yes RLE Weight Bearing Per Provider Order: Weight bearing as tolerated LLE Weight Bearing Per Provider Order: Non weight bearing Other Position/Activity Restrictions: WBAT R foot with post op shoe, NWB LLE with limb  protector     Mobility  Bed Mobility Overal bed mobility: Modified Independent Bed Mobility: Supine to Sit     Supine to sit: HOB elevated     General bed mobility comments: Increased time but no assist required.    Transfers Overall transfer level: Needs assistance Equipment used: Rolling walker (2 wheels), Sliding board Transfers: Bed to chair/wheelchair/BSC, Sit to/from Stand Sit to Stand: Min assist Stand pivot transfers: Min assist        Lateral/Scoot Transfers: Supervision General transfer comment: Patent attorney for transition bed>chair. VC's for sequencing and pt was able to complete without assist. From chair, practiced transfer to [facility] with RW. Assist required for power up as pt practicing how she will have to do at home with only 1 arm rest on the L. Pt required min assist to stand and SPT to [facility]. No assist to power up with B arm rests of BSC for transfer back to chair.    Ambulation/Gait                   Stairs             Wheelchair Mobility     Tilt Bed    Modified Rankin (Stroke Patients Only)       Balance Overall balance assessment: Needs assistance Sitting-balance support: No upper extremity supported, Feet supported Sitting balance-Leahy Scale: Good     Standing balance support: Reliant on assistive device for balance, Bilateral upper extremity supported, During functional activity Standing balance-Leahy Scale: Poor Standing balance comment: Up to min assist to steady  Communication Communication Communication: No apparent difficulties  Cognition Arousal: Alert Behavior During Therapy: WFL for tasks assessed/performed   PT - Cognitive impairments: No apparent impairments                         Following commands: Intact      Cueing Cueing Techniques: Verbal cues, Gestural cues  Exercises      General Comments General comments (skin integrity, edema, etc.):  Educated on slide board use, options for transfers to/from [facility] (pt doesnt believe it is a drop arm BSC that she has at home), and positioning recommendations to relieve sciatic pain and for optimal L residual limb positioning as well.      Pertinent Vitals/Pain Pain Assessment Pain Assessment: Faces Faces Pain Scale: Hurts little more Pain Location: Pt reports sciatic nerve pain Pain Descriptors / Indicators: Burning, Constant, Grimacing, Radiating Pain Intervention(s): Limited activity within patient's tolerance, Monitored during session, Repositioned    Home Living                          Prior Function            PT Goals (current goals can now be found in the care plan section) Acute Rehab PT Goals Patient Stated Goal: Be able to get to [place]  to stay with family PT Goal Formulation: With patient Time For Goal Achievement: 10/05/24 Potential to Achieve Goals: Good Progress towards PT goals: Progressing toward goals    Frequency    Min 3X/week      PT Plan      Co-evaluation              AM-PAC PT 6 Clicks Mobility   Outcome Measure  Help needed turning from your back to your side while in a flat bed without using bedrails?: A Little Help needed moving from lying on your back to sitting on the side of a flat bed without using bedrails?: A Little Help needed moving to and from a bed to a chair (including a wheelchair)?: A Little Help needed standing up from a chair using your arms (e.g., wheelchair or bedside chair)?: A Little Help needed to walk in hospital room?: A Lot Help needed climbing 3-5 steps with a railing? : Total 6 Click Score: 15    End of Session Equipment Utilized During Treatment: Gait belt Activity Tolerance: Patient tolerated treatment well Patient left: with call bell/phone within reach;in chair;with chair alarm set Nurse Communication: Mobility status PT Visit Diagnosis: Unsteadiness on feet (R26.81);Other abnormalities  of gait and mobility (R26.89);Muscle weakness (generalized) (M62.81);Difficulty in walking, not elsewhere classified (R26.2);Other symptoms and signs involving the nervous system (R29.898);Pain Pain - Right/Left: Left Pain - part of body: Leg     Time: 1021-1100 PT Time Calculation (min) (ACUTE ONLY): 39 min  Charges:    $Gait Training: 8-22 mins $Therapeutic Activity: 8-22 mins PT General Charges $$ ACUTE PT VISIT: 1 Visit                     Karen [name], PT, DPT Acute Rehabilitation Services Secure Chat Preferred Office: [phone]    Karen [name] 09/25/2024, 1:11 PM

## 2024-09-26 DIAGNOSIS — L089 Local infection of the skin and subcutaneous tissue, unspecified: Secondary | ICD-10-CM | POA: Diagnosis not present

## 2024-09-26 DIAGNOSIS — M86649 Other chronic osteomyelitis, unspecified hand: Secondary | ICD-10-CM

## 2024-09-26 DIAGNOSIS — I739 Peripheral vascular disease, unspecified: Secondary | ICD-10-CM

## 2024-09-26 DIAGNOSIS — Z89512 Acquired absence of left leg below knee: Secondary | ICD-10-CM | POA: Diagnosis not present

## 2024-09-26 DIAGNOSIS — I96 Gangrene, not elsewhere classified: Secondary | ICD-10-CM | POA: Diagnosis not present

## 2024-09-26 DIAGNOSIS — M86372 Chronic multifocal osteomyelitis, left ankle and foot: Secondary | ICD-10-CM | POA: Diagnosis present

## 2024-09-26 DIAGNOSIS — E11628 Type 2 diabetes mellitus with other skin complications: Secondary | ICD-10-CM | POA: Diagnosis not present

## 2024-09-26 LAB — CBC WITH DIFFERENTIAL/PLATELET
Abs Immature Granulocytes: 0.11 K/uL — ABNORMAL HIGH (ref 0.00–0.07)
Basophils Absolute: 0.1 K/uL (ref 0.0–0.1)
Basophils Relative: 0 %
Eosinophils Absolute: 0.2 K/uL (ref 0.0–0.5)
Eosinophils Relative: 1 %
HCT: 25.1 % — ABNORMAL LOW (ref 36.0–46.0)
Hemoglobin: 8 g/dL — ABNORMAL LOW (ref 12.0–15.0)
Immature Granulocytes: 1 %
Lymphocytes Relative: 13 %
Lymphs Abs: 2.3 K/uL (ref 0.7–4.0)
MCH: 29.4 pg (ref 26.0–34.0)
MCHC: 31.9 g/dL (ref 30.0–36.0)
MCV: 92.3 fL (ref 80.0–100.0)
Monocytes Absolute: 1.5 K/uL — ABNORMAL HIGH (ref 0.1–1.0)
Monocytes Relative: 9 %
Neutro Abs: 12.7 K/uL — ABNORMAL HIGH (ref 1.7–7.7)
Neutrophils Relative %: 76 %
Platelets: 358 K/uL (ref 150–400)
RBC: 2.72 MIL/uL — ABNORMAL LOW (ref 3.87–5.11)
RDW: 13.4 % (ref 11.5–15.5)
WBC: 16.8 K/uL — ABNORMAL HIGH (ref 4.0–10.5)
nRBC: 0 % (ref 0.0–0.2)

## 2024-09-26 LAB — BASIC METABOLIC PANEL WITH GFR
Anion gap: 18 — ABNORMAL HIGH (ref 5–15)
BUN: 45 mg/dL — ABNORMAL HIGH (ref 6–20)
CO2: 25 mmol/L (ref 22–32)
Calcium: 8.3 mg/dL — ABNORMAL LOW (ref 8.9–10.3)
Chloride: 88 mmol/L — ABNORMAL LOW (ref 98–111)
Creatinine, Ser: 6.95 mg/dL — ABNORMAL HIGH (ref 0.44–1.00)
GFR, Estimated: 7 mL/min — ABNORMAL LOW (ref >60)
Glucose, Bld: 154 mg/dL — ABNORMAL HIGH (ref 70–99)
Potassium: 4.7 mmol/L (ref 3.5–5.1)
Sodium: 131 mmol/L — ABNORMAL LOW (ref 135–145)

## 2024-09-26 LAB — GLUCOSE, CAPILLARY
Glucose-Capillary: 259 mg/dL — ABNORMAL HIGH (ref 70–99)
Glucose-Capillary: 184 mg/dL — ABNORMAL HIGH (ref 70–99)
Glucose-Capillary: 133 mg/dL — ABNORMAL HIGH (ref 70–99)
Glucose-Capillary: 93 mg/dL (ref 70–99)
Glucose-Capillary: 126 mg/dL — ABNORMAL HIGH (ref 70–99)
Glucose-Capillary: 162 mg/dL — ABNORMAL HIGH (ref 70–99)

## 2024-09-26 LAB — SEDIMENTATION RATE: Sed Rate: >140 mm/h — ABNORMAL HIGH (ref 0–22)

## 2024-09-26 LAB — C-REACTIVE PROTEIN: CRP: 22.6 mg/dL — ABNORMAL HIGH (ref <1.0)

## 2024-09-26 MED ORDER — FLEET ENEMA RE ENEM
1.0000 | ENEMA | Freq: Once | RECTAL | Status: AC
  Administered 2024-09-26: 1 via RECTAL
  Filled 2024-09-26: qty 1

## 2024-09-26 MED ORDER — CHLORHEXIDINE GLUCONATE CLOTH 2 % EX PADS
6.0000 | MEDICATED_PAD | Freq: Every day | CUTANEOUS | Status: DC
  Administered 2024-09-26 – 2024-09-27 (×2): 6 via TOPICAL

## 2024-09-26 NOTE — Plan of Care (Signed)
  Problem: Elimination: Goal: Will not experience complications related to bowel motility Outcome: Progressing   Problem: Coping: Goal: Ability to adjust to condition or change in health will improve Outcome: Progressing   Problem: Pain Management: Goal: Pain level will decrease with appropriate interventions Outcome: Progressing

## 2024-09-26 NOTE — Consult Note (Signed)
 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) Active Problems:   ESRD on hemodialysis (HCC)   DM2 (diabetes mellitus, type 2) (HCC)   Nausea & vomiting   Diabetic foot infection (HCC)   Essential hypertension   Anemia in chronic kidney disease   End stage renal disease on dialysis (HCC)   Gangrene of left foot (HCC)   Sepsis (HCC)   CAD S/P percutaneous coronary angioplasty   Scheduled Meds:  (feeding supplement) PROSource Plus  30 mL Oral BID BM   aspirin  EC  81 mg Oral Daily   brimonidine  1 drop Both Eyes TID   Chlorhexidine  Gluconate Cloth  6 each Topical Q0600   cinacalcet   90 mg Oral Q breakfast   gabapentin   100 mg Oral QHS   gabapentin   200 mg Oral Daily   insulin  pump   Subcutaneous TID WC, HS, 0200   Lanthanum  Carbonate  2,000 mg Oral TID WC   metoprolol  tartrate  12.5 mg Oral 2 times per day on Sunday Tuesday Thursday Saturday   pantoprazole  40 mg Oral Daily   senna  1 tablet Oral Daily    sorbitol  30 mL Oral Daily   Continuous Infusions: PRN Meds:.acetaminophen  **OR** acetaminophen , albuterol , HYDROmorphone  (DILAUDID ) injection, lanthanum , methocarbamol, ondansetron  (ZOFRAN ) IV, mouth rinse, oxyCODONE , oxyCODONE , polyethylene glycol, promethazine, promethazine  HPI: Karen [name] is a 48 y.o. 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  Unfortunately she has dealt with ischemic issues with her feet and osteomyelitis.   She was seen by vascular surgery for bilateral critical limb ischemia and underwent aortogram with cannulation of left lower extremity angiogram and left popliteal artery drug coated balloon angioplasty on September 08, 2024.   He then had amputation of the right toe at the PIPJ level and transmetatarsal amputation of the left foot performed by Dr. Malvin on September 10, 2024.  Operative cultures from that surgery yielded Morganella morganii as well as Proteus mirabilis and Enterococcus faecalis.  At hospitalization she was treated with Zosyn  and vancomycin  then Zosyn  alone and discharged on oral ciprofloxacin  on the 10th   Unfortunately she developed critical ischemic findings on the left side and underwent left below the knee amputation by vascular surgery on October 20th 2025.  During that  hospitalization she was treated with cefepime and vancomycin  from the 16th through the second and then was going to be discharged but had low-grade temperatures and was placed on oral Augmentin.   5 the right foot has been performed which shows osteomyelitis in the head of the third proximal phalanx.  The patient has been seen by Dr. Lamount from Podiatry who recommended plain films that are unrevealing.  Initially I was under the mistaken  impression that the toe imaged now was adjacent to the amputated toe, but it is in fact the amputated toe.  Certainly in this situation an MRI read could be difficult and distinguishing postoperative changes from osteomyelitis.  That being said said the fact that she continues to have low-grade temperatures and does not feel well makes me concerned that she still has residual osteomyelitis.  I would be strongly in favor of more proximal amputation  I will discontinue her antibiotics to increase the yield on operative cultures.  The organism she grew from her second toe would not be sensitive to Augmentin.  She is requesting labs to be done that her HD center and [place]  are requesting namely an updated hepatitis B surface antibody and QuantiFERON gold I did warn her that getting QuantiFERON gold in the hospital can often lead to indeterminate results and if we have an indeterminate result it would be best if this test is repeated as an outpatient but in the interim I ordered the QuantiFERON gold as well as a quantitative hepatitis B surface antibody  I have personally spent 85 minutes involved in face-to-face and non-face-to-face activities for this patient on the day of the visit. Professional time spent includes the following activities: Preparing to see the patient (review of tests), Obtaining and/or reviewing separately obtained history (admission/discharge record), Performing a medically appropriate examination and/or evaluation , Ordering medications/tests/procedures, referring and communicating with other health care professionals, Documenting clinical information in the EMR, Independently interpreting results (not separately reported), Communicating results to the patient/family/caregiver, Counseling and educating the patient/family/caregiver and Care coordination (not separately reported).   Evaluation of the patient requires complex antimicrobial therapy evaluation, counseling , isolation  needs to reduce disease transmission and risk assessment and mitigation.     Review of Systems: Review of Systems  Constitutional:  Positive for fever. Negative for chills, malaise/fatigue and weight loss.  HENT:  Negative for congestion and sore throat.   Eyes:  Negative for blurred vision and photophobia.  Respiratory:  Negative for cough, shortness of breath and wheezing.   Cardiovascular:  Negative for chest pain, palpitations and leg swelling.  Gastrointestinal:  Negative for abdominal pain, blood in stool, constipation, diarrhea, heartburn, melena, nausea and vomiting.  Genitourinary:  Negative for dysuria, flank pain and hematuria.  Musculoskeletal:  Negative for back pain, falls, joint pain and myalgias.  Skin:  Negative for itching and rash.  Neurological:  Negative for dizziness, focal weakness, loss of consciousness, weakness and headaches.  Endo/Heme/Allergies:  Does not bruise/bleed easily.  Psychiatric/Behavioral:  Negative for depression and suicidal ideas. The patient does not have insomnia.     Past Medical History:  Diagnosis Date   ADHD (attention deficit hyperactivity disorder)    Anemia    Arthritis    Asthma    mild = rarely uses inhaler   Blindness of left eye    retinopathy   Diabetes mellitus without complication (HCC)    type 2   Dyspnea    with exertion   Fibromyalgia    GERD (gastroesophageal  reflux disease)    no meds   Hypertension    Lung nodule    1.0 cm LLL lung nodule 07/16/22 CT (evaluation by [facility] Dr. Allyne Render)   Nausea & vomiting 10/22/2017   Osteomyelitis (HCC)    RIGHT FOOT FIFTH TOE   Ovarian mass, left 06/26/2021   Renal disorder    ESRD   Sleep apnea    does not use cpap    Social History   Tobacco Use   Smoking status: Former    Current packs/day: 0.00    Average packs/day: 1 pack/day for 25.0 years (25.0 ttl pk-yrs)    Types: Cigarettes    Start date: 08/03/1995    Quit date: 08/02/2020    Years since quitting: 4.1     Passive exposure: Never   Smokeless tobacco: Never  Vaping Use   Vaping status: Never Used  Substance Use Topics   Alcohol  use: Not Currently    Comment: occ   Drug use: Yes    Types: Marijuana    Comment: Last use was on 08/01/22    Family History  Problem Relation Age of Onset   Breast cancer Mother    Diabetes Father    Breast cancer Maternal Grandmother    Stomach cancer Maternal Grandmother    Diabetes Paternal Grandmother    Breast cancer Maternal Aunt    COPD Maternal Aunt    COPD Maternal Aunt    Heart failure Maternal Aunt    COPD Maternal Aunt    Kidney cancer Maternal Aunt    Allergies  Allergen Reactions   Furosemide Swelling   Heparin  Other (See Comments)    Patient relates vitreous hemorrhage after heparin . Patient relates vitreous hemorrhage after heparin .    OBJECTIVE: Blood pressure 133/73, pulse 74, temperature 98.4 F (36.9 C), resp. rate 19, height 5' 5 (1.651 m), weight 83 kg, SpO2 99%.  Physical Exam Constitutional:      General: She is not in acute distress.    Appearance: Normal appearance. She is well-developed. She is not ill-appearing or diaphoretic.  HENT:     Head: Normocephalic and atraumatic.     Right Ear: Hearing and external ear normal.     Left Ear: Hearing and external ear normal.     Nose: No nasal deformity or rhinorrhea.  Eyes:     General: No scleral icterus.    Conjunctiva/sclera: Conjunctivae normal.     Right eye: Right conjunctiva is not injected.     Left eye: Left conjunctiva is not injected.     Pupils: Pupils are equal, round, and reactive to light.  Neck:     Vascular: No JVD.  Cardiovascular:     Rate and Rhythm: Normal rate and regular rhythm.     Heart sounds: Normal heart sounds, S1 normal and S2 normal. No murmur heard.    No friction rub.  Abdominal:     General: Bowel sounds are normal. There is no distension.     Palpations: Abdomen is soft.     Tenderness: There is no abdominal tenderness.   Musculoskeletal:     Right shoulder: Normal.     Left shoulder: Normal.     Cervical back: Normal range of motion and neck supple.     Right hip: Normal.     Left hip: Normal.     Right knee: Normal.     Left knee: Normal.  Lymphadenopathy:     Head:     Right side of head:  No submandibular, preauricular or posterior auricular adenopathy.     Left side of head: No submandibular, preauricular or posterior auricular adenopathy.     Cervical: No cervical adenopathy.     Right cervical: No superficial or deep cervical adenopathy.    Left cervical: No superficial or deep cervical adenopathy.  Skin:    General: Skin is warm and dry.     Coloration: Skin is not pale.     Findings: No abrasion, bruising, ecchymosis, erythema, lesion or rash.     Nails: There is no clubbing.  Neurological:     Mental Status: She is alert and oriented to person, place, and time.     Sensory: No sensory deficit.     Coordination: Coordination normal.     Gait: Gait normal.  Psychiatric:        Attention and Perception: She is attentive.        Mood and Affect: Mood normal.        Speech: Speech normal.        Behavior: Behavior normal. Behavior is cooperative.        Thought Content: Thought content normal.        Judgment: Judgment normal.    Left knee with bandage  Right foot pictures    Lab Results Lab Results  Component Value Date   WBC 16.8 (H) 09/26/2024   HGB 8.0 (L) 09/26/2024   HCT 25.1 (L) 09/26/2024   MCV 92.3 09/26/2024   PLT 358 09/26/2024    Lab Results  Component Value Date   CREATININE 6.95 (H) 09/26/2024   BUN 45 (H) 09/26/2024   NA 131 (L) 09/26/2024   K 4.7 09/26/2024   CL 88 (L) 09/26/2024   CO2 25 09/26/2024    Lab Results  Component Value Date   ALT 14 09/18/2024   AST 15 09/18/2024   ALKPHOS 246 (H) 09/18/2024   BILITOT 0.6 09/18/2024     Microbiology: Recent Results (from the past 240 hours)  Culture, blood (Routine x 2)     Status: None   Collection  Time: 09/16/24  5:10 PM   Specimen: BLOOD RIGHT ARM  Result Value Ref Range Status   Specimen Description BLOOD RIGHT ARM  Final   Special Requests   Final    BOTTLES DRAWN AEROBIC AND ANAEROBIC Blood Culture results may not be optimal due to an inadequate volume of blood received in culture bottles   Culture   Final    NO GROWTH 5 DAYS Performed at [facility], [address], [place], [place] [address]    Report Status 09/21/2024 FINAL  Final  Culture, blood (Routine x 2)     Status: None   Collection Time: 09/16/24  5:42 PM   Specimen: BLOOD RIGHT ARM  Result Value Ref Range Status   Specimen Description BLOOD RIGHT ARM  Final   Special Requests   Final    BOTTLES DRAWN AEROBIC AND ANAEROBIC Blood Culture results may not be optimal due to an inadequate volume of blood received in culture bottles   Culture   Final    NO GROWTH 5 DAYS Performed at [facility], [address] [place], [place] [address]    Report Status 09/21/2024 FINAL  Final  Body fluid culture w Gram Stain     Status: None   Collection Time: 09/16/24  9:07 PM   Specimen: Peritoneal Washings; Body Fluid  Result Value Ref Range Status   Specimen Description PERITONEAL  FLUID  Final   Special Requests Immunocompromised  Final   Gram Stain NO ORGANISMS SEEN NO WBC SEEN CYTOSPIN SMEAR   Final   Culture   Final    NO GROWTH 3 DAYS Performed at [facility], [address] [place], [place] [address]    Report Status 09/20/2024 FINAL  Final  MRSA Next Gen by PCR, Nasal     Status: None   Collection Time: 09/17/24  4:48 AM   Specimen: Nasal Mucosa; Nasal Swab  Result Value Ref Range Status   MRSA by PCR Next Gen NOT DETECTED NOT DETECTED Final    Comment: (NOTE) The GeneXpert MRSA Assay (FDA approved for NASAL specimens only), is one component of a comprehensive MRSA colonization surveillance program. It is not intended to diagnose MRSA infection nor to guide or monitor treatment for MRSA  infections. Test performance is not FDA approved in patients less than 63 years old. Performed at [facility], [address] [place], [place] [address]   Culture, blood (Routine X 2) w Reflex to ID Panel     Status: None (Preliminary result)   Collection Time: 09/25/24  5:19 AM   Specimen: BLOOD RIGHT HAND  Result Value Ref Range Status   Specimen Description BLOOD RIGHT HAND  Final   Special Requests   Final    BOTTLES DRAWN AEROBIC AND ANAEROBIC Blood Culture adequate volume   Culture   Final    NO GROWTH 1 DAY Performed at [facility], [address] [place], [place] [address]    Report Status PENDING  Incomplete  Culture, blood (Routine X 2) w Reflex to ID Panel     Status: None (Preliminary result)   Collection Time: 09/25/24  5:22 AM   Specimen: BLOOD RIGHT ARM  Result Value Ref Range Status   Specimen Description BLOOD RIGHT ARM  Final   Special Requests   Final    BOTTLES DRAWN AEROBIC AND ANAEROBIC Blood Culture adequate volume   Culture   Final    NO GROWTH 1 DAY Performed at [facility], [address] [place], [place] [address]    Report Status PENDING  Incomplete    Jomarie Fleeta Rothman, MD [facility] [phone] pager  09/26/2024, 12:16 PM

## 2024-09-26 NOTE — Progress Notes (Signed)
 Subjective:  Patient ID: Karen [name], female    DOB: December 26, 1975,  MRN: [id number]  Chief Complaint  Patient presents with   Post-op Problem    DOS: 09/10/2024 Procedure: 1. Amputation of right third toe at PIPJ level 2. Transmetatarsal amputation of left foot- Now status post below knee amputation 10/20  48 y.o. female seen resting at bedside.  She is now s/p L BKA. She is 2 weeks out from the right foot partial 3rd toe amputation. Weightbearing in post op shoe RLE and denies pain on the right. She continues to have pain at the below knee amputation site. She has been afebrile today. Does report another episode of emesis, constipation, reports malaise. She reports that her last bowel movement was after her last surgery on 10/20.  She is very concerned about her living situation and continuity of care going forward as she is currently living with her sister until she moves roughly 4 hours away to [place]  once her dialysis is set up.  Review of Systems: Negative except as noted in the HPI.    Objective:   Constitutional Well developed. Well nourished.  Vascular Foot warm and well perfused. Capillary refill normal to all digits.   No calf pain with palpation  Neurologic Normal speech. Oriented to person, place, and time. Epicritic sensation diminished to bilateral forefoot  Dermatologic Right foot dressing left intact today no ascending erythema, no drainage to the dressing.  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 Left side below knee amputation.   Radiographs: Right foot 3 views 10/25 IMPRESSION: 1. No bony destructive changes of the third proximal phalanx to correspond to osteomyelitis on MRI. 2. Previous resection of the third toe at the proximal interphalangeal joint. 3. Previous transmetatarsal amputation of the fifth ray.    IMPRESSION: 1. Findings consistent with  osteomyelitis involving the head of the third proximal phalanx. 2. No discrete drainable abscess identified.   Electronically signed by: Waddell Calk MD 09/25/2024 09:56 AM EDT RP Workstation: [id number]  Pathology: Right third toe showed necrotizing inflammation with calcific atherosclerosis, disarticulation margin was negative for osteomyelitis  Micro:   ABUNDANT MORGANELLA MORGANII ABUNDANT PROTEUS MIRABILIS    Assessment:   1. Sepsis, due to unspecified organism, unspecified whether acute organ dysfunction present (HCC)   2. 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  16 days s/p left foot transmetatarsal amputation and partial right third toe amputation.  Now s/p L BKA with vascular.  - Dressings left clean dry and intact today. No right foot pain per patient. - Foot dressings: Reapplied Xeroform, 4 x 4 gauze, Kerlix, Ace wrap bandage today. -Continue 2 times weekly dressing changes with home health for the dorsal foot wound and monitor for signs of dehiscence to the toe. -Imaging: have reviewed MRI and new xr. No clear evidence of new osseous destruction and the recent surgery confounds MRI findings. -WB Status:Weightbearing as tolerated to right foot in postop shoe -Medications/ABX: ID recommending that we monitor off antibiotics. -If she worsens off antibiotics, white count and inflammatory markers increase, could proceed with amputation vs bone biopsy of the remaining right 3rd toe phalanx - More surgery carries some risk due to PAD and decreased healing  potential, as well as patient's plans to leave the area without established post operative care set up -She does state that it has been close to a week since her last -Dressing:  appreciate HH - 2x weekly dressing changes with above dsg. Ok to wash the right foot prn at this time. - F/u Plan: Awaiting further workup        Karen [name] DPM [facility] / [facility]

## 2024-09-26 NOTE — Progress Notes (Signed)
 [facility] Progress Note   Subjective:    Seen and examined patient at bedside. She reports feeling better this morning. Now on RA. Reviewed Hospitalist note, MRI of R foot (+) osteo at head of third proximal phalanx no discrete drainable abscess. Awaiting further plan from Podiatry standpoint. Next HD 10/27.  Objective Vitals:   09/25/24 1709 09/25/24 2006 09/26/24 0313 09/26/24 0857  BP: (!) 180/102 (!) 113/56 (!) 140/69 133/73  Pulse: 85 73 86 74  Resp: 19 16 18 19   Temp: 98.1 F (36.7 C) 98.9 F (37.2 C) 98.8 F (37.1 C) 98.4 F (36.9 C)  TempSrc:  Oral Oral   SpO2: 99% 96% 93% 99%  Weight:      Height:       Physical Exam General: Alert female in NAD, on RA Heart: RRR, no murmurs, rubs or gallops Lungs: CTA bilaterally, respirations unlabored Abdomen: Soft, non-distended, +BS Extremities: L BKA, R foot wrapped, no edema appreciated Dialysis Access: AVF + t/b, PD cath in abdomen  Filed Weights   09/22/24 0903 09/22/24 1352 09/24/24 1344  Weight: 87 kg 85 kg 83 kg    Intake/Output Summary (Last 24 hours) at 09/26/2024 1034 Last data filed at 09/25/2024 1836 Gross per 24 hour  Intake 900 ml  Output 0 ml  Net 900 ml    Additional Objective Labs: Basic Metabolic Panel: Recent Labs  Lab 09/21/24 0410 09/22/24 0521 09/24/24 0955 09/25/24 0519 09/26/24 0428  NA 135 134* 131* 130* 131*  K 4.8 5.6* 4.8 4.2 4.7  CL 94* 91* 90* 89* 88*  CO2 26 22 23 27 25   GLUCOSE 153* 118* 256* 168* 154*  BUN 41* 58* 44* 28* 45*  CREATININE 6.07* 7.83* 7.17* 4.89* 6.95*  CALCIUM  9.1 8.9 8.2* 8.0* 8.3*  PHOS 5.8* 7.3* 6.1*  --   --    Liver Function Tests: Recent Labs  Lab 09/21/24 0410 09/22/24 0521 09/24/24 0955  ALBUMIN  2.4* 2.5* 2.2*   No results for input(s): LIPASE, AMYLASE in the last 168 hours. CBC: Recent Labs  Lab 09/21/24 0410 09/22/24 0521 09/24/24 0955 09/25/24 0519 09/26/24 0428  WBC 15.3* 11.6* 12.4* 14.8* 16.8*  NEUTROABS   --   --   --  10.6* 12.7*  HGB 8.9* 9.4* 7.4* 7.8* 8.0*  HCT 28.7* 30.6* 23.8* 24.5* 25.1*  MCV 95.3 94.7 94.8 94.6 92.3  PLT 499* 488* 353 354 358   Blood Culture    Component Value Date/Time   SDES BLOOD RIGHT ARM 09/25/2024 0522   SPECREQUEST  09/25/2024 0522    BOTTLES DRAWN AEROBIC AND ANAEROBIC Blood Culture adequate volume   CULT  09/25/2024 0522    NO GROWTH 1 DAY Performed at [facility], [address], [place], [place] [address]    REPTSTATUS PENDING 09/25/2024 0522    Cardiac Enzymes: No results for input(s): CKTOTAL, CKMB, CKMBINDEX, TROPONINI in the last 168 hours. CBG: Recent Labs  Lab 09/25/24 1709 09/25/24 2007 09/26/24 0121 09/26/24 0311 09/26/24 0858  GLUCAP 233* 211* 259* 184* 133*   Iron Studies: No results for input(s): IRON, TIBC, TRANSFERRIN, FERRITIN in the last 72 hours. Lab Results  Component Value Date   INR 1.1 09/16/2024   INR 1.1 09/06/2024   Studies/Results: DG Foot Complete Right Result Date: 09/25/2024 CLINICAL DATA:  Osteomyelitis of third toe. EXAM: RIGHT FOOT COMPLETE - 3+ VIEW COMPARISON:  MRI earlier today FINDINGS: No bony destructive changes of the third proximal phalanx to correspond to osteomyelitis on  MRI. Previous resection of the third toe at the proximal interphalangeal joint. Overlying dressing in place. Previous transmetatarsal amputation of the fifth ray. Chronic navicular fracture and disorganized articulation of the mid and hindfoot. IMPRESSION: 1. No bony destructive changes of the third proximal phalanx to correspond to osteomyelitis on MRI. 2. Previous resection of the third toe at the proximal interphalangeal joint. 3. Previous transmetatarsal amputation of the fifth ray. Electronically Signed   By: Andrea Gasman M.D.   On: 09/25/2024 16:07   MR FOOT RIGHT W WO CONTRAST Result Date: 09/25/2024 EXAM: MRI of the right Foot without and with contrast. 09/25/2024 05:56:29 AM TECHNIQUE: Multiplanar  multisequence MRI of the right foot was performed without and with the administration of 8 mL gadobutrol (GADAVIST) 1 MMOL/ML injection. COMPARISON: Radiographs 09/16/2024 and MRI 09/07/2024. 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 BONE MARROW: Again seen are chronic postoperative changes from fifth-grade amputation at the level of the midshaft of the metatarsal bone. Postsurgical changes from 3rd grade amputation at the level of the PIP joint. On the T1 weighted sequences, there is abnormality low T1 signal within the head of the 3rd proximal phalanx with corresponding increased T2 signal. Coronal on the postcontrast images there is associated enhancement involving the head of the third proximal phalanx (axial image 14/10). Report image 13/7 and coronal image 13/6. There is fluid signal intensity surrounding the ray toe from the MTP joint through the distal aspect of the distal phalanx. There is no T1 signal abnormality within the bone to suggest osteomyelitis. 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 No aggressive marrow replacing lesion. GREATER AND LESSER MTP JOINTS: Postsurgical changes from 3rd ray amputation at the level of the PIP joint. On the T1 weighted sequences, there is abnormality low T1 signal within the head of the 3rd proximal phalanx with corresponding increased T2 signal. On the postcontrast images there is associated enhancement involving the head of the third proximal phalanx (axial image 14/10). There is fluid signal intensity surrounding the great toe from the MTP joint through the distal aspect of the distal phalanx. There is no T1 signal abnormality within the bone to suggest osteomyelitis. Severe midfoot disease is again noted with marked bone  abnormalities, subluxations, and marrow edema compatible with a neuropathic joint. SOFT TISSUES: Mild diffuse soft tissue edema about the foot. No discrete fluid collections identified to suggest abscess. TENDONS: Visualized flexor and extensor tendons are intact without tenosynovitis. IMPRESSION: 1. 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 2. No discrete drainable abscess identified. Electronically signed by: Waddell Calk MD 09/25/2024 09:56 AM EDT RP Workstation: [id number]    Medications:   (feeding supplement) PROSource Plus  30 mL Oral BID BM   aspirin  EC  81 mg Oral Daily   brimonidine  1 drop Both Eyes TID   cinacalcet   90 mg Oral Q breakfast   gabapentin   100 mg Oral QHS   gabapentin   200 mg Oral Daily   insulin  pump   Subcutaneous TID WC, HS, 0200   Lanthanum  Carbonate  2,000 mg Oral TID WC   metoprolol  tartrate  12.5 mg Oral 2 times per day on Sunday Tuesday Thursday Saturday   pantoprazole  40 mg Oral Daily   senna  1 tablet Oral Daily   sorbitol  30 mL Oral  Daily    Dialysis Orders: MWF [facility] From last week -->  4h  B400   90kg 2K bath  AVF  Heparin  none  Assessment/Plan: 1.  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 Reviewed Hospitalist note, MRI of R foot (+) osteo at head of third proximal phalanx no discrete drainable abscess. Awaiting further plan from Podiatry standpoint 2. ESRD: Continue MWF schedule, next HD 10/27. She has a PD cath but has not started PD yet, PD cath flushed 10/24.  3. Anemia: Hgb 9.4-improving. Will continue to follow trend and decide on need for ESA redosing 4. CKD-MBD: Calcium  level variable, not on VDRA. Phos variable. Continue lanthanum . On sensipar  for PTH 5. Nutrition: Continue renal diet with oral nutritional supplementation to hasten wound healing. Changed to renal/carb modified  diet per her request so she can track her carbs, on insulin  pump.  6. Hypertension: Blood pressure currently controlled 7. Dispo: Inpatient  Charmaine Piety, NP [facility] 09/26/2024,10:34 AM  LOS: 10 days

## 2024-09-26 NOTE — Progress Notes (Signed)
 PROGRESS NOTE   Karen [name]  FMW:[id number]    DOB: 1976/04/16    DOA: 09/16/2024  PCP: Lesa Jon HERO, PA   I have briefly reviewed patients previous medical records in [facility].   Brief Hospital Course:  48 y.o. 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   Chronology   Admission 10/6-10/13  left foot transmetatarsal amputation and right foot third toe amputation on September 10, 2024  angiogram with [facility] angioplasty of the left popliteal artery  10/16  re-admit weakness fever chills last 2-3 days--noted discolored transmetatarsal flap.  Patient was started on empiric antibiotics.  10/18  angiography second-order cannulation L popliteal artery drug-coated balloon angioplasty 4X 60 cm-Dr. Silver 10/20 BKA performed per [facility] 10/24 low-grade fevers prompting us  to cancel the discharge on 10/24 and podiatry was reconsulted 10/25 ID consulted foot performed showing possible osteomyelitis third proximal phalanx no discernible drainable abscess-podiatry waiting back in  Assessment & Plan:   Infectious PAD diabetic polyneuropathy.  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 Malvin Area of concern noted and appreciated of both Dr. Lamount and Dr. Lindia input Antibiotics Augmentin that were resumed on 10/24 have been canceled Patient had previously completed cefepime vancomycin  09/11/2015-10/22 ESR >140, CRP >22 as well as white count of 16 indicative of probably something brewing so we will observe her off antibiotics for fever Continued pain control Tylenol , Oxy IR 10 Q3 as needed severe Oxy IR 5 every 3 as needed moderate, Dilaudid  for very severe pain continues gabapentin  100 at bedtime gabapentin  200 daily and methocarbamol 500 every 6 as needed additionally I suspect she will need monitoring in the  hospital for several more days before we can make a final decision about discharge disposition-if we discharged her before we have a clear plan or if she has fever she may need an amputation BKA 09/2019 25 Dr. Lonni Gaskins Area of concern as above-her stump looked clean the last time I checked BMI 30 Type 2 diabetes mellitus A1c 6.5-using insulin  pump with polyneuropathy CBGs are ranging 90-1 84 Currently on the insulin  pump and we will follow ESRD MWF Peritoneal access immature at this time hypokalemia on admission Anemia renal disease Continue Fosrenol  1000 snack, 2000 3 times daily meal-check phosphorus with labs Watch hemoglobin- Not a candidate for IV iron May need Aranesp  Right hip pain  DVT prophylaxis: SCD's Start: 09/20/24 1016    Code Status: Full Code:  Family Communication: None at bedside. Disposition:  Status is: Inpatient   Unclear at this time   Consultants:   Podiatry Vascular surgery Nephrology  Procedures:   As above  Subjective:    aware of wait and watch approach Pain is moderate she is able to sleep No chest pain  Objective:   Vitals:   09/25/24 1709 09/25/24 2006 09/26/24 0313 09/26/24 0857  BP: (!) 180/102 (!) 113/56 (!) 140/69 133/73  Pulse: 85 73 86 74  Resp: 19 16 18 19   Temp: 98.1 F (36.7 C) 98.9 F (37.2 C) 98.8 F (37.1 C) 98.4 F (36.9 C)  TempSrc:  Oral Oral   SpO2: 99% 96% 93% 99%  Weight:      Height:        Awake coherent no distress EOMI NCAT no focal deficit no icterus no pallor S1-S2 no murmur nsr  chest clear no added sound abdomen soft no  rebound I did not examine wounds today  Data Reviewed:   I have personally reviewed following labs and imaging studies   CBC: Recent Labs  Lab 09/24/24 0955 09/25/24 0519 09/26/24 0428  WBC 12.4* 14.8* 16.8*  NEUTROABS  --  10.6* 12.7*  HGB 7.4* 7.8* 8.0*  HCT 23.8* 24.5* 25.1*  MCV 94.8 94.6 92.3  PLT 353 354 358    Basic Metabolic Panel: Recent Labs  Lab  09/20/24 0523 09/21/24 0410 09/22/24 0521 09/24/24 0955 09/25/24 0519 09/26/24 0428  NA 134* 135 134* 131* 130* 131*  K 5.9* 4.8 5.6* 4.8 4.2 4.7  CL 95* 94* 91* 90* 89* 88*  CO2 25 26 22 23 27 25   GLUCOSE 138* 153* 118* 256* 168* 154*  BUN 69* 41* 58* 44* 28* 45*  CREATININE 9.54* 6.07* 7.83* 7.17* 4.89* 6.95*  CALCIUM  8.4* 9.1 8.9 8.2* 8.0* 8.3*  PHOS 6.9* 5.8* 7.3* 6.1*  --   --     Liver Function Tests: Recent Labs  Lab 09/21/24 0410 09/22/24 0521 09/24/24 0955  ALBUMIN  2.4* 2.5* 2.2*    CBG: Recent Labs  Lab 09/26/24 0311 09/26/24 0858 09/26/24 1140  GLUCAP 184* 133* 93       Scheduled Meds:    (feeding supplement) PROSource Plus  30 mL Oral BID BM   aspirin  EC  81 mg Oral Daily   brimonidine  1 drop Both Eyes TID   Chlorhexidine  Gluconate Cloth  6 each Topical Q0600   cinacalcet   90 mg Oral Q breakfast   gabapentin   100 mg Oral QHS   gabapentin   200 mg Oral Daily   insulin  pump   Subcutaneous TID WC, HS, 0200   Lanthanum  Carbonate  2,000 mg Oral TID WC   metoprolol  tartrate  12.5 mg Oral 2 times per day on Sunday Tuesday Thursday Saturday   pantoprazole  40 mg Oral Daily   senna  1 tablet Oral Daily   sodium phosphate   1 enema Rectal Once   sorbitol  30 mL Oral Daily      LOS: 10 days   Jai-Gurmukh Shivaan Tierno, MD,  \  09/26/2024, 3:47 PM

## 2024-09-27 ENCOUNTER — Ambulatory Visit: Admitting: Podiatry

## 2024-09-27 DIAGNOSIS — A499 Bacterial infection, unspecified: Secondary | ICD-10-CM

## 2024-09-27 DIAGNOSIS — E11628 Type 2 diabetes mellitus with other skin complications: Secondary | ICD-10-CM | POA: Diagnosis not present

## 2024-09-27 DIAGNOSIS — E1142 Type 2 diabetes mellitus with diabetic polyneuropathy: Secondary | ICD-10-CM | POA: Diagnosis not present

## 2024-09-27 DIAGNOSIS — L089 Local infection of the skin and subcutaneous tissue, unspecified: Secondary | ICD-10-CM | POA: Diagnosis not present

## 2024-09-27 DIAGNOSIS — N186 End stage renal disease: Secondary | ICD-10-CM | POA: Diagnosis not present

## 2024-09-27 DIAGNOSIS — Z794 Long term (current) use of insulin: Secondary | ICD-10-CM | POA: Diagnosis not present

## 2024-09-27 DIAGNOSIS — B964 Proteus (mirabilis) (morganii) as the cause of diseases classified elsewhere: Secondary | ICD-10-CM

## 2024-09-27 DIAGNOSIS — Z89512 Acquired absence of left leg below knee: Secondary | ICD-10-CM

## 2024-09-27 DIAGNOSIS — I96 Gangrene, not elsewhere classified: Secondary | ICD-10-CM | POA: Diagnosis not present

## 2024-09-27 DIAGNOSIS — Z992 Dependence on renal dialysis: Secondary | ICD-10-CM | POA: Diagnosis not present

## 2024-09-27 LAB — CBC WITH DIFFERENTIAL/PLATELET
Abs Immature Granulocytes: 0.08 K/uL — ABNORMAL HIGH (ref 0.00–0.07)
Basophils Absolute: 0.1 K/uL (ref 0.0–0.1)
Basophils Relative: 1 %
Eosinophils Absolute: 0.2 K/uL (ref 0.0–0.5)
Eosinophils Relative: 1 %
HCT: 24.1 % — ABNORMAL LOW (ref 36.0–46.0)
Hemoglobin: 7.6 g/dL — ABNORMAL LOW (ref 12.0–15.0)
Immature Granulocytes: 1 %
Lymphocytes Relative: 12 %
Lymphs Abs: 1.6 K/uL (ref 0.7–4.0)
MCH: 29.2 pg (ref 26.0–34.0)
MCHC: 31.5 g/dL (ref 30.0–36.0)
MCV: 92.7 fL (ref 80.0–100.0)
Monocytes Absolute: 1.3 K/uL — ABNORMAL HIGH (ref 0.1–1.0)
Monocytes Relative: 9 %
Neutro Abs: 10.9 K/uL — ABNORMAL HIGH (ref 1.7–7.7)
Neutrophils Relative %: 76 %
Platelets: 346 K/uL (ref 150–400)
RBC: 2.6 MIL/uL — ABNORMAL LOW (ref 3.87–5.11)
RDW: 13.5 % (ref 11.5–15.5)
WBC: 14.2 K/uL — ABNORMAL HIGH (ref 4.0–10.5)
nRBC: 0 % (ref 0.0–0.2)

## 2024-09-27 LAB — BASIC METABOLIC PANEL WITH GFR
Anion gap: 23 — ABNORMAL HIGH (ref 5–15)
BUN: 57 mg/dL — ABNORMAL HIGH (ref 6–20)
CO2: 20 mmol/L — ABNORMAL LOW (ref 22–32)
Calcium: 8.3 mg/dL — ABNORMAL LOW (ref 8.9–10.3)
Chloride: 89 mmol/L — ABNORMAL LOW (ref 98–111)
Creatinine, Ser: 8.74 mg/dL — ABNORMAL HIGH (ref 0.44–1.00)
GFR, Estimated: 5 mL/min — ABNORMAL LOW (ref >60)
Glucose, Bld: 142 mg/dL — ABNORMAL HIGH (ref 70–99)
Potassium: 5.4 mmol/L — ABNORMAL HIGH (ref 3.5–5.1)
Sodium: 132 mmol/L — ABNORMAL LOW (ref 135–145)

## 2024-09-27 LAB — GLUCOSE, CAPILLARY
Glucose-Capillary: 141 mg/dL — ABNORMAL HIGH (ref 70–99)
Glucose-Capillary: 132 mg/dL — ABNORMAL HIGH (ref 70–99)
Glucose-Capillary: 296 mg/dL — ABNORMAL HIGH (ref 70–99)
Glucose-Capillary: 176 mg/dL — ABNORMAL HIGH (ref 70–99)
Glucose-Capillary: 191 mg/dL — ABNORMAL HIGH (ref 70–99)

## 2024-09-27 LAB — PHOSPHORUS: Phosphorus: 7.1 mg/dL — ABNORMAL HIGH (ref 2.5–4.6)

## 2024-09-27 MED ORDER — HYDROMORPHONE HCL 1 MG/ML IJ SOLN
INTRAMUSCULAR | Status: AC
  Filled 2024-09-27: qty 0.5

## 2024-09-27 MED ORDER — DIPHENHYDRAMINE HCL 50 MG/ML IJ SOLN
25.0000 mg | Freq: Once | INTRAMUSCULAR | Status: AC
  Administered 2024-09-27: 25 mg via INTRAVENOUS

## 2024-09-27 MED ORDER — OXYCODONE HCL 5 MG PO TABS
ORAL_TABLET | ORAL | Status: AC
  Filled 2024-09-27: qty 2

## 2024-09-27 MED ORDER — GENTAMICIN SULFATE 0.1 % EX CREA
TOPICAL_CREAM | Freq: Once | CUTANEOUS | Status: DC
  Filled 2024-09-27: qty 15

## 2024-09-27 MED ORDER — DIPHENHYDRAMINE HCL 50 MG/ML IJ SOLN
INTRAMUSCULAR | Status: AC
  Filled 2024-09-27: qty 1

## 2024-09-27 NOTE — Progress Notes (Signed)
 OT Cancellation Note  Patient Details Name: Kobe Jansma MRN: [id number] DOB: June 11, 1976   Cancelled Treatment:    Reason Eval/Treat Not Completed: (P) Patient declined, HD today, tired, also on phone for a long time for arranging her move to [place] , will return tomorrow. Pt states he w/c will not be repaired in time for move and stated she will need one for return home.  Elouise JONELLE Bott 09/27/2024, 3:21 PM

## 2024-09-27 NOTE — Progress Notes (Signed)
 PROGRESS NOTE   Karen [name]  FMW:[id number]    DOB: 09/28/1976    DOA: 09/16/2024  PCP: Lesa Jon HERO, PA   I have briefly reviewed patients previous medical records in [facility].   Brief Hospital Course:  48 y.o. 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   Chronology   Admission 10/6-10/13  left foot transmetatarsal amputation and right foot third toe amputation on September 10, 2024  angiogram with [facility] angioplasty of the left popliteal artery  10/16  re-admit weakness fever chills last 2-3 days--noted discolored transmetatarsal flap.  Patient was started on empiric antibiotics.  10/18  angiography second-order cannulation L popliteal artery drug-coated balloon angioplasty 4X 60 cm-Dr. Silver 10/20 BKA performed per [facility] 10/24 low-grade fevers prompting us  to cancel the discharge on 10/24 and podiatry was reconsulted 10/25 ID consulted foot performed showing possible osteomyelitis third proximal phalanx no discernible drainable abscess-podiatry waiting back in  Assessment & Plan:   Infectious PAD diabetic polyneuropathy.  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 Malvin Area of concern noted and appreciated of both Dr. Lamount and Dr. Lindia input Antibiotics Augmentin that were resumed on 10/24 have been canceled Patient had previously completed cefepime vancomycin  09/11/2015-10/22 Better pain control Tylenol  650 every 6 as needed Oxy IR 10 Q3 as needed severe Oxy IR 5 every 3 as needed moderate, Dilaudid  for very severe pain ----- gabapentin  100 at bedtime gabapentin  200 daily --methocarbamol 500 every 6 as needed additionally I had a coordinated visit with Dr. Malvin of podiatry we both looked at the wounds and agree that it looks very clean and healing well Patient is not ready for  discharge-we have to observe her over the next several days to ensure no fever-if she has a fever then she definitely needs an amputation  BKA 09/2019 25 Dr. Lonni Gaskins Area of concern as above-her stump looked clean the last time I checked BMI 30 Type 2 diabetes mellitus A1c 6.5-using insulin  pump with polyneuropathy CBGs 132-150 Currently on the insulin  pump and we will follow ESRD MWF Peritoneal access immature at this time hypokalemia on admission Anemia renal disease I reviewed the abdominal access for the peritoneal catheter today and it looks quite clean this covered well and it is flushing well according to nursing Continue Fosrenol  1000 snack, 2000 3 times daily meal-phosphorus to be aggressively managed as per renal Watch hemoglobin- Not a candidate for IV iron May need Aranesp  Right hip pain  DVT prophylaxis: SCD's Start: 09/20/24 1016    Code Status: Full Code:  Family Communication: None at bedside. Disposition:  Status is: Inpatient   Unclear at this time--- it sounds like she is trying to transfer up to [place]  to be with family I am not sure the status of that-she needs to be monitored in the hospital for recurrence of fever and maybe have another surgery NOT READY FOR DISCHARGE  Subjective:   Had a better night seen on HD unit No chest pain No nausea no vomiting  Objective:   Vitals:   09/27/24 0810 09/27/24 0814 09/27/24 0830 09/27/24 0900  BP: (!) 157/74 (!) 159/80 (!) 141/75 136/76  Pulse: 84 87 85 81  Resp: 10 (!) 26 15 19   Temp: 98.3 F (36.8 C)     TempSrc:      SpO2: 99% 99% 96% 95%  Weight: 87.2  kg     Height:        Awake coherent no distress EOMI NCAT no focal deficit no icterus no pallor S1-S2 no murmur nsr  chest clear no added sound abdomen soft no rebound \ Wound exams on 10/27     Data Reviewed:   I have personally reviewed following labs and imaging studies   CBC: Recent Labs  Lab 09/24/24 0955  09/25/24 0519 09/26/24 0428  WBC 12.4* 14.8* 16.8*  NEUTROABS  --  10.6* 12.7*  HGB 7.4* 7.8* 8.0*  HCT 23.8* 24.5* 25.1*  MCV 94.8 94.6 92.3  PLT 353 354 358    Basic Metabolic Panel: Recent Labs  Lab 09/21/24 0410 09/22/24 0521 09/24/24 0955 09/25/24 0519 09/26/24 0428 09/27/24 0441  NA 135 134* 131* 130* 131* 132*  K 4.8 5.6* 4.8 4.2 4.7 5.4*  CL 94* 91* 90* 89* 88* 89*  CO2 26 22 23 27 25  20*  GLUCOSE 153* 118* 256* 168* 154* 142*  BUN 41* 58* 44* 28* 45* 57*  CREATININE 6.07* 7.83* 7.17* 4.89* 6.95* 8.74*  CALCIUM  9.1 8.9 8.2* 8.0* 8.3* 8.3*  PHOS 5.8* 7.3* 6.1*  --   --  7.1*    Liver Function Tests: Recent Labs  Lab 09/21/24 0410 09/22/24 0521 09/24/24 0955  ALBUMIN  2.4* 2.5* 2.2*    CBG: Recent Labs  Lab 09/26/24 2039 09/27/24 0155 09/27/24 0919  GLUCAP 162* 141* 132*     Scheduled Meds:    (feeding supplement) PROSource Plus  30 mL Oral BID BM   aspirin  EC  81 mg Oral Daily   brimonidine  1 drop Both Eyes TID   Chlorhexidine  Gluconate Cloth  6 each Topical Q0600   cinacalcet   90 mg Oral Q breakfast   gabapentin   100 mg Oral QHS   gabapentin   200 mg Oral Daily   insulin  pump   Subcutaneous TID WC, HS, 0200   Lanthanum  Carbonate  2,000 mg Oral TID WC   metoprolol  tartrate  12.5 mg Oral 2 times per day on Sunday Tuesday Thursday Saturday   pantoprazole  40 mg Oral Daily   senna  1 tablet Oral Daily   sorbitol  30 mL Oral Daily    LOS: 11 days   Jai-Gurmukh Marland Reine, MD,   09/27/2024, 9:47 AM

## 2024-09-27 NOTE — Progress Notes (Signed)
 Subjective: No new complaints   Antibiotics:  Anti-infectives (From admission, onward)    Start     Dose/Rate Route Frequency Ordered Stop   09/24/24 2200  amoxicillin-clavulanate (AUGMENTIN) 875-125 MG per tablet 1 tablet  Status:  Discontinued        1 tablet Oral Every 12 hours 09/24/24 1852 09/24/24 1857   09/24/24 2000  amoxicillin-clavulanate (AUGMENTIN) 500-125 MG per tablet 1 tablet  Status:  Discontinued        1 tablet Oral Every 24 hours 09/24/24 1857 09/26/24 1018   09/24/24 1200  vancomycin  (VANCOREADY) IVPB 750 mg/150 mL  Status:  Discontinued        750 mg 150 mL/hr over 60 Minutes Intravenous Every M-W-F (Hemodialysis) 09/23/24 0837 09/23/24 1556   09/21/24 1025  vancomycin  variable dose per unstable renal function (pharmacist dosing)  Status:  Discontinued         Does not apply See admin instructions 09/21/24 1025 09/23/24 1556   09/20/24 1200  vancomycin  (VANCOCIN ) IVPB 1000 mg/200 mL premix  Status:  Discontinued        1,000 mg 200 mL/hr over 60 Minutes Intravenous Every M-W-F (Hemodialysis) 09/19/24 1014 09/21/24 1025   09/17/24 2200  ceFEPIme (MAXIPIME) 1 g in sodium chloride  0.9 % 100 mL IVPB  Status:  Discontinued        1 g 200 mL/hr over 30 Minutes Intravenous Every 24 hours 09/16/24 2213 09/23/24 1556   09/17/24 1530  vancomycin  (VANCOCIN ) IVPB 1000 mg/200 mL premix        1,000 mg 200 mL/hr over 60 Minutes Intravenous  Once 09/17/24 1430 09/17/24 1721   09/16/24 1830  vancomycin  (VANCOREADY) IVPB 2000 mg/400 mL        2,000 mg 200 mL/hr over 120 Minutes Intravenous  Once 09/16/24 1815 09/16/24 2158   09/16/24 1830  ceFEPIme (MAXIPIME) 2 g in sodium chloride  0.9 % 100 mL IVPB        2 g 200 mL/hr over 30 Minutes Intravenous  Once 09/16/24 1815 09/16/24 1929       Medications: Scheduled Meds:  (feeding supplement) PROSource Plus  30 mL Oral BID BM   aspirin  EC  81 mg Oral Daily   brimonidine  1 drop Both Eyes TID   Chlorhexidine   Gluconate Cloth  6 each Topical Q0600   cinacalcet   90 mg Oral Q breakfast   gabapentin   100 mg Oral QHS   gabapentin   200 mg Oral Daily   insulin  pump   Subcutaneous TID WC, HS, 0200   Lanthanum  Carbonate  2,000 mg Oral TID WC   metoprolol  tartrate  12.5 mg Oral 2 times per day on Sunday Tuesday Thursday Saturday   pantoprazole  40 mg Oral Daily   senna  1 tablet Oral Daily   Continuous Infusions: PRN Meds:.acetaminophen  **OR** acetaminophen , albuterol , HYDROmorphone  (DILAUDID ) injection, lanthanum , methocarbamol, ondansetron  (ZOFRAN ) IV, mouth rinse, oxyCODONE , oxyCODONE , polyethylene glycol, promethazine, promethazine    Objective: Weight change:   Intake/Output Summary (Last 24 hours) at 09/27/2024 1732 Last data filed at 09/27/2024 1202 Gross per 24 hour  Intake 0 ml  Output 1400 ml  Net -1400 ml   Blood pressure (!) 164/85, pulse 84, temperature 98.4 F (36.9 C), temperature source Oral, resp. rate 17, height 5' 5 (1.651 m), weight 87 kg, SpO2 96%. Temp:  [98.2 F (36.8 C)-98.5 F (36.9 C)] 98.4 F (36.9 C) (10/27 1731) Pulse Rate:  [72-138] 84 (10/27 1731) Resp:  [  10-26] 17 (10/27 1731) BP: (105-164)/(60-89) 164/85 (10/27 1731) SpO2:  [87 %-100 %] 96 % (10/27 1731) Weight:  [87 kg-87.2 kg] 87 kg (10/27 1156)  Physical Exam: Physical Exam Constitutional:      General: She is not in acute distress.    Appearance: She is well-developed. She is not diaphoretic.  HENT:     Head: Normocephalic and atraumatic.     Right Ear: External ear normal.     Left Ear: External ear normal.     Mouth/Throat:     Pharynx: No oropharyngeal exudate.  Eyes:     General: No scleral icterus.    Conjunctiva/sclera: Conjunctivae normal.     Pupils: Pupils are equal, round, and reactive to light.  Cardiovascular:     Rate and Rhythm: Normal rate and regular rhythm.  Pulmonary:     Effort: Pulmonary effort is normal. No respiratory distress.     Breath sounds: No wheezing.   Abdominal:     General: There is no distension.     Palpations: Abdomen is soft.  Lymphadenopathy:     Cervical: No cervical adenopathy.  Skin:    General: Skin is warm and dry.     Coloration: Skin is not pale.     Findings: No erythema or rash.  Neurological:     General: No focal deficit present.     Mental Status: She is alert and oriented to person, place, and time.     Motor: No abnormal muscle tone.     Coordination: Coordination normal.  Psychiatric:        Mood and Affect: Mood normal.        Behavior: Behavior normal.        Thought Content: Thought content normal.        Judgment: Judgment normal.    BKA site wrapped right foot wrapped  CBC:    BMET Recent Labs    09/26/24 0428 09/27/24 0441  NA 131* 132*  K 4.7 5.4*  CL 88* 89*  CO2 25 20*  GLUCOSE 154* 142*  BUN 45* 57*  CREATININE 6.95* 8.74*  CALCIUM  8.3* 8.3*     Liver Panel  No results for input(s): PROT, ALBUMIN , AST, ALT, ALKPHOS, BILITOT, BILIDIR, IBILI in the last 72 hours.     Sedimentation Rate Recent Labs    09/26/24 1102  ESRSEDRATE >140*   C-Reactive Protein Recent Labs    09/26/24 1102  CRP 22.6*    Micro Results: Recent Results (from the past 720 hours)  Culture, blood (Routine x 2)     Status: None   Collection Time: 09/06/24  4:33 PM   Specimen: BLOOD RIGHT ARM  Result Value Ref Range Status   Specimen Description BLOOD RIGHT ARM  Final   Special Requests   Final    BOTTLES DRAWN AEROBIC AND ANAEROBIC Blood Culture adequate volume   Culture   Final    NO GROWTH 5 DAYS Performed at [facility], [address] [place], [place] [address]    Report Status 09/11/2024 FINAL  Final  Resp panel by RT-PCR (RSV, Flu A&B, Covid) Anterior Nasal Swab     Status: None   Collection Time: 09/06/24  6:54 PM   Specimen: Anterior Nasal Swab  Result Value Ref Range Status   SARS Coronavirus 2 by RT PCR NEGATIVE NEGATIVE Final   Influenza A by PCR  NEGATIVE NEGATIVE Final   Influenza B by PCR NEGATIVE NEGATIVE Final    Comment: (NOTE)  The Xpert Xpress SARS-CoV-2/FLU/RSV plus assay is intended as an aid in the diagnosis of influenza from Nasopharyngeal swab specimens and should not be used as a sole basis for treatment. Nasal washings and aspirates are unacceptable for Xpert Xpress SARS-CoV-2/FLU/RSV testing.  Fact Sheet for Patients: bloggercourse.com  Fact Sheet for Healthcare Providers: seriousbroker.it  This test is not yet approved or cleared by the [place]  FDA and has been authorized for detection and/or diagnosis of SARS-CoV-2 by FDA under an Emergency Use Authorization (EUA). This EUA will remain in effect (meaning this test can be used) for the duration of the COVID-19 declaration under Section 564(b)(1) of the Act, 21 U.S.C. section 360bbb-3(b)(1), unless the authorization is terminated or revoked.     Resp Syncytial Virus by PCR NEGATIVE NEGATIVE Final    Comment: (NOTE) Fact Sheet for Patients: bloggercourse.com  Fact Sheet for Healthcare Providers: seriousbroker.it  This test is not yet approved or cleared by the [place]  FDA and has been authorized for detection and/or diagnosis of SARS-CoV-2 by FDA under an Emergency Use Authorization (EUA). This EUA will remain in effect (meaning this test can be used) for the duration of the COVID-19 declaration under Section 564(b)(1) of the Act, 21 U.S.C. section 360bbb-3(b)(1), unless the authorization is terminated or revoked.  Performed at [facility], [address], [place], [place] [address]   Aerobic/Anaerobic Culture w Gram Stain (surgical/deep wound)     Status: None   Collection Time: 09/10/24  8:00 AM   Specimen: Foot, Left; Tissue  Result Value Ref Range Status   Specimen Description TISSUE  Final   Special Requests LEFT FOREFOOT  Final    Gram Stain NO WBC SEEN FEW GRAM POSITIVE COCCI IN PAIRS   Final   Culture   Final    ABUNDANT MORGANELLA MORGANII ABUNDANT PROTEUS MIRABILIS MODERATE ENTEROCOCCUS FAECALIS NO ANAEROBES ISOLATED Performed at [facility], [address], [place], [place] [address]    Report Status 09/17/2024 FINAL  Final   Organism ID, Bacteria MORGANELLA MORGANII  Final   Organism ID, Bacteria PROTEUS MIRABILIS  Final   Organism ID, Bacteria ENTEROCOCCUS FAECALIS  Final      Susceptibility   Enterococcus faecalis - MIC*    AMPICILLIN <=2 SENSITIVE Sensitive     VANCOMYCIN  1 SENSITIVE Sensitive     GENTAMICIN SYNERGY RESISTANT Resistant     * MODERATE ENTEROCOCCUS FAECALIS   Morganella morganii - MIC*    AMPICILLIN >=32 RESISTANT Resistant     ERTAPENEM <=0.12 SENSITIVE Sensitive     CIPROFLOXACIN  <=0.06 SENSITIVE Sensitive     GENTAMICIN <=1 SENSITIVE Sensitive     MEROPENEM <=0.25 SENSITIVE Sensitive     TRIMETH/SULFA <=20 SENSITIVE Sensitive     AMPICILLIN/SULBACTAM >=32 RESISTANT Resistant     PIP/TAZO Value in next row Sensitive      <=4 SENSITIVEThis is a modified FDA-approved test that has been validated and its performance characteristics determined by the reporting laboratory.  This laboratory is certified under the Clinical Laboratory Improvement Amendments CLIA as qualified to perform high complexity clinical laboratory testing.    * ABUNDANT MORGANELLA MORGANII   Proteus mirabilis - MIC*    AMPICILLIN Value in next row Sensitive      <=4 SENSITIVEThis is a modified FDA-approved test that has been validated and its performance characteristics determined by the reporting laboratory.  This laboratory is certified under the Clinical Laboratory Improvement Amendments CLIA as qualified to perform high complexity clinical laboratory testing.  CEFAZOLIN  (NON-URINE) Value in next row Intermediate      <=4 SENSITIVEThis is a modified FDA-approved test that has been validated and its  performance characteristics determined by the reporting laboratory.  This laboratory is certified under the Clinical Laboratory Improvement Amendments CLIA as qualified to perform high complexity clinical laboratory testing.    CEFEPIME Value in next row Sensitive      <=4 SENSITIVEThis is a modified FDA-approved test that has been validated and its performance characteristics determined by the reporting laboratory.  This laboratory is certified under the Clinical Laboratory Improvement Amendments CLIA as qualified to perform high complexity clinical laboratory testing.    ERTAPENEM Value in next row Sensitive      <=4 SENSITIVEThis is a modified FDA-approved test that has been validated and its performance characteristics determined by the reporting laboratory.  This laboratory is certified under the Clinical Laboratory Improvement Amendments CLIA as qualified to perform high complexity clinical laboratory testing.    CEFTRIAXONE  Value in next row Sensitive      <=4 SENSITIVEThis is a modified FDA-approved test that has been validated and its performance characteristics determined by the reporting laboratory.  This laboratory is certified under the Clinical Laboratory Improvement Amendments CLIA as qualified to perform high complexity clinical laboratory testing.    CIPROFLOXACIN  Value in next row Sensitive      <=4 SENSITIVEThis is a modified FDA-approved test that has been validated and its performance characteristics determined by the reporting laboratory.  This laboratory is certified under the Clinical Laboratory Improvement Amendments CLIA as qualified to perform high complexity clinical laboratory testing.    GENTAMICIN Value in next row Sensitive      <=4 SENSITIVEThis is a modified FDA-approved test that has been validated and its performance characteristics determined by the reporting laboratory.  This laboratory is certified under the Clinical Laboratory Improvement Amendments CLIA as qualified  to perform high complexity clinical laboratory testing.    MEROPENEM Value in next row Sensitive      <=4 SENSITIVEThis is a modified FDA-approved test that has been validated and its performance characteristics determined by the reporting laboratory.  This laboratory is certified under the Clinical Laboratory Improvement Amendments CLIA as qualified to perform high complexity clinical laboratory testing.    TRIMETH/SULFA Value in next row Resistant      <=4 SENSITIVEThis is a modified FDA-approved test that has been validated and its performance characteristics determined by the reporting laboratory.  This laboratory is certified under the Clinical Laboratory Improvement Amendments CLIA as qualified to perform high complexity clinical laboratory testing.    AMPICILLIN/SULBACTAM Value in next row Sensitive      <=4 SENSITIVEThis is a modified FDA-approved test that has been validated and its performance characteristics determined by the reporting laboratory.  This laboratory is certified under the Clinical Laboratory Improvement Amendments CLIA as qualified to perform high complexity clinical laboratory testing.    PIP/TAZO Value in next row Sensitive      <=4 SENSITIVEThis is a modified FDA-approved test that has been validated and its performance characteristics determined by the reporting laboratory.  This laboratory is certified under the Clinical Laboratory Improvement Amendments CLIA as qualified to perform high complexity clinical laboratory testing.    * ABUNDANT PROTEUS MIRABILIS  Culture, blood (Routine x 2)     Status: None   Collection Time: 09/16/24  5:10 PM   Specimen: BLOOD RIGHT ARM  Result Value Ref Range Status   Specimen Description BLOOD RIGHT ARM  Final  Special Requests   Final    BOTTLES DRAWN AEROBIC AND ANAEROBIC Blood Culture results may not be optimal due to an inadequate volume of blood received in culture bottles   Culture   Final    NO GROWTH 5 DAYS Performed at  [facility], [address], [place], [place] [address]    Report Status 09/21/2024 FINAL  Final  Culture, blood (Routine x 2)     Status: None   Collection Time: 09/16/24  5:42 PM   Specimen: BLOOD RIGHT ARM  Result Value Ref Range Status   Specimen Description BLOOD RIGHT ARM  Final   Special Requests   Final    BOTTLES DRAWN AEROBIC AND ANAEROBIC Blood Culture results may not be optimal due to an inadequate volume of blood received in culture bottles   Culture   Final    NO GROWTH 5 DAYS Performed at [facility], [address] [place], [place] [address]    Report Status 09/21/2024 FINAL  Final  Body fluid culture w Gram Stain     Status: None   Collection Time: 09/16/24  9:07 PM   Specimen: Peritoneal Washings; Body Fluid  Result Value Ref Range Status   Specimen Description PERITONEAL FLUID  Final   Special Requests Immunocompromised  Final   Gram Stain NO ORGANISMS SEEN NO WBC SEEN CYTOSPIN SMEAR   Final   Culture   Final    NO GROWTH 3 DAYS Performed at [facility], [address], [place], [place] [address]    Report Status 09/20/2024 FINAL  Final  MRSA Next Gen by PCR, Nasal     Status: None   Collection Time: 09/17/24  4:48 AM   Specimen: Nasal Mucosa; Nasal Swab  Result Value Ref Range Status   MRSA by PCR Next Gen NOT DETECTED NOT DETECTED Final    Comment: (NOTE) The GeneXpert MRSA Assay (FDA approved for NASAL specimens only), is one component of a comprehensive MRSA colonization surveillance program. It is not intended to diagnose MRSA infection nor to guide or monitor treatment for MRSA infections. Test performance is not FDA approved in patients less than 7 years old. Performed at [facility], [address] [place], [place] [address]   Culture, blood (Routine X 2) w Reflex to ID Panel     Status: None (Preliminary result)   Collection Time: 09/25/24  5:19 AM   Specimen: BLOOD RIGHT HAND  Result Value Ref Range Status    Specimen Description BLOOD RIGHT HAND  Final   Special Requests   Final    BOTTLES DRAWN AEROBIC AND ANAEROBIC Blood Culture adequate volume   Culture   Final    NO GROWTH 2 DAYS Performed at [facility], [address] [place], [place] [address]    Report Status PENDING  Incomplete  Culture, blood (Routine X 2) w Reflex to ID Panel     Status: None (Preliminary result)   Collection Time: 09/25/24  5:22 AM   Specimen: BLOOD RIGHT ARM  Result Value Ref Range Status   Specimen Description BLOOD RIGHT ARM  Final   Special Requests   Final    BOTTLES DRAWN AEROBIC AND ANAEROBIC Blood Culture adequate volume   Culture   Final    NO GROWTH 2 DAYS Performed at [facility], [address], [place], [place] [address]    Report Status PENDING  Incomplete    Studies/Results: No results found.  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  During readmission she was treated cefepime and vancomycin  to the 16th and was going to be discharged and had low-grade fevers and was placed on oral Augmentin MRI of the foot was repeated which showed findings concerning for osteomyelitis in the head of the distal third proximal phalanx where she had had the amputation.  The MRI feeding reading is course difficult in the sense that she recently had an amputation and this findings could be consistent with surgery or osteomyelitis that is still remaining.  Her inflammatory markers in particular her sed rate which is greater than 140 now versus 114 in early October is disconcerting, ERP is now up from normal at 0.9-22.6  I personally think she has residual infection in this foot.  One question is does she need revascularization on right side.  Reasonable to refer her off antibiotics as per Dr. Emmitt plan but again my anxiety is she still has residual osteomyelitis here  I have personally spent 50 minutes involved in face-to-face and non-face-to-face activities for this patient on the day of the visit. Professional time spent includes the following activities: Preparing to see the patient (review of tests), Obtaining and/or reviewing separately obtained history (admission/discharge record), Performing a medically appropriate examination and/or evaluation , Ordering medications/tests/procedures, referring and communicating with other health care professionals, Documenting clinical information in the EMR, Independently interpreting results (not separately reported), Communicating results to the patient/family/caregiver, Counseling and educating the  patient/family/caregiver and Care coordination (not separately reported).   Evaluation of the patient requires complex antimicrobial therapy evaluation, counseling , isolation needs to reduce disease transmission and risk assessment and mitigation.      LOS: 11 days   Karen [name] 09/27/2024, 5:32 PM

## 2024-09-27 NOTE — Progress Notes (Signed)
 1.4 liters ultrafiltration, condition stable and report was given to the primary RN.

## 2024-09-27 NOTE — Progress Notes (Signed)
 PD In and out flushing done on aseptic/sterile set-up. Patient is aware that we're infusing 1/2 bag 3 liters since we don't have weight scale,and she agreed. Drained out of clear effluent ,no fibrin of 1/2 of 3 liters bag.New dressing applied . Gentamicin antibiotic ordered.

## 2024-09-27 NOTE — Progress Notes (Signed)
  [facility] Progress Note   Subjective:    Seen and examined pt in HD unit No c/o's today  Objective Vitals:   09/27/24 1135 09/27/24 1155 09/27/24 1156 09/27/24 1202  BP: (!) 145/75 105/60 118/65 123/72  Pulse: 84 79 79 79  Resp: 13 (!) 22 19 17   Temp:    98.4 F (36.9 C)  TempSrc:      SpO2: 90% 99% (!) 87% (!) 87%  Weight:   87 kg   Height:       Physical Exam General: Alert female in NAD, on RA Heart: RRR, no murmurs, rubs or gallops Lungs: CTA bilaterally, respirations unlabored Abdomen: Soft, non-distended, +BS Extremities: L BKA, R foot wrapped, no edema appreciated Dialysis Access: AVF + t/b, PD cath in abdomen   Dialysis Orders: MWF [facility]-->  4h  B400   90kg 2K bath  AVF  Heparin  none  Assessment/Plan: 1.  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 Issues in R foot as well.  2. ESRD: Continue MWF schedule, next HD today.  She has a PD cath but has not started PD yet, PD cath flushed 10/24.  3. Anemia: Hgb 9.4-improving. Will continue to follow trend and decide on need for ESA redosing 4. CKD-MBD: Calcium  level variable, not on VDRA. Phos variable. Continue lanthanum . On sensipar  for PTH 6. Hypertension: Blood pressure currently controlled   Rob Geralynn  MD  [facility] 09/27/2024, 1:03 PM  Recent Labs  Lab 09/22/24 0521 09/24/24 0955 09/25/24 0519 09/26/24 0428 09/27/24 0441  HGB 9.4* 7.4* 7.8* 8.0*  --   ALBUMIN  2.5* 2.2*  --   --   --   CALCIUM  8.9 8.2* 8.0* 8.3* 8.3*  PHOS 7.3* 6.1*  --   --  7.1*  CREATININE 7.83* 7.17* 4.89* 6.95* 8.74*  K 5.6* 4.8 4.2 4.7 5.4*    Inpatient medications:  (feeding supplement) PROSource Plus  30 mL Oral BID BM   aspirin  EC  81 mg Oral Daily   brimonidine  1 drop Both Eyes TID   Chlorhexidine  Gluconate Cloth  6 each Topical Q0600   cinacalcet   90 mg Oral Q breakfast   gabapentin   100 mg  Oral QHS   gabapentin   200 mg Oral Daily   insulin  pump   Subcutaneous TID WC, HS, 0200   Lanthanum  Carbonate  2,000 mg Oral TID WC   metoprolol  tartrate  12.5 mg Oral 2 times per day on Sunday Tuesday Thursday Saturday   pantoprazole  40 mg Oral Daily   senna  1 tablet Oral Daily    acetaminophen  **OR** acetaminophen , albuterol , HYDROmorphone  (DILAUDID ) injection, lanthanum , methocarbamol, ondansetron  (ZOFRAN ) IV, mouth rinse, oxyCODONE , oxyCODONE , polyethylene glycol, promethazine, promethazine

## 2024-09-27 NOTE — Progress Notes (Addendum)
 Contacted [facility], informed that pt is still here. [facility] requested a hep b core lab be drawn, nephrology informed. Will continue to assist as needed.   [facility] [phone]  Addendum 12:56pm Contacted by SW regarding possible need to transfer clinics. Contacted out-pt HD [facility], they stated they have been aware and have already started this referral process. They stated that pt agreed and is knowledgable that she is to return at [facility] after discharge, while this is being set up. When asked, nothing is needed from navigator at this time. SW and nephrology informed.

## 2024-09-27 NOTE — Progress Notes (Signed)
 Subjective:  Patient ID: Karen [name], female    DOB: January 21, 1976,  MRN: [id number]  Chief Complaint  Patient presents with   Post-op Problem    DOS: 09/10/2024 Procedure: 1. Amputation of right third toe at PIPJ level 2. Transmetatarsal amputation of left foot  48 y.o. female seen for post op check.  She is now s/p L BKA. She is 2 weeks out from the right foot partial 3rd toe amputation. Concern for low grade fevers late last week and residual OM at the amp site 3rd toe, though difficult to discern from other post surgical changes.  Review of Systems: Negative except as noted in the HPI. Denies N/V/F/Ch.   Objective:   Constitutional Well developed. Well nourished.  Vascular Foot warm and well perfused. Capillary refill normal to all digits.   No calf pain with palpation  Neurologic Normal speech. Oriented to person, place, and time. Epicritic sensation diminished to bilateral forefoot  Dermatologic Right foot amputation site at the third toe PIPJ level is doing very well healing well with no dehiscence and no drainage no erythema and no necrosis, no maceration. Dorsal wound healing.            Orthopedic: Status post left foot transmetatarsal amputation, status post right partial third toe amputation   Radiographs: Right foot postop: 1. Interval surgical amputation of the third middle and distal phalanges. 2. Stable postsurgical changes involving the fifth metatarsal. 3. Stable chronic changes of the midfoot. 4. Vascular calcifications.  Left foot postop: 1. Status post amputation of all five toes at the mid metatarsal level. 2. Vascular calcifications.    Pathology:  A. TOE, RIGHT THIRD, AMPUTATION:  - Toe, clinically right third, showing skin with ulceration and  associated necrotizing inflammation  - Moderate to severe calcific atherosclerosis of small to medium-caliber arteries  - Bone at the resection margin does not show evidence of acute   osteomyelitis   Micro:   ABUNDANT MORGANELLA MORGANII ABUNDANT PROTEUS MIRABILIS    Assessment:   1. Sepsis, due to unspecified organism, unspecified whether acute organ dysfunction present (HCC)   2. 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  2 weeks  s/p left foot transmetatarsal amputation and partial right third toe amputation.  s/p L BKA with vascular.  - Reviewed with Dr. Samtani today and also with patient  re concern for possible residual OM - Right third toe continues to improve heal as expected. No erythema or edema or maceration at the amp site. Agree best course is to monitor for another day or two and if fevers/ source control further concern would consider removal of remaining third toe.  -  Recommend we do 2x weekly dressing changes with HH - this will now change to steri strips or band aid style adhesive dressing, no longer need for bulky xeroform dressing.  -XR: Expected postop changes -WB Status:Weightbearing as tolerated to right foot in postop shoe -Sutures: Previously removed, no dehiscence   -Medications/ABX: Abx per ID -Dressing: appreciate HH - 2x weekly dressing changes with steri strips or band aid. Ok to wash the right foot prn at this time. - F/u Plan: Patient will follow-up in the office in 2 weeks - she is moving to [facility] when has HD arranged - I have recommend she follow up with [facility]  [place] [facility], will try to have our office send a referral for her to be seen within 2 weeks of discharge.         Karen [name], DPM [facility] / [facility]

## 2024-09-28 ENCOUNTER — Encounter (HOSPITAL_COMMUNITY)
Admission: EM | Disposition: A | Discharge status: Home Health | Payer: Self-pay | Source: Home / Self Care | Attending: Family Medicine

## 2024-09-28 DIAGNOSIS — E11628 Type 2 diabetes mellitus with other skin complications: Secondary | ICD-10-CM | POA: Diagnosis not present

## 2024-09-28 DIAGNOSIS — N186 End stage renal disease: Secondary | ICD-10-CM | POA: Diagnosis not present

## 2024-09-28 DIAGNOSIS — A499 Bacterial infection, unspecified: Secondary | ICD-10-CM

## 2024-09-28 DIAGNOSIS — M86372 Chronic multifocal osteomyelitis, left ankle and foot: Secondary | ICD-10-CM | POA: Diagnosis not present

## 2024-09-28 DIAGNOSIS — I96 Gangrene, not elsewhere classified: Secondary | ICD-10-CM | POA: Diagnosis not present

## 2024-09-28 LAB — CBC WITH DIFFERENTIAL/PLATELET
Abs Immature Granulocytes: 0.08 K/uL — ABNORMAL HIGH (ref 0.00–0.07)
Basophils Absolute: 0.1 K/uL (ref 0.0–0.1)
Basophils Relative: 0 %
Eosinophils Absolute: 0.3 K/uL (ref 0.0–0.5)
Eosinophils Relative: 2 %
HCT: 24.8 % — ABNORMAL LOW (ref 36.0–46.0)
Hemoglobin: 7.6 g/dL — ABNORMAL LOW (ref 12.0–15.0)
Immature Granulocytes: 1 %
Lymphocytes Relative: 17 %
Lymphs Abs: 2.4 K/uL (ref 0.7–4.0)
MCH: 28.7 pg (ref 26.0–34.0)
MCHC: 30.6 g/dL (ref 30.0–36.0)
MCV: 93.6 fL (ref 80.0–100.0)
Monocytes Absolute: 1.2 K/uL — ABNORMAL HIGH (ref 0.1–1.0)
Monocytes Relative: 9 %
Neutro Abs: 10.1 K/uL — ABNORMAL HIGH (ref 1.7–7.7)
Neutrophils Relative %: 71 %
Platelets: 387 K/uL (ref 150–400)
RBC: 2.65 MIL/uL — ABNORMAL LOW (ref 3.87–5.11)
RDW: 13.6 % (ref 11.5–15.5)
WBC: 14.2 K/uL — ABNORMAL HIGH (ref 4.0–10.5)
nRBC: 0 % (ref 0.0–0.2)

## 2024-09-28 LAB — BASIC METABOLIC PANEL WITH GFR
Anion gap: 12 (ref 5–15)
BUN: 41 mg/dL — ABNORMAL HIGH (ref 6–20)
CO2: 27 mmol/L (ref 22–32)
Calcium: 8.7 mg/dL — ABNORMAL LOW (ref 8.9–10.3)
Chloride: 93 mmol/L — ABNORMAL LOW (ref 98–111)
Creatinine, Ser: 6.07 mg/dL — ABNORMAL HIGH (ref 0.44–1.00)
GFR, Estimated: 8 mL/min — ABNORMAL LOW (ref >60)
Glucose, Bld: 197 mg/dL — ABNORMAL HIGH (ref 70–99)
Potassium: 3.8 mmol/L (ref 3.5–5.1)
Sodium: 132 mmol/L — ABNORMAL LOW (ref 135–145)

## 2024-09-28 LAB — GLUCOSE, CAPILLARY
Glucose-Capillary: 198 mg/dL — ABNORMAL HIGH (ref 70–99)
Glucose-Capillary: 112 mg/dL — ABNORMAL HIGH (ref 70–99)
Glucose-Capillary: 186 mg/dL — ABNORMAL HIGH (ref 70–99)
Glucose-Capillary: 173 mg/dL — ABNORMAL HIGH (ref 70–99)
Glucose-Capillary: 181 mg/dL — ABNORMAL HIGH (ref 70–99)

## 2024-09-28 LAB — HEPATITIS B SURFACE ANTIBODY, QUANTITATIVE: Hep B S AB Quant (Post): 91.3 m[IU]/mL

## 2024-09-28 LAB — HEPATITIS B CORE ANTIBODY, TOTAL: HEP B CORE AB: NEGATIVE

## 2024-09-28 SURGERY — AMPUTATION, TOE
Anesthesia: General | Site: Toe | Laterality: Right

## 2024-09-28 MED ORDER — CHLORHEXIDINE GLUCONATE CLOTH 2 % EX PADS: 6.0000 | MEDICATED_PAD | Freq: Every day | CUTANEOUS | Status: DC

## 2024-09-28 MED ORDER — POLYETHYLENE GLYCOL 3350 17 G PO PACK
17.0000 g | PACK | Freq: Two times a day (BID) | ORAL | Status: DC
  Administered 2024-09-28 – 2024-09-29 (×2): 17 g via ORAL
  Filled 2024-09-28 (×2): qty 1

## 2024-09-28 MED ORDER — FLEET ENEMA RE ENEM
1.0000 | ENEMA | Freq: Once | RECTAL | Status: AC
  Administered 2024-09-28: 1 via RECTAL
  Filled 2024-09-28: qty 1

## 2024-09-28 MED ORDER — DARBEPOETIN ALFA 100 MCG/0.5ML IJ SOSY
100.0000 ug | PREFILLED_SYRINGE | INTRAMUSCULAR | Status: DC
  Administered 2024-09-28: 100 ug via SUBCUTANEOUS
  Filled 2024-09-28: qty 0.5

## 2024-09-28 MED ORDER — FLUCONAZOLE 150 MG PO TABS
150.0000 mg | ORAL_TABLET | Freq: Once | ORAL | Status: AC
  Administered 2024-09-28: 150 mg via ORAL
  Filled 2024-09-28: qty 1

## 2024-09-28 NOTE — Progress Notes (Signed)
 Pt lethargic but arousable this morning after requesting all her prn meds on third shift. When the next dose was due by setting her phone alarm to wake her up so she would not miss her next dose, I saw her set her alarm on her phone as I was walking out her room this morning.

## 2024-09-28 NOTE — Progress Notes (Signed)
 PROGRESS NOTE   Karen [name]  FMW:[id number]    DOB: 03-19-1976    DOA: 09/16/2024  PCP: Lesa Jon HERO, PA    Brief Hospital Course:  48 y.o. 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   Chronology   Admission 10/6-10/13  left foot transmetatarsal amputation and right foot third toe amputation on September 10, 2024  angiogram with [facility] angioplasty of the left popliteal artery  10/16  re-admit weakness fever chills last 2-3 days--noted discolored transmetatarsal flap.  Patient was started on empiric antibiotics.  10/18  angiography second-order cannulation L popliteal artery drug-coated balloon angioplasty 4X 60 cm-Dr. Silver 10/20 BKA performed per [facility] 10/24 low-grade fevers prompting us  to cancel the discharge on 10/24 and podiatry was reconsulted 10/25 ID consulted foot performed showing possible osteomyelitis third proximal phalanx no discernible drainable abscess 10/27 continues to have low-grade temps in the evening  Assessment & Plan:   Infectious PAD diabetic polyneuropathy.  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 Malvin Area of concern noted and appreciated of both Dr. Lamount and Dr. Lindia input Antibiotics Augmentin that were resumed on 10/24 have been canceled Patient had previously completed cefepime vancomycin  09/11/2015-10/22 Better pain control Tylenol  650 every 6 as needed Oxy IR 10 Q3 as needed severe Oxy IR 5 every 3 as needed moderate, Dilaudid  for very severe pain ----- gabapentin  100 at bedtime gabapentin  200 daily --methocarbamol 500 every 6 as needed additionally Scenario discussed 10/27 with Dr. Malvin who will review the patient and probably patient will need a revision amputation BKA 09/2019 25 Dr. Lonni Gaskins Area of concern as above-her stump looked clean the last  time I checked Vascular to address dressing changes etc. BMI 30 Type 2 diabetes mellitus A1c 6.5-using insulin  pump with polyneuropathy CBGs 132-150 Currently on the insulin  pump and we will follow ESRD MWF Peritoneal access immature at this time hypokalemia on admission Anemia renal disease I reviewed the abdominal access for the peritoneal catheter today and it looks quite clean this covered well and it is flushing well according to nursing Continue Fosrenol  1000 snack, 2000 3 times daily meal-phosphorus to be aggressively managed as per renal Watch hemoglobin- Not a candidate for IV iron May need Aranesp  Nausea 10/27 Probably secondary to not being able to stool-scheduled her MiraLAX to twice daily (it was as needed) she can get senna this evening and we will give another phosphate enema Right hip pain  DVT prophylaxis: SCD's Start: 09/20/24 1016    Code Status: Full Code:  Family Communication: None at bedside. Disposition:  Status is: Inpatient   Unclear at this time--- it sounds like she is trying to transfer up to [place]  to be with family  Plan is to get dialysis here locally until that time then she can go to [place]  and set up with brother who lives there  Subjective:   Emesis this afternoon-requesting medication given some Phenergan  Objective:   Vitals:   09/27/24 2045 09/28/24 0514 09/28/24 1100 09/28/24 1653  BP: 133/75 124/78 (!) 189/84 139/76  Pulse: 79 74 83 78  Resp: 18 18  18   Temp: 99.1 F (37.3 C) 98.9 F (37.2 C)  98.8 F (37.1 C)  TempSrc: Oral Oral    SpO2: 99% 100% 100%   Weight:      Height:        Awake coherent  no distress EOMI NCAT no focal deficit no icterus no pallor S1-S2 no murmur nsr Fistula seems intact  chest clear no added sound abdomen soft no rebound  Wound exams on 10/27--have not examined left BKA     Data Reviewed:   I have personally reviewed following labs and imaging studies   CBC: Recent Labs  Lab  Sep 29, 2024 0428 09/27/24 1332 09/28/24 0419  WBC 16.8* 14.2* 14.2*  NEUTROABS 12.7* 10.9* 10.1*  HGB 8.0* 7.6* 7.6*  HCT 25.1* 24.1* 24.8*  MCV 92.3 92.7 93.6  PLT 358 346 387    Basic Metabolic Panel: Recent Labs  Lab 09/22/24 0521 09/24/24 0955 09/25/24 0519 09/29/2024 0428 09/27/24 0441 09/28/24 0419  NA 134* 131* 130* 131* 132* 132*  K 5.6* 4.8 4.2 4.7 5.4* 3.8  CL 91* 90* 89* 88* 89* 93*  CO2 22 23 27 25  20* 27  GLUCOSE 118* 256* 168* 154* 142* 197*  BUN 58* 44* 28* 45* 57* 41*  CREATININE 7.83* 7.17* 4.89* 6.95* 8.74* 6.07*  CALCIUM  8.9 8.2* 8.0* 8.3* 8.3* 8.7*  PHOS 7.3* 6.1*  --   --  7.1*  --     Liver Function Tests: Recent Labs  Lab 09/22/24 0521 09/24/24 0955  ALBUMIN  2.5* 2.2*    CBG: Recent Labs  Lab 09/28/24 0820 09/28/24 1141 09/28/24 1549  GLUCAP 112* 186* 173*     Scheduled Meds:    (feeding supplement) PROSource Plus  30 mL Oral BID BM   aspirin  EC  81 mg Oral Daily   brimonidine  1 drop Both Eyes TID   Chlorhexidine  Gluconate Cloth  6 each Topical Q0600   [START ON 09/29/2024] Chlorhexidine  Gluconate Cloth  6 each Topical Q0600   cinacalcet   90 mg Oral Q breakfast   darbepoetin (ARANESP ) injection - DIALYSIS  100 mcg Subcutaneous Q Tue-1800   gabapentin   100 mg Oral QHS   gabapentin   200 mg Oral Daily   gentamicin cream   Topical Once   insulin  pump   Subcutaneous TID WC, HS, 0200   Lanthanum  Carbonate  2,000 mg Oral TID WC   metoprolol  tartrate  12.5 mg Oral 2 times per day on Sunday Tuesday Thursday Saturday   pantoprazole  40 mg Oral Daily   polyethylene glycol  17 g Oral BID   senna  1 tablet Oral Daily    LOS: 12 days   Jai-Gurmukh Jalin Alicea, MD,   09/28/2024, 5:36 PM

## 2024-09-28 NOTE — Progress Notes (Signed)
 Physical Therapy Treatment Patient Details Name: Karen [name] MRN: [id number] DOB: 10-29-1976 Today's Date: 09/28/2024   History of Present Illness Karen [name] is a 48 y.o. female admitted on 09/16/24 with infected left diabetic foot wound. 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    PT Comments  Continuing work on functional mobility and activity tolerance; Session focused on pre-prosthesis education, specifically stretching hip flexors and strengthening hip extensors; performed 5 reps of bolstered bridging with good form -- will work towards more hip extensor power and muscle endurance; Good performance of lateral scoot transfer with sliding board; Noting Podiatry is considering further intervention LLE     If plan is discharge home, recommend the following: A little help with walking and/or transfers;A little help with bathing/dressing/bathroom;Assistance with cooking/housework;Assist for transportation;Help with stairs or ramp for entrance   Can travel by private vehicle     Yes  Equipment Recommendations  Wheelchair cushion (measurements PT) (Already has wheelchair, needs wheelchair cushion)    Recommendations for Other Services       Precautions / Restrictions Precautions Precautions: Fall Recall of Precautions/Restrictions: Intact Restrictions RLE Weight Bearing Per Provider Order: Weight bearing as tolerated LLE Weight Bearing Per Provider Order: Non weight bearing Other Position/Activity Restrictions: WBAT R foot with post op shoe, NWB LLE with limb protector     Mobility  Bed Mobility Overal bed mobility: Modified Independent             General bed mobility  comments: Increased time but no assist required.    Transfers Overall transfer level: Needs assistance Equipment used: Sliding board Transfers: Bed to chair/wheelchair/BSC            Lateral/Scoot Transfers: Min assist General transfer comment: Able to verbalize technique for board placement; cues for hand placement; good push up with UEs and use of RLE as a pivot point    Ambulation/Gait               General Gait Details: Deferred due to pain/pt request   Stairs             Wheelchair Mobility     Tilt Bed    Modified Rankin (Stroke Patients Only)       Balance     Sitting balance-Leahy Scale: Good                                      Communication Communication Communication: No apparent difficulties  Cognition Arousal: Alert Behavior During Therapy: WFL for tasks assessed/performed   PT - Cognitive impairments: No apparent impairments                         Following commands: Intact      Cueing Cueing Techniques: Verbal cues, Gestural cues  Exercises Amputee Exercises Chair Push Up: AROM (2 reps) Other Exercises Other Exercises: Bolstered bridging x5; min assist to steady bolsters under LLE; Able ot perform 1 rep without physical assist    General Comments General comments (skin integrity, edema, etc.): Educated on importance of hip flexor stretching and hip extensor strengthening in prep for prosthesis and gait training  Pertinent Vitals/Pain Pain Assessment Pain Assessment: 0-10 Pain Score: 9  Pain Location: Pt reports sciatic nerve pain, as well as phantom pain LLE Pain Descriptors / Indicators: Burning, Constant, Grimacing, Radiating Pain Intervention(s): Premedicated before session (Waited for IV dilaudid )    Home Living                          Prior Function            PT Goals (current goals can now be found in the care plan section) Acute Rehab PT Goals Patient Stated  Goal: Be able to get to [place]  to stay with family PT Goal Formulation: With patient Time For Goal Achievement: 10/05/24 Potential to Achieve Goals: Good Progress towards PT goals: Progressing toward goals    Frequency    Min 3X/week      PT Plan      Co-evaluation              AM-PAC PT 6 Clicks Mobility   Outcome Measure  Help needed turning from your back to your side while in a flat bed without using bedrails?: A Little Help needed moving from lying on your back to sitting on the side of a flat bed without using bedrails?: A Little Help needed moving to and from a bed to a chair (including a wheelchair)?: A Little Help needed standing up from a chair using your arms (e.g., wheelchair or bedside chair)?: A Little Help needed to walk in hospital room?: A Lot Help needed climbing 3-5 steps with a railing? : Total 6 Click Score: 15    End of Session Equipment Utilized During Treatment: Gait belt (at axillae) Activity Tolerance: Patient tolerated treatment well Patient left: with call bell/phone within reach;in chair;with chair alarm set Nurse Communication: Mobility status PT Visit Diagnosis: Unsteadiness on feet (R26.81);Other abnormalities of gait and mobility (R26.89);Muscle weakness (generalized) (M62.81);Difficulty in walking, not elsewhere classified (R26.2);Other symptoms and signs involving the nervous system (R29.898);Pain Pain - Right/Left: Left Pain - part of body: Leg     Time: 8953-8881 PT Time Calculation (min) (ACUTE ONLY): 32 min  Charges:    $Therapeutic Exercise: 8-22 mins $Therapeutic Activity: 8-22 mins PT General Charges $$ ACUTE PT VISIT: 1 Visit                     Silvano Currier, PT  Acute Rehabilitation Services Office [phone] Secure Chat welcomed    Silvano VEAR Currier 09/28/2024, 2:39 PM

## 2024-09-28 NOTE — Progress Notes (Signed)
 Subjective: No new complaints   Antibiotics:  Anti-infectives (From admission, onward)    Start     Dose/Rate Route Frequency Ordered Stop   09/28/24 1700  fluconazole (DIFLUCAN) tablet 150 mg        150 mg Oral  Once 09/28/24 1601 09/28/24 1654   09/24/24 2200  amoxicillin-clavulanate (AUGMENTIN) 875-125 MG per tablet 1 tablet  Status:  Discontinued        1 tablet Oral Every 12 hours 09/24/24 1852 09/24/24 1857   09/24/24 2000  amoxicillin-clavulanate (AUGMENTIN) 500-125 MG per tablet 1 tablet  Status:  Discontinued        1 tablet Oral Every 24 hours 09/24/24 1857 09/26/24 1018   09/24/24 1200  vancomycin  (VANCOREADY) IVPB 750 mg/150 mL  Status:  Discontinued        750 mg 150 mL/hr over 60 Minutes Intravenous Every M-W-F (Hemodialysis) 09/23/24 0837 09/23/24 1556   09/21/24 1025  vancomycin  variable dose per unstable renal function (pharmacist dosing)  Status:  Discontinued         Does not apply See admin instructions 09/21/24 1025 09/23/24 1556   09/20/24 1200  vancomycin  (VANCOCIN ) IVPB 1000 mg/200 mL premix  Status:  Discontinued        1,000 mg 200 mL/hr over 60 Minutes Intravenous Every M-W-F (Hemodialysis) 09/19/24 1014 09/21/24 1025   09/17/24 2200  ceFEPIme (MAXIPIME) 1 g in sodium chloride  0.9 % 100 mL IVPB  Status:  Discontinued        1 g 200 mL/hr over 30 Minutes Intravenous Every 24 hours 09/16/24 2213 09/23/24 1556   09/17/24 1530  vancomycin  (VANCOCIN ) IVPB 1000 mg/200 mL premix        1,000 mg 200 mL/hr over 60 Minutes Intravenous  Once 09/17/24 1430 09/17/24 1721   09/16/24 1830  vancomycin  (VANCOREADY) IVPB 2000 mg/400 mL        2,000 mg 200 mL/hr over 120 Minutes Intravenous  Once 09/16/24 1815 09/16/24 2158   09/16/24 1830  ceFEPIme (MAXIPIME) 2 g in sodium chloride  0.9 % 100 mL IVPB        2 g 200 mL/hr over 30 Minutes Intravenous  Once 09/16/24 1815 09/16/24 1929       Medications: Scheduled Meds:  (feeding supplement) PROSource Plus   30 mL Oral BID BM   aspirin  EC  81 mg Oral Daily   brimonidine  1 drop Both Eyes TID   Chlorhexidine  Gluconate Cloth  6 each Topical Q0600   [START ON 09/29/2024] Chlorhexidine  Gluconate Cloth  6 each Topical Q0600   cinacalcet   90 mg Oral Q breakfast   darbepoetin (ARANESP ) injection - DIALYSIS  100 mcg Subcutaneous Q Tue-1800   gabapentin   100 mg Oral QHS   gabapentin   200 mg Oral Daily   gentamicin cream   Topical Once   insulin  pump   Subcutaneous TID WC, HS, 0200   Lanthanum  Carbonate  2,000 mg Oral TID WC   metoprolol  tartrate  12.5 mg Oral 2 times per day on Sunday Tuesday Thursday Saturday   pantoprazole  40 mg Oral Daily   polyethylene glycol  17 g Oral BID   senna  1 tablet Oral Daily   Continuous Infusions: PRN Meds:.acetaminophen  **OR** acetaminophen , albuterol , HYDROmorphone  (DILAUDID ) injection, lanthanum , methocarbamol, ondansetron  (ZOFRAN ) IV, mouth rinse, oxyCODONE , oxyCODONE , promethazine, promethazine    Objective: Weight change:   Intake/Output Summary (Last 24 hours) at 09/28/2024 1745 Last data filed at 09/27/2024 2100 Gross per  24 hour  Intake --  Output 0 ml  Net 0 ml   Blood pressure 139/76, pulse 78, temperature 98.8 F (37.1 C), resp. rate 18, height 5' 5 (1.651 m), weight 87 kg, SpO2 100%. Temp:  [98.8 F (37.1 C)-99.1 F (37.3 C)] 98.8 F (37.1 C) (10/28 1653) Pulse Rate:  [74-83] 78 (10/28 1653) Resp:  [18] 18 (10/28 1653) BP: (124-189)/(75-84) 139/76 (10/28 1653) SpO2:  [99 %-100 %] 100 % (10/28 1100)  Physical Exam: Physical Exam Constitutional:      General: She is not in acute distress.    Appearance: She is well-developed. She is not diaphoretic.  HENT:     Head: Normocephalic and atraumatic.     Right Ear: External ear normal.     Left Ear: External ear normal.     Mouth/Throat:     Pharynx: No oropharyngeal exudate.  Eyes:     General: No scleral icterus.    Conjunctiva/sclera: Conjunctivae normal.     Pupils: Pupils are  equal, round, and reactive to light.  Cardiovascular:     Rate and Rhythm: Normal rate and regular rhythm.  Pulmonary:     Effort: Pulmonary effort is normal. No respiratory distress.     Breath sounds: No wheezing.  Abdominal:     General: There is no distension.     Palpations: Abdomen is soft.  Musculoskeletal:        General: No tenderness. Normal range of motion.  Lymphadenopathy:     Cervical: No cervical adenopathy.  Skin:    General: Skin is warm and dry.     Coloration: Skin is not pale.     Findings: No erythema or rash.  Neurological:     General: No focal deficit present.     Mental Status: She is alert and oriented to person, place, and time.     Motor: No abnormal muscle tone.     Coordination: Coordination normal.  Psychiatric:        Mood and Affect: Mood normal.        Behavior: Behavior normal.        Thought Content: Thought content normal.        Judgment: Judgment normal.    BKA site wrapped right foot wrapped  CBC:    BMET Recent Labs    09/27/24 0441 09/28/24 0419  NA 132* 132*  K 5.4* 3.8  CL 89* 93*  CO2 20* 27  GLUCOSE 142* 197*  BUN 57* 41*  CREATININE 8.74* 6.07*  CALCIUM  8.3* 8.7*     Liver Panel  No results for input(s): PROT, ALBUMIN , AST, ALT, ALKPHOS, BILITOT, BILIDIR, IBILI in the last 72 hours.     Sedimentation Rate Recent Labs    09/26/24 1102  ESRSEDRATE >140*   C-Reactive Protein Recent Labs    09/26/24 1102  CRP 22.6*    Micro Results: Recent Results (from the past 720 hours)  Culture, blood (Routine x 2)     Status: None   Collection Time: 09/06/24  4:33 PM   Specimen: BLOOD RIGHT ARM  Result Value Ref Range Status   Specimen Description BLOOD RIGHT ARM  Final   Special Requests   Final    BOTTLES DRAWN AEROBIC AND ANAEROBIC Blood Culture adequate volume   Culture   Final    NO GROWTH 5 DAYS Performed at [facility], [address], [place], [place] [address]    Report  Status 09/11/2024 FINAL  Final  Resp panel  by RT-PCR (RSV, Flu A&B, Covid) Anterior Nasal Swab     Status: None   Collection Time: 09/06/24  6:54 PM   Specimen: Anterior Nasal Swab  Result Value Ref Range Status   SARS Coronavirus 2 by RT PCR NEGATIVE NEGATIVE Final   Influenza A by PCR NEGATIVE NEGATIVE Final   Influenza B by PCR NEGATIVE NEGATIVE Final    Comment: (NOTE) The Xpert Xpress SARS-CoV-2/FLU/RSV plus assay is intended as an aid in the diagnosis of influenza from Nasopharyngeal swab specimens and should not be used as a sole basis for treatment. Nasal washings and aspirates are unacceptable for Xpert Xpress SARS-CoV-2/FLU/RSV testing.  Fact Sheet for Patients: bloggercourse.com  Fact Sheet for Healthcare Providers: seriousbroker.it  This test is not yet approved or cleared by the [place]  FDA and has been authorized for detection and/or diagnosis of SARS-CoV-2 by FDA under an Emergency Use Authorization (EUA). This EUA will remain in effect (meaning this test can be used) for the duration of the COVID-19 declaration under Section 564(b)(1) of the Act, 21 U.S.C. section 360bbb-3(b)(1), unless the authorization is terminated or revoked.     Resp Syncytial Virus by PCR NEGATIVE NEGATIVE Final    Comment: (NOTE) Fact Sheet for Patients: bloggercourse.com  Fact Sheet for Healthcare Providers: seriousbroker.it  This test is not yet approved or cleared by the [place]  FDA and has been authorized for detection and/or diagnosis of SARS-CoV-2 by FDA under an Emergency Use Authorization (EUA). This EUA will remain in effect (meaning this test can be used) for the duration of the COVID-19 declaration under Section 564(b)(1) of the Act, 21 U.S.C. section 360bbb-3(b)(1), unless the authorization is terminated or revoked.  Performed at [facility], [address], [place], [place] [address]   Aerobic/Anaerobic Culture w Gram Stain (surgical/deep wound)     Status: None   Collection Time: 09/10/24  8:00 AM   Specimen: Foot, Left; Tissue  Result Value Ref Range Status   Specimen Description TISSUE  Final   Special Requests LEFT FOREFOOT  Final   Gram Stain NO WBC SEEN FEW GRAM POSITIVE COCCI IN PAIRS   Final   Culture   Final    ABUNDANT MORGANELLA MORGANII ABUNDANT PROTEUS MIRABILIS MODERATE ENTEROCOCCUS FAECALIS NO ANAEROBES ISOLATED Performed at [facility], [address], [place], [place] [address]    Report Status 09/17/2024 FINAL  Final   Organism ID, Bacteria MORGANELLA MORGANII  Final   Organism ID, Bacteria PROTEUS MIRABILIS  Final   Organism ID, Bacteria ENTEROCOCCUS FAECALIS  Final      Susceptibility   Enterococcus faecalis - MIC*    AMPICILLIN <=2 SENSITIVE Sensitive     VANCOMYCIN  1 SENSITIVE Sensitive     GENTAMICIN SYNERGY RESISTANT Resistant     * MODERATE ENTEROCOCCUS FAECALIS   Morganella morganii - MIC*    AMPICILLIN >=32 RESISTANT Resistant     ERTAPENEM <=0.12 SENSITIVE Sensitive     CIPROFLOXACIN  <=0.06 SENSITIVE Sensitive     GENTAMICIN <=1 SENSITIVE Sensitive     MEROPENEM <=0.25 SENSITIVE Sensitive     TRIMETH/SULFA <=20 SENSITIVE Sensitive     AMPICILLIN/SULBACTAM >=32 RESISTANT Resistant     PIP/TAZO Value in next row Sensitive      <=4 SENSITIVEThis is a modified FDA-approved test that has been validated and its performance characteristics determined by the reporting laboratory.  This laboratory is certified under the Clinical Laboratory Improvement Amendments CLIA as qualified to perform high complexity clinical laboratory testing.    *  ABUNDANT MORGANELLA MORGANII   Proteus mirabilis - MIC*    AMPICILLIN Value in next row Sensitive      <=4 SENSITIVEThis is a modified FDA-approved test that has been validated and its performance characteristics determined by the reporting laboratory.  This  laboratory is certified under the Clinical Laboratory Improvement Amendments CLIA as qualified to perform high complexity clinical laboratory testing.    CEFAZOLIN  (NON-URINE) Value in next row Intermediate      <=4 SENSITIVEThis is a modified FDA-approved test that has been validated and its performance characteristics determined by the reporting laboratory.  This laboratory is certified under the Clinical Laboratory Improvement Amendments CLIA as qualified to perform high complexity clinical laboratory testing.    CEFEPIME Value in next row Sensitive      <=4 SENSITIVEThis is a modified FDA-approved test that has been validated and its performance characteristics determined by the reporting laboratory.  This laboratory is certified under the Clinical Laboratory Improvement Amendments CLIA as qualified to perform high complexity clinical laboratory testing.    ERTAPENEM Value in next row Sensitive      <=4 SENSITIVEThis is a modified FDA-approved test that has been validated and its performance characteristics determined by the reporting laboratory.  This laboratory is certified under the Clinical Laboratory Improvement Amendments CLIA as qualified to perform high complexity clinical laboratory testing.    CEFTRIAXONE  Value in next row Sensitive      <=4 SENSITIVEThis is a modified FDA-approved test that has been validated and its performance characteristics determined by the reporting laboratory.  This laboratory is certified under the Clinical Laboratory Improvement Amendments CLIA as qualified to perform high complexity clinical laboratory testing.    CIPROFLOXACIN  Value in next row Sensitive      <=4 SENSITIVEThis is a modified FDA-approved test that has been validated and its performance characteristics determined by the reporting laboratory.  This laboratory is certified under the Clinical Laboratory Improvement Amendments CLIA as qualified to perform high complexity clinical laboratory testing.     GENTAMICIN Value in next row Sensitive      <=4 SENSITIVEThis is a modified FDA-approved test that has been validated and its performance characteristics determined by the reporting laboratory.  This laboratory is certified under the Clinical Laboratory Improvement Amendments CLIA as qualified to perform high complexity clinical laboratory testing.    MEROPENEM Value in next row Sensitive      <=4 SENSITIVEThis is a modified FDA-approved test that has been validated and its performance characteristics determined by the reporting laboratory.  This laboratory is certified under the Clinical Laboratory Improvement Amendments CLIA as qualified to perform high complexity clinical laboratory testing.    TRIMETH/SULFA Value in next row Resistant      <=4 SENSITIVEThis is a modified FDA-approved test that has been validated and its performance characteristics determined by the reporting laboratory.  This laboratory is certified under the Clinical Laboratory Improvement Amendments CLIA as qualified to perform high complexity clinical laboratory testing.    AMPICILLIN/SULBACTAM Value in next row Sensitive      <=4 SENSITIVEThis is a modified FDA-approved test that has been validated and its performance characteristics determined by the reporting laboratory.  This laboratory is certified under the Clinical Laboratory Improvement Amendments CLIA as qualified to perform high complexity clinical laboratory testing.    PIP/TAZO Value in next row Sensitive      <=4 SENSITIVEThis is a modified FDA-approved test that has been validated and its performance characteristics determined by the reporting laboratory.  This laboratory is  certified under the Clinical Laboratory Improvement Amendments CLIA as qualified to perform high complexity clinical laboratory testing.    * ABUNDANT PROTEUS MIRABILIS  Culture, blood (Routine x 2)     Status: None   Collection Time: 09/16/24  5:10 PM   Specimen: BLOOD RIGHT ARM  Result Value  Ref Range Status   Specimen Description BLOOD RIGHT ARM  Final   Special Requests   Final    BOTTLES DRAWN AEROBIC AND ANAEROBIC Blood Culture results may not be optimal due to an inadequate volume of blood received in culture bottles   Culture   Final    NO GROWTH 5 DAYS Performed at [facility], [address] [place], [place] [address]    Report Status 09/21/2024 FINAL  Final  Culture, blood (Routine x 2)     Status: None   Collection Time: 09/16/24  5:42 PM   Specimen: BLOOD RIGHT ARM  Result Value Ref Range Status   Specimen Description BLOOD RIGHT ARM  Final   Special Requests   Final    BOTTLES DRAWN AEROBIC AND ANAEROBIC Blood Culture results may not be optimal due to an inadequate volume of blood received in culture bottles   Culture   Final    NO GROWTH 5 DAYS Performed at [facility], [address], [place], [place] [address]    Report Status 09/21/2024 FINAL  Final  Body fluid culture w Gram Stain     Status: None   Collection Time: 09/16/24  9:07 PM   Specimen: Peritoneal Washings; Body Fluid  Result Value Ref Range Status   Specimen Description PERITONEAL FLUID  Final   Special Requests Immunocompromised  Final   Gram Stain NO ORGANISMS SEEN NO WBC SEEN CYTOSPIN SMEAR   Final   Culture   Final    NO GROWTH 3 DAYS Performed at [facility], [address], [place], [place] [address]    Report Status 09/20/2024 FINAL  Final  MRSA Next Gen by PCR, Nasal     Status: None   Collection Time: 09/17/24  4:48 AM   Specimen: Nasal Mucosa; Nasal Swab  Result Value Ref Range Status   MRSA by PCR Next Gen NOT DETECTED NOT DETECTED Final    Comment: (NOTE) The GeneXpert MRSA Assay (FDA approved for NASAL specimens only), is one component of a comprehensive MRSA colonization surveillance program. It is not intended to diagnose MRSA infection nor to guide or monitor treatment for MRSA infections. Test performance is not FDA approved in patients less  than 37 years old. Performed at [facility], [address], [place], [place] [address]   Culture, blood (Routine X 2) w Reflex to ID Panel     Status: None (Preliminary result)   Collection Time: 09/25/24  5:19 AM   Specimen: BLOOD RIGHT HAND  Result Value Ref Range Status   Specimen Description BLOOD RIGHT HAND  Final   Special Requests   Final    BOTTLES DRAWN AEROBIC AND ANAEROBIC Blood Culture adequate volume   Culture   Final    NO GROWTH 3 DAYS Performed at [facility], [address], [place], [place] [address]    Report Status PENDING  Incomplete  Culture, blood (Routine X 2) w Reflex to ID Panel     Status: None (Preliminary result)   Collection Time: 09/25/24  5:22 AM   Specimen: BLOOD RIGHT ARM  Result Value Ref Range Status  Specimen Description BLOOD RIGHT ARM  Final   Special Requests   Final    BOTTLES DRAWN AEROBIC AND ANAEROBIC Blood Culture adequate volume   Culture   Final    NO GROWTH 3 DAYS Performed at [facility], [address], [place], [place] [address]    Report Status PENDING  Incomplete    Studies/Results: No results found.    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 Malvin October 10  Cultures yielded Morganella Pagni as well as Proteus and Enterococcus faecalis in the hospitalization with Zosyn  and vancomycin  then Zosyn  alone and then discharged on oral ciprofloxacin   Unfortunate develops critical ischemic findings on the other side or went left below the knee amputation.  During readmission she was treated cefepime and vancomycin  to the 16th and was going to be discharged and had low-grade fevers and was placed on oral Augmentin MRI of the foot was repeated which showed findings concerning for osteomyelitis in the head of the distal third proximal phalanx where she had had the amputation.  The MRI feeding reading is course difficult in the sense that she recently had an amputation and this findings could be consistent with surgery or osteomyelitis that is still remaining.  Her inflammatory markers in particular her sed rate which is greater than 140 now versus 114 in early October is disconcerting, ERP is now up from normal at 0.9-22.6  I personally worry she has residual infection in this foot.  One question is does she need revascularization on right side.  Reasonable to refer her off antibiotics as per Dr. Emmitt plan but again my anxiety is she still has residual osteomyelitis here  SO far she has done well the first 2 days off of antibiotics  I will recheck her ESR and CRP tomorrow am  I personally spent a total of 51 minutes in the care of the patient today including preparing to see the patient, getting/reviewing separately obtained history,  performing a medically appropriate exam/evaluation, counseling and educating, placing orders, and documenting clinical information in the EHR.   Evaluation of the patient requires complex antimicrobial therapy evaluation, counseling , isolation needs to reduce disease transmission and risk assessment and mitigation.      LOS: 12 days   Jomarie Fleeta Rothman 09/28/2024, 5:45 PM

## 2024-09-28 NOTE — Progress Notes (Signed)
 Physical Therapy Treatment Patient Details Name: Karen [name] MRN: [id number] DOB: June 14, 1976 Today's Date: 09/28/2024   History of Present Illness Karen [name] is a 48 y.o. female admitted on 09/16/24 with infected left diabetic foot wound. 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    PT Comments  Continuing work on functional mobility and activity tolerance;  Practiced sit-to-stand and stand-pivot to the bed on the right with min assist; pt had been seated for a long period of time and reported weakness/stiffness which showed up as less stability in single leg stance during her step-pivot transfer. Plan to continue transfer training to make the drive back to [place]  easier and more safe; will continue working on hip extension and glute strength to progress toward her long-term goal of prothesis training and walking.     If plan is discharge home, recommend the following:     Can travel by private vehicle     Yes  Equipment Recommendations  Wheelchair (measurements PT);Wheelchair cushion (measurements PT);Other (comment) (REports her current wheelchair is in disrepair)    Recommendations for Other Services       Precautions / Restrictions Precautions Precautions: Fall Recall of Precautions/Restrictions: Intact Restrictions RLE Weight Bearing Per Provider Order: Weight bearing as tolerated LLE Weight Bearing Per Provider Order: Non weight bearing Other Position/Activity Restrictions: WBAT R foot with post op shoe, NWB LLE with limb protector     Mobility  Bed Mobility Overal bed mobility: Modified Independent             General bed mobility comments: Increased time but no  assist required.    Transfers Overall transfer level: Needs assistance Equipment used: Rolling walker (2 wheels) Transfers: Sit to/from Stand, Bed to chair/wheelchair/BSC Sit to Stand: Mod assist, +2 safety/equipment   Step pivot transfers: +2 safety/equipment, Min assist      Lateral/Scoot Transfers: Min assist General transfer comment: Mod assist to rise from recliner to RW; took small Hop-steps recliner to bed on her R; light mod assist to stabilize    Ambulation/Gait               General Gait Details: Deferred due to pain/pt request   Stairs             Wheelchair Mobility     Tilt Bed    Modified Rankin (Stroke Patients Only)       Balance     Sitting balance-Leahy Scale: Good       Standing balance-Leahy Scale: Poor Standing balance comment: Up to min assist to steady                            Communication Communication Communication: No apparent difficulties  Cognition Arousal: Alert Behavior During Therapy: WFL for tasks assessed/performed   PT - Cognitive impairments: No apparent impairments                         Following commands: Intact      Cueing Cueing Techniques: Verbal cues, Gestural cues  Exercises Amputee Exercises Chair Push Up: AROM (2 reps) Other Exercises Other Exercises: Bolstered bridging x5; min assist to steady bolsters under LLE; Able ot perform 1  rep without physical assist    General Comments General comments (skin integrity, edema, etc.): educated on importance of hamstring stretching for pre-prosthesis trining      Pertinent Vitals/Pain Pain Assessment Pain Assessment: Faces Pain Score: 9  Faces Pain Scale: Hurts a little bit Pain Location: Pt reports sciatic nerve pain, as well as phantom pain LLE Pain Descriptors / Indicators: Burning, Constant, Grimacing, Radiating Pain Intervention(s): Monitored during session, Limited activity within patient's tolerance    Home  Living                          Prior Function            PT Goals (current goals can now be found in the care plan section) Acute Rehab PT Goals Patient Stated Goal: Be able to get to [place]  to stay with family PT Goal Formulation: With patient Time For Goal Achievement: 10/05/24 Potential to Achieve Goals: Good Progress towards PT goals: Progressing toward goals    Frequency    Min 3X/week      PT Plan      Co-evaluation              AM-PAC PT 6 Clicks Mobility   Outcome Measure  Help needed turning from your back to your side while in a flat bed without using bedrails?: A Little Help needed moving from lying on your back to sitting on the side of a flat bed without using bedrails?: A Little Help needed moving to and from a bed to a chair (including a wheelchair)?: A Little Help needed standing up from a chair using your arms (e.g., wheelchair or bedside chair)?: A Little Help needed to walk in hospital room?: A Lot Help needed climbing 3-5 steps with a railing? : Total 6 Click Score: 15    End of Session Equipment Utilized During Treatment: Gait belt Activity Tolerance: Patient tolerated treatment well Patient left: with call bell/phone within reach;in chair;with chair alarm set Nurse Communication: Mobility status PT Visit Diagnosis: Unsteadiness on feet (R26.81);Other abnormalities of gait and mobility (R26.89);Muscle weakness (generalized) (M62.81);Difficulty in walking, not elsewhere classified (R26.2);Other symptoms and signs involving the nervous system (R29.898);Pain Pain - Right/Left: Left Pain - part of body: Leg     Time: 8369-8351 PT Time Calculation (min) (ACUTE ONLY): 18 min  Charges:    $Therapeutic Activity: 8-22 mins PT General Charges $$ ACUTE PT VISIT: 1 Visit                     Silvano Currier, PT  Acute Rehabilitation Services Office [phone] Secure Chat welcomed    Silvano VEAR Currier 09/28/2024, 8:05 PM

## 2024-09-28 NOTE — Progress Notes (Addendum)
  [facility] Progress Note   Subjective:    Seen in room, no c/o's today  Objective Vitals:   09/27/24 1731 09/27/24 2045 09/28/24 0514 09/28/24 1100  BP: (!) 164/85 133/75 124/78 (!) 189/84  Pulse: 84 79 74 83  Resp: 17 18 18    Temp: 98.4 F (36.9 C) 99.1 F (37.3 C) 98.9 F (37.2 C)   TempSrc: Oral Oral Oral   SpO2: 96% 99% 100% 100%  Weight:      Height:       Physical Exam General: Alert female in NAD, on RA Heart: RRR, no murmurs, rubs or gallops Lungs: CTA bilaterally, respirations unlabored Abdomen: Soft, non-distended, +BS Extremities: L BKA, R foot wrapped, no edema appreciated Dialysis Access: AVF + t/b, PD cath in abdomen   Dialysis Orders: MWF [facility]-->  4h  B400   90kg 2K bath  AVF  Heparin  none  Assessment/Plan: 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 Issues in R foot as well.  ESRD: on HD MWF. HD tomorrow. PD cath: she has a PD cath but has not started PD yet, PD cath flushed 10/24 and 10/27.  Anemia: Hgb has dropped to 7- 8 range. Last tsat at 13% on 10/10. No recent IV Fe, but is not a candidate for now w/ active infection. Will start ESA w/ darbe 100 mcg weekly.  CKD-MBD: Calcium  level variable, not on VDRA. Phos variable. Continue lanthanum . On sensipar  for PTH Hypertension: Blood pressure currently controlled   Rob Geralynn  MD  [facility] 09/28/2024, 1:13 PM  Recent Labs  Lab 09/22/24 0521 09/24/24 0955 09/25/24 0519 09/27/24 0441 09/27/24 1332 09/28/24 0419  HGB 9.4* 7.4*   < >  --  7.6* 7.6*  ALBUMIN  2.5* 2.2*  --   --   --   --   CALCIUM  8.9 8.2*   < > 8.3*  --  8.7*  PHOS 7.3* 6.1*  --  7.1*  --   --   CREATININE 7.83* 7.17*   < > 8.74*  --  6.07*  K 5.6* 4.8   < > 5.4*  --  3.8   < > = values in this interval not displayed.    Inpatient medications:  (feeding supplement) PROSource Plus  30 mL Oral BID BM    aspirin  EC  81 mg Oral Daily   brimonidine  1 drop Both Eyes TID   Chlorhexidine  Gluconate Cloth  6 each Topical Q0600   cinacalcet   90 mg Oral Q breakfast   gabapentin   100 mg Oral QHS   gabapentin   200 mg Oral Daily   gentamicin cream   Topical Once   insulin  pump   Subcutaneous TID WC, HS, 0200   Lanthanum  Carbonate  2,000 mg Oral TID WC   metoprolol  tartrate  12.5 mg Oral 2 times per day on Sunday Tuesday Thursday Saturday   pantoprazole  40 mg Oral Daily   senna  1 tablet Oral Daily    acetaminophen  **OR** acetaminophen , albuterol , HYDROmorphone  (DILAUDID ) injection, lanthanum , methocarbamol, ondansetron  (ZOFRAN ) IV, mouth rinse, oxyCODONE , oxyCODONE , polyethylene glycol, promethazine, promethazine

## 2024-09-29 ENCOUNTER — Other Ambulatory Visit (HOSPITAL_COMMUNITY): Payer: Self-pay

## 2024-09-29 ENCOUNTER — Inpatient Hospital Stay (HOSPITAL_COMMUNITY)

## 2024-09-29 DIAGNOSIS — Z992 Dependence on renal dialysis: Secondary | ICD-10-CM | POA: Diagnosis not present

## 2024-09-29 DIAGNOSIS — B952 Enterococcus as the cause of diseases classified elsewhere: Secondary | ICD-10-CM

## 2024-09-29 DIAGNOSIS — N186 End stage renal disease: Secondary | ICD-10-CM | POA: Diagnosis not present

## 2024-09-29 DIAGNOSIS — M86372 Chronic multifocal osteomyelitis, left ankle and foot: Secondary | ICD-10-CM | POA: Diagnosis not present

## 2024-09-29 DIAGNOSIS — Z794 Long term (current) use of insulin: Secondary | ICD-10-CM | POA: Diagnosis not present

## 2024-09-29 DIAGNOSIS — E1142 Type 2 diabetes mellitus with diabetic polyneuropathy: Secondary | ICD-10-CM | POA: Diagnosis not present

## 2024-09-29 LAB — BASIC METABOLIC PANEL WITH GFR
Anion gap: 17 — ABNORMAL HIGH (ref 5–15)
BUN: 53 mg/dL — ABNORMAL HIGH (ref 6–20)
CO2: 25 mmol/L (ref 22–32)
Calcium: 8.6 mg/dL — ABNORMAL LOW (ref 8.9–10.3)
Chloride: 91 mmol/L — ABNORMAL LOW (ref 98–111)
Creatinine, Ser: 7.92 mg/dL — ABNORMAL HIGH (ref 0.44–1.00)
GFR, Estimated: 6 mL/min — ABNORMAL LOW (ref >60)
Glucose, Bld: 142 mg/dL — ABNORMAL HIGH (ref 70–99)
Potassium: 4.4 mmol/L (ref 3.5–5.1)
Sodium: 133 mmol/L — ABNORMAL LOW (ref 135–145)

## 2024-09-29 LAB — CBC WITH DIFFERENTIAL/PLATELET
Abs Immature Granulocytes: 0.07 K/uL (ref 0.00–0.07)
Basophils Absolute: 0.1 K/uL (ref 0.0–0.1)
Basophils Relative: 1 %
Eosinophils Absolute: 0.4 K/uL (ref 0.0–0.5)
Eosinophils Relative: 3 %
HCT: 25.6 % — ABNORMAL LOW (ref 36.0–46.0)
Hemoglobin: 8 g/dL — ABNORMAL LOW (ref 12.0–15.0)
Immature Granulocytes: 1 %
Lymphocytes Relative: 21 %
Lymphs Abs: 2.7 K/uL (ref 0.7–4.0)
MCH: 29.2 pg (ref 26.0–34.0)
MCHC: 31.3 g/dL (ref 30.0–36.0)
MCV: 93.4 fL (ref 80.0–100.0)
Monocytes Absolute: 1 K/uL (ref 0.1–1.0)
Monocytes Relative: 8 %
Neutro Abs: 8.9 K/uL — ABNORMAL HIGH (ref 1.7–7.7)
Neutrophils Relative %: 66 %
Platelets: 442 K/uL — ABNORMAL HIGH (ref 150–400)
RBC: 2.74 MIL/uL — ABNORMAL LOW (ref 3.87–5.11)
RDW: 13.6 % (ref 11.5–15.5)
WBC: 13.2 K/uL — ABNORMAL HIGH (ref 4.0–10.5)
nRBC: 0 % (ref 0.0–0.2)

## 2024-09-29 LAB — QUANTIFERON-TB GOLD PLUS (RQFGPL)
QuantiFERON Mitogen Value: 0.56 [IU]/mL
QuantiFERON Nil Value: 0.02 [IU]/mL
QuantiFERON TB1 Ag Value: 0.02 [IU]/mL
QuantiFERON TB2 Ag Value: 0.02 [IU]/mL

## 2024-09-29 LAB — GLUCOSE, CAPILLARY
Glucose-Capillary: 122 mg/dL — ABNORMAL HIGH (ref 70–99)
Glucose-Capillary: 159 mg/dL — ABNORMAL HIGH (ref 70–99)
Glucose-Capillary: 106 mg/dL — ABNORMAL HIGH (ref 70–99)
Glucose-Capillary: 210 mg/dL — ABNORMAL HIGH (ref 70–99)

## 2024-09-29 LAB — SEDIMENTATION RATE: Sed Rate: >140 mm/h — ABNORMAL HIGH (ref 0–22)

## 2024-09-29 LAB — QUANTIFERON-TB GOLD PLUS: QuantiFERON-TB Gold Plus: NEGATIVE

## 2024-09-29 LAB — C-REACTIVE PROTEIN: CRP: 21.4 mg/dL — ABNORMAL HIGH (ref <1.0)

## 2024-09-29 MED ORDER — LIDOCAINE-PRILOCAINE 2.5-2.5 % EX CREA: 1.0000 | TOPICAL_CREAM | CUTANEOUS | Status: DC | PRN

## 2024-09-29 MED ORDER — ANTICOAGULANT SODIUM CITRATE 4% (200MG/5ML) IV SOLN: 5.0000 mL | Status: DC | PRN

## 2024-09-29 MED ORDER — HEPARIN SODIUM (PORCINE) 1000 UNIT/ML IJ SOLN
INTRAMUSCULAR | Status: AC
  Filled 2024-09-29: qty 4

## 2024-09-29 MED ORDER — PENTAFLUOROPROP-TETRAFLUOROETH EX AERO: 1.0000 | INHALATION_SPRAY | CUTANEOUS | Status: DC | PRN

## 2024-09-29 MED ORDER — ALTEPLASE 2 MG IJ SOLR: 2.0000 mg | Freq: Once | INTRAMUSCULAR | Status: DC | PRN

## 2024-09-29 MED ORDER — METHOCARBAMOL 500 MG PO TABS: 500.0000 mg | ORAL_TABLET | Freq: Four times a day (QID) | ORAL | Status: DC | PRN

## 2024-09-29 MED ORDER — GLUCAGON (RDNA) 1 MG IJ KIT: 1.0000 mg | PACK | Freq: Once | INTRAMUSCULAR | 0 refills | Status: AC | PRN

## 2024-09-29 MED ORDER — POLYETHYLENE GLYCOL 3350 17 GM/SCOOP PO POWD
17.0000 g | Freq: Two times a day (BID) | ORAL | 0 refills | Status: AC
  Filled 2024-09-29: qty 238, 7d supply, fill #0

## 2024-09-29 MED ORDER — LIDOCAINE HCL (PF) 1 % IJ SOLN: 5.0000 mL | INTRAMUSCULAR | Status: DC | PRN

## 2024-09-29 MED ORDER — SENNOSIDES-DOCUSATE SODIUM 8.6-50 MG PO TABS
1.0000 | ORAL_TABLET | Freq: Two times a day (BID) | ORAL | 0 refills | Status: AC
  Filled 2024-09-29: qty 30, 15d supply, fill #0

## 2024-09-29 MED ORDER — HEPARIN SODIUM (PORCINE) 1000 UNIT/ML DIALYSIS: 1000.0000 [IU] | INTRAMUSCULAR | Status: DC | PRN

## 2024-09-29 MED ORDER — SMOG ENEMA
400.0000 mL | Freq: Once | RECTAL | Status: DC
  Filled 2024-09-29: qty 960

## 2024-09-29 MED ORDER — POLYETHYLENE GLYCOL 3350 17 G PO PACK: 17.0000 g | PACK | Freq: Two times a day (BID) | ORAL | Status: DC

## 2024-09-29 MED ORDER — ONDANSETRON HCL 4 MG/2ML IJ SOLN: 4.0000 mg | Freq: Four times a day (QID) | INTRAMUSCULAR | Status: DC | PRN

## 2024-09-29 MED ORDER — HYDROMORPHONE HCL 1 MG/ML IJ SOLN
INTRAMUSCULAR | Status: AC
  Filled 2024-09-29: qty 0.5

## 2024-09-29 MED ORDER — NEPRO/CARBSTEADY PO LIQD
237.0000 mL | ORAL | 0 refills | Status: AC | PRN
  Filled 2024-09-29: qty 10000, fill #0

## 2024-09-29 MED ORDER — METHOCARBAMOL 500 MG PO TABS: 500.0000 mg | ORAL_TABLET | Freq: Four times a day (QID) | ORAL | 0 refills | Status: AC | PRN

## 2024-09-29 MED ORDER — SENNOSIDES-DOCUSATE SODIUM 8.6-50 MG PO TABS: 2.0000 | ORAL_TABLET | Freq: Two times a day (BID) | ORAL | Status: DC

## 2024-09-29 MED ORDER — OXYCODONE-ACETAMINOPHEN 10-325 MG PO TABS
1.0000 | ORAL_TABLET | ORAL | 0 refills | Status: AC | PRN
  Filled 2024-09-29: qty 30, 5d supply, fill #0

## 2024-09-29 MED ORDER — PROSOURCE PLUS PO LIQD
30.0000 mL | Freq: Two times a day (BID) | ORAL | 0 refills | Status: AC
  Filled 2024-09-29: qty 1000, 17d supply, fill #0

## 2024-09-29 MED ORDER — NEPRO/CARBSTEADY PO LIQD: 237.0000 mL | ORAL | Status: DC | PRN

## 2024-09-29 MED ORDER — PROMETHAZINE HCL 25 MG PO TABS: 25.0000 mg | ORAL_TABLET | Freq: Four times a day (QID) | ORAL | Status: DC | PRN

## 2024-09-29 MED ORDER — METHOCARBAMOL 500 MG PO TABS
500.0000 mg | ORAL_TABLET | Freq: Four times a day (QID) | ORAL | 0 refills | Status: DC | PRN
  Filled 2024-09-29: qty 120, 30d supply, fill #0

## 2024-09-29 NOTE — Progress Notes (Signed)
 Occupational Therapy Treatment Patient Details Name: Karen [name] MRN: [id number] DOB: Jul 20, 1976 Today's Date: 09/29/2024   History of present illness Karen [name] is a 48 y.o. female admitted on 09/16/24 with infected left diabetic foot wound. 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   OT comments  Pt agreeable to session and wanted to complete sponge bath. Pt was set up at the EOB and completed bath with supervision to CGA as cued on how to complete with following precautions with increase in time. She then agreed to go to chair with lateral scoot with CGA. She does require min assist with donning RLE post op shoe. If pt has support at home recommendation for [facility] services.       If plan is discharge home, recommend the following:  A lot of help with bathing/dressing/bathroom;A lot of help with walking and/or transfers;Assist for transportation;Assistance with cooking/housework   Equipment Recommendations  None recommended by OT    Recommendations for Other Services      Precautions / Restrictions Precautions Precautions: Fall Recall of Precautions/Restrictions: Intact Required Braces or Orthoses:  (limb guard for LLE and post op shoe for RLE) Restrictions Weight Bearing Restrictions Per Provider Order: Yes RLE Weight Bearing Per Provider Order: Weight bearing as tolerated LLE Weight Bearing Per Provider Order: Non weight bearing Other Position/Activity Restrictions: WBAT R foot with post op shoe, NWB LLE with limb protector       Mobility Bed Mobility Overal bed mobility: Needs Assistance Bed Mobility: Supine to Sit     Supine to sit: Modified independent (Device/Increase  time), HOB elevated, Used rails          Transfers Overall transfer level: Needs assistance Equipment used: None               General transfer comment: pt completed lateral transfer to chair with close CGA and set up of chair     Balance Overall balance assessment: Needs assistance Sitting-balance support: Feet supported Sitting balance-Leahy Scale: Good Sitting balance - Comments: completed bath at EOB                                   ADL either performed or assessed with clinical judgement   ADL Overall ADL's : Needs assistance/impaired Eating/Feeding: Independent   Grooming: Wash/dry hands;Wash/dry face;Oral care;Applying deodorant;Brushing hair;Set up;Sitting   Upper Body Bathing: Set up;Sitting   Lower Body Bathing: Minimal assistance;Moderate assistance;Sitting/lateral leans Lower Body Bathing Details (indicate cue type and reason): at EOB Upper Body Dressing : Set up;Sitting   Lower Body Dressing: Minimal assistance;Sitting/lateral leans                      Extremity/Trunk Assessment Upper Extremity Assessment Upper Extremity Assessment: Generalized weakness RUE Sensation: decreased light touch LUE Sensation: decreased light touch            Vision   Vision Assessment?: Wears glasses for reading   Perception Perception Perception: Not tested   Praxis Praxis Praxis: Not tested   Communication Communication Communication: No apparent difficulties   Cognition Arousal: Alert Behavior During Therapy: WFL for tasks assessed/performed Cognition: No apparent impairments  Following commands: Intact        Cueing   Cueing Techniques: Verbal cues, Gestural cues  Exercises      Shoulder Instructions       General Comments      Pertinent Vitals/ Pain       Pain Assessment Pain Assessment: Faces Faces Pain Scale: Hurts little more Breathing: normal Negative  Vocalization: none Pain Location: phantom pain Pain Descriptors / Indicators: Discomfort, Grimacing, Guarding Pain Intervention(s): Monitored during session  Home Living                                          Prior Functioning/Environment              Frequency  Min 2X/week        Progress Toward Goals  OT Goals(current goals can now be found in the care plan section)  Progress towards OT goals: Progressing toward goals  Acute Rehab OT Goals Patient Stated Goal: to get better OT Goal Formulation: With patient Time For Goal Achievement: 10/05/24 Potential to Achieve Goals: Good ADL Goals Pt Will Perform Grooming: with modified independence;sitting Pt Will Perform Lower Body Bathing: with modified independence;sitting/lateral leans Pt Will Perform Upper Body Dressing: with set-up;sitting Pt Will Perform Lower Body Dressing: with modified independence;sitting/lateral leans Pt Will Transfer to Toilet: with modified independence;stand pivot transfer;regular height toilet Pt Will Perform Toileting - Clothing Manipulation and hygiene: with max assist  Plan      Co-evaluation                 AM-PAC OT 6 Clicks Daily Activity     Outcome Measure   Help from another person eating meals?: None Help from another person taking care of personal grooming?: A Little Help from another person toileting, which includes using toliet, bedpan, or urinal?: A Lot Help from another person bathing (including washing, rinsing, drying)?: A Little Help from another person to put on and taking off regular upper body clothing?: A Little Help from another person to put on and taking off regular lower body clothing?: A Little 6 Click Score: 18    End of Session    OT Visit Diagnosis: Unsteadiness on feet (R26.81);Pain Pain - Right/Left: Left Pain - part of body: Leg   Activity Tolerance Patient tolerated treatment well   Patient Left in chair;with call  bell/phone within reach;with chair alarm set (NT cleaning bed)   Nurse Communication Mobility status        Time: 9194-9144 OT Time Calculation (min): 50 min  Charges: OT General Charges $OT Visit: 1 Visit OT Treatments $Self Care/Home Management : 38-52 mins  Warrick POUR OTR/L  Acute Rehab Services  [phone] office number   Warrick Berber 09/29/2024, 9:45 AM

## 2024-09-29 NOTE — TOC Transition Note (Signed)
 Transition of Care [facility]) - Discharge Note   Patient Details  Name: Karen [name] MRN: [id number] Date of Birth: 02/12/1976  Transition of Care [facility]) CM/SW Contact:  Tom-Johnson, Harvest Muskrat, RN Phone Number: 09/29/2024, 3:54 PM   Clinical Narrative:     Patient is scheduled for discharge today.  Readmission Risk Assessment done. Home health info, Outpatient f/u, hospital f/u and discharge instructions on AVS. Wheelchair ordered from [employer] and [employer] to deliver to patient at bedside.  CM notified Burnard Gaba of patient's request for a RN to come to her home tomorrow. Prescriptions sent to [facility] pharmacy and patient will receive meds prior discharge. Daughter, Marland to transport at discharge.  No further ICM needs noted.     Final next level of care: Home w Home Health Services Barriers to Discharge: Barriers Resolved   Patient Goals and CMS Choice Patient states their goals for this hospitalization and ongoing recovery are:: To return home CMS Medicare.gov Compare Post Acute Care list provided to:: Patient Choice offered to / list presented to : Patient, Adult Children (Daughter, LeAna)      Discharge Placement                Patient to be transferred to facility by: Daughter Name of family member notified: [facility]    Discharge Plan and Services Additional resources added to the After Visit Summary for                  DME Arranged: Wheelchair manual DME Agency: [employer] Date DME Agency Contacted: 09/29/24 Time DME Agency Contacted: [phone] Representative spoke with at DME Agency: Arthea            Social Drivers of Health (SDOH) Interventions SDOH Screenings   Food Insecurity: No Food Insecurity (09/17/2024)  Recent Concern: Food Insecurity - Food Insecurity Present (08/25/2024)   Received from [facility]  Housing: Low Risk  (09/17/2024)  Transportation Needs: No Transportation Needs (09/17/2024)  Utilities: Not At Risk  (09/17/2024)  Financial Resource Strain: Medium Risk (07/28/2024)   Received from [facility]  Physical Activity: Inactive (07/28/2024)   Received from [facility]  Social Connections: Socially Integrated (07/28/2024)   Received from [facility]  Stress: No Stress Concern Present (08/25/2024)   Received from [facility]  Tobacco Use: Medium Risk (09/20/2024)     Readmission Risk Interventions    09/17/2024    3:11 PM  Readmission Risk Prevention Plan  Transportation Screening Complete  PCP or Specialist Appt within 3-5 Days Complete  HRI or Home Care Consult Complete  Social Work Consult for Recovery Care Planning/Counseling Complete  Palliative Care Screening Not Applicable  Medication Review Oceanographer) Referral to Pharmacy

## 2024-09-29 NOTE — Progress Notes (Signed)
 Subjective: No new complaints   Antibiotics:  Anti-infectives (From admission, onward)    Start     Dose/Rate Route Frequency Ordered Stop   09/28/24 1700  fluconazole (DIFLUCAN) tablet 150 mg        150 mg Oral  Once 09/28/24 1601 09/28/24 1654   09/24/24 2200  amoxicillin-clavulanate (AUGMENTIN) 875-125 MG per tablet 1 tablet  Status:  Discontinued        1 tablet Oral Every 12 hours 09/24/24 1852 09/24/24 1857   09/24/24 2000  amoxicillin-clavulanate (AUGMENTIN) 500-125 MG per tablet 1 tablet  Status:  Discontinued        1 tablet Oral Every 24 hours 09/24/24 1857 09/26/24 1018   09/24/24 1200  vancomycin  (VANCOREADY) IVPB 750 mg/150 mL  Status:  Discontinued        750 mg 150 mL/hr over 60 Minutes Intravenous Every M-W-F (Hemodialysis) 09/23/24 0837 09/23/24 1556   09/21/24 1025  vancomycin  variable dose per unstable renal function (pharmacist dosing)  Status:  Discontinued         Does not apply See admin instructions 09/21/24 1025 09/23/24 1556   09/20/24 1200  vancomycin  (VANCOCIN ) IVPB 1000 mg/200 mL premix  Status:  Discontinued        1,000 mg 200 mL/hr over 60 Minutes Intravenous Every M-W-F (Hemodialysis) 09/19/24 1014 09/21/24 1025   09/17/24 2200  ceFEPIme (MAXIPIME) 1 g in sodium chloride  0.9 % 100 mL IVPB  Status:  Discontinued        1 g 200 mL/hr over 30 Minutes Intravenous Every 24 hours 09/16/24 2213 09/23/24 1556   09/17/24 1530  vancomycin  (VANCOCIN ) IVPB 1000 mg/200 mL premix        1,000 mg 200 mL/hr over 60 Minutes Intravenous  Once 09/17/24 1430 09/17/24 1721   09/16/24 1830  vancomycin  (VANCOREADY) IVPB 2000 mg/400 mL        2,000 mg 200 mL/hr over 120 Minutes Intravenous  Once 09/16/24 1815 09/16/24 2158   09/16/24 1830  ceFEPIme (MAXIPIME) 2 g in sodium chloride  0.9 % 100 mL IVPB        2 g 200 mL/hr over 30 Minutes Intravenous  Once 09/16/24 1815 09/16/24 1929       Medications: Scheduled Meds:  (feeding supplement) PROSource Plus   30 mL Oral BID BM   aspirin  EC  81 mg Oral Daily   brimonidine  1 drop Both Eyes TID   Chlorhexidine  Gluconate Cloth  6 each Topical Q0600   Chlorhexidine  Gluconate Cloth  6 each Topical Q0600   cinacalcet   90 mg Oral Q breakfast   darbepoetin (ARANESP ) injection - DIALYSIS  100 mcg Subcutaneous Q Tue-1800   gabapentin   100 mg Oral QHS   gabapentin   200 mg Oral Daily   gentamicin cream   Topical Once   insulin  pump   Subcutaneous TID WC, HS, 0200   Lanthanum  Carbonate  2,000 mg Oral TID WC   metoprolol  tartrate  12.5 mg Oral 2 times per day on Sunday Tuesday Thursday Saturday   pantoprazole  40 mg Oral Daily   polyethylene glycol  17 g Oral BID   senna-docusate  2 tablet Oral BID   SMOG  400 mL Rectal Once   Continuous Infusions:  anticoagulant sodium citrate      PRN Meds:.acetaminophen  **OR** acetaminophen , albuterol , alteplase , anticoagulant sodium citrate , feeding supplement (NEPRO CARB STEADY), heparin , HYDROmorphone  (DILAUDID ) injection, lanthanum , lidocaine  (PF), lidocaine -prilocaine , methocarbamol, ondansetron  (ZOFRAN ) IV **OR** promethazine, mouth rinse,  oxyCODONE , oxyCODONE , pentafluoroprop-tetrafluoroeth    Objective: Weight change:   Intake/Output Summary (Last 24 hours) at 09/29/2024 1425 Last data filed at 09/29/2024 0752 Gross per 24 hour  Intake 120 ml  Output 0 ml  Net 120 ml   Blood pressure (!) 154/61, pulse 76, temperature 97.8 F (36.6 C), resp. rate 18, height 5' 5 (1.651 m), weight 89.6 kg, SpO2 100%. Temp:  [97.8 F (36.6 C)-98.8 F (37.1 C)] 97.8 F (36.6 C) (10/29 1400) Pulse Rate:  [71-81] 76 (10/29 1415) Resp:  [16-18] 18 (10/29 1415) BP: (118-174)/(61-85) 154/61 (10/29 1415) SpO2:  [99 %-100 %] 100 % (10/29 1415) Weight:  [89.6 kg] 89.6 kg (10/29 1400)  Physical Exam: Physical Exam Constitutional:      General: She is not in acute distress.    Appearance: She is well-developed. She is not diaphoretic.  HENT:     Head: Normocephalic and  atraumatic.     Right Ear: External ear normal.     Left Ear: External ear normal.     Mouth/Throat:     Pharynx: No oropharyngeal exudate.  Eyes:     General: No scleral icterus.    Conjunctiva/sclera: Conjunctivae normal.     Pupils: Pupils are equal, round, and reactive to light.  Cardiovascular:     Rate and Rhythm: Normal rate and regular rhythm.  Pulmonary:     Effort: Pulmonary effort is normal. No respiratory distress.     Breath sounds: No wheezing.  Abdominal:     General: There is no distension.     Palpations: Abdomen is soft.  Lymphadenopathy:     Cervical: No cervical adenopathy.  Skin:    General: Skin is warm and dry.     Coloration: Skin is not pale.     Findings: No erythema or rash.  Neurological:     General: No focal deficit present.     Mental Status: She is alert and oriented to person, place, and time.     Motor: No abnormal muscle tone.     Coordination: Coordination abnormal.  Psychiatric:        Mood and Affect: Mood normal.        Behavior: Behavior normal.        Thought Content: Thought content normal.        Judgment: Judgment normal.    BKA site wrapped right foot wrapped  CBC:    BMET Recent Labs    09/28/24 0419 09/29/24 0434  NA 132* 133*  K 3.8 4.4  CL 93* 91*  CO2 27 25  GLUCOSE 197* 142*  BUN 41* 53*  CREATININE 6.07* 7.92*  CALCIUM  8.7* 8.6*     Liver Panel  No results for input(s): PROT, ALBUMIN , AST, ALT, ALKPHOS, BILITOT, BILIDIR, IBILI in the last 72 hours.     Sedimentation Rate Recent Labs    09/29/24 0434  ESRSEDRATE >140*   C-Reactive Protein Recent Labs    09/29/24 0434  CRP 21.4*    Micro Results: Recent Results (from the past 720 hours)  Culture, blood (Routine x 2)     Status: None   Collection Time: 09/06/24  4:33 PM   Specimen: BLOOD RIGHT ARM  Result Value Ref Range Status   Specimen Description BLOOD RIGHT ARM  Final   Special Requests   Final    BOTTLES DRAWN  AEROBIC AND ANAEROBIC Blood Culture adequate volume   Culture   Final    NO GROWTH 5 DAYS Performed at [facility]  [facility] Lab, [address], [place], [place] [address]    Report Status 09/11/2024 FINAL  Final  Resp panel by RT-PCR (RSV, Flu A&B, Covid) Anterior Nasal Swab     Status: None   Collection Time: 09/06/24  6:54 PM   Specimen: Anterior Nasal Swab  Result Value Ref Range Status   SARS Coronavirus 2 by RT PCR NEGATIVE NEGATIVE Final   Influenza A by PCR NEGATIVE NEGATIVE Final   Influenza B by PCR NEGATIVE NEGATIVE Final    Comment: (NOTE) The Xpert Xpress SARS-CoV-2/FLU/RSV plus assay is intended as an aid in the diagnosis of influenza from Nasopharyngeal swab specimens and should not be used as a sole basis for treatment. Nasal washings and aspirates are unacceptable for Xpert Xpress SARS-CoV-2/FLU/RSV testing.  Fact Sheet for Patients: bloggercourse.com  Fact Sheet for Healthcare Providers: seriousbroker.it  This test is not yet approved or cleared by the [place]  FDA and has been authorized for detection and/or diagnosis of SARS-CoV-2 by FDA under an Emergency Use Authorization (EUA). This EUA will remain in effect (meaning this test can be used) for the duration of the COVID-19 declaration under Section 564(b)(1) of the Act, 21 U.S.C. section 360bbb-3(b)(1), unless the authorization is terminated or revoked.     Resp Syncytial Virus by PCR NEGATIVE NEGATIVE Final    Comment: (NOTE) Fact Sheet for Patients: bloggercourse.com  Fact Sheet for Healthcare Providers: seriousbroker.it  This test is not yet approved or cleared by the [place]  FDA and has been authorized for detection and/or diagnosis of SARS-CoV-2 by FDA under an Emergency Use Authorization (EUA). This EUA will remain in effect (meaning this test can be used) for the duration of the COVID-19  declaration under Section 564(b)(1) of the Act, 21 U.S.C. section 360bbb-3(b)(1), unless the authorization is terminated or revoked.  Performed at [facility], [address], [place], [place] [address]   Aerobic/Anaerobic Culture w Gram Stain (surgical/deep wound)     Status: None   Collection Time: 09/10/24  8:00 AM   Specimen: Foot, Left; Tissue  Result Value Ref Range Status   Specimen Description TISSUE  Final   Special Requests LEFT FOREFOOT  Final   Gram Stain NO WBC SEEN FEW GRAM POSITIVE COCCI IN PAIRS   Final   Culture   Final    ABUNDANT MORGANELLA MORGANII ABUNDANT PROTEUS MIRABILIS MODERATE ENTEROCOCCUS FAECALIS NO ANAEROBES ISOLATED Performed at [facility], [address] [place], [place] [address]    Report Status 09/17/2024 FINAL  Final   Organism ID, Bacteria MORGANELLA MORGANII  Final   Organism ID, Bacteria PROTEUS MIRABILIS  Final   Organism ID, Bacteria ENTEROCOCCUS FAECALIS  Final      Susceptibility   Enterococcus faecalis - MIC*    AMPICILLIN <=2 SENSITIVE Sensitive     VANCOMYCIN  1 SENSITIVE Sensitive     GENTAMICIN SYNERGY RESISTANT Resistant     * MODERATE ENTEROCOCCUS FAECALIS   Morganella morganii - MIC*    AMPICILLIN >=32 RESISTANT Resistant     ERTAPENEM <=0.12 SENSITIVE Sensitive     CIPROFLOXACIN  <=0.06 SENSITIVE Sensitive     GENTAMICIN <=1 SENSITIVE Sensitive     MEROPENEM <=0.25 SENSITIVE Sensitive     TRIMETH/SULFA <=20 SENSITIVE Sensitive     AMPICILLIN/SULBACTAM >=32 RESISTANT Resistant     PIP/TAZO Value in next row Sensitive      <=4 SENSITIVEThis is a modified FDA-approved test that has been validated and its performance characteristics determined by the reporting laboratory.  This laboratory is certified under the Clinical Laboratory Improvement Amendments CLIA as qualified to perform high complexity clinical laboratory testing.    * ABUNDANT MORGANELLA MORGANII   Proteus mirabilis - MIC*    AMPICILLIN Value in  next row Sensitive      <=4 SENSITIVEThis is a modified FDA-approved test that has been validated and its performance characteristics determined by the reporting laboratory.  This laboratory is certified under the Clinical Laboratory Improvement Amendments CLIA as qualified to perform high complexity clinical laboratory testing.    CEFAZOLIN  (NON-URINE) Value in next row Intermediate      <=4 SENSITIVEThis is a modified FDA-approved test that has been validated and its performance characteristics determined by the reporting laboratory.  This laboratory is certified under the Clinical Laboratory Improvement Amendments CLIA as qualified to perform high complexity clinical laboratory testing.    CEFEPIME Value in next row Sensitive      <=4 SENSITIVEThis is a modified FDA-approved test that has been validated and its performance characteristics determined by the reporting laboratory.  This laboratory is certified under the Clinical Laboratory Improvement Amendments CLIA as qualified to perform high complexity clinical laboratory testing.    ERTAPENEM Value in next row Sensitive      <=4 SENSITIVEThis is a modified FDA-approved test that has been validated and its performance characteristics determined by the reporting laboratory.  This laboratory is certified under the Clinical Laboratory Improvement Amendments CLIA as qualified to perform high complexity clinical laboratory testing.    CEFTRIAXONE  Value in next row Sensitive      <=4 SENSITIVEThis is a modified FDA-approved test that has been validated and its performance characteristics determined by the reporting laboratory.  This laboratory is certified under the Clinical Laboratory Improvement Amendments CLIA as qualified to perform high complexity clinical laboratory testing.    CIPROFLOXACIN  Value in next row Sensitive      <=4 SENSITIVEThis is a modified FDA-approved test that has been validated and its performance characteristics determined by the  reporting laboratory.  This laboratory is certified under the Clinical Laboratory Improvement Amendments CLIA as qualified to perform high complexity clinical laboratory testing.    GENTAMICIN Value in next row Sensitive      <=4 SENSITIVEThis is a modified FDA-approved test that has been validated and its performance characteristics determined by the reporting laboratory.  This laboratory is certified under the Clinical Laboratory Improvement Amendments CLIA as qualified to perform high complexity clinical laboratory testing.    MEROPENEM Value in next row Sensitive      <=4 SENSITIVEThis is a modified FDA-approved test that has been validated and its performance characteristics determined by the reporting laboratory.  This laboratory is certified under the Clinical Laboratory Improvement Amendments CLIA as qualified to perform high complexity clinical laboratory testing.    TRIMETH/SULFA Value in next row Resistant      <=4 SENSITIVEThis is a modified FDA-approved test that has been validated and its performance characteristics determined by the reporting laboratory.  This laboratory is certified under the Clinical Laboratory Improvement Amendments CLIA as qualified to perform high complexity clinical laboratory testing.    AMPICILLIN/SULBACTAM Value in next row Sensitive      <=4 SENSITIVEThis is a modified FDA-approved test that has been validated and its performance characteristics determined by the reporting laboratory.  This laboratory is certified under the Clinical Laboratory Improvement Amendments CLIA as qualified to perform high complexity clinical laboratory testing.    PIP/TAZO Value in next row Sensitive      <=  4 SENSITIVEThis is a modified FDA-approved test that has been validated and its performance characteristics determined by the reporting laboratory.  This laboratory is certified under the Clinical Laboratory Improvement Amendments CLIA as qualified to perform high complexity clinical  laboratory testing.    * ABUNDANT PROTEUS MIRABILIS  Culture, blood (Routine x 2)     Status: None   Collection Time: 09/16/24  5:10 PM   Specimen: BLOOD RIGHT ARM  Result Value Ref Range Status   Specimen Description BLOOD RIGHT ARM  Final   Special Requests   Final    BOTTLES DRAWN AEROBIC AND ANAEROBIC Blood Culture results may not be optimal due to an inadequate volume of blood received in culture bottles   Culture   Final    NO GROWTH 5 DAYS Performed at [facility], [address] [place], [place] [address]    Report Status 09/21/2024 FINAL  Final  Culture, blood (Routine x 2)     Status: None   Collection Time: 09/16/24  5:42 PM   Specimen: BLOOD RIGHT ARM  Result Value Ref Range Status   Specimen Description BLOOD RIGHT ARM  Final   Special Requests   Final    BOTTLES DRAWN AEROBIC AND ANAEROBIC Blood Culture results may not be optimal due to an inadequate volume of blood received in culture bottles   Culture   Final    NO GROWTH 5 DAYS Performed at [facility], [address] [place], [place] [address]    Report Status 09/21/2024 FINAL  Final  Body fluid culture w Gram Stain     Status: None   Collection Time: 09/16/24  9:07 PM   Specimen: Peritoneal Washings; Body Fluid  Result Value Ref Range Status   Specimen Description PERITONEAL FLUID  Final   Special Requests Immunocompromised  Final   Gram Stain NO ORGANISMS SEEN NO WBC SEEN CYTOSPIN SMEAR   Final   Culture   Final    NO GROWTH 3 DAYS Performed at [facility], [address] [place], [place] [address]    Report Status 09/20/2024 FINAL  Final  MRSA Next Gen by PCR, Nasal     Status: None   Collection Time: 09/17/24  4:48 AM   Specimen: Nasal Mucosa; Nasal Swab  Result Value Ref Range Status   MRSA by PCR Next Gen NOT DETECTED NOT DETECTED Final    Comment: (NOTE) The GeneXpert MRSA Assay (FDA approved for NASAL specimens only), is one component of a comprehensive MRSA  colonization surveillance program. It is not intended to diagnose MRSA infection nor to guide or monitor treatment for MRSA infections. Test performance is not FDA approved in patients less than 39 years old. Performed at [facility], [address] [place], [place] [address]   Culture, blood (Routine X 2) w Reflex to ID Panel     Status: None (Preliminary result)   Collection Time: 09/25/24  5:19 AM   Specimen: BLOOD RIGHT HAND  Result Value Ref Range Status   Specimen Description BLOOD RIGHT HAND  Final   Special Requests   Final    BOTTLES DRAWN AEROBIC AND ANAEROBIC Blood Culture adequate volume   Culture   Final    NO GROWTH 4 DAYS Performed at [facility], [address] [place], [place] [address]    Report Status PENDING  Incomplete  Culture, blood (Routine X 2) w Reflex to ID Panel     Status: None (  Preliminary result)   Collection Time: 09/25/24  5:22 AM   Specimen: BLOOD RIGHT ARM  Result Value Ref Range Status   Specimen Description BLOOD RIGHT ARM  Final   Special Requests   Final    BOTTLES DRAWN AEROBIC AND ANAEROBIC Blood Culture adequate volume   Culture   Final    NO GROWTH 4 DAYS Performed at [facility], [address] [place], [place] [address]    Report Status PENDING  Incomplete    Studies/Results: No results found.    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 Malvin October 10  Cultures yielded Morganella  as well as Proteus and Enterococcus faecalis in the hospitalization with Zosyn  and vancomycin  then Zosyn  alone and then discharged on oral ciprofloxacin   Unfortunate develops critical ischemic findings on the other side or went left below the knee amputation.  During readmission she was treated cefepime and vancomycin  to the 16th and was going to be discharged and had low-grade fevers and was placed on oral Augmentin MRI of the foot was repeated which showed findings concerning for osteomyelitis in the head of the distal third proximal phalanx where she had had the amputation.  The MRI feeding reading is course difficult in the sense that she recently had an amputation and this findings could be consistent with surgery or osteomyelitis that is still remaining.  Her inflammatory markers in particular her sed rate which is greater than 140 now versus 114 in early October is disconcerting, ERP is now up from normal at 0.9-22.6  She has remained stable however off antibiotics now into her 4th day  Dr Malvin has examined the patient and feels she is stable for DC with follow-up with him if she has yet relocated to [place] .  I recommend that she promptly establish care with podiatry infectious disease and vascular in [place] .  I personally spent a total of 50 minutes in the care of the patient today including  preparing to see the patient, performing a medically appropriate exam/evaluation, counseling and educating, placing orders, referring and communicating with other health care professionals, documenting clinical information in the EHR, communicating results, and coordinating care.   Evaluation of the patient requires complex antimicrobial therapy evaluation, counseling , isolation needs to reduce disease transmission and risk assessment and mitigation.    We will sign off for now.     LOS: 13 days   Karen [name] 09/29/2024, 2:25 PM

## 2024-09-29 NOTE — Progress Notes (Signed)
 Physical Therapy Treatment Patient Details Name: Karen [name] MRN: [id number] DOB: [birth year] Today's Date: 09/29/2024   History of Present Illness Karen [name] is a 48 y.o. female admitted on 09/16/24 with infected left diabetic foot wound. 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    PT Comments  Continuing work on functional mobility and activity tolerance;  Session focused on education re: safe transfers, important therex to prep for prosthesis training, considerations for positioning; Questions solicited and answered;  Pt requesting HEP; will return with one printed; tells PT team that she plans to dc to sister's home in [place], and undergo HHPT while prepping for trip up to [place];   Will plan to return later to deliver HEP   If plan is discharge home, recommend the following: A little help with walking and/or transfers;A little help with bathing/dressing/bathroom;Assistance with cooking/housework;Assist for transportation;Help with stairs or ramp for entrance   Can travel by private vehicle     Yes  Equipment Recommendations  Wheelchair (measurements PT);Wheelchair cushion (measurements PT);Other (comment) (REports her current wheelchair is in disrepair)    Recommendations for Other Services       Precautions / Restrictions Precautions Precautions: Fall Recall of Precautions/Restrictions: Intact Required Braces or Orthoses:  (limb guard for LLE and post op shoe for RLE) Restrictions RLE Weight Bearing Per Provider Order: Weight bearing as tolerated LLE Weight Bearing Per Provider Order: Non weight bearing Other Position/Activity Restrictions: WBAT R foot with post op shoe, NWB LLE with  limb protector     Mobility  Bed Mobility                    Transfers                        Ambulation/Gait                   Stairs             Wheelchair Mobility     Tilt Bed    Modified Rankin (Stroke Patients Only)       Balance                                            Communication Communication Communication: No apparent difficulties  Cognition Arousal: Alert Behavior During Therapy: WFL for tasks assessed/performed   PT - Cognitive impairments: No apparent impairments                         Following commands: Intact      Cueing    Exercises Other Exercises Other Exercises: Bolstered bridging x5; min assist to steady bolsters under LLE; Able ot perform 1 rep without physical assist    General Comments General comments (skin integrity, edema, etc.): Took time to answer questions re: dc planning, therex, pre-prosthesis positioning      Pertinent Vitals/Pain Pain Assessment Pain Assessment: Faces Faces Pain Scale: No hurt Pain Intervention(s): Monitored during session    Home Living  Prior Function            PT Goals (current goals can now be found in the care plan section) Acute Rehab PT Goals Patient Stated Goal: Be able to get to [place]  to stay with family PT Goal Formulation: With patient Time For Goal Achievement: 10/05/24 Potential to Achieve Goals: Good Progress towards PT goals: Progressing toward goals    Frequency    Min 3X/week      PT Plan      Co-evaluation              AM-PAC PT 6 Clicks Mobility   Outcome Measure  Help needed turning from your back to your side while in a flat bed without using bedrails?: A Little Help needed moving from lying on your back to sitting on the side of a flat bed without using bedrails?: A Little Help needed moving to and from a bed to a chair (including a wheelchair)?: A  Little Help needed standing up from a chair using your arms (e.g., wheelchair or bedside chair)?: A Little Help needed to walk in hospital room?: A Lot Help needed climbing 3-5 steps with a railing? : Total 6 Click Score: 15    End of Session Equipment Utilized During Treatment: Other (comment) (Bolster for exercise) Activity Tolerance: Patient tolerated treatment well Patient left: in bed;Other (comment) (undergoing HD) Nurse Communication: Mobility status PT Visit Diagnosis: Unsteadiness on feet (R26.81);Other abnormalities of gait and mobility (R26.89);Muscle weakness (generalized) (M62.81);Difficulty in walking, not elsewhere classified (R26.2);Other symptoms and signs involving the nervous system (R29.898);Pain Pain - Right/Left: Left Pain - part of body: Leg     Time: 1600-1630 PT Time Calculation (min) (ACUTE ONLY): 30 min  Charges:    $Therapeutic Exercise: 8-22 mins $Self Care/Home Management: 8-22 PT General Charges $$ ACUTE PT VISIT: 1 Visit                     Silvano Currier, PT  Acute Rehabilitation Services Office [phone] Secure Chat welcomed    Silvano VEAR Currier 09/29/2024, 5:10 PM

## 2024-09-29 NOTE — Progress Notes (Signed)
 Called and verified with the pharmacist, Vanna, that the patient's Robaxin order was sent to [facility] on [address] in [place], [place] per the patient's request.  Suzen Ice RN

## 2024-09-29 NOTE — Progress Notes (Signed)
 [facility] Progress Note   Subjective:    Seen in room Still having sharp, shooting post-op pains  Objective Vitals:   09/28/24 1653 09/28/24 2125 09/29/24 0532 09/29/24 0752  BP: 139/76 118/68 (!) 140/76 (!) 174/85  Pulse: 78 71 78 81  Resp: 18 18 16 18   Temp: 98.8 F (37.1 C) 98.6 F (37 C) 98.1 F (36.7 C) 98.2 F (36.8 C)  TempSrc:   Axillary Oral  SpO2:  99%  100%  Weight:      Height:       Physical Exam General: Alert female in NAD, on RA Heart: RRR, no murmurs, rubs or gallops Lungs: CTA bilaterally, respirations unlabored Abdomen: Soft, non-distended, +BS Extremities: L BKA, R foot wrapped, no edema  Dialysis Access: AVF + t/b, PD cath in abdomen   Dialysis Orders: MWF [facility]-->  4h  B400   90kg 2K bath  AVF  Heparin  none  Assessment/Plan: Left foot osteomyelitis: w/ PAD. Prior TMA that worsened an is now SP L BKA done 10/20 by [facility].  ESRD: on HD MWF. HD today.  PD cath: she has a PD cath but has not started PD yet, PD cath flushed 10/24 and 10/27.  Anemia: Hgb has dropped to 7- 8 range. Last tsat at 13% on 10/10. No recent IV Fe, but is not a candidate for now w/ active infection. Started ESA w/ darbe 100 mcg weekly.  CKD-MBD: Calcium  level variable, not on VDRA. Phos variable. Continue lanthanum . On sensipar  for PTH Hypertension: Blood pressure currently controlled   Rob Geralynn  MD  [facility] 09/29/2024, 12:18 PM  Recent Labs  Lab 09/24/24 0955 09/25/24 0519 09/27/24 0441 09/27/24 1332 09/28/24 0419 09/29/24 0434  HGB 7.4*   < >  --    < > 7.6* 8.0*  ALBUMIN  2.2*  --   --   --   --   --   CALCIUM  8.2*   < > 8.3*  --  8.7* 8.6*  PHOS 6.1*  --  7.1*  --   --   --   CREATININE 7.17*   < > 8.74*  --  6.07* 7.92*  K 4.8   < > 5.4*  --  3.8 4.4   < > = values in this interval not displayed.    Inpatient medications:  (feeding supplement) PROSource Plus  30 mL Oral BID BM   aspirin  EC  81 mg Oral Daily   brimonidine  1 drop Both Eyes  TID   Chlorhexidine  Gluconate Cloth  6 each Topical Q0600   Chlorhexidine  Gluconate Cloth  6 each Topical Q0600   cinacalcet   90 mg Oral Q breakfast   darbepoetin (ARANESP ) injection - DIALYSIS  100 mcg Subcutaneous Q Tue-1800   gabapentin   100 mg Oral QHS   gabapentin   200 mg Oral Daily   gentamicin cream   Topical Once   insulin  pump   Subcutaneous TID WC, HS, 0200   Lanthanum  Carbonate  2,000 mg Oral TID WC   metoprolol  tartrate  12.5 mg Oral 2 times per day on Sunday Tuesday Thursday Saturday   pantoprazole  40 mg Oral Daily   polyethylene glycol  17 g Oral BID   senna-docusate  2 tablet Oral BID   SMOG  400 mL Rectal Once    anticoagulant sodium citrate      acetaminophen  **OR** acetaminophen , albuterol , alteplase , anticoagulant sodium citrate , feeding supplement (NEPRO CARB STEADY), heparin , HYDROmorphone  (DILAUDID ) injection, lanthanum , lidocaine  (PF), lidocaine -prilocaine , methocarbamol, ondansetron  (ZOFRAN ) IV **  OR** promethazine, mouth rinse, oxyCODONE , oxyCODONE , pentafluoroprop-tetrafluoroeth

## 2024-09-29 NOTE — Progress Notes (Signed)
    Durable Medical Equipment  (From admission, onward)           Start     Ordered   09/29/24 1547  For home use only DME lightweight manual wheelchair with seat cushion  Once       Comments: Patient suffers from left BKA and right toe amputation which impairs their ability to perform daily activities like bathing, dressing, feeding, grooming, and toileting in the home.  A cane, crutch, or walker will not resolve  issue with performing activities of daily living. A wheelchair will allow patient to safely perform daily activities. Patient is not able to propel themselves in the home using a standard weight wheelchair due to endurance and general weakness. Patient can self propel in the lightweight wheelchair. Length of need Lifetime. Accessories: elevating leg rests (ELRs), wheel locks, extensions and anti-tippers.   09/29/24 1547   09/23/24 1405  For home use only DME Other see comment  Once       Comments: Long Sliding Board  Question:  Length of Need  Answer:  Lifetime   09/23/24 1405

## 2024-09-29 NOTE — Progress Notes (Signed)
 Pt has refused dialysis this AM. Charge nurse of [facility] informed.

## 2024-09-29 NOTE — Progress Notes (Signed)
  Subjective:  Patient ID: Karen [name], female    DOB: July 17, 1976,  MRN: [id number]  Chief Complaint  Patient presents with   Post-op Problem    DOS: 09/10/2024 Procedure: 1. Amputation of right third toe at PIPJ level 2. Transmetatarsal amputation of left foot  48 y.o. female seen for post op check.  She is now s/p L BKA. She is 2.5 weeks out, in HD. Discussed follow up plans. She agrees with plan to continue monitoring and is happy no further surgery at this time.   Review of Systems: Negative except as noted in the HPI. Denies N/V/F/Ch.   Objective:   Constitutional Well developed. Well nourished.  Vascular Foot warm and well perfused. Capillary refill normal to all digits.   No calf pain with palpation  Neurologic Normal speech. Oriented to person, place, and time. Epicritic sensation diminished to bilateral forefoot  Dermatologic Right foot amputation site at the third toe PIPJ level is doing very well healing well with no dehiscence and no drainage no erythema and no necrosis, no maceration. Dorsal wound healing with eschar overlying.      Orthopedic: Status post left foot transmetatarsal amputation, status post right partial third toe amputation   Radiographs: Right foot postop: 1. Interval surgical amputation of the third middle and distal phalanges. 2. Stable postsurgical changes involving the fifth metatarsal. 3. Stable chronic changes of the midfoot. 4. Vascular calcifications.  Left foot postop: 1. Status post amputation of all five toes at the mid metatarsal level. 2. Vascular calcifications.    Pathology:  A. TOE, RIGHT THIRD, AMPUTATION:  - Toe, clinically right third, showing skin with ulceration and  associated necrotizing inflammation  - Moderate to severe calcific atherosclerosis of small to medium-caliber arteries  - Bone at the resection margin does not show evidence of acute  osteomyelitis   Micro:   ABUNDANT MORGANELLA  MORGANII ABUNDANT PROTEUS MIRABILIS    Assessment:   1. Sepsis, due to unspecified organism, unspecified whether acute organ dysfunction present (HCC)   2. 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  2 weeks  s/p left foot transmetatarsal amputation and partial right third toe amputation.  s/p L BKA with vascular.  - Reviewed again today, 3rd toe amputation site appears healthy and viable without evidence of infection.  -  Recommend  band aid dressing prn to right 3rd toe, ok to wear sock alone also -XR: Expected postop changes -WB Status:Weightbearing as tolerated to right foot in postop shoe -Sutures: Previously removed, no dehiscence   -Medications/ABX: Abx per ID, appreciate recs -Dressing:   band aid PRN to amp site on 3rd toe. Ok to wash the right foot prn at this time. - F/u Plan: Patient will follow-up in the office with me next week 11/6 at 10:45 - she is moving to [facility] when has HD arranged - I have also sent referral to podiatry office there for ongoing follow up when she moves. Will sign off.         Karen [name], DPM [facility] / [facility]

## 2024-09-29 NOTE — Discharge Summary (Signed)
 Physician Discharge Summary   Patient: Karen [name] MRN: [id number] DOB: [birth year]  Admit date:     09/16/2024  Discharge date: 09/29/24  Discharge Physician: Yetta Blanch  PCP: Lesa Jon HERO, PA  Recommendations at discharge: Follow-up with vascular surgery as recommended for staple removal. Follow-up with podiatry as recommended for wound follow-up. Follow-up with ID as recommended. Repeat CRP in 1 to 2 weeks.   Follow-up Information     [facility] Follow up.   Specialty: Home Health Services Why: Someone will call you to schedule resumption of care visit. Contact information: [address] [place] [address] [phone]         Lesa Jon HERO, PA. Schedule an appointment as soon as possible for a visit in 1 week(s).   Specialty: Physician Assistant Why: repeat CRP in 1-2 weeks. Contact information: [address] [place] [address] [phone]         Fleeta Rothman, Jomarie SAILOR, MD. Go to.   Specialty: Infectious Diseases Why: the Appointment As Scheduled Contact information: [address] [place] [address] [phone]         Malvin Marsa FALCON, DPM. Go to.   Specialty: Podiatry Why: the Appointment As Scheduled Contact information: [address] [place] [address] [phone]         [facility]. Go to.   Why: the Appointment As Scheduled Contact information: [address] [place]  [address] [phone]               Hospital Course: 48 y.o. female with history of ESRD on hemodialysis has had recent peritoneal catheter placement with history of CAD status post PCI in February 2025 who was recently admitted to the hospital for diabetic foot infection underwent left foot transmetatarsal amputation and right foot third toe amputation on September 10, 2024 and also underwent angiogram with [facility] angioplasty  of the left popliteal artery was discharged home on September 13, 2024 has been experiencing weakness fever chills last 2-3 days.  In ER patient was noted to have a fever, tachycardia, leukocytosis and discolored transmetatarsal flap.  Patient was started on empiric antibiotics.  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   Assessment and plan. Critical limb ischemia of left foot. S/p left foot TMA 10/10 for diabetic left foot infection/osteomyelitis. 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 S/p left BKA under regional anesthesia on 10/20 by vascular surgery. Continue empirically started IV vancomycin  and cefepime.  Postop antibiotics, choice and duration deferred to vascular surgery. Remains on aspirin  and Plavix .  Intolerant to statins. Outpatient follow-up with vascular surgery recommended.   Constipation. Nausea and vomiting Intermittent. Recommended to continue bowel regimen with pain medication.   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 Reasonably controlled in house on her insulin  pump. Continue current dose of gabapentin .   ESRD on MWF HD Hyperkalemia Nephrology following for HD needs. Hyperkalemia resolved.   Right hip pain Patient feels that she twisted her hip at home.  X-rays right hip and lumbar spine without acute findings. Continue multimodality pain control including gabapentin , lidocaine  patch.  Requested PT evaluation-input pending.  Better.   Essential hypertension Mostly controlled on metoprolol  to tartrate 12.5 mg twice daily on nondialysis days.  Uncontrolled this morning but might have been related to pain.  Did not see repeat BP documented since then   Anemia of ESRD Stable.  Trend CBC.   S/p right third toe amputation Podiatry was  consulted. As the patient continues to report severe pain, wound care and ID were also consulted. ESR and CRP remain significantly elevated.  ESR more than the detectable range and CRP more than 21. Podiatry feels that the wounds are healing well and for now recommend daily dressing changes with outpatient follow-up. Patient was observed in the hospital off of the antibiotic for 3 days and remained afebrile with improvement in pain. would recommend Xeroform 4 x 4 gauze Kerlix Ace roll.  Sutures will come out in 1 to 2 weeks.  WBAT on right foot in postop shoe.  Outpatient follow-up with Dr. Malvin.  Obesity Class 1 Body mass index is 32.21 kg/m.  Placing the pt at higher risk of poor outcomes.   Pain control - [place]  Controlled Substance Reporting System database was reviewed. and patient was instructed, not to drive, operate heavy machinery, perform activities at heights, swimming or participation in water activities or provide baby-sitting services while on Pain, Sleep and Anxiety Medications; until their outpatient Physician has advised to do so again. Also recommended to not to take more than prescribed Pain, Sleep and Anxiety Medications.  Consultants:  Vascular surgery Podiatry ID  Procedures performed:  Echocardiogram Left BKA  DISCHARGE MEDICATION: Allergies as of 09/29/2024       Reactions   Furosemide Swelling   Heparin  Other (See Comments)   Patient relates vitreous hemorrhage after heparin . Patient relates vitreous hemorrhage after heparin .        Medication List     STOP taking these medications    ciprofloxacin  500 MG tablet Commonly known as: Cipro    ethyl chloride spray   ketoconazole 2 % shampoo Commonly known as: NIZORAL   Santyl  250 UNIT/GM ointment Generic drug: collagenase        TAKE these medications    albuterol  108 (90 Base) MCG/ACT inhaler Commonly known as: VENTOLIN  HFA Inhale 1-2 puffs into the lungs every 6 (six) hours  as needed for wheezing or shortness of breath. Notes to patient: You are able to begin this medication today, 10/24.Take this medication as needed, only.    aspirin  EC 81 MG tablet Take 1 tablet (81 mg total) by mouth daily. Swallow whole.   brimonidine 0.2 % ophthalmic solution Commonly known as: ALPHAGAN Place 1 drop into the right eye in the morning and at bedtime.   cinacalcet  30 MG tablet Commonly known as: SENSIPAR  Take 90 mg by mouth daily with breakfast. After hemodialysis   clopidogrel  75 MG tablet Commonly known as: PLAVIX  Take 1 tablet (75 mg total) by mouth daily.   feeding supplement (NEPRO CARB STEADY) Liqd Take 237 mLs by mouth as needed (missed meal during dialysis.).   (feeding supplement) PROSource Plus liquid Take 30 mLs by mouth 2 (two) times daily between meals.   Fosrenol  1000 MG Pack Generic drug: Lanthanum  Carbonate Take 1,000-2,000 mg by mouth See admin instructions. Take 2 packets (2000 mg) by mouth with each meal & take 1 packet (1000 mg) by mouth with each snack   gabapentin  100 MG capsule Commonly known as: NEURONTIN  Take 100-200 mg by mouth See admin instructions. Take 2 capsules (200 mg) by mouth every morning & take 1 capsule (100 mg) by mouth at bedtime.   glucagon 1 MG injection Inject 1 mg  into the muscle once as needed (low blood sugar).   insulin  aspart 100 UNIT/ML injection Commonly known as: novoLOG  Inject 0-10 Units into the skin See admin instructions. Via insulin  pump   lidocaine  5 % Commonly known as: LIDODERM  Place 1 patch onto the skin daily as needed (for pain). Notes to patient: You are able to begin this medication today, 10/24.Take this medication as needed, only.    methocarbamol 750 MG tablet Commonly known as: ROBAXIN Take 1 tablet (750 mg total) by mouth every 8 (eight) hours as needed for up to 13 days for muscle spasms.   metoprolol  tartrate 25 MG tablet Commonly known as: LOPRESSOR  Take (12.5mg ) half tablet  twice a day except on dialysis days What changed:  how much to take how to take this when to take this additional instructions   oxyCODONE -acetaminophen  10-325 MG tablet Commonly known as: Percocet Take 1 tablet by mouth every 4 (four) hours as needed for pain.   pantoprazole 40 MG tablet Commonly known as: PROTONIX Take 1 tablet (40 mg total) by mouth daily.   polyethylene glycol powder 17 GM/SCOOP powder Commonly known as: GLYCOLAX/MIRALAX Take 17 g by mouth 2 (two) times daily. Dissolve 1 capful (17g) in 4-8 ounces of liquid and take by mouth daily.   RENA-VITE PO Take 1 tablet by mouth in the morning.   senna-docusate 8.6-50 MG tablet Commonly known as: Senokot-S Take 1 tablet by mouth 2 (two) times daily.               Durable Medical Equipment  (From admission, onward)           Start     Ordered   09/29/24 1547  For home use only DME lightweight manual wheelchair with seat cushion  Once       Comments: Patient suffers from left BKA and right toe amputation which impairs their ability to perform daily activities like bathing, dressing, feeding, grooming, and toileting in the home.  A cane, crutch, or walker will not resolve  issue with performing activities of daily living. A wheelchair will allow patient to safely perform daily activities. Patient is not able to propel themselves in the home using a standard weight wheelchair due to endurance and general weakness. Patient can self propel in the lightweight wheelchair. Length of need Lifetime. Accessories: elevating leg rests (ELRs), wheel locks, extensions and anti-tippers.   09/29/24 1547   09/23/24 1405  For home use only DME Other see comment  Once       Comments: Long Sliding Board  Question:  Length of Need  Answer:  Lifetime   09/23/24 1405              Discharge Care Instructions  (From admission, onward)           Start     Ordered   09/29/24 0000  Discharge wound care:        Comments: Band aid PRN to amputated site on 3rd toe. Ok to wash the right foot as needed at this time.  dry dressing on BKA site, not tight. Follow up in vascular surgery office in 4-5 weeks for staple removal.   09/29/24 1538           Disposition: Home Diet recommendation: Renal diet  Discharge Exam: Vitals:   09/29/24 1600 09/29/24 1630 09/29/24 1700 09/29/24 1706  BP: 127/68 111/78 123/62 (!) 111/59  Pulse: 78 84 79 79  Resp: 18 17 18 17   Temp:  98.1 F (36.7 C)  TempSrc:      SpO2: 100% 98% 100% 100%  Weight:    87.8 kg  Height:       Clear to auscultation. S1-S2 present. Bowel sound present, nontender. Bilateral lower extremity edema with mild warmth involving the right foot.  Filed Weights   09/27/24 1156 09/29/24 1400 09/29/24 1706  Weight: 87 kg 89.6 kg 87.8 kg   Condition at discharge: stable  The results of significant diagnostics from this hospitalization (including imaging, microbiology, ancillary and laboratory) are listed below for reference.   Imaging Studies: DG Abd Portable 1V Result Date: 09/29/2024 CLINICAL DATA:  Constipation. EXAM: PORTABLE ABDOMEN - 1 VIEW COMPARISON:  CT 09/17/2024 FINDINGS: Peritoneal dialysis catheter tip in the midline pelvis. Moderate volume of stool in the colon. No small bowel distension or evidence of obstruction. Cholecystectomy clips in the right upper quadrant. Chain sutures at the left lung base. IMPRESSION: Moderate colonic stool burden. No bowel obstruction. Electronically Signed   By: Andrea Gasman M.D.   On: 09/29/2024 15:05   DG Foot Complete Right Result Date: 09/25/2024 CLINICAL DATA:  Osteomyelitis of third toe. EXAM: RIGHT FOOT COMPLETE - 3+ VIEW COMPARISON:  MRI earlier today FINDINGS: No bony destructive changes of the third proximal phalanx to correspond to osteomyelitis on MRI. Previous resection of the third toe at the proximal interphalangeal joint. Overlying dressing in place. Previous  transmetatarsal amputation of the fifth ray. Chronic navicular fracture and disorganized articulation of the mid and hindfoot. IMPRESSION: 1. No bony destructive changes of the third proximal phalanx to correspond to osteomyelitis on MRI. 2. Previous resection of the third toe at the proximal interphalangeal joint. 3. Previous transmetatarsal amputation of the fifth ray. Electronically Signed   By: Andrea Gasman M.D.   On: 09/25/2024 16:07   MR FOOT RIGHT W WO CONTRAST Result Date: 09/25/2024 EXAM: MRI of the right Foot without and with contrast. 09/25/2024 05:56:29 AM TECHNIQUE: Multiplanar multisequence MRI of the right foot was performed without and with the administration of 8 mL gadobutrol (GADAVIST) 1 MMOL/ML injection. COMPARISON: Radiographs 09/16/2024 and MRI 09/07/2024. 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 BONE MARROW: Again seen are chronic postoperative changes from fifth-grade amputation at the level of the midshaft of the metatarsal bone. Postsurgical changes from 3rd grade amputation at the level of the PIP joint. On the T1 weighted sequences, there is abnormality low T1 signal within the head of the 3rd proximal phalanx with corresponding increased T2 signal. Coronal on the postcontrast images there is associated enhancement involving the head of the third proximal phalanx (axial image 14/10). Report image 13/7 and coronal image 13/6. There is fluid signal intensity surrounding the ray toe from the MTP joint through the distal aspect of the distal phalanx. There is no T1 signal abnormality within the bone to suggest osteomyelitis. 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 No aggressive marrow replacing lesion. GREATER AND LESSER MTP JOINTS: Postsurgical  changes from 3rd ray amputation at the level of the PIP joint. On the T1 weighted sequences, there is abnormality low T1 signal within the head of the 3rd proximal phalanx with corresponding increased T2 signal. On the postcontrast images there is associated enhancement involving the head of the third proximal phalanx (axial image 14/10). There is fluid signal intensity surrounding the great toe  from the MTP joint through the distal aspect of the distal phalanx. There is no T1 signal abnormality within the bone to suggest osteomyelitis. 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 SOFT TISSUES: Mild diffuse soft tissue edema about the foot. No discrete fluid collections identified to suggest abscess. TENDONS: Visualized flexor and extensor tendons are intact without tenosynovitis. IMPRESSION: 1. 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 2. No discrete drainable abscess identified. Electronically signed by: Waddell Calk MD 09/25/2024 09:56 AM EDT RP Workstation: [id number]   DG HIP UNILAT WITH PELVIS 1V RIGHT Result Date: 09/18/2024 CLINICAL DATA:  Right hip pain. EXAM: DG HIP (WITH OR WITHOUT PELVIS) 1V RIGHT COMPARISON:  CT 09/17/2024 FINDINGS: There is diffuse decreased bone mineralization present. Peritoneal dialysis catheter with tip over the right lower quadrant. Hips are within normal and symmetric. There is no significant degenerative changes. There is no acute fracture or dislocation. Moderate fecal retention over the rectosigmoid colon. IMPRESSION: No acute findings. Electronically Signed   By: Toribio Agreste M.D.   On: 09/18/2024 10:38   DG Lumbar Spine 1 View Result Date: 09/18/2024 CLINICAL DATA:  Right hip pain. EXAM: LUMBAR SPINE - 1 VIEW COMPARISON:  None Available. FINDINGS: Vertebral body alignment and heights over the lumbar spine are normal. Mild facet  arthropathy over the mid to lower lumbar spine. No evidence of compression fracture or spondylolisthesis involving the lumbar spine. Disc space heights over the lumbar spine are normal. Possible mild anterior wedging of T11 not adequately evaluated on this exam. IMPRESSION: 1. No acute findings. 2. Mild facet arthropathy over the mid to lower lumbar spine. 3. Possible mild anterior wedging of T11 not adequately evaluated on this exam. Electronically Signed   By: Toribio Agreste M.D.   On: 09/18/2024 10:35   CT RENAL STONE STUDY Result Date: 09/17/2024 EXAM: CT UROGRAM 09/17/2024 07:55:16 AM TECHNIQUE: CT of the abdomen and pelvis was performed without the administration of intravenous contrast as per CT urogram protocol. Multiplanar reformatted images are provided for review. Automated exposure control, iterative reconstruction, and/or weight based adjustment of the mA/kV was utilized to reduce the radiation dose to as low as reasonably achievable. COMPARISON: None available. CLINICAL HISTORY: Nausea vomiting. Sepsis (HCC). Post-op Problem. CT RENAL STONE STUDY. FINDINGS: LOWER CHEST: Mild left posterior basilar atelectasis or infiltrate is noted. LIVER: Status post cholecystectomy. GALLBLADDER AND BILE DUCTS: Status post cholecystectomy. No biliary ductal dilatation. SPLEEN: No acute abnormality. PANCREAS: Pancreatic atrophy is noted. ADRENAL GLANDS: No acute abnormality. KIDNEYS, URETERS AND BLADDER: Severe right renal atrophy is noted. No stones in the kidneys or ureters. No hydronephrosis. No perinephric or periureteral stranding. Urinary bladder is unremarkable. GI AND BOWEL: Status post appendectomy. Stomach demonstrates no acute abnormality. There is no bowel obstruction. PERITONEUM AND RETROPERITONEUM: Peritoneal dialysis catheter is noted in the pelvis. Minimal free fluid is noted in the abdomen and pelvis consistent with peritone analysis. VASCULATURE: Aortic atherosclerosis. Aorta is normal in caliber.  LYMPH NODES: No lymphadenopathy. REPRODUCTIVE ORGANS: Status post hysterectomy. BONES AND SOFT TISSUES: No acute osseous abnormality. No focal soft tissue abnormality. IMPRESSION: 1. Severe right renal atrophy. 2. Pancreatic atrophy. 3. Mild left posterior basilar atelectasis or infiltrate. Electronically signed by: Lynwood Seip MD 09/17/2024 08:05 AM EDT RP Workstation: [id number]   DG Foot 2 Views Right Result Date: 09/16/2024 CLINICAL DATA:  Fevers and chills.  Recent postop. EXAM: RIGHT FOOT - 2 VIEW COMPARISON:  09/10/2024 FINDINGS: Again noted is a metatarsal amputation involving the fifth ray. Partial amputation of the third toe. Chronic deformities involving the tarsal bones. Negative for an acute fracture or dislocation. IMPRESSION: 1. No acute abnormality in the right foot. 2. Chronic deformities involving the tarsal bones. 3. Postsurgical changes. Electronically Signed   By: Juliene Balder M.D.   On: 09/16/2024 18:47   DG Chest Port 1 View Result Date: 09/16/2024 CLINICAL DATA:  Questionable sepsis. Fevers and chills. Recent transmetatarsal amputation. EXAM: PORTABLE CHEST 1 VIEW COMPARISON:  06/21/2024 FINDINGS: Electronic device overlying the lateral left chest. Heart and mediastinum are stable. Prominent central vascular structures. Difficult to exclude mild edema. Chronic scarring at the left lung base. Negative for a pneumothorax. Trachea is midline. IMPRESSION: 1. Probable vascular congestion and difficult to exclude mild edema. 2. Evidence for scarring at the left lung base. Electronically Signed   By: Juliene Balder M.D.   On: 09/16/2024 18:42   DG Foot 2 Views Left Result Date: 09/16/2024 CLINICAL DATA:  History of transmetatarsal amputation. Patient now has fevers and chills. EXAM: LEFT FOOT - 2 VIEW COMPARISON:  None Available. FINDINGS: Status post transmetatarsal amputations. Surgical skin staples are present. Expected postoperative changes. No unexpected cortical destruction or periosteal  reaction. Diffuse vascular calcifications. No gross soft tissue abnormality. No acute fracture. IMPRESSION: Status post transmetatarsal amputations. Expected postoperative changes. No acute bone abnormality. Electronically Signed   By: Juliene Balder M.D.   On: 09/16/2024 18:37   DG Foot 2 Views Left Result Date: 09/10/2024 EXAM: 2 VIEW(S) XRAY OF THE LEFT FOOT 09/10/2024 08:59:00 AM COMPARISON: 09/06/2024 CLINICAL HISTORY: [id number] Post-operative state [id number]. Table formatting from the original note was not included.; Post-operative state [id number] Post-operative state 252351. Table formatting from the original note was not included.; Post-operative state [phone] FINDINGS: BONES AND JOINTS: Status post surgical amputation of all 5 rays at mid metatarsal level. SOFT TISSUES: Vascular calcifications are noted. IMPRESSION: 1. Status post amputation of all five toes at the mid metatarsal level. 2. Vascular calcifications. Electronically signed by: Lynwood Seip MD 09/10/2024 09:51 AM EDT RP Workstation: [id number]   DG Foot 2 Views Right Result Date: 09/10/2024 EXAM: 1 or 2 VIEW(S) XRAY OF THE RIGHT FOOT 09/10/2024 08:59:00 AM COMPARISON: 09/06/2024 CLINICAL HISTORY: Post-operative state [id number]. Table formatting from the original note was not included.; Post-operative state [phone] FINDINGS: BONES AND JOINTS: Interval surgical amputation of third middle and distal phalanges. Stable postsurgical changes seen involving fifth metatarsal. Stable chronic findings involving mid foot. No acute fracture. No focal osseous lesion. No joint dislocation. SOFT TISSUES: Vascular calcifications are noted. IMPRESSION: 1. Interval surgical amputation of the third middle and distal phalanges. 2. Stable postsurgical changes involving the fifth metatarsal. 3. Stable chronic changes of the midfoot. 4. Vascular calcifications. Electronically signed by: Lynwood Seip MD 09/10/2024 09:47 AM EDT RP Workstation: [id number]   US  EKG SITE RITE Result  Date: 09/09/2024 If Site Rite image not attached, placement could not be confirmed due to current cardiac rhythm.  PERIPHERAL VASCULAR CATHETERIZATION Result Date: 09/08/2024 Patient name: Simrit Gohlke MRN: [id number] DOB: 1976-02-11 Sex: female 09/08/2024 Pre-operative Diagnosis: Bilateral lower extremity critical limb ischemia with tissue loss at the toes, left greater than right Post-operative diagnosis:  Same Surgeon:  Fonda FORBES Rim, MD Procedure Performed: 1.  Ultrasound-guided micropuncture access of the right common femoral artery 2.  Aortogram 3.  Second-order cannulation, left lower extremity angiogram 4.  Left popliteal artery drug-coated balloon angioplasty 4 x 60 mm 5.  Moderate  sedation time 24 minutes, contrast volume 75 mL 6.  Device assisted closure-Mynx Indications: Patient is a 48 year old female with history of nonhealing wounds to bilateral lower extremities.  Tissue loss is worse on the left as compared to the right.  I have discussed the risk and benefits of left lower extremity angiogram in effort to define and improve distal perfusion to aid in wound healing, the patient elected to proceed. Findings: Aortogram: Patent left renal artery, right renal artery does not fill.  No significant disease in the aortoiliac segments bilaterally. On the left: Widely patent common femoral, profunda, superficial femoral artery.  There was mild disease in the superficial femoral artery with a 70% focal stenosis in the P1 segment of the popliteal artery.  The remainder the popliteal artery was widely patent.  Two-vessel runoff was anterior tibial artery dominant with the posterior tibial artery also feeling.  The dorsalis pedis provided the majority of flow to the foot.  The pedal arch was not intact.  Procedure:  The patient was identified in the holding area and taken to room 8.  The patient was then placed supine on the table and prepped and draped in the usual sterile fashion.  A time out was  called.  Ultrasound was used to evaluate the right common femoral artery.  It was patent .  A digital ultrasound image was acquired.  A micropuncture needle was used to access the right common femoral artery under ultrasound guidance.  An 018 wire was advanced without resistance and a micropuncture sheath was placed.  The 018 wire was removed and a benson wire was placed.  The micropuncture sheath was exchanged for a 5 french sheath.  An omniflush catheter was advanced over the wire to the level of L-1.  An abdominal angiogram was obtained.  Next, using the omniflush catheter and a benson wire, the aortic bifurcation was crossed and the catheter was placed into theleft external iliac artery and left runoff was obtained. I elected to attempt intervention on the left popliteal artery.  The patient was heparinized and a 6 x 45 cm sheath was brought into the field and parked in the left common femoral artery.  From this location.  A series of wires and catheters were used to cross the popliteal artery lesion.  Next, a 4 x 60 mm balloon was brought into the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

## 2024-09-29 NOTE — Progress Notes (Signed)
   09/29/24 1706  Vitals  Temp 98.1 F (36.7 C)  BP (!) 111/59  Pulse Rate 79  Resp 17  Weight 87.8 kg  Type of Weight Post-Dialysis  Oxygen Therapy  SpO2 100 %  O2 Device Room Air  Patient Activity (if Appropriate) In bed  Pulse Oximetry Type Continuous  Oximetry Probe Site Changed No  Post Treatment  Dialyzer Clearance Heavily streaked  Liters Processed 74.2  Fluid Removed (mL) 1800 mL  Tolerated HD Treatment Yes  Post-Hemodialysis Comments Patient system clottted with air. System stopped and offered patient to continue. Patient declined and is ready to be discharged home. Patient received education regarding early termination but has declined to restart her treatment. Vs are stable.Hemostasis achieved.  AVG/AVF Arterial Site Held (minutes) 5 minutes  AVG/AVF Venous Site Held (minutes) 5 minutes

## 2024-09-30 ENCOUNTER — Other Ambulatory Visit (HOSPITAL_COMMUNITY): Payer: Self-pay

## 2024-09-30 ENCOUNTER — Emergency Department (HOSPITAL_COMMUNITY)

## 2024-09-30 ENCOUNTER — Encounter (HOSPITAL_COMMUNITY): Payer: Self-pay

## 2024-09-30 ENCOUNTER — Emergency Department (HOSPITAL_COMMUNITY)
Admission: EM | Admit: 2024-09-30 | Discharge: 2024-09-30 | Disposition: A | Discharge status: Home / Self Care | Attending: Student in an Organized Health Care Education/Training Program | Admitting: Student in an Organized Health Care Education/Training Program

## 2024-09-30 ENCOUNTER — Other Ambulatory Visit: Payer: Self-pay

## 2024-09-30 DIAGNOSIS — W19XXXA Unspecified fall, initial encounter: Secondary | ICD-10-CM | POA: Insufficient documentation

## 2024-09-30 DIAGNOSIS — Y92019 Unspecified place in single-family (private) house as the place of occurrence of the external cause: Secondary | ICD-10-CM | POA: Insufficient documentation

## 2024-09-30 DIAGNOSIS — Z794 Long term (current) use of insulin: Secondary | ICD-10-CM | POA: Insufficient documentation

## 2024-09-30 DIAGNOSIS — Z7982 Long term (current) use of aspirin: Secondary | ICD-10-CM | POA: Diagnosis not present

## 2024-09-30 DIAGNOSIS — R42 Dizziness and giddiness: Secondary | ICD-10-CM | POA: Insufficient documentation

## 2024-09-30 LAB — CULTURE, BLOOD (ROUTINE X 2)
Culture: NO GROWTH
Culture: NO GROWTH
Special Requests: ADEQUATE
Special Requests: ADEQUATE

## 2024-09-30 LAB — CBG MONITORING, ED: Glucose-Capillary: 117 mg/dL — ABNORMAL HIGH (ref 70–99)

## 2024-09-30 NOTE — ED Triage Notes (Signed)
 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

## 2024-09-30 NOTE — ED Notes (Signed)
 Pt reports she was attempting to transfer to have a BM and lost balance. Pt states she wanted to make sure she was ok with the ringing in her ears. Pt observed moving self into sitting position with minimal assistance. Pt given drink and bag lunch at this time.

## 2024-09-30 NOTE — ED Provider Triage Note (Signed)
 Emergency Medicine Provider Triage Evaluation Note  Karen [name] , a 47 y.o. female  was evaluated in triage.  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  Was going to the bathroom and lost her balance was helped down slowly to the floor by her sister, but was on her stump.  Had some bleeding.  She did not hit her head no LOC.  She notes some minor discomfort to her stump, but is concerned since it was bleeding.  Review of Systems  Positive: Pain to left stump Negative: No chest pain or shortness of breath.  No headache  Physical Exam  BP (!) 131/58 (BP Location: Right Arm)   Pulse 76   Temp 98.4 F (36.9 C)   Resp 16   SpO2 94%  Gen:   Awake, no distress   Resp:  Normal effort  MSK:   Moves extremities without difficulty.  Bandaging taken down over left BKA.  Wound well-approximated.  Bandage did have some minor blood to it. Other:  No obvious trauma  Medical Decision Making  Medically screening exam initiated at 11:54 AM.  Appropriate orders placed.  [facility] was informed that the remainder of the evaluation will be completed by another provider, this initial triage assessment does not replace that evaluation, and the importance of remaining in the ED until their evaluation is complete.  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  The wound appears well-approximated, does not appear to be in distress.  No other injuries.  X-ray ordered   Neysa Caron PARAS, DO 09/30/24 1155

## 2024-09-30 NOTE — Discharge Instructions (Signed)
 Please return to the emergency department if you have any return of your symptoms.  Please follow-up for dialysis tomorrow as scheduled.

## 2024-09-30 NOTE — ED Provider Notes (Signed)
 [facility] AT [facility] Provider Note   CSN: [id number] Arrival date & time: 09/30/24  1121     Patient presents with: Karen [name] is a 48 y.o. female.   48 year old female brought to emergency department for evaluation s/p mechanical fall at home.  Patient reports that she got home from the hospital after undergoing an BKA and misstepped causing her to fall.  She is denying any injuries from the fall or any pain.  She does note some mild dizziness and fullness in her left ear that has been ongoing since before her discharge.  She is a dialysis patient and receive dialysis yesterday.  She is denying any head pain, neck pain, abdominal pain, or injuries from the fall.  Patient is denying any dizziness contributing to the fall and states it was purely mechanical as she is adjusting to the BKA   Fall       Prior to Admission medications   Medication Sig Start Date End Date Taking? Authorizing Provider  albuterol  (VENTOLIN  HFA) 108 (90 Base) MCG/ACT inhaler Inhale 1-2 puffs into the lungs every 6 (six) hours as needed for wheezing or shortness of breath.    [provider]  aspirin  EC 81 MG tablet Take 1 tablet (81 mg total) by mouth daily. Swallow whole. 02/26/24   Wyn Jackee VEAR Mickey., NP  B Complex-C-Folic Acid  (RENA-VITE PO) Take 1 tablet by mouth in the morning.    [provider]  brimonidine (ALPHAGAN) 0.2 % ophthalmic solution Place 1 drop into the right eye in the morning and at bedtime. 01/29/24   [provider]  cinacalcet  (SENSIPAR ) 30 MG tablet Take 90 mg by mouth daily with breakfast. After hemodialysis    [provider]  clopidogrel  (PLAVIX ) 75 MG tablet Take 1 tablet (75 mg total) by mouth daily. 03/01/24   Shlomo Wilbert SAUNDERS, MD  FOSRENOL  1000 MG PACK Take 1,000-2,000 mg by mouth See admin instructions. Take 2 packets (2000 mg) by mouth with each meal & take 1 packet (1000 mg) by mouth with  each snack 01/12/19   [provider]  gabapentin  (NEURONTIN ) 100 MG capsule Take 100-200 mg by mouth See admin instructions. Take 2 capsules (200 mg) by mouth every morning & take 1 capsule (100 mg) by mouth at bedtime. 03/12/21   [provider]  glucagon 1 MG injection Inject 1 mg into the muscle once as needed (low blood sugar). 09/29/24   Tobie Yetta HERO, MD  insulin  aspart (NOVOLOG ) 100 UNIT/ML injection Inject 0-10 Units into the skin See admin instructions. Via insulin  pump    [provider]  lidocaine  (LIDODERM ) 5 % Place 1 patch onto the skin daily as needed (for pain). 11/27/23   [provider]  methocarbamol (ROBAXIN) 500 MG tablet Take 1 tablet (500 mg total) by mouth every 6 (six) hours as needed for muscle spasms. 09/29/24   Howerter, Eva B, DO  metoprolol  tartrate (LOPRESSOR ) 25 MG tablet Take (12.5mg ) half tablet twice a day except on dialysis days Patient taking differently: Take 12.5 mg by mouth See admin instructions. Take (12.5mg ) half tablet twice a day except on dialysis days. Take 12.5mg  at night on dialysis. 07/21/24   Wyn Jackee VEAR Mickey., NP  Nutritional Supplements (,FEEDING SUPPLEMENT, PROSOURCE PLUS) liquid Take 30 mLs by mouth 2 (two) times daily between meals. 09/29/24   Tobie Yetta HERO, MD  Nutritional Supplements (FEEDING SUPPLEMENT, NEPRO CARB STEADY,) LIQD Take 237 mLs  by mouth as needed (missed meal during dialysis.). 09/29/24   Tobie Yetta HERO, MD  oxyCODONE -acetaminophen  (PERCOCET) 10-325 MG tablet Take 1 tablet by mouth every 4 (four) hours as needed for pain. 09/29/24 09/29/25  Patel, Pranav M, MD  pantoprazole (PROTONIX) 40 MG tablet Take 1 tablet (40 mg total) by mouth daily. 09/24/24 10/24/24  Samtani, Jai-Gurmukh, MD  polyethylene glycol powder (GLYCOLAX/MIRALAX) 17 GM/SCOOP powder Take 17 g by mouth 2 (two) times daily. Dissolve 1 capful (17g) in 4-8 ounces of liquid and take by mouth daily. 09/29/24   Tobie Yetta HERO, MD   senna-docusate (SENOKOT-S) 8.6-50 MG tablet Take 1 tablet by mouth 2 (two) times daily. 09/29/24   Tobie Yetta HERO, MD    Allergies: Furosemide and Heparin     Review of Systems  All other systems reviewed and are negative.   Updated Vital Signs BP (!) 158/72 (BP Location: Right Arm)   Pulse 70   Temp 98.3 F (36.8 C) (Oral)   Resp 18   SpO2 96%   Physical Exam Vitals and nursing note reviewed.  Constitutional:      General: She is not in acute distress. HENT:     Head: Atraumatic.  Eyes:     Conjunctiva/sclera: Conjunctivae normal.  Cardiovascular:     Rate and Rhythm: Normal rate.  Pulmonary:     Effort: Pulmonary effort is normal.     Breath sounds: Normal breath sounds.  Abdominal:     Palpations: Abdomen is soft.  Musculoskeletal:     Comments: BKA [name] bleeding or drainage  Skin:    General: Skin is warm.     Capillary Refill: Capillary refill takes less than 2 seconds.  Neurological:     General: [name] focal deficit present.     Mental Status: She is alert and oriented to person, place, and time.     (all labs ordered are listed, but only abnormal results are displayed) Labs Reviewed  CBG MONITORING, ED - Abnormal; Notable for the following components:      Result Value   Glucose-Capillary 117 (*)    All other components within normal limits    EKG: None  Radiology: DG Knee 2 Views Left Result Date: 09/30/2024 CLINICAL DATA:  Fall.  Recent below-knee amputation. EXAM: LEFT KNEE - 1-2 VIEW COMPARISON:  None Available. FINDINGS: Evidence of patient's recent below-knee amputation. Skin staples over the distal soft tissues of the amputation. Underlying bony structures are normal. [name] evidence of fracture or dislocation. [name] focal lytic or sclerotic lesion. [name] air within the soft tissues. IMPRESSION: 1. [name] acute findings. 2. Evidence of patient's recent below-knee amputation. Electronically Signed   By: Toribio Agreste M.D.   On: 09/30/2024 12:43   DG Abd  Portable 1V Result Date: 09/29/2024 CLINICAL DATA:  Constipation. EXAM: PORTABLE ABDOMEN - 1 VIEW COMPARISON:  CT 09/17/2024 FINDINGS: Peritoneal dialysis catheter tip in the midline pelvis. Moderate volume of stool in the colon. [name] small bowel distension or evidence of obstruction. Cholecystectomy clips in the right upper quadrant. Chain sutures at the left lung base. IMPRESSION: Moderate colonic stool burden. [name] bowel obstruction. Electronically Signed   By: Andrea Gasman M.D.   On: 09/29/2024 15:05     Procedures   Medications Ordered in the ED - [name] data to display                                  Medical  Decision Making 48 year old female presenting to the emergency room for evaluation s/p a mechanical fall at home.  An x-ray of the left knee was performed while in triage and this was unremarkable.  She has [name] active bleeding from the postop site.  She did report some intermittent dizziness, however does not feel as though this played a role in her fall today.  She reports the fall was mechanical and secondary to her getting used to her BKA.  We did offer to run a BMP given her history of dialysis and she has refused testing at this time.  She states she had a BMP checked yesterday and is scheduled to have dialysis again tomorrow.  We did offer her some food and after eating she reported that the dizziness had resolved and she felt good.  She continues to have [name] complaints and denies any injuries from the fall.  We did get her up and she was able to stand without pain or dizziness.  Patient requesting to be discharged at this time.  She agreed to follow-up for dialysis tomorrow as scheduled.  She had [name] further questions and we discussed return precautions.     Final diagnoses:  Fall, initial encounter    ED Discharge Orders     None          Karen No, DO 09/30/24 1840

## 2024-09-30 NOTE — Progress Notes (Addendum)
 Late note entry 1014am 09/30/24  D/c noted. Contacted pt out-pt HD clinic, [facility], to inform that pt has d/c and will be resuming back at clinic. D/c summ and last note faxed over at this time. No further support is needed.   Yvan [facility] [phone]

## 2024-10-05 ENCOUNTER — Ambulatory Visit (INDEPENDENT_AMBULATORY_CARE_PROVIDER_SITE_OTHER): Admitting: Podiatry

## 2024-10-05 ENCOUNTER — Encounter: Payer: Self-pay | Admitting: Podiatry

## 2024-10-05 DIAGNOSIS — M86171 Other acute osteomyelitis, right ankle and foot: Secondary | ICD-10-CM

## 2024-10-05 DIAGNOSIS — E1140 Type 2 diabetes mellitus with diabetic neuropathy, unspecified: Secondary | ICD-10-CM

## 2024-10-05 DIAGNOSIS — E1149 Type 2 diabetes mellitus with other diabetic neurological complication: Secondary | ICD-10-CM

## 2024-10-05 DIAGNOSIS — Z9889 Other specified postprocedural states: Secondary | ICD-10-CM

## 2024-10-05 NOTE — Progress Notes (Signed)
  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 6 pain. IDDM A1C 6.5.    DOS: 09/10/2024 Procedure: 1. Amputation of right third toe at PIPJ level 2. Transmetatarsal amputation of left foot  48 y.o. female seen for post op check.  She is now s/p L BKA.  Following up on the right third toe amputation site.  Denies any issues on the right foot.  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  Moving to [place]  tomorrow.  Review of Systems: Negative except as noted in the HPI. Denies N/V/F/Ch.   Objective:   Constitutional Well developed. Well nourished.  Vascular Foot warm and well perfused. Capillary refill normal to all digits.   No calf pain with palpation  Neurologic Normal speech. Oriented to person, place, and time. Epicritic sensation diminished to bilateral forefoot  Dermatologic Right foot amputation site at the third toe PIPJ level is doing very well healing well with no dehiscence and no drainage no erythema and no necrosis, no maceration. Dorsal wound healing with eschar overlying.      Orthopedic: Status post left foot transmetatarsal amputation, status post right partial third toe amputation   Radiographs: Right foot postop: 1. Interval surgical amputation of the third middle and distal phalanges. 2. Stable postsurgical changes involving the fifth metatarsal. 3. Stable chronic changes of the midfoot. 4. Vascular calcifications.  Left foot postop: 1. Status post amputation of all five toes at the mid metatarsal level. 2. Vascular calcifications.    Pathology:  A. 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 Acute osteomyelitis of toe, right (HCC)   2. Diabetic neuropathy with neurologic complication (HCC)   3. 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  3 weeks  s/p left foot transmetatarsal amputation and partial right third toe amputation.  s/p L BKA with vascular.  - Continues to be healing well by the third toe amputation site no evidence of residual infection there are no dehiscence -  Recommend triple antibiotic ointment and Band-Aid dressing to the dorsal superficial wound on the right foot until fully healed.  No dressing needed for the third toe amputation site at this point in time okay to wash the foot -XR: Expected postop changes -WB Status:Weightbearing as tolerated to right foot in postop shoe -Sutures: Previously removed -Medications/ABX: Abx per ID, appreciate recs, none recommended from my perspective for the right foot -Dressing:   Antibiotic ointment and Band-Aid dressing to the right foot superficial dorsal ulceration which is nearly fully healed at this time - F/u Plan: Patient moving to [place]  and will follow-up with podiatry practice in [place], referral placed and she is in contact with them        Karen [name], DPM [facility] / [facility]

## 2024-10-07 ENCOUNTER — Encounter: Admitting: Podiatry

## 2024-10-07 ENCOUNTER — Other Ambulatory Visit: Payer: Self-pay

## 2024-10-08 MED ORDER — CLOPIDOGREL BISULFATE 75 MG PO TABS: 75.0000 mg | ORAL_TABLET | Freq: Every day | ORAL | 2 refills | Status: AC

## 2024-10-18 ENCOUNTER — Other Ambulatory Visit: Payer: Self-pay

## 2024-10-20 ENCOUNTER — Encounter

## 2024-10-20 ENCOUNTER — Encounter (HOSPITAL_COMMUNITY)

## 2024-10-20 MED ORDER — METOPROLOL TARTRATE 25 MG PO TABS: ORAL_TABLET | ORAL | 1 refills | Status: AC
